# Patient Record
Sex: Male | Born: 1975 | Race: Black or African American | Hispanic: No | Marital: Married | Smoking: Current some day smoker
Health system: Southern US, Community
[De-identification: ages and names within clinical notes are randomized; demographics above are authoritative.]

## PROBLEM LIST (undated history)

## (undated) DIAGNOSIS — M1990 Unspecified osteoarthritis, unspecified site: Secondary | ICD-10-CM

## (undated) DIAGNOSIS — I10 Essential (primary) hypertension: Secondary | ICD-10-CM

## (undated) DIAGNOSIS — N189 Chronic kidney disease, unspecified: Secondary | ICD-10-CM

## (undated) DIAGNOSIS — K219 Gastro-esophageal reflux disease without esophagitis: Secondary | ICD-10-CM

## (undated) DIAGNOSIS — R7303 Prediabetes: Secondary | ICD-10-CM

## (undated) DIAGNOSIS — G4730 Sleep apnea, unspecified: Secondary | ICD-10-CM

## (undated) DIAGNOSIS — B019 Varicella without complication: Secondary | ICD-10-CM

## (undated) HISTORY — PX: OTHER SURGICAL HISTORY: SHX169

## (undated) HISTORY — DX: Varicella without complication: B01.9

## (undated) HISTORY — PX: ESOPHAGOGASTRODUODENOSCOPY: SHX1529

## (undated) HISTORY — DX: Sleep apnea, unspecified: G47.30

## (undated) HISTORY — PX: COLONOSCOPY W/ BIOPSIES: SHX1374

---

## 1998-07-06 ENCOUNTER — Emergency Department (HOSPITAL_COMMUNITY): Admission: EM | Admit: 1998-07-06 | Discharge: 1998-07-06 | Payer: Self-pay | Admitting: Internal Medicine

## 2000-01-05 ENCOUNTER — Emergency Department (HOSPITAL_COMMUNITY): Admission: EM | Admit: 2000-01-05 | Discharge: 2000-01-05 | Payer: Self-pay | Admitting: *Deleted

## 2000-03-01 ENCOUNTER — Emergency Department (HOSPITAL_COMMUNITY): Admission: EM | Admit: 2000-03-01 | Discharge: 2000-03-01 | Payer: Self-pay | Admitting: Emergency Medicine

## 2000-03-01 ENCOUNTER — Encounter: Payer: Self-pay | Admitting: Emergency Medicine

## 2004-06-01 ENCOUNTER — Emergency Department (HOSPITAL_COMMUNITY): Admission: EM | Admit: 2004-06-01 | Discharge: 2004-06-01 | Payer: Self-pay | Admitting: Family Medicine

## 2005-01-01 ENCOUNTER — Ambulatory Visit (HOSPITAL_COMMUNITY): Admission: RE | Admit: 2005-01-01 | Discharge: 2005-01-01 | Payer: Self-pay | Admitting: Family Medicine

## 2005-01-01 ENCOUNTER — Emergency Department (HOSPITAL_COMMUNITY): Admission: EM | Admit: 2005-01-01 | Discharge: 2005-01-01 | Payer: Self-pay | Admitting: Family Medicine

## 2005-06-29 ENCOUNTER — Emergency Department (HOSPITAL_COMMUNITY): Admission: EM | Admit: 2005-06-29 | Discharge: 2005-06-30 | Payer: Self-pay | Admitting: Emergency Medicine

## 2005-10-30 ENCOUNTER — Emergency Department (HOSPITAL_COMMUNITY): Admission: EM | Admit: 2005-10-30 | Discharge: 2005-10-30 | Payer: Self-pay | Admitting: Family Medicine

## 2006-09-30 ENCOUNTER — Emergency Department (HOSPITAL_COMMUNITY): Admission: EM | Admit: 2006-09-30 | Discharge: 2006-09-30 | Payer: Self-pay | Admitting: Family Medicine

## 2006-11-23 ENCOUNTER — Emergency Department (HOSPITAL_COMMUNITY): Admission: EM | Admit: 2006-11-23 | Discharge: 2006-11-23 | Payer: Self-pay | Admitting: Family Medicine

## 2006-12-13 ENCOUNTER — Emergency Department (HOSPITAL_COMMUNITY): Admission: EM | Admit: 2006-12-13 | Discharge: 2006-12-13 | Payer: Self-pay | Admitting: Family Medicine

## 2007-01-08 ENCOUNTER — Ambulatory Visit: Payer: Self-pay | Admitting: Internal Medicine

## 2007-01-09 ENCOUNTER — Ambulatory Visit: Payer: Self-pay | Admitting: *Deleted

## 2007-01-19 ENCOUNTER — Ambulatory Visit: Payer: Self-pay | Admitting: Internal Medicine

## 2007-02-05 ENCOUNTER — Ambulatory Visit: Payer: Self-pay | Admitting: Internal Medicine

## 2007-02-23 ENCOUNTER — Ambulatory Visit: Payer: Self-pay | Admitting: Internal Medicine

## 2007-03-29 ENCOUNTER — Ambulatory Visit: Payer: Self-pay | Admitting: Internal Medicine

## 2007-05-14 ENCOUNTER — Ambulatory Visit: Payer: Self-pay | Admitting: Internal Medicine

## 2007-07-19 ENCOUNTER — Ambulatory Visit: Payer: Self-pay | Admitting: Family Medicine

## 2007-08-07 DIAGNOSIS — I10 Essential (primary) hypertension: Secondary | ICD-10-CM | POA: Insufficient documentation

## 2007-08-07 DIAGNOSIS — R519 Headache, unspecified: Secondary | ICD-10-CM | POA: Insufficient documentation

## 2007-08-07 DIAGNOSIS — R51 Headache: Secondary | ICD-10-CM | POA: Insufficient documentation

## 2007-08-22 ENCOUNTER — Encounter (INDEPENDENT_AMBULATORY_CARE_PROVIDER_SITE_OTHER): Payer: Self-pay | Admitting: *Deleted

## 2007-11-05 ENCOUNTER — Telehealth (INDEPENDENT_AMBULATORY_CARE_PROVIDER_SITE_OTHER): Payer: Self-pay | Admitting: Nurse Practitioner

## 2007-11-06 ENCOUNTER — Emergency Department (HOSPITAL_COMMUNITY): Admission: EM | Admit: 2007-11-06 | Discharge: 2007-11-06 | Payer: Self-pay | Admitting: Emergency Medicine

## 2007-11-20 ENCOUNTER — Emergency Department (HOSPITAL_COMMUNITY): Admission: EM | Admit: 2007-11-20 | Discharge: 2007-11-20 | Payer: Self-pay | Admitting: Emergency Medicine

## 2008-02-26 ENCOUNTER — Emergency Department (HOSPITAL_COMMUNITY): Admission: EM | Admit: 2008-02-26 | Discharge: 2008-02-26 | Payer: Self-pay | Admitting: Family Medicine

## 2008-03-25 ENCOUNTER — Telehealth (INDEPENDENT_AMBULATORY_CARE_PROVIDER_SITE_OTHER): Payer: Self-pay | Admitting: Family Medicine

## 2008-05-12 ENCOUNTER — Emergency Department (HOSPITAL_COMMUNITY): Admission: EM | Admit: 2008-05-12 | Discharge: 2008-05-13 | Payer: Self-pay | Admitting: Emergency Medicine

## 2008-05-13 ENCOUNTER — Emergency Department (HOSPITAL_COMMUNITY): Admission: EM | Admit: 2008-05-13 | Discharge: 2008-05-13 | Payer: Self-pay | Admitting: Emergency Medicine

## 2008-09-25 ENCOUNTER — Telehealth (INDEPENDENT_AMBULATORY_CARE_PROVIDER_SITE_OTHER): Payer: Self-pay | Admitting: Internal Medicine

## 2008-10-07 ENCOUNTER — Ambulatory Visit: Payer: Self-pay | Admitting: Internal Medicine

## 2008-11-11 ENCOUNTER — Ambulatory Visit: Payer: Self-pay | Admitting: Internal Medicine

## 2008-11-25 ENCOUNTER — Ambulatory Visit: Payer: Self-pay | Admitting: Internal Medicine

## 2008-11-25 LAB — CONVERTED CEMR LAB
ALT: 26 units/L (ref 0–53)
AST: 21 units/L (ref 0–37)
Albumin: 4.3 g/dL (ref 3.5–5.2)
Alkaline Phosphatase: 84 units/L (ref 39–117)
BUN: 12 mg/dL (ref 6–23)
Basophils Absolute: 0 10*3/uL (ref 0.0–0.1)
Basophils Relative: 0 % (ref 0–1)
CO2: 23 meq/L (ref 19–32)
Calcium: 9.3 mg/dL (ref 8.4–10.5)
Chloride: 103 meq/L (ref 96–112)
Cholesterol: 165 mg/dL (ref 0–200)
Creatinine, Ser: 0.9 mg/dL (ref 0.40–1.50)
Eosinophils Absolute: 0.2 10*3/uL (ref 0.0–0.7)
Eosinophils Relative: 3 % (ref 0–5)
Glucose, Bld: 89 mg/dL (ref 70–99)
HCT: 41.7 % (ref 39.0–52.0)
HDL: 38 mg/dL — ABNORMAL LOW (ref >39)
Hemoglobin: 13.1 g/dL (ref 13.0–17.0)
LDL Cholesterol: 113 mg/dL — ABNORMAL HIGH (ref 0–99)
Lymphocytes Relative: 39 % (ref 12–46)
Lymphs Abs: 2.7 10*3/uL (ref 0.7–4.0)
MCHC: 31.4 g/dL (ref 30.0–36.0)
MCV: 82.7 fL (ref 78.0–100.0)
Monocytes Absolute: 0.6 10*3/uL (ref 0.1–1.0)
Monocytes Relative: 8 % (ref 3–12)
Neutro Abs: 3.4 10*3/uL (ref 1.7–7.7)
Neutrophils Relative %: 49 % (ref 43–77)
Platelets: 383 10*3/uL (ref 150–400)
Potassium: 3.9 meq/L (ref 3.5–5.3)
RBC: 5.04 M/uL (ref 4.22–5.81)
RDW: 15.3 % (ref 11.5–15.5)
Sodium: 142 meq/L (ref 135–145)
Total Bilirubin: 0.3 mg/dL (ref 0.3–1.2)
Total CHOL/HDL Ratio: 4.3
Total Protein: 7.6 g/dL (ref 6.0–8.3)
Triglycerides: 71 mg/dL (ref <150)
VLDL: 14 mg/dL (ref 0–40)
WBC: 7 10*3/uL (ref 4.0–10.5)

## 2008-12-28 ENCOUNTER — Encounter (INDEPENDENT_AMBULATORY_CARE_PROVIDER_SITE_OTHER): Payer: Self-pay | Admitting: Internal Medicine

## 2008-12-28 DIAGNOSIS — G4733 Obstructive sleep apnea (adult) (pediatric): Secondary | ICD-10-CM | POA: Insufficient documentation

## 2009-01-09 ENCOUNTER — Encounter (INDEPENDENT_AMBULATORY_CARE_PROVIDER_SITE_OTHER): Payer: Self-pay | Admitting: Internal Medicine

## 2009-01-20 ENCOUNTER — Encounter (INDEPENDENT_AMBULATORY_CARE_PROVIDER_SITE_OTHER): Payer: Self-pay | Admitting: Family Medicine

## 2009-01-21 ENCOUNTER — Emergency Department (HOSPITAL_COMMUNITY): Admission: EM | Admit: 2009-01-21 | Discharge: 2009-01-21 | Payer: Self-pay | Admitting: Family Medicine

## 2009-02-02 ENCOUNTER — Telehealth (INDEPENDENT_AMBULATORY_CARE_PROVIDER_SITE_OTHER): Payer: Self-pay | Admitting: Internal Medicine

## 2009-02-26 ENCOUNTER — Ambulatory Visit: Payer: Self-pay | Admitting: Internal Medicine

## 2009-03-04 ENCOUNTER — Encounter (INDEPENDENT_AMBULATORY_CARE_PROVIDER_SITE_OTHER): Payer: Self-pay | Admitting: Internal Medicine

## 2009-03-10 ENCOUNTER — Telehealth (INDEPENDENT_AMBULATORY_CARE_PROVIDER_SITE_OTHER): Payer: Self-pay | Admitting: Internal Medicine

## 2009-05-11 ENCOUNTER — Encounter (INDEPENDENT_AMBULATORY_CARE_PROVIDER_SITE_OTHER): Payer: Self-pay | Admitting: *Deleted

## 2009-08-03 ENCOUNTER — Emergency Department (HOSPITAL_COMMUNITY): Admission: EM | Admit: 2009-08-03 | Discharge: 2009-08-03 | Payer: Self-pay | Admitting: Emergency Medicine

## 2009-08-31 ENCOUNTER — Emergency Department (HOSPITAL_COMMUNITY): Admission: EM | Admit: 2009-08-31 | Discharge: 2009-08-31 | Payer: Self-pay | Admitting: Emergency Medicine

## 2009-11-07 ENCOUNTER — Emergency Department (HOSPITAL_COMMUNITY): Admission: EM | Admit: 2009-11-07 | Discharge: 2009-11-07 | Payer: Self-pay | Admitting: Family Medicine

## 2010-10-30 ENCOUNTER — Emergency Department (HOSPITAL_COMMUNITY)
Admission: EM | Admit: 2010-10-30 | Discharge: 2010-10-30 | Payer: Self-pay | Source: Home / Self Care | Admitting: Family Medicine

## 2010-12-22 ENCOUNTER — Telehealth (INDEPENDENT_AMBULATORY_CARE_PROVIDER_SITE_OTHER): Payer: Self-pay | Admitting: Internal Medicine

## 2010-12-29 ENCOUNTER — Encounter (INDEPENDENT_AMBULATORY_CARE_PROVIDER_SITE_OTHER): Payer: Self-pay | Admitting: Internal Medicine

## 2010-12-30 ENCOUNTER — Telehealth (INDEPENDENT_AMBULATORY_CARE_PROVIDER_SITE_OTHER): Payer: Self-pay | Admitting: Internal Medicine

## 2011-01-06 NOTE — Letter (Signed)
Summary: FAXED RECORDS TO [facility]  FAXED RECORDS TO [facility]   Imported By: Arta Bruce 12/29/2010 15:07:13  _____________________________________________________________________  External Attachment:    Type:   Image     Comment:   External Document

## 2011-01-06 NOTE — Progress Notes (Signed)
Summary: Med refills  Phone Note From Pharmacy   Summary of Call: Request for refills of meds. Please notify [facility]--pt. has not been seen since 02/2009 and was told last February he needed an OV.  Please call pt. and let him know until he had an appt. scheduled, we will not refill his meds. ONce the appt. is made, his meds can be refilled until then. Initial call taken by: Julieanne Manson MD,  December 22, 2010 7:47 AM  Follow-up for Phone Call        [phone] will not accept incoming calls, [phone] no longer works there. [phone] Left message on answer machine for pt. to return call. Pharm notified pt will need to schedule appt. then will refill meds to appt. date. Gaylyn Cheers RN  December 23, 2010 12:52 PM       Additional Follow-up for Phone Call Additional follow up Details #1::        PT CALLED AND IS SCHEDULED ON MARCH 6 @ 11:15 Additional Follow-up by: Armenia Shannon,  December 29, 2010 8:55 AM

## 2011-01-06 NOTE — Progress Notes (Signed)
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 please call Additional Follow-up by: Arta Bruce,  December 30, 2010 9:22 AM    Additional Follow-up for Phone Call Additional follow up Details #2::    Appointment scheduled 02/08/11 - Faxed refills for 30 days with 1 refill for pt. meds per Dr. Renne Crigler instructions on 12/22/10.  Pt. did not answer phone -- unable to leave message.  Dutch Quint RN  December 30, 2010 9:43 AM   Pt. notified of refills, appt. confirmed.  Advised that if he does not show for appointment, no more refills.  Verbalized understanding and agreement.  Dutch Quint RN  December 31, 2010 4:35 PM   Prescriptions: LISINOPRIL 20 MG TABS (LISINOPRIL) 1 tab by mouth daily with Lisinopril/hctz tab  #30 x 1   Entered by:   Dutch Quint RN   Authorized by:   Julieanne Manson MD   Signed by:   Dutch Quint RN on 12/30/2010   Method used:   Faxed to ...       [facility] - Pharmac (retail)       [address] [place], [place]  [address]       Ph: [phone]       Fax: [phone]   RxID:   [id number] NORVASC 10 MG  TABS (AMLODIPINE BESYLATE) Take 1 tablet by mouth once a day **needs office visit**  #30 x 1   Entered by:   Dutch Quint RN   Authorized by:   Julieanne Manson MD   Signed by:   Dutch Quint RN on 12/30/2010   Method used:   Faxed to ...       [facility] - Pharmac (retail)       [address] [place], [place]  [address]       Ph: [phone]       Fax:  [phone]   RxID:   [id number] LISINOPRIL-HYDROCHLOROTHIAZIDE 20-25 MG  TABS (LISINOPRIL-HYDROCHLOROTHIAZIDE) Take 1 tablet by mouth once a day with another 20 mg of Lisinopril**needs an office visit**  #30 x 1   Entered by:   Dutch Quint RN   Authorized by:   Julieanne Manson MD   Signed by:   Dutch Quint RN on 12/30/2010   Method used:   Faxed to ...       [facility] - Pharmac (retail)       [address] [place], [place]  [address]       Ph: [phone]       Fax: [phone]   RxID:   [id number]

## 2011-02-08 ENCOUNTER — Encounter (INDEPENDENT_AMBULATORY_CARE_PROVIDER_SITE_OTHER): Payer: Self-pay | Admitting: Internal Medicine

## 2011-02-08 ENCOUNTER — Telehealth (INDEPENDENT_AMBULATORY_CARE_PROVIDER_SITE_OTHER): Payer: Self-pay | Admitting: Internal Medicine

## 2011-02-08 ENCOUNTER — Encounter: Payer: Self-pay | Admitting: Internal Medicine

## 2011-02-08 DIAGNOSIS — R1310 Dysphagia, unspecified: Secondary | ICD-10-CM | POA: Insufficient documentation

## 2011-02-15 LAB — POCT RAPID STREP A (OFFICE): Streptococcus, Group A Screen (Direct): NEGATIVE

## 2011-02-15 NOTE — Progress Notes (Signed)
Summary: Sleep study referral  Phone Note Outgoing Call   Summary of Call: Nora--sleep study--regular please, not split night. Initial call taken by: Julieanne Manson MD,  February 08, 2011 12:30 PM  Follow-up for Phone Call        SENT REFERRAL TO [facility] # [phone] WAITING FOR AN APPT  Follow-up by: Cheryll Dessert,  February 10, 2011 5:20 PM

## 2011-02-15 NOTE — Assessment & Plan Note (Signed)
Summary: OV   Vital Signs:  Patient profile:   35 year old male Weight:      316.7 pounds BSA:     2.59 Temp:     98.1 degrees F oral Pulse rate:   97 / minute Pulse rhythm:   regular Resp:     20 per minute BP sitting:   157 / 94  (left arm) Cuff size:   large  Vitals Entered By: Gaylyn Cheers RN (February 08, 2011 11:30 AM) CC: F/U BP, having problems swallowing, food gets caught in throat has occasional vomiting because food will not go down Pain Assessment Patient in pain? no       Does patient need assistance? Functional Status Self care Ambulation Normal   CC:  F/U BP, having problems swallowing, and food gets caught in throat has occasional vomiting because food will not go down.  History of Present Illness: 1.  Hypertension:  was not taking medications the way he was supposed to.  On and off meds--runs out of them and then does not fill for a while.  Interested in doing better--states "just laziness".  Not exercising.  Not eating the way he should with work schedule.    2.  Snoring and concerns for sleep apnea:  Never went for sleep study.  Has gained weight since last here.    3.  Dysphagia:  Past 2 years.  First thing in morning with dry throat in particular, food gets caught.  Needs to drink fluid to move along.  Has had to vomit food back up and chew up a bit more.  Generally occurs with meat.  Occasional acid in throat--but rare.  Rare heartburn symptoms.  Does have a bad taste in mouth every morning.  Nonsmoker--quit in 1997.  Rare alcohol.  Was taking Goody Powders regularly for headaches when his bp was high.  Used two times a day.  Stopped after back on some of bp meds.  Has used Ibuprofen as well.  Drinks sweet tea 3 times weekly.  Sodas 3-4 times weekly.  Eats a lot of onions, some tomatoes.  Sometiimes lies down after eating.    Current Medications (verified): 1)  Lisinopril-Hydrochlorothiazide 20-25 Mg  Tabs (Lisinopril-Hydrochlorothiazide) .... Take 1 Tablet  By Mouth Once A Day With Another 20 Mg of Lisinopril**needs An Office Visit** 2)  Norvasc 10 Mg  Tabs (Amlodipine Besylate) .... Take 1 Tablet By Mouth Once A Day **needs Office Visit** 3)  Lisinopril 20 Mg Tabs (Lisinopril) .Marland Kitchen.. 1 Tab By Mouth Daily With Lisinopril/hctz Tab  Allergies (verified): No Known Drug Allergies  Social History: Reviewed history from 02/26/2009 and no changes required. 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 Lives at home with mother. Recently divorced--kids live with Xwife. Very involved with children Tobacco:  never Alcohol use-yes--rare mixed drink Drug use-no--MJ in youth.  Nothing in many years.  Stopped 1997  Physical Exam  General:  Obese, NAD Lungs:  Normal respiratory effort, chest expands symmetrically. Lungs are clear to auscultation, no crackles or wheezes. Heart:  Normal rate and regular rhythm. S1 and S2 normal without gallop, murmur, click, rub or other extra sounds.  Radail pulses normal and equal Abdomen:  soft, non-tender, normal bowel sounds, no hepatomegaly, and no splenomegaly.     Impression & Recommendations:  Problem # 1:  SNORING (ICD-786.09)  Rerefer for sleep study His updated medication list for this problem includes:    Lisinopril-hydrochlorothiazide 20-25 Mg Tabs (Lisinopril-hydrochlorothiazide) .Marland Kitchen... Take 1 tablet by mouth once a  day with another 20 mg of lisinopril**needs an office visit**    Lisinopril 20 Mg Tabs (Lisinopril) .Marland Kitchen... 1 tab by mouth daily with lisinopril/hctz tab  Orders: Sleep Disorder Referral (Sleep Disorder)  Problem # 2:  DYSPHAGIA UNSPECIFIED (ICD-787.20) Start Ranitidine GERD precautions discussed  Problem # 3:  OBESITY (ICD-278.00) Encouraged lifestyle changes  Problem # 4:  HYPERTENSION (ICD-401.9) Restart meds regularly--check labs at follow up His updated medication list for this problem includes:    Lisinopril-hydrochlorothiazide 20-25 Mg Tabs (Lisinopril-hydrochlorothiazide) .Marland Kitchen... Take 1  tablet by mouth once a day with another 20 mg of lisinopril**needs an office visit**    Norvasc 10 Mg Tabs (Amlodipine besylate) .Marland Kitchen... Take 1 tablet by mouth once a day **needs office visit**    Lisinopril 20 Mg Tabs (Lisinopril) .Marland Kitchen... 1 tab by mouth daily with lisinopril/hctz tab  Complete Medication List: 1)  Lisinopril-hydrochlorothiazide 20-25 Mg Tabs (Lisinopril-hydrochlorothiazide) .... Take 1 tablet by mouth once a day with another 20 mg of lisinopril**needs an office visit** 2)  Norvasc 10 Mg Tabs (Amlodipine besylate) .... Take 1 tablet by mouth once a day **needs office visit** 3)  Lisinopril 20 Mg Tabs (Lisinopril) .Marland Kitchen.. 1 tab by mouth daily with lisinopril/hctz tab 4)  Ranitidine Hcl 150 Mg Tabs (Ranitidine hcl) .Marland Kitchen.. 1 tab by mouth two times a day 1/2 hour before meals  Patient Instructions: 1)  Follow up with Dr. Delrae Alfred in 2 months --bp and dysphagia, snoring 2)  Elevate Head of bed Prescriptions: RANITIDINE HCL 150 MG TABS (RANITIDINE HCL) 1 tab by mouth two times a day 1/2 hour before meals  #60 x 3   Entered and Authorized by:   Julieanne Manson MD   Signed by:   Julieanne Manson MD on 02/08/2011   Method used:   Faxed to ...       [facility] - Pharmac (retail)       [address] [place], [place]  [address]       Ph: [phone]       Fax: [phone]   RxID:   [id number] LISINOPRIL 20 MG TABS (LISINOPRIL) 1 tab by mouth daily with Lisinopril/hctz tab  #30 x 11   Entered and Authorized by:   Julieanne Manson MD   Signed by:   Julieanne Manson MD on 02/08/2011   Method used:   Faxed to ...       [facility] - Pharmac (retail)       [address] [place], [place]  [address]       Ph: [phone]       Fax: [phone]   RxID:   [id number] NORVASC 10 MG  TABS (AMLODIPINE BESYLATE) Take 1 tablet by mouth once a day **needs office visit**  #30 x 11   Entered and Authorized  by:   Julieanne Manson MD   Signed by:   Julieanne Manson MD on 02/08/2011   Method used:   Faxed to ...       [facility] - Pharmac (retail)       [address] [place], [place]  [address]       Ph: [phone]       Fax: [phone]   RxID:   [id number] LISINOPRIL-HYDROCHLOROTHIAZIDE 20-25 MG  TABS (LISINOPRIL-HYDROCHLOROTHIAZIDE) Take 1 tablet by mouth once a day with another 20 mg of Lisinopril**needs an office visit**  #  30 x 11   Entered and Authorized by:   Julieanne Manson MD   Signed by:   Julieanne Manson MD on 02/08/2011   Method used:   Faxed to ...       [facility] - Pharmac (retail)       [address] [place], [place]  [address]       Ph: [phone]       Fax: [phone]   RxID:   [id number]    Orders Added: 1)  Est. Patient Level IV [[address]] 2)  Sleep Disorder Referral [Sleep Disorder]

## 2011-02-21 ENCOUNTER — Ambulatory Visit (HOSPITAL_BASED_OUTPATIENT_CLINIC_OR_DEPARTMENT_OTHER): Payer: Self-pay

## 2011-03-11 LAB — POCT RAPID STREP A (OFFICE): Streptococcus, Group A Screen (Direct): NEGATIVE

## 2011-03-11 LAB — STREP A DNA PROBE: Group A Strep Probe: NEGATIVE

## 2011-03-22 LAB — GC/CHLAMYDIA PROBE AMP, GENITAL: GC Probe Amp, Genital: NEGATIVE

## 2011-03-25 ENCOUNTER — Ambulatory Visit (HOSPITAL_BASED_OUTPATIENT_CLINIC_OR_DEPARTMENT_OTHER): Payer: Self-pay | Attending: Internal Medicine

## 2011-03-25 DIAGNOSIS — G4733 Obstructive sleep apnea (adult) (pediatric): Secondary | ICD-10-CM | POA: Insufficient documentation

## 2011-03-26 DIAGNOSIS — G4733 Obstructive sleep apnea (adult) (pediatric): Secondary | ICD-10-CM

## 2011-03-26 DIAGNOSIS — G4737 Central sleep apnea in conditions classified elsewhere: Secondary | ICD-10-CM

## 2011-03-28 NOTE — Procedures (Signed)
NAME:  Robert Cruz, CASPERS NO.:  [id number]  MEDICAL RECORD NO.:  [id number]         PATIENT TYPE:  OUT  LOCATION:  SLEEP CENTER                 FACILITY:  [facility]  PHYSICIAN:  Clinton D. Maple Hudson, MD, FCCP, FACPDATE OF BIRTH:  08/12/76  DATE OF STUDY:  03/25/2011                           NOCTURNAL POLYSOMNOGRAM  REFERRING PHYSICIAN:  Marcene Duos, M.D.  INDICATION FOR STUDY:  Hypersomnia with sleep apnea.  EPWORTH SLEEPINESS SCORE:  5/24.  BMI 36.9.  Weight 280 pounds.  Height 73 inches.  Neck 18.5 inches.  MEDICATIONS:  Home medications are charted and reviewed.  SLEEP ARCHITECTURE:  Split study protocol.  During the diagnostic phase, total sleep time 119.5 minutes with sleep efficiency 93%.  Stage I was 5.4%, stage II 85.4%, stage III absent, REM 9.2% of total sleep time. Sleep latency 3 minutes, REM latency 80.5 minutes, awake after sleep onset 6.5 minutes, arousal index 28.1.  BEDTIME MEDICATION:  None.  RESPIRATORY DATA:  Split study protocol.  Apnea-hypopnea index (AHI) 104.4 per hour.  A total of 208 events was scored including 83 obstructive apneas, 4 central apneas, 5 mixed apneas, 116 hypopneas. Events were not positional.  REM AHI 109.1 per hour.  CPAP was titrated to 20 CWP, AHI 0 per hour.  He wore a medium ResMed Mirage Quattro full- face mask with heated humidifier.  OXYGEN DATA:  Moderately loud snoring before CPAP with oxygen desaturation to a nadir of 66% on room air.  With CPAP titration, mean oxygen saturation held 95.4% on room air and snoring was prevented.  CARDIAC DATA:  Normal sinus rhythm.  MOVEMENT-PARASOMNIA:  No significant movement disturbance.  Bathroom x1.  IMPRESSIONS-RECOMMENDATIONS: 1. Unremarkable sleep architecture for sleep center environment.  The     patient requested to end the study at 3:30 a.m., so he could go to     work.  This can be discussed as part of his education about good     sleep hygiene. 2.  Severe obstructive sleep apnea/hypopnea syndrome, AHI 104.4 per     hour with non-positional events, moderate snoring and oxygen     desaturation to a nadir of 66% on room air. 3. Successful continuous positive airway pressure titration to 20 CWP,     AHI 0 per hour.  He wore a medium     ResMed Mirage Quattro full-face mask with heated humidifier.     Snoring was prevented and oxygenation normalized.     Clinton D. Maple Hudson, MD, [facility], FACP Diplomate, Biomedical engineer of Sleep Medicine Electronically Signed    CDY/MEDQ  D:  03/26/2011 12:05:44  T:  03/26/2011 22:38:59  Job:  [id number]

## 2011-04-22 NOTE — Consult Note (Signed)
[place]  Patient:    Robert Cruz, Robert Cruz                     MRN: [id number] Adm. Date:  [id number] Disc. Date: [id number] Attending:  Annamarie Dawley Dictator:   [id number]                          Consultation Report  PHYSICIAN REQUESTING CONSULTATION:  Nicoletta Dress. Colon Branch, M.D.  REASON FOR CONSULTATION:  [name] is a very pleasant 35 year old right-hand dominant male who sustained a laceration to the dorsal aspect of his  left hand earlier today March 01, 2000 when he put his hand through a pane of window glass and sustained a laceration to the dorsal aspect of his nondominant  left hand with obvious tendon involvement.  Patient presented to the ER with open wound bleeding and inability to actively extend the long and ring fingers on his nondominant left hand.  He is an otherwise healthy 35 year old male.  ALLERGIES:  No known drug allergies.  MEDICATIONS:  None.  PAST SURGICAL HISTORY:  Noncontributory.  SOCIAL HISTORY:  Noncontributory.  PAST MEDICAL HISTORY:  Noncontributory.  PHYSICAL EXAMINATION:  GENERAL:  He is a well-developed, well-nourished male, pleasant, alert and oriented x 3.  HEENT:  Normocephalic, atraumatic.  HEART:  Regular rate and rhythm.  ABDOMEN:  Bowel sounds positive.  Soft and nontender.  LUNGS:  Clear.  EXTREMITIES:  Examination of his left hand reveals him to have a complex laceration to the dorsal aspect of his hand with exposed extensor tendon and loss of active extension at the long and ring fingers.  X-rays were negative for foreign body.  IMPRESSION:  A 35 year old male status post an accidental laceration to the dorsal aspect of his left hand with obvious extensor tendon damage.  Patient was placed supine on the stretcher in the procedure room and his left upper extremity was prepped and draped in the usual sterile fashion.  Once this was done anesthesia was obtained using 2% plain  Lidocaine and a field block over the dorsal aspect of his hand.  Once adequate anesthesia was obtained the laceration was extended proximally for 3.5 cm, thus exposing an obvious laceration to the extensor digitorum communis tendons to the long and ring fingers.  These were debrided of clot and nonviable material.  The wound was thoroughly irrigated and then the tendon ends x 2 repaired using 4-0 Ethibond and a horizontal mattress suture x 2 for each tendon as well as a running 6-0 Prolene epitendinous stitch.  Once this was completed the wound was thoroughly irrigated.  The skin was then closed with 4-0 nylon and a sterile dressing with xeroform 4 x 4s and a volar paddle type splint was applied.  The patient tolerated procedure well and was discharged from the emergency department with Vicodin for pain # 20 as well as Keflex 500 mg q.i.d. for a week for antibiotic prophylaxis.  He is to follow up in my office on March 08, 2000. DD:  03/01/00 TD:  03/01/00 Job: [id number]

## 2011-08-11 ENCOUNTER — Inpatient Hospital Stay (INDEPENDENT_AMBULATORY_CARE_PROVIDER_SITE_OTHER)
Admission: RE | Admit: 2011-08-11 | Discharge: 2011-08-11 | Disposition: A | Discharge status: Home / Self Care | Payer: Self-pay | Source: Ambulatory Visit | Attending: Family Medicine | Admitting: Family Medicine

## 2011-08-11 DIAGNOSIS — R51 Headache: Secondary | ICD-10-CM

## 2011-08-11 DIAGNOSIS — I10 Essential (primary) hypertension: Secondary | ICD-10-CM

## 2011-08-29 LAB — URINE CULTURE: Colony Count: 1000

## 2011-08-29 LAB — POCT URINALYSIS DIP (DEVICE)
Bilirubin Urine: NEGATIVE
Glucose, UA: NEGATIVE
Hgb urine dipstick: NEGATIVE
Nitrite: NEGATIVE

## 2011-09-01 LAB — CBC
MCHC: 34.5
MCV: 82.1
Platelets: 385
RDW: 15.6 — ABNORMAL HIGH
WBC: 12.7 — ABNORMAL HIGH

## 2011-09-01 LAB — DIFFERENTIAL
Basophils Relative: 2 — ABNORMAL HIGH
Eosinophils Absolute: 0.3
Neutrophils Relative %: 61

## 2011-09-01 LAB — POCT I-STAT, CHEM 8
Creatinine, Ser: 1.3
Glucose, Bld: 121 — ABNORMAL HIGH
Hemoglobin: 15.6
TCO2: 26

## 2011-09-01 LAB — POCT CARDIAC MARKERS
Myoglobin, poc: 41.8
Operator id: 277751

## 2012-03-03 ENCOUNTER — Encounter (HOSPITAL_COMMUNITY): Payer: Self-pay | Admitting: *Deleted

## 2012-03-03 ENCOUNTER — Other Ambulatory Visit: Payer: Self-pay

## 2012-03-03 ENCOUNTER — Emergency Department (HOSPITAL_COMMUNITY): Payer: Self-pay

## 2012-03-03 ENCOUNTER — Emergency Department (HOSPITAL_COMMUNITY)
Admission: EM | Admit: 2012-03-03 | Discharge: 2012-03-03 | Disposition: A | Discharge status: Home / Self Care | Payer: Self-pay | Attending: Emergency Medicine | Admitting: Emergency Medicine

## 2012-03-03 DIAGNOSIS — I10 Essential (primary) hypertension: Secondary | ICD-10-CM | POA: Insufficient documentation

## 2012-03-03 DIAGNOSIS — R0602 Shortness of breath: Secondary | ICD-10-CM | POA: Insufficient documentation

## 2012-03-03 DIAGNOSIS — R0609 Other forms of dyspnea: Secondary | ICD-10-CM | POA: Insufficient documentation

## 2012-03-03 DIAGNOSIS — R0989 Other specified symptoms and signs involving the circulatory and respiratory systems: Secondary | ICD-10-CM | POA: Insufficient documentation

## 2012-03-03 DIAGNOSIS — R0600 Dyspnea, unspecified: Secondary | ICD-10-CM

## 2012-03-03 HISTORY — DX: Essential (primary) hypertension: I10

## 2012-03-03 LAB — DIFFERENTIAL
Basophils Relative: 1 % (ref 0–1)
Eosinophils Absolute: 0.3 10*3/uL (ref 0.0–0.7)
Eosinophils Relative: 5 % (ref 0–5)
Monocytes Relative: 7 % (ref 3–12)
Neutrophils Relative %: 50 % (ref 43–77)

## 2012-03-03 LAB — POCT I-STAT TROPONIN I: Troponin i, poc: 0 ng/mL (ref 0.00–0.08)

## 2012-03-03 LAB — COMPREHENSIVE METABOLIC PANEL
ALT: 12 U/L (ref 0–53)
Calcium: 9.3 mg/dL (ref 8.4–10.5)
Creatinine, Ser: 0.89 mg/dL (ref 0.50–1.35)
GFR calc Af Amer: >90 mL/min (ref >90)
GFR calc non Af Amer: >90 mL/min (ref >90)
Glucose, Bld: 116 mg/dL — ABNORMAL HIGH (ref 70–99)
Sodium: 138 mEq/L (ref 135–145)
Total Protein: 7 g/dL (ref 6.0–8.3)

## 2012-03-03 LAB — CBC
Hemoglobin: 12.1 g/dL — ABNORMAL LOW (ref 13.0–17.0)
MCH: 26.7 pg (ref 26.0–34.0)
MCHC: 32.7 g/dL (ref 30.0–36.0)
MCV: 81.7 fL (ref 78.0–100.0)
Platelets: 290 10*3/uL (ref 150–400)

## 2012-03-03 LAB — PRO B NATRIURETIC PEPTIDE: Pro B Natriuretic peptide (BNP): 39.5 pg/mL (ref 0–125)

## 2012-03-03 MED ORDER — ALBUTEROL SULFATE HFA 108 (90 BASE) MCG/ACT IN AERS
2.0000 | INHALATION_SPRAY | RESPIRATORY_TRACT | Status: DC
  Administered 2012-03-03: 2 via RESPIRATORY_TRACT
  Filled 2012-03-03: qty 6.7

## 2012-03-03 NOTE — ED Provider Notes (Signed)
History     CSN: [id number]  Arrival date & time 03/03/12  4696   First MD Initiated Contact with Patient 03/03/12 [id number]      Chief Complaint  Patient presents with  . Shortness of Breath    (Consider location/radiation/quality/duration/timing/severity/associated sxs/prior treatment) Patient is a 36 y.o. male presenting with shortness of breath. The history is provided by the patient.  Shortness of Breath  Associated symptoms include shortness of breath.   patient here with shortness of breath that started last night acutely prior to going to bed. Symptoms have been persistent and not associated with cough, fever. No chest pain or pressure. Nothing makes the symptoms better or worse. Denies any recent travel or leg pain or swelling. No prior history of same. Denies any bloody stools. He doesn't note palpitations. No prior history of same. No treatments prior to arrival  Past Medical History  Diagnosis Date  . Hypertension     History reviewed. No pertinent past surgical history.  History reviewed. No pertinent family history.  History  Substance Use Topics  . Smoking status: Not on file  . Smokeless tobacco: Not on file  . Alcohol Use:       Review of Systems  Respiratory: Positive for shortness of breath.   All other systems reviewed and are negative.    Allergies  Review of patient's allergies indicates no known allergies.  Home Medications   Current Outpatient Rx  Name Route Sig Dispense Refill  . AMLODIPINE BESYLATE 10 MG PO TABS Oral Take 10 mg by mouth daily.    Marland Kitchen HYDROCHLOROTHIAZIDE 25 MG PO TABS Oral Take 25 mg by mouth daily.    Marland Kitchen LISINOPRIL 20 MG PO TABS Oral Take 20 mg by mouth daily.      BP 146/88  Pulse 65  Temp(Src) 98.6 F (37 C) (Oral)  Resp 12  SpO2 99%  Physical Exam  Nursing note and vitals reviewed. Constitutional: He is oriented to person, place, and time. He appears well-developed and well-nourished.  Non-toxic appearance. No  distress.  HENT:  Head: Normocephalic and atraumatic.  Eyes: Conjunctivae, EOM and lids are normal. Pupils are equal, round, and reactive to light.  Neck: Normal range of motion. Neck supple. No tracheal deviation present. No mass present.  Cardiovascular: Normal rate, regular rhythm and normal heart sounds.  Exam reveals no gallop.   No murmur heard. Pulmonary/Chest: Effort normal and breath sounds normal. No stridor. No respiratory distress. He has no decreased breath sounds. He has no wheezes. He has no rhonchi. He has no rales.  Abdominal: Soft. Normal appearance and bowel sounds are normal. He exhibits no distension. There is no tenderness. There is no rebound and no CVA tenderness.  Musculoskeletal: Normal range of motion. He exhibits no edema and no tenderness.  Neurological: He is alert and oriented to person, place, and time. He has normal strength. No cranial nerve deficit or sensory deficit. GCS eye subscore is 4. GCS verbal subscore is 5. GCS motor subscore is 6.  Skin: Skin is warm and dry. No abrasion and no rash noted.  Psychiatric: He has a normal mood and affect. His speech is normal and behavior is normal.    ED Course  Procedures (including critical care time)   Labs Reviewed  CBC  DIFFERENTIAL  D-DIMER, QUANTITATIVE  COMPREHENSIVE METABOLIC PANEL  PRO B NATRIURETIC PEPTIDE   No results found.   No diagnosis found.    MDM  Labs and xrays reviewed, suspect bronchspasm, no  concern for acs or pe--will give bronchodilator        Toy Baker, MD 03/03/12 1045

## 2012-03-03 NOTE — ED Notes (Signed)
Per EMS - pt began having shortness of breath approx 0200 - pt noticed increased dyspnea in closed spaces and when lying flat. Pt noted a period of diaphoresis as well, pt denies any chest pain or tightness.

## 2012-03-03 NOTE — ED Notes (Signed)
Pt also given nitro and ASA by EMS en route.

## 2012-03-03 NOTE — ED Notes (Signed)
Pt reports shortness of breath and palpitations that began approx 1200am while at rest - pt felt anxious and finally called the ambulance. Pt reports increased shortness of breath when he attempted to lie flat. Pt denies n/v or any pain at present.

## 2012-03-28 ENCOUNTER — Emergency Department (HOSPITAL_BASED_OUTPATIENT_CLINIC_OR_DEPARTMENT_OTHER)
Admission: EM | Admit: 2012-03-28 | Discharge: 2012-03-28 | Disposition: A | Discharge status: Home / Self Care | Payer: Self-pay | Attending: Emergency Medicine | Admitting: Emergency Medicine

## 2012-03-28 ENCOUNTER — Encounter (HOSPITAL_BASED_OUTPATIENT_CLINIC_OR_DEPARTMENT_OTHER): Payer: Self-pay | Admitting: *Deleted

## 2012-03-28 ENCOUNTER — Emergency Department (INDEPENDENT_AMBULATORY_CARE_PROVIDER_SITE_OTHER): Payer: Self-pay

## 2012-03-28 DIAGNOSIS — R0602 Shortness of breath: Secondary | ICD-10-CM

## 2012-03-28 DIAGNOSIS — R0609 Other forms of dyspnea: Secondary | ICD-10-CM | POA: Insufficient documentation

## 2012-03-28 DIAGNOSIS — R05 Cough: Secondary | ICD-10-CM

## 2012-03-28 DIAGNOSIS — R0989 Other specified symptoms and signs involving the circulatory and respiratory systems: Secondary | ICD-10-CM | POA: Insufficient documentation

## 2012-03-28 DIAGNOSIS — R059 Cough, unspecified: Secondary | ICD-10-CM

## 2012-03-28 DIAGNOSIS — R0600 Dyspnea, unspecified: Secondary | ICD-10-CM

## 2012-03-28 DIAGNOSIS — I10 Essential (primary) hypertension: Secondary | ICD-10-CM | POA: Insufficient documentation

## 2012-03-28 LAB — CBC
HCT: 40.5 % (ref 39.0–52.0)
MCHC: 34.8 g/dL (ref 30.0–36.0)
Platelets: 297 10*3/uL (ref 150–400)
RDW: 14.9 % (ref 11.5–15.5)
WBC: 7.1 10*3/uL (ref 4.0–10.5)

## 2012-03-28 LAB — BASIC METABOLIC PANEL
BUN: 12 mg/dL (ref 6–23)
CO2: 28 mEq/L (ref 19–32)
Calcium: 9.4 mg/dL (ref 8.4–10.5)
Creatinine, Ser: 1 mg/dL (ref 0.50–1.35)

## 2012-03-28 LAB — DIFFERENTIAL
Basophils Absolute: 0 10*3/uL (ref 0.0–0.1)
Basophils Relative: 0 % (ref 0–1)
Lymphocytes Relative: 37 % (ref 12–46)
Neutro Abs: 3.8 10*3/uL (ref 1.7–7.7)
Neutrophils Relative %: 53 % (ref 43–77)

## 2012-03-28 LAB — D-DIMER, QUANTITATIVE: D-Dimer, Quant: <0.22 ug/mL-FEU (ref 0.00–0.48)

## 2012-03-28 MED ORDER — ALBUTEROL SULFATE HFA 108 (90 BASE) MCG/ACT IN AERS: 1.0000 | INHALATION_SPRAY | Freq: Four times a day (QID) | RESPIRATORY_TRACT | Status: DC | PRN

## 2012-03-28 MED ORDER — ALBUTEROL SULFATE (5 MG/ML) 0.5% IN NEBU
5.0000 mg | INHALATION_SOLUTION | Freq: Once | RESPIRATORY_TRACT | Status: AC
  Administered 2012-03-28: 5 mg via RESPIRATORY_TRACT
  Filled 2012-03-28: qty 1

## 2012-03-28 NOTE — ED Provider Notes (Signed)
History     CSN: [id number]  Arrival date & time 03/28/12  1440   First MD Initiated Contact with Patient 03/28/12 1508      Chief Complaint  Patient presents with  . Shortness of Breath  . Hypertension    (Consider location/radiation/quality/duration/timing/severity/associated sxs/prior treatment) Patient is a 36 y.o. male presenting with shortness of breath. The history is provided by the patient. No language interpreter was used.  Shortness of Breath  The current episode started today. The onset was gradual. The problem has been gradually worsening. The problem is moderate. The symptoms are relieved by nothing. The symptoms are aggravated by nothing. Associated symptoms include shortness of breath. He has not inhaled smoke recently. He has had no prior steroid use. He has had no prior hospitalizations. He has had no prior ICU admissions. He has had no prior intubations.  Pt complains of being short of breath.  Pt reports he works around a lot of dust and fumes.  Pt wears a mask but it does not help with fumes  Past Medical History  Diagnosis Date  . Hypertension     History reviewed. No pertinent past surgical history.  History reviewed. No pertinent family history.  History  Substance Use Topics  . Smoking status: Never Smoker   . Smokeless tobacco: Not on file  . Alcohol Use: No      Review of Systems  Respiratory: Positive for shortness of breath.   All other systems reviewed and are negative.    Allergies  Review of patient's allergies indicates no known allergies.  Home Medications   Current Outpatient Rx  Name Route Sig Dispense Refill  . AMLODIPINE BESYLATE 10 MG PO TABS Oral Take 10 mg by mouth daily.    Marland Kitchen HYDROCHLOROTHIAZIDE 25 MG PO TABS Oral Take 25 mg by mouth daily.    Marland Kitchen LISINOPRIL 20 MG PO TABS Oral Take 20 mg by mouth daily.      BP 160/92  Pulse 79  Temp(Src) 98.5 F (36.9 C) (Oral)  Resp 18  Ht 6\' 1"  (1.854 m)  Wt 295 lb (133.811 kg)   BMI 38.92 kg/m2  SpO2 100%  Physical Exam  Nursing note and vitals reviewed. Constitutional: He is oriented to person, place, and time. He appears well-developed and well-nourished.  HENT:  Head: Normocephalic and atraumatic.  Right Ear: External ear normal.  Left Ear: External ear normal.  Nose: Nose normal.  Mouth/Throat: Oropharynx is clear and moist.  Eyes: Conjunctivae and EOM are normal. Pupils are equal, round, and reactive to light.  Neck: Normal range of motion. Neck supple.  Cardiovascular: Normal rate and normal heart sounds.   Pulmonary/Chest: Effort normal.  Abdominal: Soft.  Musculoskeletal: Normal range of motion.  Neurological: He is alert and oriented to person, place, and time. He has normal reflexes.  Skin: Skin is warm.  Psychiatric: He has a normal mood and affect.    ED Course  Procedures (including critical care time)  Labs Reviewed - No data to display Dg Chest 2 View  03/28/2012  *RADIOLOGY REPORT*  Clinical Data: Shortness of breath and cough.  CHEST - 2 VIEW  Comparison: PA and lateral chest 03/03/2012.  Findings: Lungs are clear.  Heart size is normal.  No pneumothorax or pleural fluid.  No focal bony abnormality.  IMPRESSION: No acute disease.  Original Report Authenticated By: Bernadene Bell. Maricela Curet, M.D.   Results for orders placed during the hospital encounter of 03/28/12  CBC  Component Value Range   WBC 7.1  4.0 - 10.5 (K/uL)   RBC 5.07  4.22 - 5.81 (MIL/uL)   Hemoglobin 14.1  13.0 - 17.0 (g/dL)   HCT 16.1  09.6 - 04.5 (%)   MCV 79.9  78.0 - 100.0 (fL)   MCH 27.8  26.0 - 34.0 (pg)   MCHC 34.8  30.0 - 36.0 (g/dL)   RDW 40.9  81.1 - 91.4 (%)   Platelets 297  150 - 400 (K/uL)  DIFFERENTIAL      Component Value Range   Neutrophils Relative 53  43 - 77 (%)   Neutro Abs 3.8  1.7 - 7.7 (K/uL)   Lymphocytes Relative 37  12 - 46 (%)   Lymphs Abs 2.6  0.7 - 4.0 (K/uL)   Monocytes Relative 7  3 - 12 (%)   Monocytes Absolute 0.5  0.1 - 1.0  (K/uL)   Eosinophils Relative 3  0 - 5 (%)   Eosinophils Absolute 0.2  0.0 - 0.7 (K/uL)   Basophils Relative 0  0 - 1 (%)   Basophils Absolute 0.0  0.0 - 0.1 (K/uL)  BASIC METABOLIC PANEL      Component Value Range   Sodium 139  135 - 145 (mEq/L)   Potassium 3.6  3.5 - 5.1 (mEq/L)   Chloride 101  96 - 112 (mEq/L)   CO2 28  19 - 32 (mEq/L)   Glucose, Bld 112 (*) 70 - 99 (mg/dL)   BUN 12  6 - 23 (mg/dL)   Creatinine, Ser 7.82  0.50 - 1.35 (mg/dL)   Calcium 9.4  8.4 - 95.6 (mg/dL)   GFR calc non Af Amer >90  >90 (mL/min)   GFR calc Af Amer >90  >90 (mL/min)  D-DIMER, QUANTITATIVE      Component Value Range   D-Dimer, Quant <0.22  0.00 - 0.48 (ug/mL-FEU)  TROPONIN I      Component Value Range   Troponin I <0.30  <0.30 (ng/mL)   Dg Chest 2 View  03/28/2012  *RADIOLOGY REPORT*  Clinical Data: Shortness of breath and cough.  CHEST - 2 VIEW  Comparison: PA and lateral chest 03/03/2012.  Findings: Lungs are clear.  Heart size is normal.  No pneumothorax or pleural fluid.  No focal bony abnormality.  IMPRESSION: No acute disease.  Original Report Authenticated By: Bernadene Bell. Maricela Curet, M.D.   Dg Chest 2 View  03/03/2012  *RADIOLOGY REPORT*  Clinical Data: Chest pressure and shortness of breath, hypertension  CHEST - 2 VIEW  Comparison: None.  Findings: The cardiac silhouette, mediastinum, pulmonary vasculature are within normal limits.  Both lungs are clear. There is no acute bony abnormality.  IMPRESSION: There is no evidence of acute cardiac or pulmonary process.  Original Report Authenticated By: Brandon Melnick, M.D.     No diagnosis found.    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        Lonia Skinner [place], [place] 03/28/12 1952

## 2012-03-28 NOTE — ED Notes (Signed)
Pt brought in by EMS from home c/o SOB , increased BP , denies CP.

## 2012-03-28 NOTE — ED Provider Notes (Signed)
3:02 PM  Date: 03/28/2012  Rate: 81  Rhythm: normal sinus rhythm and sinus arrhythmia  QRS Axis: normal  Intervals: normal PQRS:  Left atrial abnormality  ST/T Wave abnormalities: nonspecific T wave changes  Conduction Disutrbances:none  Narrative Interpretation: Abnormal EKG.  Old EKG Reviewed: none available    Carleene Cooper III, MD 03/28/12 1504

## 2012-03-29 NOTE — ED Provider Notes (Signed)
Medical screening examination/treatment/procedure(s) were performed by non-physician practitioner and as supervising physician I was immediately available for consultation/collaboration.   Carleene Cooper III, MD 03/29/12 [phone]

## 2012-05-29 ENCOUNTER — Encounter (HOSPITAL_BASED_OUTPATIENT_CLINIC_OR_DEPARTMENT_OTHER): Payer: Self-pay | Admitting: Emergency Medicine

## 2012-05-29 ENCOUNTER — Emergency Department (HOSPITAL_BASED_OUTPATIENT_CLINIC_OR_DEPARTMENT_OTHER)
Admission: EM | Admit: 2012-05-29 | Discharge: 2012-05-29 | Disposition: A | Discharge status: Home / Self Care | Payer: Self-pay | Attending: Emergency Medicine | Admitting: Emergency Medicine

## 2012-05-29 ENCOUNTER — Emergency Department (HOSPITAL_BASED_OUTPATIENT_CLINIC_OR_DEPARTMENT_OTHER): Payer: Self-pay

## 2012-05-29 DIAGNOSIS — R0602 Shortness of breath: Secondary | ICD-10-CM | POA: Insufficient documentation

## 2012-05-29 DIAGNOSIS — I10 Essential (primary) hypertension: Secondary | ICD-10-CM | POA: Insufficient documentation

## 2012-05-29 DIAGNOSIS — G4730 Sleep apnea, unspecified: Secondary | ICD-10-CM

## 2012-05-29 LAB — POCT I-STAT 3, ART BLOOD GAS (G3+)
Acid-Base Excess: 4 mmol/L — ABNORMAL HIGH (ref 0.0–2.0)
Bicarbonate: 28.2 mEq/L — ABNORMAL HIGH (ref 20.0–24.0)
O2 Saturation: 98 %
TCO2: 29 mmol/L (ref 0–100)
pO2, Arterial: 95 mmHg (ref 80.0–100.0)

## 2012-05-29 NOTE — ED Notes (Signed)
States was sitting up sleeping and had a sudden onset of SOB.  Had an episode of dizziness that went away.  States lasted for approximately 10secs.   States doesn't feel as much SOB as when it started.

## 2012-05-29 NOTE — Discharge Instructions (Signed)
Sleep Apnea  Sleep apnea is when a person's sleep is disrupted many times. This makes a person tired during the day. There are two types of sleep apnea. One is when breathing stops for a short time because your airway is blocked (obstructive sleep apnea). The other type is when the brain sometimes fails to give the normal signal to breathe (central sleep apnea).    HOME CARE   Do not sleep on your back.    Take all medicine as told by your doctor.    Avoid alcohol, calming medicines (sedatives), and depressant drugs.    Use your CPAP (a mask-like machine and pump) to keep your airway open while sleeping.    If you are overweight, try to lose some weight. Talk to your doctor about a healthy weight goal.    If the problem is related to heart failure, take medicines as told by your doctor.   MAKE SURE YOU:   Understand these instructions.    Will watch your condition.    Will get help right away if you are not doing well or get worse.   Document Released: 08/30/2008 Document Revised: 11/10/2011 Document Reviewed: 08/30/2008  [employer] Patient Information 2012 [employer].

## 2012-05-29 NOTE — ED Provider Notes (Signed)
History     CSN: [id number]  Arrival date & time 05/29/12  4098   First MD Initiated Contact with Patient 05/29/12 0913      Chief Complaint  Patient presents with  . Shortness of Breath    (Consider location/radiation/quality/duration/timing/severity/associated sxs/prior treatment) HPI Comments: Patient is a 36 year old man who says he keeps having episodes of shortness of breath. Feels like something is obstructing his airway. Today he was sitting up sleeping and woke up acutely short of breath. He says he's had 2 similar episodes during the past year. He has had a sleep study last year which showed sleep apnea but has not been able to afford that she machine.  Patient is a 36 y.o. male presenting with shortness of breath. The history is provided by the patient.  Shortness of Breath  The current episode started today. The problem occurs continuously. The problem has been unchanged. The problem is severe. Nothing relieves the symptoms. Nothing aggravates the symptoms. Associated symptoms include shortness of breath. Pertinent negatives include no fever. He was not exposed to toxic fumes. He has not inhaled smoke recently. He has had no prior hospitalizations. He has had no prior ICU admissions. He has had no prior intubations. His past medical history does not include asthma. There were no sick contacts. He has received no recent medical care.    Past Medical History  Diagnosis Date  . Hypertension     History reviewed. No pertinent past surgical history.  No family history on file.  History  Substance Use Topics  . Smoking status: Never Smoker   . Smokeless tobacco: Not on file  . Alcohol Use: No      Review of Systems  Constitutional: Negative.  Negative for fever and chills.  HENT: Negative.   Eyes: Negative.   Respiratory: Positive for shortness of breath.   Cardiovascular: Negative.   Gastrointestinal: Negative.   Genitourinary: Negative.   Musculoskeletal:  Negative.   Skin: Negative.   Neurological: Negative.   Psychiatric/Behavioral: Negative.     Allergies  Review of patient's allergies indicates no known allergies.  Home Medications   Current Outpatient Rx  Name Route Sig Dispense Refill  . ALBUTEROL SULFATE HFA 108 (90 BASE) MCG/ACT IN AERS Inhalation Inhale 1-2 puffs into the lungs every 6 (six) hours as needed for wheezing. 1 Inhaler 0  . AMLODIPINE BESYLATE 10 MG PO TABS Oral Take 10 mg by mouth daily.    Marland Kitchen HYDROCHLOROTHIAZIDE 25 MG PO TABS Oral Take 25 mg by mouth daily.    Marland Kitchen LISINOPRIL 20 MG PO TABS Oral Take 20 mg by mouth daily.      BP 148/103  Pulse 86  Temp 98 F (36.7 C) (Oral)  Resp 16  Ht 6\' 1"  (1.854 m)  Wt 295 lb (133.811 kg)  BMI 38.92 kg/m2  SpO2 99%  Physical Exam  Nursing note and vitals reviewed. Constitutional: He is oriented to person, place, and time.       BP is 154/92. Morbidly obese man in no distress at rest.  HENT:  Head: Normocephalic and atraumatic.  Right Ear: External ear normal.  Left Ear: External ear normal.  Mouth/Throat: Oropharynx is clear and moist.  Eyes: Conjunctivae and EOM are normal. Pupils are equal, round, and reactive to light. No scleral icterus.  Neck: Normal range of motion. Neck supple.  Cardiovascular: Normal rate, regular rhythm and normal heart sounds.   Pulmonary/Chest: Effort normal and breath sounds normal.  Abdominal: Soft. Bowel sounds  are normal.  Musculoskeletal: Normal range of motion. He exhibits no edema and no tenderness.  Neurological: He is alert and oriented to person, place, and time.       No sensory or motor deficit.  Skin: Skin is warm and dry.  Psychiatric: He has a normal mood and affect. His behavior is normal.    ED Course  Procedures (including critical care time)  9:36 AM Patient was seen and had physical examination. Laboratory tests ordered.  9:44 AM  Date: 05/29/2012  Rate: 80  Rhythm: normal sinus rhythm  QRS Axis: normal   Intervals: normal PQRS:  Left atrial abnormality  ST/T Wave abnormalities: nonspecific T wave changes  Conduction Disutrbances:none  Narrative Interpretation: Abnormal EKG.  Old EKG Reviewed: unchanged  9:58 AM ABG's:  PH 7.436, pCO2 41.9, pO2 96, HCO3 28.2.  O2 sat 98% on room air.  Normal ABG.  10:46 AM Chest x-ray was negative.  Will have [facility] called re CPAP.  11:01 AM Pt needs documentation of sleep apnea study, referral from his PCP to get CPAP.  He has appointment at [facility] tomorrow; they have the documentation from his sleep study last year.  11:14 AM Form to order CPAP from [facility] completed by me.  1. Sleep apnea           Carleene Cooper III, MD 05/29/12 1102    Carleene Cooper III, MD 05/29/12 1115

## 2012-08-05 ENCOUNTER — Encounter (HOSPITAL_BASED_OUTPATIENT_CLINIC_OR_DEPARTMENT_OTHER): Payer: Self-pay

## 2012-08-05 ENCOUNTER — Emergency Department (HOSPITAL_BASED_OUTPATIENT_CLINIC_OR_DEPARTMENT_OTHER)
Admission: EM | Admit: 2012-08-05 | Discharge: 2012-08-05 | Disposition: A | Discharge status: Home / Self Care | Payer: Self-pay | Attending: Emergency Medicine | Admitting: Emergency Medicine

## 2012-08-05 DIAGNOSIS — Z760 Encounter for issue of repeat prescription: Secondary | ICD-10-CM | POA: Insufficient documentation

## 2012-08-05 DIAGNOSIS — I10 Essential (primary) hypertension: Secondary | ICD-10-CM | POA: Insufficient documentation

## 2012-08-05 MED ORDER — AMLODIPINE BESYLATE 5 MG PO TABS
10.0000 mg | ORAL_TABLET | Freq: Once | ORAL | Status: AC
  Administered 2012-08-05: 10 mg via ORAL
  Filled 2012-08-05: qty 2

## 2012-08-05 MED ORDER — HYDROCHLOROTHIAZIDE 25 MG PO TABS
25.0000 mg | ORAL_TABLET | Freq: Every day | ORAL | Status: DC
  Administered 2012-08-05: 25 mg via ORAL

## 2012-08-05 MED ORDER — HYDROCHLOROTHIAZIDE 25 MG PO TABS
ORAL_TABLET | ORAL | Status: AC
  Filled 2012-08-05: qty 1

## 2012-08-05 MED ORDER — LISINOPRIL 20 MG PO TABS: 20.0000 mg | ORAL_TABLET | Freq: Every day | ORAL | Status: DC

## 2012-08-05 MED ORDER — HYDROCHLOROTHIAZIDE 25 MG PO TABS: 25.0000 mg | ORAL_TABLET | Freq: Every day | ORAL | Status: DC

## 2012-08-05 NOTE — ED Provider Notes (Signed)
History     CSN: [id number]  Arrival date & time 08/05/12  4098   First MD Initiated Contact with Patient 08/05/12 [phone]      Chief Complaint  Patient presents with  . Hypertension    (Consider location/radiation/quality/duration/timing/severity/associated sxs/prior treatment) Patient is a 36 y.o. male presenting with hypertension. The history is provided by the patient. No language interpreter was used.  Hypertension This is a chronic problem. The current episode started more than 1 week ago. The problem occurs constantly. The problem has not changed since onset.Pertinent negatives include no chest pain, no abdominal pain, no headaches and no shortness of breath. Nothing aggravates the symptoms. Nothing relieves the symptoms. He has tried nothing for the symptoms. The treatment provided no relief.  Has been out of meds x 3 weeks and wants refills.  No symptoms at this time  Past Medical History  Diagnosis Date  . Hypertension     History reviewed. No pertinent past surgical history.  No family history on file.  History  Substance Use Topics  . Smoking status: Never Smoker   . Smokeless tobacco: Not on file  . Alcohol Use: No      Review of Systems  Constitutional: Negative for fever.  HENT: Negative for neck pain and neck stiffness.   Respiratory: Negative for chest tightness and shortness of breath.   Cardiovascular: Negative for chest pain, palpitations and leg swelling.  Gastrointestinal: Negative for abdominal pain.  Genitourinary: Negative for dysuria.  Neurological: Negative for dizziness, seizures, syncope, facial asymmetry, speech difficulty, weakness, numbness and headaches.  All other systems reviewed and are negative.    Allergies  Review of patient's allergies indicates no known allergies.  Home Medications   Current Outpatient Rx  Name Route Sig Dispense Refill  . ALBUTEROL SULFATE HFA 108 (90 BASE) MCG/ACT IN AERS Inhalation Inhale 1-2 puffs into  the lungs every 6 (six) hours as needed for wheezing. 1 Inhaler 0  . AMLODIPINE BESYLATE 10 MG PO TABS Oral Take 10 mg by mouth daily.    Marland Kitchen LISINOPRIL 20 MG PO TABS Oral Take 20 mg by mouth daily.      BP 183/121  Pulse 86  Temp 98.4 F (36.9 C) (Oral)  Resp 16  SpO2 97%  Physical Exam  Constitutional: He is oriented to person, place, and time. He appears well-developed and well-nourished.  HENT:  Head: Normocephalic and atraumatic.  Right Ear: External ear normal.  Left Ear: External ear normal.  Mouth/Throat: Oropharynx is clear and moist.  Eyes: Conjunctivae and EOM are normal. Pupils are equal, round, and reactive to light.  Neck: Normal range of motion. Neck supple.  Cardiovascular: Normal rate and regular rhythm.   Pulmonary/Chest: Effort normal and breath sounds normal.  Abdominal: Soft. Bowel sounds are normal. There is no tenderness. There is no rebound and no guarding.  Musculoskeletal: Normal range of motion.  Neurological: He is alert and oriented to person, place, and time. He has normal reflexes. No cranial nerve deficit.  Skin: Skin is warm and dry.  Psychiatric: He has a normal mood and affect.    ED Course  Procedures (including critical care time)  Labs Reviewed - No data to display No results found.   No diagnosis found.    MDM  Patient here for medication refill as [facility] closed has refill on one medication with refill 2 others x 1 month.  Return for headaches shortness of breath chest pain, weakness numbness or any concerns.  Patient  verbalizes understanding and agrees to follow up        Saurav Crumble Smitty Cords, MD 08/05/12 [phone]

## 2012-08-05 NOTE — ED Notes (Signed)
Patient reports that he has been out of his BP meds x 3 weeks. Tonight had increased dizziness and checked his BP at [facility] and came here for refills. Denies shortness of breath and chestpain. No neuro deficits. Ambulatory with steady gait.

## 2012-09-05 ENCOUNTER — Emergency Department (INDEPENDENT_AMBULATORY_CARE_PROVIDER_SITE_OTHER)
Admission: EM | Admit: 2012-09-05 | Discharge: 2012-09-05 | Disposition: A | Discharge status: Home / Self Care | Payer: Self-pay | Source: Home / Self Care | Attending: Emergency Medicine | Admitting: Emergency Medicine

## 2012-09-05 ENCOUNTER — Encounter (HOSPITAL_COMMUNITY): Payer: Self-pay | Admitting: *Deleted

## 2012-09-05 DIAGNOSIS — I10 Essential (primary) hypertension: Secondary | ICD-10-CM

## 2012-09-05 LAB — POCT I-STAT, CHEM 8
BUN: 12 mg/dL (ref 6–23)
Calcium, Ion: 1.18 mmol/L (ref 1.12–1.23)
Chloride: 102 mEq/L (ref 96–112)
Creatinine, Ser: 1.1 mg/dL (ref 0.50–1.35)
Glucose, Bld: 92 mg/dL (ref 70–99)

## 2012-09-05 MED ORDER — CLONIDINE HCL 0.1 MG PO TABS
0.1000 mg | ORAL_TABLET | Freq: Once | ORAL | Status: AC
  Administered 2012-09-05: 0.1 mg via ORAL

## 2012-09-05 MED ORDER — LISINOPRIL 20 MG PO TABS: 20.0000 mg | ORAL_TABLET | Freq: Every day | ORAL | Status: DC

## 2012-09-05 MED ORDER — CLONIDINE HCL 0.1 MG PO TABS
ORAL_TABLET | ORAL | Status: AC
  Filled 2012-09-05: qty 1

## 2012-09-05 MED ORDER — HYDROCHLOROTHIAZIDE 25 MG PO TABS: 25.0000 mg | ORAL_TABLET | Freq: Every day | ORAL | Status: DC

## 2012-09-05 NOTE — ED Notes (Signed)
Pt   Reports     Headache          He  Reports  Being  Dizzy  As  Well     Pt    Reports   Was  A  Patient   At  [facility]             And  Was  On  bp   Pills   Has  Not had  Any  meds  In   2  Months          He  States  He  Has  No  Doctor

## 2012-09-05 NOTE — ED Provider Notes (Signed)
History     CSN: [id number]  Arrival date & time 09/05/12  1216   First MD Initiated Contact with Patient 09/05/12 1222      Chief Complaint  Patient presents with  . Headache    (Consider location/radiation/quality/duration/timing/severity/associated sxs/prior treatment) HPI Comments: Patient presents urgent care requesting blood pressure medicine refills as he's feeling again a headache when he knows his blood pressure is high. Patient denies that he monitors blood pressure at home and has been without medicine for several weeks. Today he just became aware that his clinic has closed ([facility]), was told to come here for blood pressure medicine refills. Patient denies specific symptoms when asked such as numbness, weakness of any upper or lower extremity, no chest pains or palpitations or shortness of breath. Describes his headache as a dull type pain in his for his diarrheas. Denies any fevers, congestion or cough.  Patient is a 36 y.o. male presenting with headaches. The history is provided by the patient.  Headache The primary symptoms include headaches and focal weakness. Primary symptoms do not include syncope, loss of consciousness, altered mental status, seizures, dizziness, visual change, nausea or vomiting. The symptoms began 2 days ago. The symptoms are unchanged.  The headache is not associated with photophobia, visual change, neck stiffness, weakness or loss of balance.  Additional symptoms do not include neck stiffness, weakness, pain, loss of balance, photophobia, nystagmus, taste disturbance or vertigo. Medical issues also include hypertension. Medical issues do not include cerebral vascular accident or alcohol use.    Past Medical History  Diagnosis Date  . Hypertension     No past surgical history on file.  No family history on file.  History  Substance Use Topics  . Smoking status: Never Smoker   . Smokeless tobacco: Not on file  . Alcohol Use: No       Review of Systems  Constitutional: Negative for chills, activity change and appetite change.  HENT: Negative for neck stiffness.   Eyes: Negative for photophobia.  Respiratory: Negative for cough, chest tightness and shortness of breath.   Cardiovascular: Negative for chest pain, palpitations, leg swelling and syncope.  Gastrointestinal: Negative for nausea and vomiting.  Skin: Negative for color change, rash and wound.  Neurological: Positive for focal weakness and headaches. Negative for dizziness, vertigo, seizures, loss of consciousness, syncope, facial asymmetry, weakness and loss of balance.  Psychiatric/Behavioral: Negative for altered mental status.    Allergies  Review of patient's allergies indicates no known allergies.  Home Medications   Current Outpatient Rx  Name Route Sig Dispense Refill  . ALBUTEROL SULFATE HFA 108 (90 BASE) MCG/ACT IN AERS Inhalation Inhale 1-2 puffs into the lungs every 6 (six) hours as needed for wheezing. 1 Inhaler 0  . AMLODIPINE BESYLATE 10 MG PO TABS Oral Take 10 mg by mouth daily.    Marland Kitchen HYDROCHLOROTHIAZIDE 25 MG PO TABS Oral Take 1 tablet (25 mg total) by mouth daily. 30 tablet 3  . LISINOPRIL 20 MG PO TABS Oral Take 1 tablet (20 mg total) by mouth daily. 30 tablet 3  . LISINOPRIL 20 MG PO TABS Oral Take 1 tablet (20 mg total) by mouth daily. 30 tablet 3    BP 158/103  Pulse 78  Temp 98.6 F (37 C)  Resp 18  SpO2 100%  Physical Exam  Nursing note and vitals reviewed. Constitutional: He appears well-developed and well-nourished. No distress.  Eyes: Pupils are equal, round, and reactive to light.  Neck: No JVD present.  Cardiovascular: Normal rate.  Exam reveals no gallop and no friction rub.   No murmur heard. Pulmonary/Chest: Effort normal and breath sounds normal.  Neurological: He is alert. No cranial nerve deficit or sensory deficit. He exhibits normal muscle tone. Coordination and gait normal. GCS eye subscore is 4. GCS  verbal subscore is 5. GCS motor subscore is 6.  Skin: No rash noted. No erythema.    ED Course  Procedures (including critical care time)   Labs Reviewed  POCT I-STAT, CHEM 8   No results found.   1. HYPERTENSION       MDM   Patient presents urgent care requesting blood pressure medicine refills. It is noted with chart review the patient routinely presents urgent care with a similar scenario. Patient is noted today hypertensive describing a mild to moderate headache without any other neurological symptoms. His cardiovascular, respiratory neurological exam were unremarkable. Electrolytes and kidney function within normal. We have started him on blood pressure medicines which include an ACE inhibitor and a diuretic. With clear instructions on how to followup with her primary care Dr. for future medicine refills and blood pressure control. I have advised patient about risk associated with uncontrollable oral and monitor her high blood pressure. As it puts him at risk of having a coronary event such as a heart attack or a stroke. Patient acknowledges and understands the risk associated with uncontrolled blood pressure.      Jimmie Molly, MD 09/05/12 [phone]

## 2012-09-29 ENCOUNTER — Encounter (HOSPITAL_BASED_OUTPATIENT_CLINIC_OR_DEPARTMENT_OTHER): Payer: Self-pay | Admitting: *Deleted

## 2012-09-29 ENCOUNTER — Emergency Department (HOSPITAL_BASED_OUTPATIENT_CLINIC_OR_DEPARTMENT_OTHER)
Admission: EM | Admit: 2012-09-29 | Discharge: 2012-09-29 | Disposition: A | Discharge status: Home / Self Care | Payer: Self-pay | Attending: Emergency Medicine | Admitting: Emergency Medicine

## 2012-09-29 ENCOUNTER — Emergency Department (HOSPITAL_BASED_OUTPATIENT_CLINIC_OR_DEPARTMENT_OTHER): Payer: Self-pay

## 2012-09-29 DIAGNOSIS — Z792 Long term (current) use of antibiotics: Secondary | ICD-10-CM | POA: Insufficient documentation

## 2012-09-29 DIAGNOSIS — Z79899 Other long term (current) drug therapy: Secondary | ICD-10-CM | POA: Insufficient documentation

## 2012-09-29 DIAGNOSIS — I10 Essential (primary) hypertension: Secondary | ICD-10-CM | POA: Insufficient documentation

## 2012-09-29 DIAGNOSIS — R0602 Shortness of breath: Secondary | ICD-10-CM | POA: Insufficient documentation

## 2012-09-29 LAB — BASIC METABOLIC PANEL
BUN: 13 mg/dL (ref 6–23)
CO2: 26 mEq/L (ref 19–32)
Calcium: 9 mg/dL (ref 8.4–10.5)
Creatinine, Ser: 1.1 mg/dL (ref 0.50–1.35)
GFR calc Af Amer: >90 mL/min (ref >90)

## 2012-09-29 LAB — CBC WITH DIFFERENTIAL/PLATELET
Basophils Absolute: 0 10*3/uL (ref 0.0–0.1)
Basophils Relative: 0 % (ref 0–1)
Eosinophils Relative: 4 % (ref 0–5)
HCT: 38.8 % — ABNORMAL LOW (ref 39.0–52.0)
MCHC: 34 g/dL (ref 30.0–36.0)
MCV: 79.5 fL (ref 78.0–100.0)
Monocytes Absolute: 0.6 10*3/uL (ref 0.1–1.0)
Monocytes Relative: 9 % (ref 3–12)
RDW: 14.6 % (ref 11.5–15.5)

## 2012-09-29 LAB — TROPONIN I: Troponin I: <0.3 ng/mL (ref <0.30)

## 2012-09-29 NOTE — ED Provider Notes (Signed)
History     CSN: [id number]  Arrival date & time 09/29/12  4098   First MD Initiated Contact with Patient 09/29/12 0809      Chief Complaint  Patient presents with  . Shortness of Breath    (Consider location/radiation/quality/duration/timing/severity/associated sxs/prior treatment) HPI Comments: Patient presents today with shortness of breath. He states that this started around 7:00 this morning when he got off work. He states he had a feeling that he was smothering. It was not really changed with position not although at times it got worse when he was laying flat. It was nonexertional. He denied any chest pain although he felt at one point did he was having some gas and took some Rolaids for this. He also describes the left side of his chest as feeling itchy. He denies any pain or tightness in his chest. Denies any swelling in his legs. Denies any diaphoresis. He states he had a similar episode one time in the past in April it resolved. Denies a history of known heart problems. His blood pressures elevated on arrival however he did not take his blood pressure medicine this morning. Denies a known history of hyperlipidemia. Denies any smoking history. Denies any known family history of early coronary artery disease although his father died at age of 32. This was not cardiac in nature. His mother still living in his siblings are all younger than he is. Denies any known risk factors for VTE.  Patient is a 36 y.o. male presenting with shortness of breath.  Shortness of Breath  Associated symptoms include shortness of breath. Pertinent negatives include no chest pain, no fever, no rhinorrhea and no cough.    Past Medical History  Diagnosis Date  . Hypertension     History reviewed. No pertinent past surgical history.  No family history on file.  History  Substance Use Topics  . Smoking status: Never Smoker   . Smokeless tobacco: Not on file  . Alcohol Use: No      Review of  Systems  Constitutional: Negative for fever, chills, diaphoresis and fatigue.  HENT: Negative for congestion, rhinorrhea and sneezing.   Eyes: Negative.   Respiratory: Positive for shortness of breath. Negative for cough and chest tightness.   Cardiovascular: Negative for chest pain and leg swelling.  Gastrointestinal: Negative for nausea, vomiting, abdominal pain, diarrhea and blood in stool.  Genitourinary: Negative for frequency, hematuria, flank pain and difficulty urinating.  Musculoskeletal: Negative for back pain and arthralgias.  Skin: Negative for rash.  Neurological: Negative for dizziness, speech difficulty, weakness, numbness and headaches.    Allergies  Review of patient's allergies indicates no known allergies.  Home Medications   Current Outpatient Rx  Name Route Sig Dispense Refill  . ALBUTEROL SULFATE HFA 108 (90 BASE) MCG/ACT IN AERS Inhalation Inhale 1-2 puffs into the lungs every 6 (six) hours as needed for wheezing. 1 Inhaler 0  . AMLODIPINE BESYLATE 10 MG PO TABS Oral Take 10 mg by mouth daily.    Marland Kitchen HYDROCHLOROTHIAZIDE 25 MG PO TABS Oral Take 1 tablet (25 mg total) by mouth daily. 30 tablet 3  . LISINOPRIL 20 MG PO TABS Oral Take 1 tablet (20 mg total) by mouth daily. 30 tablet 3  . LISINOPRIL 20 MG PO TABS Oral Take 1 tablet (20 mg total) by mouth daily. 30 tablet 3    BP 148/94  Pulse 60  Temp 98.5 F (36.9 C) (Oral)  Resp 16  SpO2 100%  Physical Exam  Constitutional: He is oriented to person, place, and time. He appears well-developed and well-nourished.  HENT:  Head: Normocephalic and atraumatic.  Eyes: Pupils are equal, round, and reactive to light.  Neck: Normal range of motion. Neck supple.  Cardiovascular: Normal rate, regular rhythm and normal heart sounds.   Pulmonary/Chest: Effort normal and breath sounds normal. No respiratory distress. He has no wheezes. He has no rales. He exhibits no tenderness.  Abdominal: Soft. Bowel sounds are normal.  There is no tenderness. There is no rebound and no guarding.  Musculoskeletal: Normal range of motion. He exhibits no edema and no tenderness.       Negative Homans sign  Lymphadenopathy:    He has no cervical adenopathy.  Neurological: He is alert and oriented to person, place, and time.  Skin: Skin is warm and dry. No rash noted.  Psychiatric: He has a normal mood and affect.    ED Course  Procedures (including critical care time)  Results for orders placed during the hospital encounter of 09/29/12  CBC WITH DIFFERENTIAL      Component Value Range   WBC 7.3  4.0 - 10.5 K/uL   RBC 4.88  4.22 - 5.81 MIL/uL   Hemoglobin 13.2  13.0 - 17.0 g/dL   HCT 11.9 (*) 14.7 - 82.9 %   MCV 79.5  78.0 - 100.0 fL   MCH 27.0  26.0 - 34.0 pg   MCHC 34.0  30.0 - 36.0 g/dL   RDW 56.2  13.0 - 86.5 %   Platelets 302  150 - 400 K/uL   Neutrophils Relative 48  43 - 77 %   Neutro Abs 3.5  1.7 - 7.7 K/uL   Lymphocytes Relative 39  12 - 46 %   Lymphs Abs 2.9  0.7 - 4.0 K/uL   Monocytes Relative 9  3 - 12 %   Monocytes Absolute 0.6  0.1 - 1.0 K/uL   Eosinophils Relative 4  0 - 5 %   Eosinophils Absolute 0.3  0.0 - 0.7 K/uL   Basophils Relative 0  0 - 1 %   Basophils Absolute 0.0  0.0 - 0.1 K/uL  BASIC METABOLIC PANEL      Component Value Range   Sodium 139  135 - 145 mEq/L   Potassium 3.2 (*) 3.5 - 5.1 mEq/L   Chloride 100  96 - 112 mEq/L   CO2 26  19 - 32 mEq/L   Glucose, Bld 115 (*) 70 - 99 mg/dL   BUN 13  6 - 23 mg/dL   Creatinine, Ser 7.84  0.50 - 1.35 mg/dL   Calcium 9.0  8.4 - 69.6 mg/dL   GFR calc non Af Amer 85 (*) >90 mL/min   GFR calc Af Amer >90  >90 mL/min  TROPONIN I      Component Value Range   Troponin I <0.30  <0.30 ng/mL  D-DIMER, QUANTITATIVE      Component Value Range   D-Dimer, Quant <0.27  0.00 - 0.48 ug/mL-FEU  TROPONIN I      Component Value Range   Troponin I <0.30  <0.30 ng/mL   Dg Chest 2 View  09/29/2012  *RADIOLOGY REPORT*  Clinical Data: Shortness of breath  and chest pain.  CHEST - 2 VIEW  Comparison: Two-view chest 05/29/2012.  Findings: The heart size is normal.  The lungs are clear.  The visualized soft tissues and bony thorax are unremarkable.  IMPRESSION: Negative chest.   Original Report Authenticated By: Cristal Deer  W. MATTERN, M.D.     Date: 09/29/2012  Rate: 68  Rhythm: normal sinus rhythm  QRS Axis: normal  Intervals: normal  ST/T Wave abnormalities: nonspecific ST/T changes  Conduction Disutrbances:none  Narrative Interpretation:   Old EKG Reviewed: unchanged  .   1. Shortness of breath       MDM  Pt has been asymptomatic since arrival.  No suggestion of ACS.  No EKG changes.  Has had 2 neg troponins.  Nothing else suggestive of PE.  Encouraged pt to f/u as an outpt or to return for worsening symptoms.  Was given resources for possible f/u.        Rolan Bucco, MD 09/29/12 1357

## 2012-09-29 NOTE — ED Notes (Signed)
Patient reports ha at 5:30 and took ibuprofen, felt better but when he got off work at 7am started feeling sob, dizzy. Had eaten a pop tart earlier and didn't know if it was acid reflux.. Feels as if he is better smothered at times

## 2012-10-15 ENCOUNTER — Encounter (HOSPITAL_BASED_OUTPATIENT_CLINIC_OR_DEPARTMENT_OTHER): Payer: Self-pay | Admitting: Family Medicine

## 2012-10-15 ENCOUNTER — Emergency Department (HOSPITAL_BASED_OUTPATIENT_CLINIC_OR_DEPARTMENT_OTHER)
Admission: EM | Admit: 2012-10-15 | Discharge: 2012-10-15 | Disposition: A | Discharge status: Home / Self Care | Payer: Self-pay | Attending: Emergency Medicine | Admitting: Emergency Medicine

## 2012-10-15 DIAGNOSIS — I10 Essential (primary) hypertension: Secondary | ICD-10-CM | POA: Insufficient documentation

## 2012-10-15 LAB — BASIC METABOLIC PANEL
BUN: 16 mg/dL (ref 6–23)
CO2: 28 mEq/L (ref 19–32)
Calcium: 9.3 mg/dL (ref 8.4–10.5)
Creatinine, Ser: 1.2 mg/dL (ref 0.50–1.35)
Glucose, Bld: 110 mg/dL — ABNORMAL HIGH (ref 70–99)

## 2012-10-15 LAB — CBC
HCT: 38.5 % — ABNORMAL LOW (ref 39.0–52.0)
MCH: 27 pg (ref 26.0–34.0)
MCV: 79.9 fL (ref 78.0–100.0)
Platelets: 289 10*3/uL (ref 150–400)
RBC: 4.82 MIL/uL (ref 4.22–5.81)

## 2012-10-15 MED ORDER — HYDROCHLOROTHIAZIDE 12.5 MG PO TABS: 12.5000 mg | ORAL_TABLET | Freq: Every day | ORAL | Status: DC

## 2012-10-15 MED ORDER — AMLODIPINE BESYLATE 10 MG PO TABS: 10.0000 mg | ORAL_TABLET | Freq: Every day | ORAL | Status: DC

## 2012-10-15 MED ORDER — HYDRALAZINE HCL 20 MG/ML IJ SOLN
10.0000 mg | INTRAMUSCULAR | Status: AC
  Administered 2012-10-15: 10 mg via INTRAVENOUS
  Filled 2012-10-15: qty 1

## 2012-10-15 MED ORDER — AMLODIPINE BESYLATE 5 MG PO TABS
10.0000 mg | ORAL_TABLET | Freq: Once | ORAL | Status: AC
  Administered 2012-10-15: 10 mg via ORAL
  Filled 2012-10-15: qty 2

## 2012-10-15 MED ORDER — LISINOPRIL 10 MG PO TABS: 20.0000 mg | ORAL_TABLET | Freq: Every day | ORAL | Status: DC

## 2012-10-15 NOTE — Discharge Instructions (Signed)
Take blood pressure medications as written. This needs to be followed by a PCP see resource guide below. Consider checking your blood pressure at home with an at home cuff. If you develop CP, SOB, or the worse headache of your life, return to the ER. Hypertension Hypertension is another name for high blood pressure. High blood pressure may mean that your heart needs to work harder to pump blood. Blood pressure consists of two numbers, which includes a higher number over a lower number (example: 110/72). HOME CARE   Make lifestyle changes as told by your doctor. This may include weight loss and exercise.  Take your blood pressure medicine every day.  Limit how much salt you use.  Stop smoking if you smoke.  Do not use drugs.  Talk to your doctor if you are using decongestants or birth control pills. These medicines might make blood pressure higher.  Females should not drink more than 1 alcoholic drink per day. Males should not drink more than 2 alcoholic drinks per day.  See your doctor as told. GET HELP RIGHT AWAY IF:   You have a blood pressure reading with a top number of 180 or higher.  You get a very bad headache.  You get blurred or changing vision.  You feel confused.  You feel weak, numb, or faint.  You get chest or belly (abdominal) pain.  You throw up (vomit).  You cannot breathe very well. MAKE SURE YOU:   Understand these instructions.  Will watch your condition.  Will get help right away if you are not doing well or get worse. Document Released: 05/09/2008 Document Revised: 02/13/2012 Document Reviewed: 05/09/2008 [facility] Patient Information 2013 [place], [place]. RESOURCE GUIDE  Chronic Pain Problems: Contact [facility]  [phone] Patients need to be referred by their primary care doctor.  Insufficient Money for Medicine: Contact United Way:  call "211" or [facility] 7470512156.  No Primary Care Doctor: - Call Health  Connect  [phone] - can help you locate a primary care doctor that  accepts your insurance, provides certain services, etc. - Physician Referral [phone]  Agencies that provide inexpensive medical care: - [facility]  [phone] - [facility]  [phone] - [facility]  [phone] [facility]  [phone] - Planned Parenthood  [phone] [facility]  [phone]  Medicaid-accepting [facility] Providers: - [facility]- [address]  [phone], Mon-Fri 9am-7pm, Sat 9am-1pm - [facility]- [address] [place], [address] [place]  [phone] - [facility]- [address] [place]  [phone] [facility]- [address]  [phone] - Renaye Rakers- [address] [place], [address], [phone]  Only accepts [place] patients after they have their name  applied to their card  Self Pay (no insurance) in [place]: - Sickle Cell Patients: Dr Willey Blade, [facility]  [address] [place], [phone] - [facility]- [address] [place]  [phone]       [facility]- [address]       -     [facility]- see information above (Speak to [employer] if you do not have insurance)       -  [facility]- [address] [place], [phone]       -  [facility]- [address],  [phone]       -  [facility]- [address] [phone]       -  Dr Julio Sicks-  [address], [address], [place], [phone]       -  [facility]- [address] [phone]       -  [facility]- [address], [phone], also [address] [phone]       -    [facility]- [address] [place], [phone], 1st & 3rd Saturday   every month, 10am-1pm  1) Find a Doctor and Pay Out of Pocket Although you won't  have to find out who is covered by your insurance plan, it is a good idea to ask around and get recommendations. You will then need to call the office and see if the doctor you have chosen will accept you as a new patient and what types of options they offer for patients who are self-pay. Some doctors offer discounts or will set up payment plans for their patients who do not have insurance, but you will need to ask so you aren't surprised when you get to your appointment.  2) Contact Your Local Health Department Not all health departments have doctors that can see patients for sick visits, but many do, so it is worth a call to see if yours does. If you don't know where your local health department is, you can check in your phone book. The CDC also has a tool to help you locate your state's health department, and many state websites also have listings of all of their local health departments.  3) Find a Walk-in Clinic If your illness is not likely to be very severe or complicated, you may want to try a walk in clinic. These are popping up all over the country in pharmacies, drugstores, and shopping centers. They're usually staffed by nurse practitioners or physician assistants that have been trained to treat common illnesses and complaints. They're usually fairly quick and inexpensive. However, if you have serious medical issues or chronic medical problems, these are probably not your best option  STD Testing - [facility] of [facility] [place], STD Clinic, [address] [place], phone [phone] or [phone].  Monday - Friday, call for an appointment. [facility] Department of [employer], STD Clinic, [place] [address], [place], phone [phone] or [phone].  Monday - Friday, call for an appointment.  Abuse/Neglect: [facility] Child Abuse Hotline 5167751445 [facility] Child Abuse Hotline 2507218797 (After Hours)  Emergency  Shelter:  [facility] [phone]  Maternity Homes: - [facility] [place] [facility] [phone] - [facility] [phone]  MRSA Hotline #:   361-637-4770  [facility] Resources  [facility] [place]  United [employer] [facility] Dept. [address]                 [address]         [address] [place] [address]  [address]  [facility] Phone:  [phone]                                  Phone:  [phone]                   Phone:  [phone]  [facility], [phone] - [facility] - [phone]       -     [facility] in [place], [address],                                  [phone], [facility] Child Abuse Hotline 229 383 6353 or 314-744-3363 (After Hours)   Behavioral Health Services  Substance Abuse Resources: - [facility] and Drug Services  ([phone] - [facility] [phone] - [facility] 9856464297 Floydene Flock [phone] - Residential & Outpatient Substance Abuse Program  [phone]  Psychological Services: [facility]  [phone] [facility] Services  [phone] - [facility], [phone] [place]. [address] [place], ACCESS LINE: [phone] or [phone], [employer]  Dental Assistance  If unable to pay or uninsured, contact:  [facility] or [facility]. to become qualified for the adult dental clinic.  Patients with Medicaid: [facility] [phone] [address], [phone] [address] [phone]  If unable to pay, or uninsured, contact [facility] [phone]) or [facility] Department [phone] in [place], [phone] in [facility]) to become qualified for the adult  dental clinic  Other Low-Cost Community Dental Services: - [facility]- [address] [place], [place], [place], [address], [phone], [address], 2nd and 4th Thursday of the month at 6:30am.  10 clients each day by appointment, can sometimes see walk-in patients if someone does not show for an appointment. [facility]- [address] [place], [place], [address], [phone] - [facility]- [address] [place], [place], [address], [phone] - [facility]- [phone] [facility] Department- [phone] [facility] Department- [phone]

## 2012-10-15 NOTE — ED Notes (Signed)
Pt c/o having high BP 185/120 today and is currently taking BP meds  X 2 months but unable to see PCP until January.

## 2012-10-15 NOTE — ED Notes (Signed)
No chest pain per Pt. And no shortness of breath per pt.

## 2012-10-15 NOTE — ED Notes (Signed)
Pt. Reports it has been 5 mths since he took amlodipine.  Pt. Reports he was a [facility].

## 2012-10-16 ENCOUNTER — Emergency Department (HOSPITAL_BASED_OUTPATIENT_CLINIC_OR_DEPARTMENT_OTHER)
Admission: EM | Admit: 2012-10-16 | Discharge: 2012-10-16 | Disposition: A | Discharge status: Home / Self Care | Payer: Self-pay | Attending: Emergency Medicine | Admitting: Emergency Medicine

## 2012-10-16 ENCOUNTER — Encounter (HOSPITAL_BASED_OUTPATIENT_CLINIC_OR_DEPARTMENT_OTHER): Payer: Self-pay | Admitting: Family Medicine

## 2012-10-16 ENCOUNTER — Emergency Department (HOSPITAL_BASED_OUTPATIENT_CLINIC_OR_DEPARTMENT_OTHER): Payer: Self-pay

## 2012-10-16 DIAGNOSIS — H53149 Visual discomfort, unspecified: Secondary | ICD-10-CM | POA: Insufficient documentation

## 2012-10-16 DIAGNOSIS — I10 Essential (primary) hypertension: Secondary | ICD-10-CM | POA: Insufficient documentation

## 2012-10-16 DIAGNOSIS — Z79899 Other long term (current) drug therapy: Secondary | ICD-10-CM | POA: Insufficient documentation

## 2012-10-16 DIAGNOSIS — R51 Headache: Secondary | ICD-10-CM | POA: Insufficient documentation

## 2012-10-16 MED ORDER — HYDROCHLOROTHIAZIDE 25 MG PO TABS: 25.0000 mg | ORAL_TABLET | Freq: Every day | ORAL | Status: DC

## 2012-10-16 MED ORDER — HYDROCHLOROTHIAZIDE 25 MG PO TABS
25.0000 mg | ORAL_TABLET | Freq: Every day | ORAL | Status: DC
  Administered 2012-10-16: 25 mg via ORAL

## 2012-10-16 MED ORDER — AMLODIPINE BESYLATE 5 MG PO TABS
ORAL_TABLET | ORAL | Status: AC
  Filled 2012-10-16: qty 2

## 2012-10-16 MED ORDER — LISINOPRIL 10 MG PO TABS
20.0000 mg | ORAL_TABLET | Freq: Every day | ORAL | Status: DC
  Administered 2012-10-16: 20 mg via ORAL

## 2012-10-16 MED ORDER — LISINOPRIL 10 MG PO TABS
ORAL_TABLET | ORAL | Status: AC
  Filled 2012-10-16: qty 2

## 2012-10-16 MED ORDER — AMLODIPINE BESYLATE 5 MG PO TABS
10.0000 mg | ORAL_TABLET | Freq: Every day | ORAL | Status: DC
  Administered 2012-10-16: 10 mg via ORAL

## 2012-10-16 MED ORDER — HYDROCHLOROTHIAZIDE 25 MG PO TABS
ORAL_TABLET | ORAL | Status: AC
  Filled 2012-10-16: qty 1

## 2012-10-16 MED ORDER — AMLODIPINE BESYLATE 10 MG PO TABS: 10.0000 mg | ORAL_TABLET | Freq: Every day | ORAL | Status: DC

## 2012-10-16 MED ORDER — ACETAMINOPHEN 325 MG PO TABS
650.0000 mg | ORAL_TABLET | Freq: Four times a day (QID) | ORAL | Status: DC | PRN
  Administered 2012-10-16: 650 mg via ORAL
  Filled 2012-10-16: qty 2

## 2012-10-16 MED ORDER — LISINOPRIL 20 MG PO TABS: 20.0000 mg | ORAL_TABLET | Freq: Every day | ORAL | Status: DC

## 2012-10-16 NOTE — ED Notes (Signed)
Pt c/o htn and headache

## 2012-10-16 NOTE — ED Provider Notes (Signed)
History     CSN: [id number]  Arrival date & time 10/15/12  1725   First MD Initiated Contact with Patient 10/15/12 1846      Chief Complaint  Patient presents with  . Hypertension    (Consider location/radiation/quality/duration/timing/severity/associated sxs/prior treatment) HPI Comments: Patient presents with complaint of elevated blood pressure. Patient states that he [facility] and takes lisinopril and HCTZ. Patient states that he was also on Norvasc but has been out of medication for 5 months. He states that today he had a headache, similar to other headaches when his blood pressure is high. This headache prompted him to check his BP at [facility]. He states that the "top number was 180". Denies fever or chills. Denies NVD or abdominal pain. Denies CP, SOB, or lower extremity edema.   The history is provided by the patient. No language interpreter was used.    Past Medical History  Diagnosis Date  . Hypertension     History reviewed. No pertinent past surgical history.  No family history on file.  History  Substance Use Topics  . Smoking status: Never Smoker   . Smokeless tobacco: Not on file  . Alcohol Use: No      Review of Systems  Constitutional: Negative for fever and chills.  Respiratory: Negative for shortness of breath.   Cardiovascular: Negative for chest pain, palpitations and leg swelling.  Gastrointestinal: Negative for nausea, vomiting, abdominal pain and diarrhea.    Allergies  Review of patient's allergies indicates no known allergies.  Home Medications   Current Outpatient Rx  Name  Route  Sig  Dispense  Refill  . ALBUTEROL SULFATE HFA 108 (90 BASE) MCG/ACT IN AERS   Inhalation   Inhale 1-2 puffs into the lungs every 6 (six) hours as needed for wheezing.   1 Inhaler   0   . AMLODIPINE BESYLATE 10 MG PO TABS   Oral   Take 1 tablet (10 mg total) by mouth daily.   30 tablet   3   . AMLODIPINE BESYLATE 10 MG PO  TABS   Oral   Take 1 tablet (10 mg total) by mouth daily.   30 tablet   0   . HYDROCHLOROTHIAZIDE 25 MG PO TABS   Oral   Take 1 tablet (25 mg total) by mouth daily.   30 tablet   3   . HYDROCHLOROTHIAZIDE 25 MG PO TABS   Oral   Take 1 tablet (25 mg total) by mouth daily.   30 tablet   0   . LISINOPRIL 20 MG PO TABS   Oral   Take 1 tablet (20 mg total) by mouth daily.   30 tablet   3   . LISINOPRIL 20 MG PO TABS   Oral   Take 1 tablet (20 mg total) by mouth daily.   30 tablet   0     BP 164/102  Pulse 74  Temp 98.1 F (36.7 C) (Oral)  Resp 18  SpO2 98%  Physical Exam  Nursing note and vitals reviewed. Constitutional: He appears well-developed and well-nourished.  HENT:  Head: Normocephalic and atraumatic.  Mouth/Throat: Oropharynx is clear and moist.  Eyes: Conjunctivae normal and EOM are normal. No scleral icterus.  Neck: Normal range of motion. Neck supple.  Cardiovascular: Normal rate, regular rhythm, normal heart sounds and intact distal pulses.   Pulmonary/Chest: Effort normal and breath sounds normal. He has no wheezes.  Abdominal: Soft. Bowel sounds are normal. There  is no tenderness.  Neurological: He is alert.  Skin: Skin is warm and dry.    ED Course  Procedures (including critical care time)  Labs Reviewed  CBC - Abnormal; Notable for the following:    HCT 38.5 (*)     All other components within normal limits  BASIC METABOLIC PANEL - Abnormal; Notable for the following:    Glucose, Bld 110 (*)     GFR calc non Af Amer 76 (*)     GFR calc Af Amer 89 (*)     All other components within normal limits   Ct Head Wo Contrast  10/16/2012  *RADIOLOGY REPORT*  Clinical Data: Headache, dizziness  CT HEAD WITHOUT CONTRAST  Technique:  Contiguous axial images were obtained from the base of the skull through the vertex without contrast.  Comparison: None.  Findings: No skull fracture is noted.  Paranasal sinuses and mastoid air cells are  unremarkable.  No intracranial hemorrhage, mass effect or midline shift.  No acute infarction.  No mass lesion is noted on this unenhanced scan.  The gray and white matter differentiation is preserved.  No hydrocephalus.  IMPRESSION: No acute intracranial abnormality.   Original Report Authenticated By: Natasha Mead, M.D.      1. Hypertension       MDM  Patient presented with complaint of hypertension. Patient home dose of Norvasc and hydralazine with improvement. Patient discharged with refills for all HTN meds. Patient given resource guide to establish primary care. Labs all unremarkable. Discharged with return precautions. No red flags for end organ damage or hypertensive urgency.        Pixie Casino, PA-C 10/16/12 1750

## 2012-10-16 NOTE — ED Provider Notes (Signed)
Medical screening examination/treatment/procedure(s) were performed by non-physician practitioner and as supervising physician I was immediately available for consultation/collaboration.   Raquelle Pietro, MD 10/16/12 1909 

## 2012-10-16 NOTE — ED Provider Notes (Signed)
History     CSN: [id number]  Arrival date & time 10/16/12  0709   First MD Initiated Contact with Patient 10/16/12 0719      Chief Complaint  Patient presents with  . Hypertension     HPI Patient seen last night for elevated blood pressure.  Continues to have elevated pressure associated with headache.  Has been off his medication for the last months.  Needs refill.  Patient has some photophobia but no nausea vomiting.  Patient denies a fever.  Denies stiff neck.  Headache has been gradual in onset.  Patient denies chest pain. Past Medical History  Diagnosis Date  . Hypertension     History reviewed. No pertinent past surgical history.  No family history on file.  History  Substance Use Topics  . Smoking status: Never Smoker   . Smokeless tobacco: Not on file  . Alcohol Use: No      Review of Systems All other systems reviewed and are negative Allergies  Review of patient's allergies indicates no known allergies.  Home Medications   Current Outpatient Rx  Name  Route  Sig  Dispense  Refill  . ALBUTEROL SULFATE HFA 108 (90 BASE) MCG/ACT IN AERS   Inhalation   Inhale 1-2 puffs into the lungs every 6 (six) hours as needed for wheezing.   1 Inhaler   0   . AMLODIPINE BESYLATE 10 MG PO TABS   Oral   Take 1 tablet (10 mg total) by mouth daily.   30 tablet   3   . AMLODIPINE BESYLATE 10 MG PO TABS   Oral   Take 1 tablet (10 mg total) by mouth daily.   30 tablet   0   . HYDROCHLOROTHIAZIDE 25 MG PO TABS   Oral   Take 1 tablet (25 mg total) by mouth daily.   30 tablet   3   . HYDROCHLOROTHIAZIDE 25 MG PO TABS   Oral   Take 1 tablet (25 mg total) by mouth daily.   30 tablet   0   . LISINOPRIL 20 MG PO TABS   Oral   Take 1 tablet (20 mg total) by mouth daily.   30 tablet   3   . LISINOPRIL 20 MG PO TABS   Oral   Take 1 tablet (20 mg total) by mouth daily.   30 tablet   0     BP 152/89  Pulse 82  Temp 98 F (36.7 C) (Oral)  Resp 15   SpO2 98%  Physical Exam  Nursing note and vitals reviewed. Constitutional: He is oriented to person, place, and time. He appears well-developed and well-nourished. No distress.  HENT:  Head: Normocephalic and atraumatic.  Eyes: Pupils are equal, round, and reactive to light.  Neck: Normal range of motion.  Cardiovascular: Normal rate and intact distal pulses.   Pulmonary/Chest: No respiratory distress. He has no wheezes. He has no rales.  Abdominal: Normal appearance. He exhibits no distension.  Musculoskeletal: Normal range of motion.  Neurological: He is alert and oriented to person, place, and time. A sensory deficit is present. No cranial nerve deficit. GCS eye subscore is 4. GCS verbal subscore is 5. GCS motor subscore is 6.  Skin: Skin is warm and dry. No rash noted.  Psychiatric: He has a normal mood and affect. His behavior is normal.    ED Course  Procedures (including critical care time)  Medications  amLODipine (NORVASC) tablet 10 mg (10 mg Oral Given  10/16/12 0806)  hydrochlorothiazide (HYDRODIURIL) tablet 25 mg (25 mg Oral Given 10/16/12 0807)  lisinopril (PRINIVIL,ZESTRIL) tablet 20 mg (20 mg Oral Given 10/16/12 0806)  acetaminophen (TYLENOL) tablet 650 mg (650 mg Oral Given 10/16/12 0805)  amLODipine (NORVASC) 10 MG tablet (not administered)  hydrochlorothiazide (HYDRODIURIL) 25 MG tablet (not administered)  lisinopril (PRINIVIL,ZESTRIL) 20 MG tablet (not administered)    Labs Reviewed - No data to display Ct Head Wo Contrast  10/16/2012  *RADIOLOGY REPORT*  Clinical Data: Headache, dizziness  CT HEAD WITHOUT CONTRAST  Technique:  Contiguous axial images were obtained from the base of the skull through the vertex without contrast.  Comparison: None.  Findings: No skull fracture is noted.  Paranasal sinuses and mastoid air cells are unremarkable.  No intracranial hemorrhage, mass effect or midline shift.  No acute infarction.  No mass lesion is noted on this unenhanced  scan.  The gray and white matter differentiation is preserved.  No hydrocephalus.  IMPRESSION: No acute intracranial abnormality.   Original Report Authenticated By: Natasha Mead, M.D.      1. HYPERTENSION   2. Headache       MDM         Nelia Shi, MD 10/16/12 [phone]

## 2012-10-26 ENCOUNTER — Encounter (HOSPITAL_BASED_OUTPATIENT_CLINIC_OR_DEPARTMENT_OTHER): Payer: Self-pay | Admitting: Family Medicine

## 2012-10-26 ENCOUNTER — Emergency Department (HOSPITAL_BASED_OUTPATIENT_CLINIC_OR_DEPARTMENT_OTHER)
Admission: EM | Admit: 2012-10-26 | Discharge: 2012-10-26 | Disposition: A | Discharge status: Home / Self Care | Payer: Self-pay | Attending: Emergency Medicine | Admitting: Emergency Medicine

## 2012-10-26 DIAGNOSIS — Z79899 Other long term (current) drug therapy: Secondary | ICD-10-CM | POA: Insufficient documentation

## 2012-10-26 DIAGNOSIS — R220 Localized swelling, mass and lump, head: Secondary | ICD-10-CM | POA: Insufficient documentation

## 2012-10-26 DIAGNOSIS — I10 Essential (primary) hypertension: Secondary | ICD-10-CM | POA: Insufficient documentation

## 2012-10-26 DIAGNOSIS — R51 Headache: Secondary | ICD-10-CM | POA: Insufficient documentation

## 2012-10-26 MED ORDER — PENICILLIN V POTASSIUM 500 MG PO TABS: 500.0000 mg | ORAL_TABLET | Freq: Three times a day (TID) | ORAL | Status: DC

## 2012-10-26 NOTE — ED Provider Notes (Signed)
History     CSN: [id number]  Arrival date & time 10/26/12  1213   First MD Initiated Contact with Patient 10/26/12 1250      Chief Complaint  Patient presents with  . Facial Swelling    (Consider location/radiation/quality/duration/timing/severity/associated sxs/prior treatment) HPI Comments: Patient presents with facial pain and swelling after eating a steak sub.  His pain is located in front of the left ear.  There is no trouble with hearing and no ear pain.  Pain worse with palpation, eating, drinking.  No alleviating factors.  No fevers or chills.  The history is provided by the patient.    Past Medical History  Diagnosis Date  . Hypertension     History reviewed. No pertinent past surgical history.  No family history on file.  History  Substance Use Topics  . Smoking status: Never Smoker   . Smokeless tobacco: Not on file  . Alcohol Use: No      Review of Systems  All other systems reviewed and are negative.    Allergies  Review of patient's allergies indicates no known allergies.  Home Medications   Current Outpatient Rx  Name  Route  Sig  Dispense  Refill  . ALBUTEROL SULFATE HFA 108 (90 BASE) MCG/ACT IN AERS   Inhalation   Inhale 1-2 puffs into the lungs every 6 (six) hours as needed for wheezing.   1 Inhaler   0   . AMLODIPINE BESYLATE 10 MG PO TABS   Oral   Take 1 tablet (10 mg total) by mouth daily.   30 tablet   3   . AMLODIPINE BESYLATE 10 MG PO TABS   Oral   Take 1 tablet (10 mg total) by mouth daily.   30 tablet   0   . HYDROCHLOROTHIAZIDE 25 MG PO TABS   Oral   Take 1 tablet (25 mg total) by mouth daily.   30 tablet   3   . HYDROCHLOROTHIAZIDE 25 MG PO TABS   Oral   Take 1 tablet (25 mg total) by mouth daily.   30 tablet   0   . LISINOPRIL 20 MG PO TABS   Oral   Take 1 tablet (20 mg total) by mouth daily.   30 tablet   3   . LISINOPRIL 20 MG PO TABS   Oral   Take 1 tablet (20 mg total) by mouth daily.   30  tablet   0     BP 178/97  Pulse 91  Temp 98 F (36.7 C) (Oral)  Resp 16  SpO2 99%  Physical Exam  Nursing note and vitals reviewed. Constitutional: He is oriented to person, place, and time. He appears well-developed and well-nourished. No distress.  HENT:  Head: Normocephalic and atraumatic.  Mouth/Throat: Oropharynx is clear and moist.       Bilateral TM's are without erythema.  Neck: Normal range of motion. Neck supple.  Abdominal: Soft. Bowel sounds are normal.  Musculoskeletal: Normal range of motion.  Neurological: He is alert and oriented to person, place, and time.  Skin: Skin is warm and dry. He is not diaphoretic.    ED Course  Procedures (including critical care time)  Labs Reviewed - No data to display No results found.   No diagnosis found.    MDM  The patient presents with facial pain and swelling that is either dental in nature or related to parotid gland.  I will prescribe pcn, recommend sialogogues if the swelling recurs.  Geoffery Lyons, MD 10/26/12 1311

## 2012-10-26 NOTE — ED Notes (Signed)
Pt c/o left sided facial swelling and left ear pain since today. Pt denies dental pain.

## 2012-12-03 ENCOUNTER — Encounter (HOSPITAL_BASED_OUTPATIENT_CLINIC_OR_DEPARTMENT_OTHER): Payer: Self-pay

## 2012-12-03 ENCOUNTER — Emergency Department (HOSPITAL_BASED_OUTPATIENT_CLINIC_OR_DEPARTMENT_OTHER)
Admission: EM | Admit: 2012-12-03 | Discharge: 2012-12-03 | Disposition: A | Discharge status: Home / Self Care | Payer: Self-pay | Attending: Emergency Medicine | Admitting: Emergency Medicine

## 2012-12-03 DIAGNOSIS — R079 Chest pain, unspecified: Secondary | ICD-10-CM | POA: Insufficient documentation

## 2012-12-03 DIAGNOSIS — I10 Essential (primary) hypertension: Secondary | ICD-10-CM | POA: Insufficient documentation

## 2012-12-03 DIAGNOSIS — R091 Pleurisy: Secondary | ICD-10-CM

## 2012-12-03 MED ORDER — OMEPRAZOLE 20 MG PO CPDR: 40.0000 mg | DELAYED_RELEASE_CAPSULE | Freq: Every day | ORAL | Status: DC

## 2012-12-03 NOTE — Discharge Instructions (Signed)
Pleurisy Pleurisy is an inflammation and swelling of the lining of the lungs. It usually is the result of an underlying infection or other disease. Because of this inflammation, it hurts to breathe. It is aggravated by coughing or deep breathing. The primary goal in treating pleurisy is to diagnose and treat the condition that caused it.  HOME CARE INSTRUCTIONS   Only take over-the-counter or prescription medicines for pain, discomfort, or fever as directed by your caregiver.  If medications which kill germs (antibiotics) were prescribed, take the entire course. Even if you are feeling better, you need to take them.  Use a cool mist vaporizer to help loosen secretions. This is so the secretions can be coughed up more easily. SEEK MEDICAL CARE IF:   Your pain is not controlled with medication or is increasing.  You have an increase inpus like (purulent) secretions brought up with coughing. SEEK IMMEDIATE MEDICAL CARE IF:   You have blue or dark lips, fingernails, or toenails.  You begin coughing up blood.  You have increased difficulty breathing.  You have continuing pain unrelieved by medicine or lasting more than 1 week.  You have pain that radiates into your neck, arms, or jaw.  You develop increased shortness of breath or wheezing.  You develop a fever, rash, vomiting, fainting, or other serious complaints. Document Released: 11/21/2005 Document Revised: 02/13/2012 Document Reviewed: 06/22/2007 [employer] Patient Information 2013 [employer].  

## 2012-12-03 NOTE — ED Notes (Signed)
MD at bedside. 

## 2012-12-03 NOTE — ED Provider Notes (Signed)
History     CSN: [id number]  Arrival date & time 12/03/12  0800   First MD Initiated Contact with Patient 12/03/12 [phone]      Chief Complaint  Patient presents with  . Pleurisy    (Consider location/radiation/quality/duration/timing/severity/associated sxs/prior treatment) HPI Comments: Patient is a 36 year old male with a history of hypertension and prior history of bronchitis who presents to to episode of sharp chest pain occurring in his left upper rib cage and axillary area this morning while at work. 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 He reports the pain generally stays in that one area and does worsen with a deep breath but none assailant with movement. He also reports he occasionally gets a pinpoint pain in his left upper chest wall which he can rub and eventually Sudafed. He reports no specific shortness of breath, sweating, vomiting associated with this discomfort. He reports that he has had this pain very frequently, chronically. During the history and exam the pain has steadily improved and now is no longer present. He denies coughing, cold symptoms, nasal drainage, sore throat. He reports that he is a nonsmoker and has no significant family history of coronary disease. He denies any recent exertional shortness of breath or chest pain. No dizziness or lightheadedness.  The history is provided by the patient and medical records.    Past Medical History  Diagnosis Date  . Hypertension     History reviewed. No pertinent past surgical history.  No family history on file.  History  Substance Use Topics  . Smoking status: Never Smoker   . Smokeless tobacco: Not on file  . Alcohol Use: No      Review of Systems  Constitutional: Negative for fever.  HENT: Negative for congestion, sore throat and rhinorrhea.   Respiratory: Negative for cough, chest tightness and shortness of breath.   Cardiovascular: Positive for chest  pain. Negative for palpitations and leg swelling.  Gastrointestinal: Negative for nausea, vomiting, abdominal pain and diarrhea.  Genitourinary: Negative for dysuria and flank pain.  Skin: Negative for rash.  Neurological: Negative for dizziness, light-headedness and headaches.  All other systems reviewed and are negative.    Allergies  Review of patient's allergies indicates no known allergies.  Home Medications   Current Outpatient Rx  Name  Route  Sig  Dispense  Refill  . ALBUTEROL SULFATE HFA 108 (90 BASE) MCG/ACT IN AERS   Inhalation   Inhale 1-2 puffs into the lungs every 6 (six) hours as needed for wheezing.   1 Inhaler   0   . AMLODIPINE BESYLATE 10 MG PO TABS   Oral   Take 1 tablet (10 mg total) by mouth daily.   30 tablet   3   . HYDROCHLOROTHIAZIDE 25 MG PO TABS   Oral   Take 1 tablet (25 mg total) by mouth daily.   30 tablet   3   . LISINOPRIL 20 MG PO TABS   Oral   Take 1 tablet (20 mg total) by mouth daily.   30 tablet   3   . OMEPRAZOLE 20 MG PO CPDR   Oral   Take 2 capsules (40 mg total) by mouth daily.   28 capsule   0     BP 146/87  Pulse 98  Temp 98.4 F (36.9 C) (Oral)  Resp 18  Ht 6\' 1"  (1.854 m)  Wt 294 lb (133.358 kg)  BMI 38.79 kg/m2  SpO2 100%  Physical Exam  Nursing note and vitals reviewed. Constitutional: He is oriented to person, place, and time. He appears well-developed and well-nourished.  HENT:  Head: Normocephalic and atraumatic.  Eyes: Pupils are equal, round, and reactive to light. No scleral icterus.  Neck: Normal range of motion. Neck supple.  Cardiovascular: Normal rate and regular rhythm.   No murmur heard. Pulmonary/Chest: Effort normal and breath sounds normal. No respiratory distress. He has no wheezes. He exhibits no tenderness, no bony tenderness, no edema, no deformity and no swelling.    Abdominal: Soft. He exhibits no distension. There is no tenderness. There is no rebound and no CVA tenderness.    Musculoskeletal: He exhibits no tenderness.  Neurological: He is alert and oriented to person, place, and time.  Skin: Skin is warm and dry. No rash noted.  Psychiatric: He has a normal mood and affect.    ED Course  Procedures (including critical care time)  Labs Reviewed - No data to display No results found.   1. Pleurisy     Room air saturation is 100% and I interpret this to be normal.  MDM   Chest pain is atypical given the sharp nature, specific small region of pain and no associated diaphoresis, nausea or shortness of breath. The symptoms have resolved upon arrival here. The patient is a nonsmoker and other than hypertension has no other cardiac risk factors. There no risk factors for DVT or PE. He is not tachycardic and not hypoxic. I suspect the symptoms are either pleuritic pain or symptoms related to reflux as the patient seems rather convinced that the symptoms occur after eating and drinking certain foods. My plan is to start him on Prilosec and have advised him to take Gas-X if symptoms continue to bother him on an intermittent basis. He can followup as an outpatient with her primary care physician for additional symptoms. I reviewed his multiple prior ED visits over the last 2 months.        Gavin Pound. Oletta Lamas, MD 12/03/12 [phone]

## 2012-12-03 NOTE — ED Notes (Signed)
Pt reports left chest wall pain radiating to left rib area intermittently x 1 year, worsening the past month.  Pain is described as sharp and worsens with a deep breath.

## 2013-01-28 ENCOUNTER — Encounter (HOSPITAL_BASED_OUTPATIENT_CLINIC_OR_DEPARTMENT_OTHER): Payer: Self-pay | Admitting: *Deleted

## 2013-01-28 ENCOUNTER — Emergency Department (HOSPITAL_BASED_OUTPATIENT_CLINIC_OR_DEPARTMENT_OTHER)
Admission: EM | Admit: 2013-01-28 | Discharge: 2013-01-28 | Disposition: A | Discharge status: Home / Self Care | Payer: Managed Care, Other (non HMO) | Attending: Emergency Medicine | Admitting: Emergency Medicine

## 2013-01-28 DIAGNOSIS — G243 Spasmodic torticollis: Secondary | ICD-10-CM

## 2013-01-28 DIAGNOSIS — Z79899 Other long term (current) drug therapy: Secondary | ICD-10-CM | POA: Insufficient documentation

## 2013-01-28 DIAGNOSIS — I10 Essential (primary) hypertension: Secondary | ICD-10-CM | POA: Insufficient documentation

## 2013-01-28 MED ORDER — CYCLOBENZAPRINE HCL 10 MG PO TABS: 10.0000 mg | ORAL_TABLET | Freq: Three times a day (TID) | ORAL | Status: DC | PRN

## 2013-01-28 MED ORDER — KETOROLAC TROMETHAMINE 60 MG/2ML IM SOLN
60.0000 mg | Freq: Once | INTRAMUSCULAR | Status: AC
  Administered 2013-01-28: 60 mg via INTRAMUSCULAR
  Filled 2013-01-28: qty 2

## 2013-01-28 MED ORDER — HYDROCODONE-ACETAMINOPHEN 5-325 MG PO TABS: ORAL_TABLET | ORAL | Status: DC

## 2013-01-28 NOTE — ED Notes (Signed)
Neck pain back of neck and upper back states he has a sensation of everything being tight states he was playing a video game at 0500 this am when pain and tightness started pain worse when trying to turn head

## 2013-01-28 NOTE — Discharge Instructions (Signed)
Torticollis, Acute You have suddenly (acutely) developed a twisted neck (torticollis). This is usually a self-limited condition. CAUSES  Acute torticollis may be caused by malposition, trauma or infection. Most commonly, acute torticollis is caused by sleeping in an awkward position. Torticollis may also be caused by the flexion, extension or twisting of the neck muscles beyond their normal position. Sometimes, the exact cause may not be known. SYMPTOMS  Usually, there is pain and limited movement of the neck. Your neck may twist to one side. DIAGNOSIS  The diagnosis is often made by physical examination. X-rays, CT scans or MRIs may be done if there is a history of trauma or concern of infection. TREATMENT  For a common, stiff neck that develops during sleep, treatment is focused on relaxing the contracted neck muscle. Medications (including shots) may be used to treat the problem. Most cases resolve in several days. Torticollis usually responds to conservative physical therapy. If left untreated, the shortened and spastic neck muscle can cause deformities in the face and neck. Rarely, surgery is required. HOME CARE INSTRUCTIONS   Use over-the-counter and prescription medications as directed by your caregiver.  Do stretching exercises and massage the neck as directed by your caregiver.  Follow up with physical therapy if needed and as directed by your caregiver. SEEK IMMEDIATE MEDICAL CARE IF:   You develop difficulty breathing or noisy breathing (stridor).  You drool, develop trouble swallowing or have pain with swallowing.  You develop numbness or weakness in the hands or feet.  You have changes in speech or vision.  You have problems with urination or bowel movements.  You have difficulty walking.  You have a fever.  You have increased pain. MAKE SURE YOU:   Understand these instructions.  Will watch your condition.  Will get help right away if you are not doing well or  get worse. Document Released: 11/18/2000 Document Revised: 02/13/2012 Document Reviewed: 12/30/2009 [employer] Patient Information 2013 [employer].  

## 2013-01-28 NOTE — ED Provider Notes (Signed)
History     CSN: [id number]  Arrival date & time 01/28/13  1031   First MD Initiated Contact with Patient 01/28/13 1057      Chief Complaint  Patient presents with  . Neck Pain    (Consider location/radiation/quality/duration/timing/severity/associated sxs/prior treatment) HPI Comments: 37 yo male presents with neck pain x 6 hours. He describes pain at the back of the neck that radiates down to his left shoulder. The pain is constant and throbbing and is progressively worsening. The pain worsens with movement. He describes the pain coming on after a period of playing video games. He has tried ibuprofen 800 mg to no relief. He has never had anything like this before and he rates the pain as an 8-9/10. He is compliant with all of his medications.  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 However, he did not work the night of onset of pain and very minimally the night before.  He is scheduled to work this evening. He reports he is eating having trouble driving because in order to turn to look around he has to bend his entire torso. He denies any recent trauma, injury, heavy exertion, headache, fever, chills, shortness of breath and dizziness.   Patient is a 37 y.o. male presenting with neck pain. The history is provided by the patient.  Neck Pain Associated symptoms: no fever and no headaches     Past Medical History  Diagnosis Date  . Hypertension     History reviewed. No pertinent past surgical history.  History reviewed. No pertinent family history.  History  Substance Use Topics  . Smoking status: Never Smoker   . Smokeless tobacco: Not on file  . Alcohol Use: No      Review of Systems  Constitutional: Negative for fever and chills.  HENT: Positive for neck pain and neck stiffness.   Musculoskeletal: Negative for back pain.  Neurological: Negative for dizziness and headaches.    Allergies  Review of patient's allergies indicates no  known allergies.  Home Medications   Current Outpatient Rx  Name  Route  Sig  Dispense  Refill  . albuterol (PROVENTIL HFA;VENTOLIN HFA) 108 (90 BASE) MCG/ACT inhaler   Inhalation   Inhale 1-2 puffs into the lungs every 6 (six) hours as needed for wheezing.   1 Inhaler   0   . amLODipine (NORVASC) 10 MG tablet   Oral   Take 1 tablet (10 mg total) by mouth daily.   30 tablet   3   . cyclobenzaprine (FLEXERIL) 10 MG tablet   Oral   Take 1 tablet (10 mg total) by mouth 3 (three) times daily as needed for muscle spasms.   15 tablet   0   . hydrochlorothiazide (HYDRODIURIL) 25 MG tablet   Oral   Take 1 tablet (25 mg total) by mouth daily.   30 tablet   3   . HYDROcodone-acetaminophen (NORCO/VICODIN) 5-325 MG per tablet      1-2 tablets po q 6 hours prn moderate to severe pain   20 tablet   0   . lisinopril (PRINIVIL,ZESTRIL) 20 MG tablet   Oral   Take 1 tablet (20 mg total) by mouth daily.   30 tablet   3   . omeprazole (PRILOSEC) 20 MG capsule   Oral   Take 2 capsules (40 mg total) by mouth daily.   28 capsule   0     BP 163/103  Pulse 87  Temp(Src) 97.8 F (36.6 C) (Oral)  Resp 20  Ht 6\' 1"  (1.854 m)  Wt 297 lb (134.718 kg)  BMI 39.19 kg/m2  SpO2 99%  Physical Exam  Nursing note and vitals reviewed. Constitutional: He is oriented to person, place, and time. He appears well-developed and well-nourished.  Neck: Normal range of motion. Neck supple.  ROM nl, but limited due to pain. No nuchal rigidity.  Cardiovascular: Normal rate, regular rhythm and normal heart sounds.  Exam reveals no gallop and no friction rub.   No murmur heard. Pulmonary/Chest: Effort normal and breath sounds normal. No respiratory distress.  Musculoskeletal: Normal range of motion. He exhibits tenderness. He exhibits no edema.  Reproduction of pain with palpation of left trapezius and deep scalene musculature.  Lymphadenopathy:    He has no cervical adenopathy.  Neurological: He  is alert and oriented to person, place, and time. No cranial nerve deficit.  Skin: Skin is warm and dry. He is not diaphoretic.  Psychiatric: He has a normal mood and affect. His behavior is normal. Thought content normal.    ED Course  Procedures (including critical care time)  Labs Reviewed - No data to display No results found.   1. Torticollis, spasmodic     Room air saturation is 99% and I interpret this to be normal.  MDM   Patient with reproducible musculoskeletal pain in his upper left back, posterior shoulder region joining with his left paraspinal neck region. Neck is supple, there is no traumatic injury, patient is afebrile with no rash. I suspect this is torticollis and musculoskeletal pain. I've recommended that the patient use also rub, heat packs and will prescribe him some stronger analgesics and muscle relaxants for home.        Gavin Pound. Tobe Kervin, MD 01/28/13 1156

## 2013-02-15 ENCOUNTER — Emergency Department (HOSPITAL_BASED_OUTPATIENT_CLINIC_OR_DEPARTMENT_OTHER)
Admission: EM | Admit: 2013-02-15 | Discharge: 2013-02-15 | Disposition: A | Discharge status: Home / Self Care | Payer: Managed Care, Other (non HMO) | Attending: Emergency Medicine | Admitting: Emergency Medicine

## 2013-02-15 ENCOUNTER — Encounter (HOSPITAL_BASED_OUTPATIENT_CLINIC_OR_DEPARTMENT_OTHER): Payer: Self-pay

## 2013-02-15 DIAGNOSIS — Z760 Encounter for issue of repeat prescription: Secondary | ICD-10-CM | POA: Insufficient documentation

## 2013-02-15 DIAGNOSIS — I10 Essential (primary) hypertension: Secondary | ICD-10-CM | POA: Insufficient documentation

## 2013-02-15 DIAGNOSIS — R51 Headache: Secondary | ICD-10-CM | POA: Insufficient documentation

## 2013-02-15 DIAGNOSIS — Z79899 Other long term (current) drug therapy: Secondary | ICD-10-CM | POA: Insufficient documentation

## 2013-02-15 DIAGNOSIS — K219 Gastro-esophageal reflux disease without esophagitis: Secondary | ICD-10-CM | POA: Insufficient documentation

## 2013-02-15 HISTORY — DX: Gastro-esophageal reflux disease without esophagitis: K21.9

## 2013-02-15 MED ORDER — AMLODIPINE BESYLATE 10 MG PO TABS: 10.0000 mg | ORAL_TABLET | Freq: Every day | ORAL | Status: DC

## 2013-02-15 MED ORDER — HYDROCHLOROTHIAZIDE 25 MG PO TABS: 25.0000 mg | ORAL_TABLET | Freq: Every day | ORAL | Status: DC

## 2013-02-15 MED ORDER — OMEPRAZOLE 20 MG PO CPDR: 20.0000 mg | DELAYED_RELEASE_CAPSULE | Freq: Two times a day (BID) | ORAL | Status: DC

## 2013-02-15 MED ORDER — LISINOPRIL 20 MG PO TABS: 20.0000 mg | ORAL_TABLET | Freq: Every day | ORAL | Status: DC

## 2013-02-15 NOTE — ED Notes (Signed)
Pt reports a headache and hypertension since Wednesday.  He is out of medication x 3 days.

## 2013-02-16 NOTE — ED Provider Notes (Signed)
History     CSN: [id number]  Arrival date & time 02/15/13  1213   First MD Initiated Contact with Patient 02/15/13 1346      Chief Complaint  Patient presents with  . Headache  . Hypertension    (Consider location/radiation/quality/duration/timing/severity/associated sxs/prior treatment) Patient is a 37 y.o. male presenting with headaches. The history is provided by the patient.  Headache Pain location:  Generalized Quality:  Dull Radiates to:  Does not radiate Onset quality:  Gradual Duration:  3 days Timing:  Constant Progression:  Worsening Chronicity:  New Context: not activity and not exposure to bright light   Relieved by:  Nothing Exacerbated by: ran out meds, blood pressure elevated. Ineffective treatments:  None tried Associated symptoms: no blurred vision, no fever, no photophobia, no seizures and no sinus pressure     Past Medical History  Diagnosis Date  . Hypertension   . GERD (gastroesophageal reflux disease)     History reviewed. No pertinent past surgical history.  No family history on file.  History  Substance Use Topics  . Smoking status: Never Smoker   . Smokeless tobacco: Not on file  . Alcohol Use: No      Review of Systems  Constitutional: Negative for fever.  HENT: Negative for sinus pressure.   Eyes: Negative for blurred vision and photophobia.  Neurological: Positive for headaches. Negative for seizures.  All other systems reviewed and are negative.    Allergies  Review of patient's allergies indicates no known allergies.  Home Medications   Current Outpatient Rx  Name  Route  Sig  Dispense  Refill  . amLODipine (NORVASC) 10 MG tablet   Oral   Take 1 tablet (10 mg total) by mouth daily.   30 tablet   3   . amLODipine (NORVASC) 10 MG tablet   Oral   Take 1 tablet (10 mg total) by mouth daily.   30 tablet   2   . hydrochlorothiazide (HYDRODIURIL) 25 MG tablet   Oral   Take 1 tablet (25 mg total) by mouth daily.   30 tablet   3   . hydrochlorothiazide (HYDRODIURIL) 25 MG tablet   Oral   Take 1 tablet (25 mg total) by mouth daily.   60 tablet   2   . lisinopril (PRINIVIL,ZESTRIL) 20 MG tablet   Oral   Take 1 tablet (20 mg total) by mouth daily.   30 tablet   3   . lisinopril (PRINIVIL,ZESTRIL) 20 MG tablet   Oral   Take 1 tablet (20 mg total) by mouth daily.   30 tablet   2   . omeprazole (PRILOSEC) 20 MG capsule   Oral   Take 2 capsules (40 mg total) by mouth daily.   28 capsule   0   . omeprazole (PRILOSEC) 20 MG capsule   Oral   Take 1 capsule (20 mg total) by mouth 2 (two) times daily.   60 capsule   2     BP 151/92  Pulse 79  Temp(Src) 98.7 F (37.1 C) (Oral)  Resp 18  Ht 6\' 1"  (1.854 m)  Wt 297 lb (134.718 kg)  BMI 39.19 kg/m2  SpO2 100%  Physical Exam  Nursing note and vitals reviewed. Constitutional: He is oriented to person, place, and time. He appears well-developed and well-nourished. No distress.  HENT:  Head: Normocephalic and atraumatic.  Mouth/Throat: Oropharynx is clear and moist.  Eyes: EOM are normal. Pupils are equal, round, and reactive to  light.  No papilledema upon fundoscopic exam.  Neck: Normal range of motion. Neck supple.  Cardiovascular: Normal rate and regular rhythm.   No murmur heard. Pulmonary/Chest: Effort normal and breath sounds normal.  Abdominal: Soft. Bowel sounds are normal.  Lymphadenopathy:    He has no cervical adenopathy.  Neurological: He is alert and oriented to person, place, and time. No cranial nerve deficit. He exhibits normal muscle tone. Coordination normal.  Skin: Skin is warm and dry. He is not diaphoretic.    ED Course  Procedures (including critical care time)  Labs Reviewed - No data to display No results found.   1. Hypertension       MDM  Patient requests refills of medications.  His blood pressure is mildly elevated, but the exam is non-focal.   Will refill his medications until he can get in  to see his pcp.        Geoffery Lyons, MD 02/16/13 [phone]

## 2013-05-17 ENCOUNTER — Emergency Department (HOSPITAL_BASED_OUTPATIENT_CLINIC_OR_DEPARTMENT_OTHER)
Admission: EM | Admit: 2013-05-17 | Discharge: 2013-05-17 | Disposition: A | Discharge status: Home / Self Care | Payer: Managed Care, Other (non HMO) | Attending: Emergency Medicine | Admitting: Emergency Medicine

## 2013-05-17 ENCOUNTER — Encounter (HOSPITAL_BASED_OUTPATIENT_CLINIC_OR_DEPARTMENT_OTHER): Payer: Self-pay

## 2013-05-17 DIAGNOSIS — Z202 Contact with and (suspected) exposure to infections with a predominantly sexual mode of transmission: Secondary | ICD-10-CM | POA: Insufficient documentation

## 2013-05-17 DIAGNOSIS — R51 Headache: Secondary | ICD-10-CM | POA: Insufficient documentation

## 2013-05-17 DIAGNOSIS — R519 Headache, unspecified: Secondary | ICD-10-CM

## 2013-05-17 DIAGNOSIS — I10 Essential (primary) hypertension: Secondary | ICD-10-CM | POA: Insufficient documentation

## 2013-05-17 DIAGNOSIS — K219 Gastro-esophageal reflux disease without esophagitis: Secondary | ICD-10-CM | POA: Insufficient documentation

## 2013-05-17 DIAGNOSIS — Z79899 Other long term (current) drug therapy: Secondary | ICD-10-CM | POA: Insufficient documentation

## 2013-05-17 MED ORDER — METRONIDAZOLE 500 MG PO TABS: 500.0000 mg | ORAL_TABLET | Freq: Two times a day (BID) | ORAL | Status: DC

## 2013-05-17 NOTE — ED Notes (Signed)
Pt now states that the real reason he is seeking care today is his girlfriend called him and said she had chlamydia and he needs to be treated . Pt denies any symptoms

## 2013-05-17 NOTE — ED Provider Notes (Addendum)
History     CSN: [id number]  Arrival date & time 05/17/13  5621   First MD Initiated Contact with Patient 05/17/13 [phone]      Chief Complaint  Patient presents with  . Headache    (Consider location/radiation/quality/duration/timing/severity/associated sxs/prior treatment) HPI Comments: Also reports a sexual contact informed his she was diagnosed with trichomonas.  He is asymptomatic.  Patient is a 37 y.o. male presenting with headaches. The history is provided by the patient.  Headache Location: top of scalp. Quality:  Sharp Radiates to:  Does not radiate Onset quality:  Sudden Duration:  2 days Timing:  Constant Progression:  Unchanged Chronicity:  New Relieved by:  Nothing Worsened by:  Nothing tried Ineffective treatments:  None tried   Past Medical History  Diagnosis Date  . Hypertension   . GERD (gastroesophageal reflux disease)     History reviewed. No pertinent past surgical history.  No family history on file.  History  Substance Use Topics  . Smoking status: Never Smoker   . Smokeless tobacco: Not on file  . Alcohol Use: No      Review of Systems  Neurological: Positive for headaches.  All other systems reviewed and are negative.    Allergies  Review of patient's allergies indicates no known allergies.  Home Medications   Current Outpatient Rx  Name  Route  Sig  Dispense  Refill  . amLODipine (NORVASC) 10 MG tablet   Oral   Take 1 tablet (10 mg total) by mouth daily.   30 tablet   2   . hydrochlorothiazide (HYDRODIURIL) 25 MG tablet   Oral   Take 1 tablet (25 mg total) by mouth daily.   30 tablet   3   . lisinopril (PRINIVIL,ZESTRIL) 20 MG tablet   Oral   Take 1 tablet (20 mg total) by mouth daily.   30 tablet   3   . omeprazole (PRILOSEC) 20 MG capsule   Oral   Take 1 capsule (20 mg total) by mouth 2 (two) times daily.   60 capsule   2     BP 143/91  Pulse 90  Temp(Src) 98.6 F (37 C) (Oral)  Resp 20  SpO2  99%  Physical Exam  Nursing note and vitals reviewed. Constitutional: He is oriented to person, place, and time. He appears well-developed and well-nourished. No distress.  HENT:  Head: Normocephalic and atraumatic.  Mouth/Throat: Oropharynx is clear and moist.  The scalp is tender to the touch but appears otherwise unremarkable.  There are no lesions, swelling, or erythema.  Neurological: He is alert and oriented to person, place, and time.  Skin: Skin is warm and dry. He is not diaphoretic.    ED Course  Procedures (including critical care time)  Labs Reviewed - No data to display No results found.   No diagnosis found.    MDM  As he has no symptoms and the sexual encounter was one month ago, I will forego further testing and treat with flagyl for the trich.  I see no reason the scalp pain.  I see no lesions or other abnormality and no indication for ct or further workup into this at this point.  He did have a head ct about a year ago that was negative.        Geoffery Lyons, MD 05/17/13 5784  Geoffery Lyons, MD 05/17/13 [phone]

## 2013-05-17 NOTE — ED Notes (Signed)
Pt reports scalp sensitivity and intermittent sharp pains in head x 2 days.

## 2013-06-19 IMAGING — CT CT HEAD W/O CM
1 series · 16 of 30 positions shown, 20 images · non-contrast
Comparison: None.

CLINICAL DATA: Headache, dizziness

CT HEAD WITHOUT CONTRAST
TECHNIQUE: Contiguous axial images were obtained from the base of
the skull through the vertex without contrast.

[Series 2: head 4.8 h37s · axial · 0.51mm/px · z∈[+999,+1159]mm · 16 of 36 slices shown, 20 images]
[im 2/36  brain]
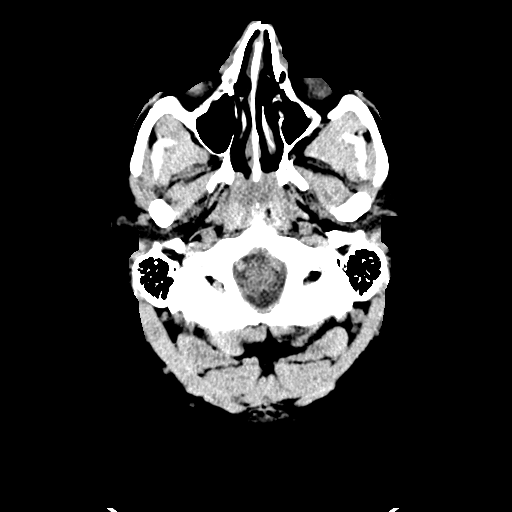
[im 2/36  bone]
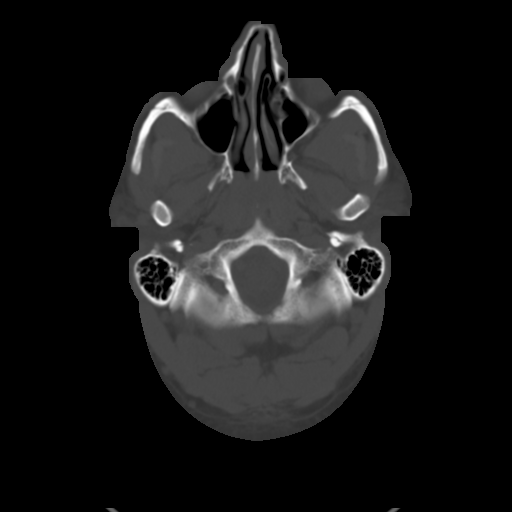
[im 4/36  brain]
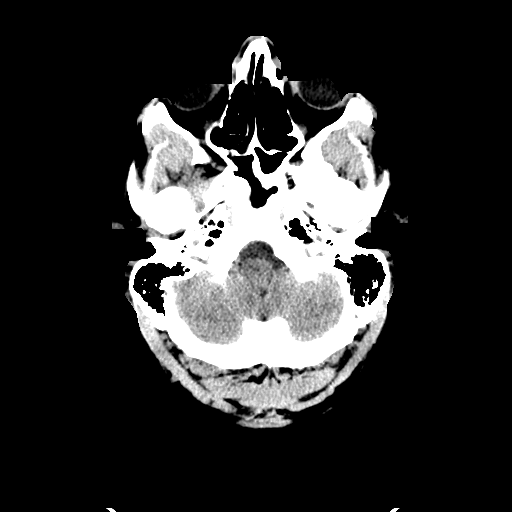
[im 7/36  brain]
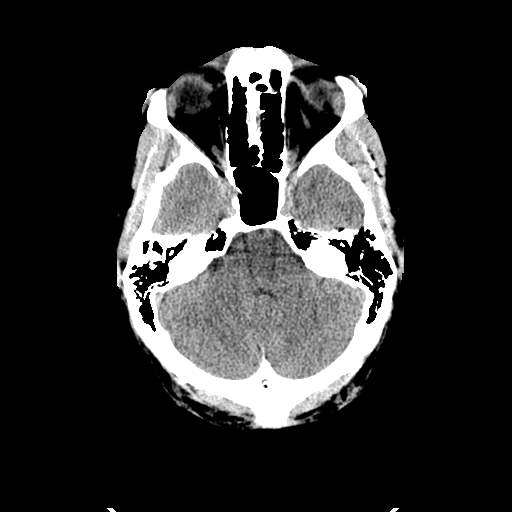
[im 9/36  brain]
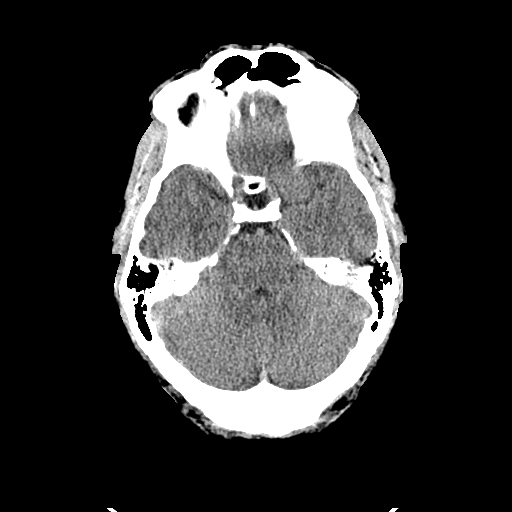
[im 10/36  brain]
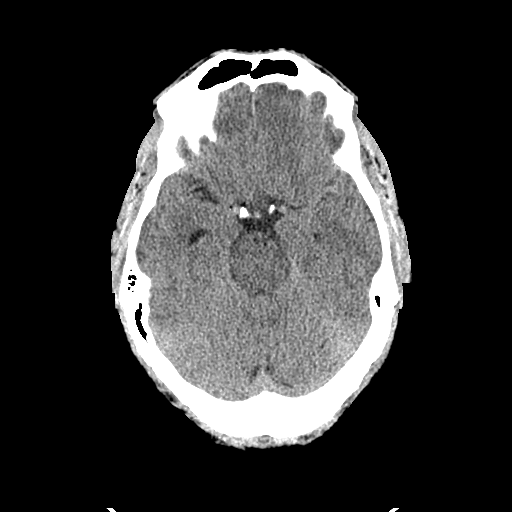
[im 10/36  bone]
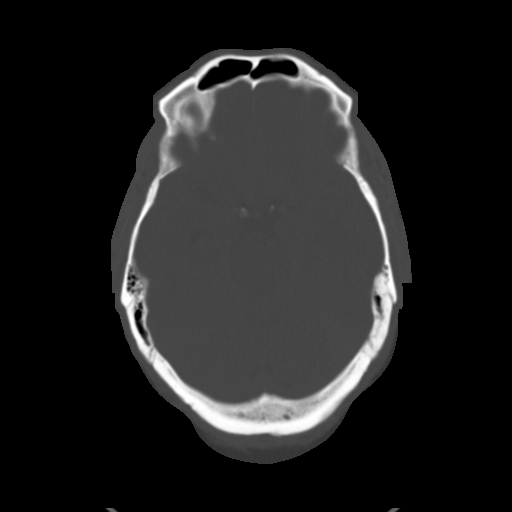
[im 13/36  brain]
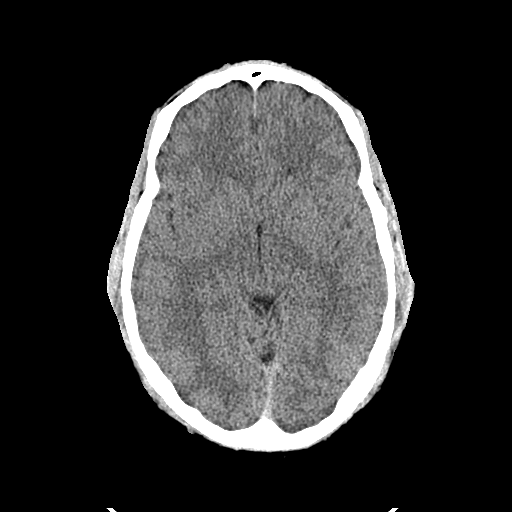
[im 15/36  brain]
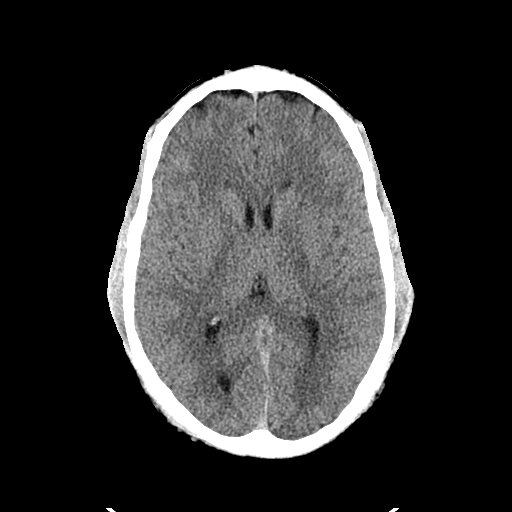
[im 17/36  brain]
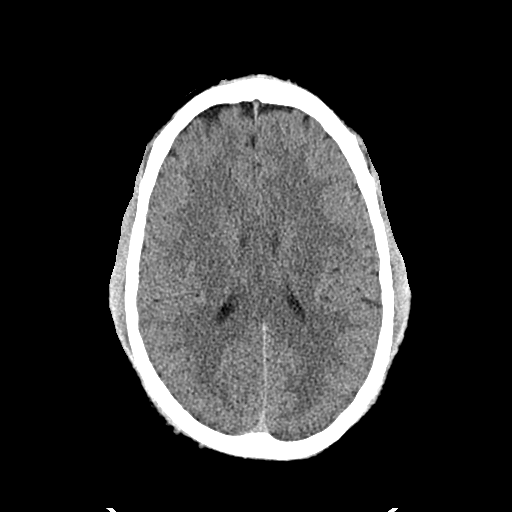
[im 19/36  brain]
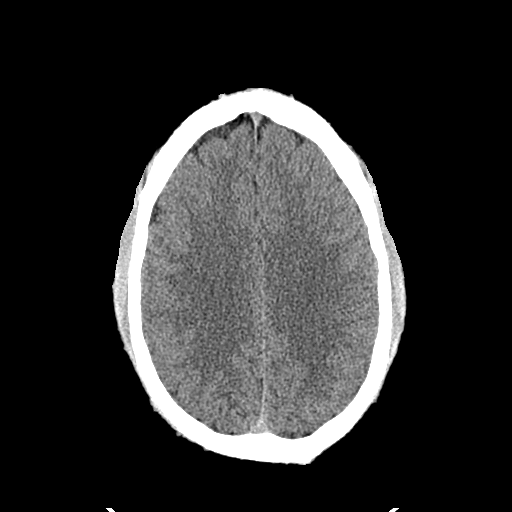
[im 19/36  bone]
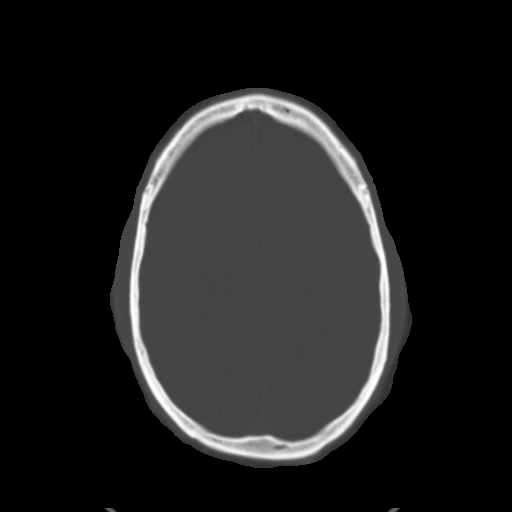
[im 21/36  brain]
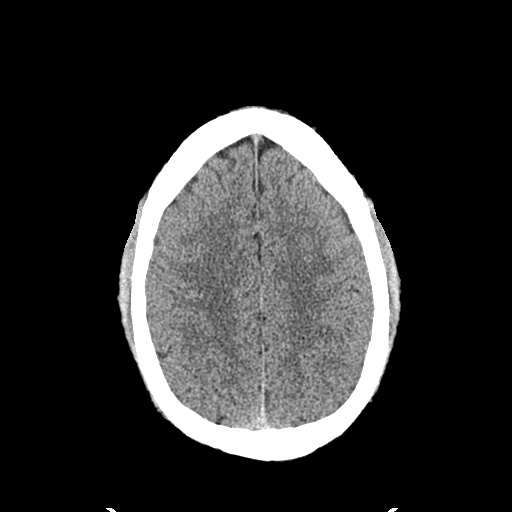
[im 23/36  brain]
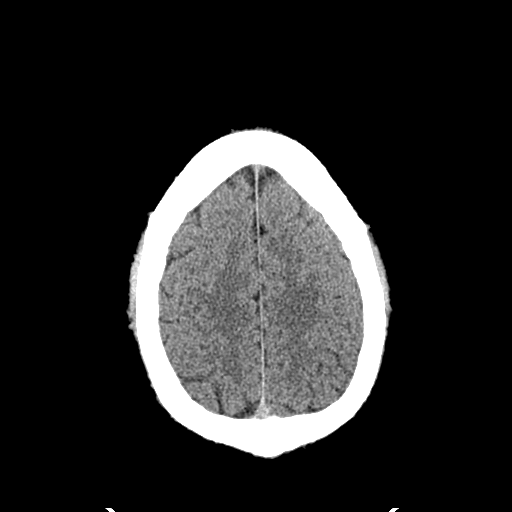
[im 26/36  brain]
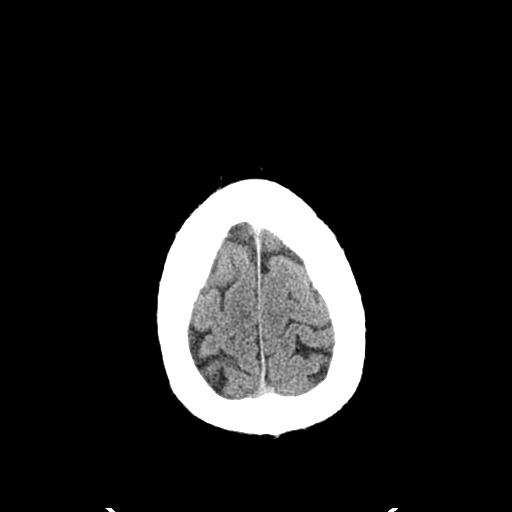
[im 27/36  brain]
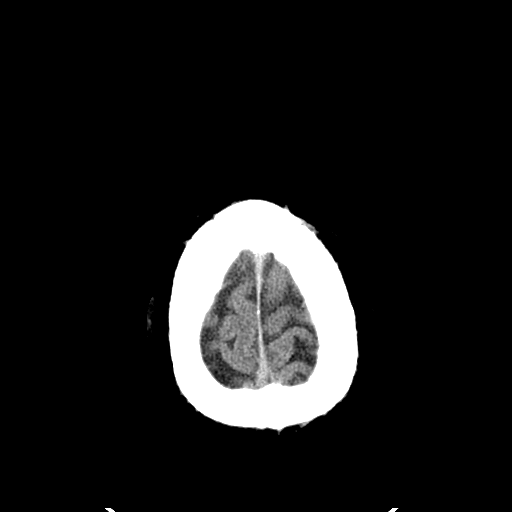
[im 27/36  bone]
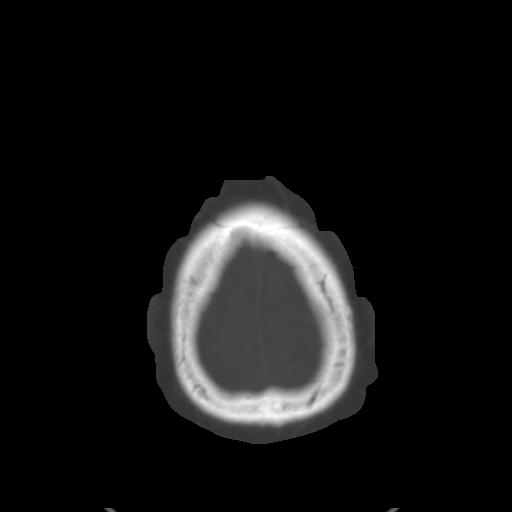
[im 29/36  brain]
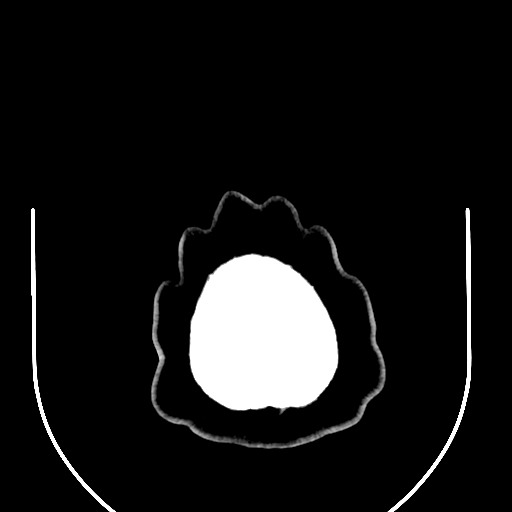
[im 32/36  brain]
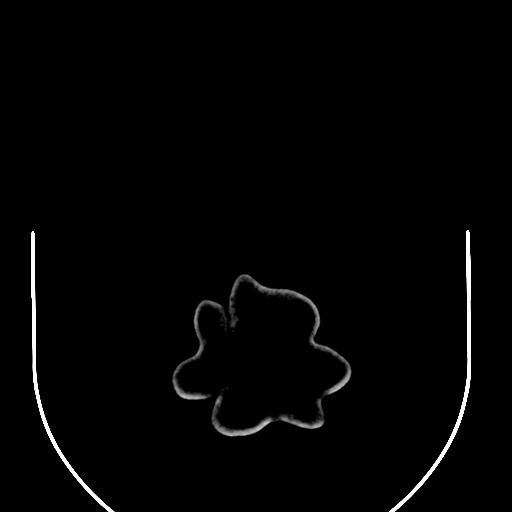
[im 34/36  brain]
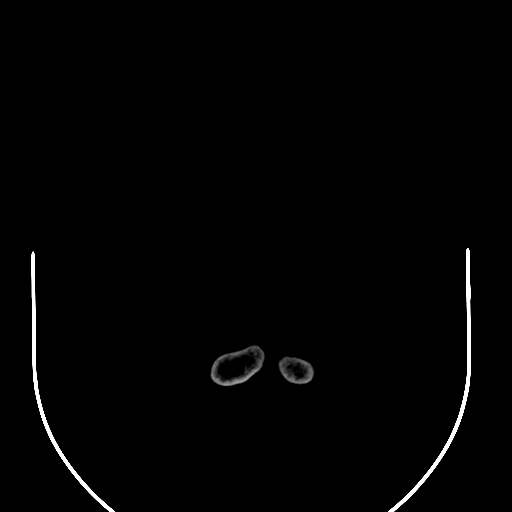

[16 of 30 positions shown; findings below may reference images not displayed]

FINDINGS: No skull fracture is noted.  Paranasal sinuses and
mastoid air cells are unremarkable.

No intracranial hemorrhage, mass effect or midline shift.

No acute infarction.  No mass lesion is noted on this unenhanced
scan.  The gray and white matter differentiation is preserved.  No
hydrocephalus.
IMPRESSION: No acute intracranial abnormality.

## 2013-07-06 ENCOUNTER — Emergency Department (HOSPITAL_BASED_OUTPATIENT_CLINIC_OR_DEPARTMENT_OTHER)
Admission: EM | Admit: 2013-07-06 | Discharge: 2013-07-06 | Disposition: A | Discharge status: Home / Self Care | Payer: Managed Care, Other (non HMO) | Attending: Emergency Medicine | Admitting: Emergency Medicine

## 2013-07-06 ENCOUNTER — Encounter (HOSPITAL_BASED_OUTPATIENT_CLINIC_OR_DEPARTMENT_OTHER): Payer: Self-pay

## 2013-07-06 DIAGNOSIS — R51 Headache: Secondary | ICD-10-CM | POA: Insufficient documentation

## 2013-07-06 DIAGNOSIS — Z79899 Other long term (current) drug therapy: Secondary | ICD-10-CM | POA: Insufficient documentation

## 2013-07-06 DIAGNOSIS — I10 Essential (primary) hypertension: Secondary | ICD-10-CM

## 2013-07-06 DIAGNOSIS — Z760 Encounter for issue of repeat prescription: Secondary | ICD-10-CM

## 2013-07-06 DIAGNOSIS — K219 Gastro-esophageal reflux disease without esophagitis: Secondary | ICD-10-CM | POA: Insufficient documentation

## 2013-07-06 DIAGNOSIS — R42 Dizziness and giddiness: Secondary | ICD-10-CM | POA: Insufficient documentation

## 2013-07-06 MED ORDER — HYDROCHLOROTHIAZIDE 25 MG PO TABS: 25.0000 mg | ORAL_TABLET | Freq: Every day | ORAL | Status: DC

## 2013-07-06 MED ORDER — LISINOPRIL 20 MG PO TABS: 20.0000 mg | ORAL_TABLET | Freq: Every day | ORAL | Status: DC

## 2013-07-06 MED ORDER — AMLODIPINE BESYLATE 10 MG PO TABS: 10.0000 mg | ORAL_TABLET | Freq: Every day | ORAL | Status: DC

## 2013-07-06 NOTE — ED Provider Notes (Signed)
  CSN: [id number]     Arrival date & time 07/06/13  4098 History     First MD Initiated Contact with Patient 07/06/13 0423     Chief Complaint  Patient presents with  . Headache   (Consider location/radiation/quality/duration/timing/severity/associated sxs/prior Treatment) HPI This is a 37 year old male with a history of hypertension. He has not taken his antihypertensives over 3 weeks. He has an appointment pending in about 3 weeks with a new primary care physician but is requesting refills in the meantime. He has been having intermittent headaches and vaguely characterized dizziness for the past several days. These occur mostly when he is active. He denies any neurologic changes, chest pain or shortness of breath. She attributes these symptoms to being out of his antihypertensives. He is here requesting refills.   Past Medical History  Diagnosis Date  . Hypertension   . GERD (gastroesophageal reflux disease)    History reviewed. No pertinent past surgical history. No family history on file. History  Substance Use Topics  . Smoking status: Never Smoker   . Smokeless tobacco: Not on file  . Alcohol Use: No    Review of Systems  All other systems reviewed and are negative.    Allergies  Review of patient's allergies indicates no known allergies.  Home Medications   Current Outpatient Rx  Name  Route  Sig  Dispense  Refill  . amLODipine (NORVASC) 10 MG tablet   Oral   Take 1 tablet (10 mg total) by mouth daily.   30 tablet   2   . hydrochlorothiazide (HYDRODIURIL) 25 MG tablet   Oral   Take 1 tablet (25 mg total) by mouth daily.   30 tablet   3   . lisinopril (PRINIVIL,ZESTRIL) 20 MG tablet   Oral   Take 1 tablet (20 mg total) by mouth daily.   30 tablet   3   . metroNIDAZOLE (FLAGYL) 500 MG tablet   Oral   Take 1 tablet (500 mg total) by mouth 2 (two) times daily. One po bid x 7 days   14 tablet   0   . omeprazole (PRILOSEC) 20 MG capsule   Oral   Take 1  capsule (20 mg total) by mouth 2 (two) times daily.   60 capsule   2    BP 141/85  Pulse 71  Temp(Src) 98.1 F (36.7 C) (Oral)  Resp 20  Ht 6\' 1"  (1.854 m)  Wt 290 lb (131.543 kg)  BMI 38.27 kg/m2  SpO2 99%  Physical Exam General: Well-developed, well-nourished male in no acute distress; appearance consistent with age of record HENT: normocephalic, atraumatic Eyes: pupils equal round and reactive to light; extraocular muscles intact Neck: supple Heart: regular rate and rhythm; no murmurs, rubs or gallops Lungs: clear to auscultation bilaterally Abdomen: soft; nondistended; nontender; bowel sounds present Extremities: No deformity; full range of motion Neurologic: Awake, alert and oriented; motor function intact in all extremities and symmetric; no facial droop; normal coordination and speech Skin: Warm and dry Psychiatric: Normal mood and affect    ED Course   Procedures (including critical care time)    MDM    Hanley Seamen, MD 07/06/13 0430

## 2013-07-06 NOTE — ED Notes (Signed)
Pt reports is on 3 bp meds, has been out for several weeks and not refilled.  Has been having ha and dizziness intermittently.

## 2013-07-06 NOTE — ED Notes (Signed)
MD at bedside. 

## 2013-09-06 ENCOUNTER — Ambulatory Visit (INDEPENDENT_AMBULATORY_CARE_PROVIDER_SITE_OTHER): Payer: Managed Care, Other (non HMO) | Admitting: Physician Assistant

## 2013-09-06 ENCOUNTER — Encounter: Payer: Self-pay | Admitting: Physician Assistant

## 2013-09-06 VITALS — BP 146/88 | HR 87 | Temp 97.9°F | Resp 18 | Ht 72.0 in | Wt 307.8 lb

## 2013-09-06 DIAGNOSIS — Z1211 Encounter for screening for malignant neoplasm of colon: Secondary | ICD-10-CM | POA: Insufficient documentation

## 2013-09-06 DIAGNOSIS — Z0000 Encounter for general adult medical examination without abnormal findings: Secondary | ICD-10-CM

## 2013-09-06 DIAGNOSIS — E669 Obesity, unspecified: Secondary | ICD-10-CM

## 2013-09-06 DIAGNOSIS — K219 Gastro-esophageal reflux disease without esophagitis: Secondary | ICD-10-CM

## 2013-09-06 DIAGNOSIS — I10 Essential (primary) hypertension: Secondary | ICD-10-CM

## 2013-09-06 MED ORDER — HYDROCHLOROTHIAZIDE 25 MG PO TABS: 25.0000 mg | ORAL_TABLET | Freq: Every day | ORAL | Status: DC

## 2013-09-06 MED ORDER — OMEPRAZOLE 20 MG PO CPDR: 20.0000 mg | DELAYED_RELEASE_CAPSULE | Freq: Two times a day (BID) | ORAL | Status: DC

## 2013-09-06 MED ORDER — AMLODIPINE BESYLATE 10 MG PO TABS: 10.0000 mg | ORAL_TABLET | Freq: Every day | ORAL | Status: DC

## 2013-09-06 MED ORDER — LISINOPRIL 20 MG PO TABS: 20.0000 mg | ORAL_TABLET | Freq: Every day | ORAL | Status: DC

## 2013-09-06 NOTE — Progress Notes (Signed)
Patient ID: Robert Cruz, male   DOB: November 28, 1976, 37 y.o.   MRN: [id number]  Patient presents to clinic today to establish care.  Acute Concerns:   Patient has no acute concerns at today's visit.  Is requesting medication refills of anti-hypertensive medications  Chronic Issues: (1) Hypertension -- Patient endorse diagnosis in 2003.  Has been taking medications as prescribed for the past 2 years.  On amlodipine, HCTZ, Lisinopril.  Has not taken medication this morning.  Denies chest pain, lightheadedness, dizziness, palpitations.  Occasional headache.    (2) Acid Reflux -- Moderately controlled with omeprazole twice daily. Occasional breakthrough symptoms with occasional epigastric and LUQ tenderness.  Is obese and has poor diet.  Endorses he knows that weight loss would help his medical conditions and he wants information on good diet and weight loss options.  Rare alcohol consumption.  Non-smoker.  Denies NSAID use.  No family history of gastric issues.  No history of abdominal surgery.  Denies nausea, vomiting, diarrhea, melena or hematochezia.    Health Maintenance: Dental -- last checkup in October 2013. Vision -- last checkup in October 2013. Immunizations -- tetanus up-to-date (Due in 2018).  Declines office flu shot today because he just got flu shot at work this morning.  Past Medical History  Diagnosis Date  . Hypertension   . GERD (gastroesophageal reflux disease)   . Chicken pox     No current outpatient prescriptions on file prior to visit.   No current facility-administered medications on file prior to visit.    No Known Allergies  Family History  Problem Relation Age of Onset  . Hypertension Father   . Depression Father   . Hypertension Mother   . Kidney disease Maternal Grandmother     Dialysis  . Hypertension Maternal Grandmother   . Healthy Brother     x1  . Healthy Sister     x3    History   Social History  . Marital Status: Divorced    Spouse  Name: N/A    Number of Children: N/A  . Years of Education: N/A   Social History Main Topics  . Smoking status: Never Smoker   . Smokeless tobacco: Never Used  . Alcohol Use: 1.2 oz/week    2 Cans of beer per week  . Drug Use: No  . Sexual Activity: Yes    Birth Control/ Protection: Condom   Other Topics Concern  . None   Social History Narrative  . None   Review of Systems  Constitutional: Negative for fever, chills and weight loss.  HENT: Negative for hearing loss, ear pain, nosebleeds, tinnitus and ear discharge.   Eyes: Negative for blurred vision, double vision, photophobia and pain.  Respiratory: Negative for cough, shortness of breath and wheezing.   Cardiovascular: Negative for chest pain and palpitations.  Gastrointestinal: Positive for heartburn. Negative for nausea, vomiting, abdominal pain, diarrhea, constipation, blood in stool and melena.  Genitourinary: Negative for dysuria, urgency, frequency, hematuria and flank pain.  Musculoskeletal: Negative for myalgias.  Neurological: Negative for dizziness, seizures, loss of consciousness and headaches.  Endo/Heme/Allergies: Negative for environmental allergies. Does not bruise/bleed easily.  Psychiatric/Behavioral: Negative for depression, suicidal ideas and substance abuse. The patient is not nervous/anxious and does not have insomnia.    Filed Vitals:   09/06/13 0837  BP: 146/88  Pulse: 87  Temp: 97.9 F (36.6 C)  Resp: 18   Physical Exam  Vitals reviewed. Constitutional: He is oriented to person, place, and time  and well-developed, well-nourished, and in no distress.  HENT:  Head: Normocephalic and atraumatic.  Right Ear: External ear normal.  Left Ear: External ear normal.  Nose: Nose normal.  Mouth/Throat: Oropharynx is clear and moist. No oropharyngeal exudate.  TM WNL bilaterally  Eyes: Conjunctivae and EOM are normal. Pupils are equal, round, and reactive to light.  Neck: Neck supple. No thyromegaly  present.  Cardiovascular: Normal rate, regular rhythm, normal heart sounds and intact distal pulses.   Pulmonary/Chest: Effort normal and breath sounds normal. No respiratory distress. He has no wheezes. He has no rales.  Abdominal: Soft. Bowel sounds are normal. He exhibits no distension and no mass. There is no tenderness. There is no rebound and no guarding.  Lymphadenopathy:    He has no cervical adenopathy.  Neurological: He is alert and oriented to person, place, and time. No cranial nerve deficit.  Skin: Skin is warm and dry. No rash noted.  Psychiatric: Affect normal.   Assessment/Plan: HYPERTENSION Continue current regimen, remembering to take medications as prescribed every day.  DASH diet and Exercise-to-lose-weight information given to patient.  Follow-up in 1 month to evaluate lifestyle changes.  OBESITY Patient ready to try diet and exercise to get a handle on weight and control of other medical conditions.  Information given.  Will follow-up in 1 month.  Encounter for preventive health examination Patient to return for fasting labs.  Will discuss results with patient and treat accordingly.  Patient up-to-date on immunizations.  Had flu shot this am at work.  GERD (gastroesophageal reflux disease) Continue omeprazole as prescribed.  Take 2 tums for breakthrough symptoms.  Will obtain H. Pylori blood test due to abdominal pain with reflux.  Patient educated on trigger foods.  Again, encouraged dietary and lifestyle changes

## 2013-09-06 NOTE — Assessment & Plan Note (Signed)
Continue omeprazole as prescribed.  Take 2 tums for breakthrough symptoms.  Will obtain H. Pylori blood test due to abdominal pain with reflux.  Patient educated on trigger foods.  Again, encouraged dietary and lifestyle changes

## 2013-09-06 NOTE — Assessment & Plan Note (Signed)
Patient ready to try diet and exercise to get a handle on weight and control of other medical conditions.  Information given.  Will follow-up in 1 month.

## 2013-09-06 NOTE — Assessment & Plan Note (Signed)
Patient to return for fasting labs.  Will discuss results with patient and treat accordingly.  Patient up-to-date on immunizations.  Had flu shot this am at work.

## 2013-09-06 NOTE — Patient Instructions (Signed)
Please continue medications as prescribed.  Take 2 tums for breakthrough acid reflux symptoms.  Please return for lab work.  Read information below on weight loss and healthy eating, including the DASH diet.  Please make follow-up appointment for 1 month with front desk staff.  Please call if you need anything.  DASH Diet The DASH diet stands for "Dietary Approaches to Stop Hypertension." It is a healthy eating plan that has been shown to reduce high blood pressure (hypertension) in as little as 14 days, while also possibly providing other significant health benefits. These other health benefits include reducing the risk of breast cancer after menopause and reducing the risk of type 2 diabetes, heart disease, colon cancer, and stroke. Health benefits also include weight loss and slowing kidney failure in patients with chronic kidney disease.  DIET GUIDELINES  Limit salt (sodium). Your diet should contain less than 1500 mg of sodium daily.  Limit refined or processed carbohydrates. Your diet should include mostly whole grains. Desserts and added sugars should be used sparingly.  Include small amounts of heart-healthy fats. These types of fats include nuts, oils, and tub margarine. Limit saturated and trans fats. These fats have been shown to be harmful in the body. CHOOSING FOODS  The following food groups are based on a 2000 calorie diet. See your Registered Dietitian for individual calorie needs. Grains and Grain Products (6 to 8 servings daily)  Eat More Often: Whole-wheat bread, brown rice, whole-grain or wheat pasta, quinoa, popcorn without added fat or salt (air popped).  Eat Less Often: White bread, white pasta, white rice, cornbread. Vegetables (4 to 5 servings daily)  Eat More Often: Fresh, frozen, and canned vegetables. Vegetables may be raw, steamed, roasted, or grilled with a minimal amount of fat.  Eat Less Often/Avoid: Creamed or fried vegetables. Vegetables in a cheese  sauce. Fruit (4 to 5 servings daily)  Eat More Often: All fresh, canned (in natural juice), or frozen fruits. Dried fruits without added sugar. One hundred percent fruit juice ( cup [237 mL] daily).  Eat Less Often: Dried fruits with added sugar. Canned fruit in light or heavy syrup. [employer], Fish, and Poultry (2 servings or less daily. One serving is 3 to 4 oz [85-114 g]).  Eat More Often: Ninety percent or leaner ground beef, tenderloin, sirloin. Round cuts of beef, chicken breast, [place] breast. All fish. Grill, bake, or broil your meat. Nothing should be fried.  Eat Less Often/Avoid: Fatty cuts of meat, [place], or chicken leg, thigh, or wing. Fried cuts of meat or fish. Dairy (2 to 3 servings)  Eat More Often: Low-fat or fat-free milk, low-fat plain or light yogurt, reduced-fat or part-skim cheese.  Eat Less Often/Avoid: Milk (whole, 2%).Whole milk yogurt. Full-fat cheeses. Nuts, Seeds, and Legumes (4 to 5 servings per week)  Eat More Often: All without added salt.  Eat Less Often/Avoid: Salted nuts and seeds, canned beans with added salt. Fats and Sweets (limited)  Eat More Often: Vegetable oils, tub margarines without trans fats, sugar-free gelatin. Mayonnaise and salad dressings.  Eat Less Often/Avoid: Coconut oils, palm oils, butter, stick margarine, cream, half and half, cookies, candy, pie. FOR MORE INFORMATION The Dash Diet Eating Plan: www.dashdiet.org Document Released: 11/10/2011 Document Revised: 02/13/2012 Document Reviewed: 11/10/2011 [facility] Patient Information 2014 [place], [place].   Exercise to Lose Weight Exercise and a healthy diet may help you lose weight. Your doctor may suggest specific exercises. EXERCISE IDEAS AND TIPS  Choose low-cost things you enjoy doing, such  as walking, bicycling, or exercising to workout videos.  Take stairs instead of the elevator.  Walk during your lunch break.  Park your car further away from work or school.  Go  to a gym or an exercise class.  Start with 5 to 10 minutes of exercise each day. Build up to 30 minutes of exercise 4 to 6 days a week.  Wear shoes with good support and comfortable clothes.  Stretch before and after working out.  Work out until you breathe harder and your heart beats faster.  Drink extra water when you exercise.  Do not do so much that you hurt yourself, feel dizzy, or get very short of breath. Exercises that burn about 150 calories:  Running 1  miles in 15 minutes.  Playing volleyball for 45 to 60 minutes.  Washing and waxing a car for 45 to 60 minutes.  Playing touch football for 45 minutes.  Walking 1  miles in 35 minutes.  Pushing a stroller 1  miles in 30 minutes.  Playing basketball for 30 minutes.  Raking leaves for 30 minutes.  Bicycling 5 miles in 30 minutes.  Walking 2 miles in 30 minutes.  Dancing for 30 minutes.  Shoveling snow for 15 minutes.  Swimming laps for 20 minutes.  Walking up stairs for 15 minutes.  Bicycling 4 miles in 15 minutes.  Gardening for 30 to 45 minutes.  Jumping rope for 15 minutes.  Washing windows or floors for 45 to 60 minutes. Document Released: 12/24/2010 Document Revised: 02/13/2012 Document Reviewed: 12/24/2010 [facility] Patient Information 2014 [place], [place].

## 2013-09-06 NOTE — Assessment & Plan Note (Signed)
Continue current regimen, remembering to take medications as prescribed every day.  DASH diet and Exercise-to-lose-weight information given to patient.  Follow-up in 1 month to evaluate lifestyle changes.

## 2013-09-27 ENCOUNTER — Telehealth: Payer: Self-pay | Admitting: Physician Assistant

## 2013-09-27 NOTE — Telephone Encounter (Signed)
Patient instructed to check BP and if WNL, it is Ok to wait until tomorrow morning, but if after eating dinner [high sodium meal/japanese] he is to check BP and if elevated, take medications; then wait until midday tomorrow to take again, then go back to regular schedule on Sunday morning. Patient understood & agreed/SLS

## 2013-09-27 NOTE — Telephone Encounter (Signed)
Patient states that he cannot remember if he took any of his blood pressure medications this morning. He isn't sure if he should take them this afternoon or just wait to take them in the morning.

## 2013-10-01 ENCOUNTER — Telehealth: Payer: Self-pay | Admitting: *Deleted

## 2013-10-01 DIAGNOSIS — I10 Essential (primary) hypertension: Secondary | ICD-10-CM

## 2013-10-01 MED ORDER — HYDROCHLOROTHIAZIDE 25 MG PO TABS: 25.0000 mg | ORAL_TABLET | Freq: Every day | ORAL | Status: DC

## 2013-10-01 MED ORDER — LISINOPRIL 20 MG PO TABS: 20.0000 mg | ORAL_TABLET | Freq: Every day | ORAL | Status: DC

## 2013-10-01 MED ORDER — AMLODIPINE BESYLATE 10 MG PO TABS: 10.0000 mg | ORAL_TABLET | Freq: Every day | ORAL | Status: DC

## 2013-10-01 NOTE — Telephone Encounter (Signed)
Pt left message that his insurance is requiring a 90 day supply of BP medications and he was unable to fill previous rxs for 30 day supply. 90 days supply sent for lisinopril, amlodipine and hctz. Pt aware.

## 2013-10-07 ENCOUNTER — Ambulatory Visit (INDEPENDENT_AMBULATORY_CARE_PROVIDER_SITE_OTHER): Payer: Managed Care, Other (non HMO) | Admitting: Physician Assistant

## 2013-10-07 ENCOUNTER — Other Ambulatory Visit: Payer: Self-pay | Admitting: Physician Assistant

## 2013-10-07 ENCOUNTER — Encounter: Payer: Self-pay | Admitting: Physician Assistant

## 2013-10-07 VITALS — BP 132/82 | HR 85 | Temp 98.0°F | Resp 18 | Ht 70.0 in | Wt 304.5 lb

## 2013-10-07 DIAGNOSIS — I10 Essential (primary) hypertension: Secondary | ICD-10-CM

## 2013-10-07 DIAGNOSIS — R1013 Epigastric pain: Secondary | ICD-10-CM

## 2013-10-07 DIAGNOSIS — K219 Gastro-esophageal reflux disease without esophagitis: Secondary | ICD-10-CM

## 2013-10-07 LAB — LIPID PANEL
Cholesterol: 168 mg/dL (ref 0–200)
HDL: 38 mg/dL — ABNORMAL LOW (ref >39)
LDL Cholesterol: 116 mg/dL — ABNORMAL HIGH (ref 0–99)
Triglycerides: 72 mg/dL (ref <150)
VLDL: 14 mg/dL (ref 0–40)

## 2013-10-07 LAB — HEPATIC FUNCTION PANEL
Bilirubin, Direct: 0.1 mg/dL (ref 0.0–0.3)
Indirect Bilirubin: 0.4 mg/dL (ref 0.0–0.9)
Total Protein: 7.6 g/dL (ref 6.0–8.3)

## 2013-10-07 LAB — BASIC METABOLIC PANEL
BUN: 14 mg/dL (ref 6–23)
Calcium: 9.8 mg/dL (ref 8.4–10.5)
Creat: 1.12 mg/dL (ref 0.50–1.35)

## 2013-10-07 LAB — CBC WITH DIFFERENTIAL/PLATELET
Basophils Absolute: 0 10*3/uL (ref 0.0–0.1)
Eosinophils Absolute: 0.1 10*3/uL (ref 0.0–0.7)
Eosinophils Relative: 2 % (ref 0–5)
MCH: 27 pg (ref 26.0–34.0)
MCHC: 34 g/dL (ref 30.0–36.0)
MCV: 79.4 fL (ref 78.0–100.0)
Monocytes Absolute: 0.5 10*3/uL (ref 0.1–1.0)
Platelets: 379 10*3/uL (ref 150–400)
RDW: 15.3 % (ref 11.5–15.5)
WBC: 6.4 10*3/uL (ref 4.0–10.5)

## 2013-10-07 NOTE — Patient Instructions (Signed)
Please continue diet and exercise for weight loss.  I am very impressed with your lifestyle changes so far.  Try to eat a small meal every 3 hours throughout the day (lean protein and fruit/vegetable).  This will keep your blood glucose (sugar) stable and help increase your metabolism, which will help you shed pounds.  Please obtain labs.  I will call you with your results.  We will send you to see a Gastroenterologist Theatre stage manager) for further evaluation and treatment if your labs look good.  For now, continue with the Omeprazole 20 mg twice daily.  Can also take two Tums right after a meal to help with breakthrough symptoms.  For your "bloating" symptoms, begin taking a daily probiotic (Align, One A Day, Culturelle, Digestive Advantage).

## 2013-10-07 NOTE — Progress Notes (Signed)
Patient ID: Robert Cruz, male   DOB: [birth year], 37 y.o.   MRN: [id number]  Patient presents to clinic today for 31-month follow-up of HTN.  Patient BP improved since last visit.  States he has not taken his BP medication this morning.  Patient has been exercising some and has started a diet high in lean protein, vegetables, and low in salt and fried, fatty foods.  Has lost 3 pounds since last visit.  Denies chest pain, palpitations, lightheadedness, dizziness, syncope.  Does endorse very infrequent headaches.    Patient also having GERD symptoms and epigastric discomfort, despite taking 40 mg Omeprazole daily.  Has never been evaluated by GI.  Patient was instructed to go to lab at last visit to check H. Pylori, BMP, Hepatic Function Panel, but did not go to lab. Patient denies N/V/C/D, shortness of breath, or chronic cough.  Does endorse heartburn, epigastric discomfort, globus.  Denies NSAID use or increase in alcohol intake. Denies hx of PUD. EKG obtained showing NSR at rate of 63.   Past Medical History  Diagnosis Date  . Hypertension   . GERD (gastroesophageal reflux disease)   . Chicken pox     Current Outpatient Prescriptions on File Prior to Visit  Medication Sig Dispense Refill  . amLODipine (NORVASC) 10 MG tablet Take 1 tablet (10 mg total) by mouth daily.  90 tablet  0  . hydrochlorothiazide (HYDRODIURIL) 25 MG tablet Take 1 tablet (25 mg total) by mouth daily.  90 tablet  0  . lisinopril (PRINIVIL,ZESTRIL) 20 MG tablet Take 1 tablet (20 mg total) by mouth daily.  90 tablet  0  . omeprazole (PRILOSEC) 20 MG capsule Take 1 capsule (20 mg total) by mouth 2 (two) times daily.  60 capsule  2   No current facility-administered medications on file prior to visit.    No Known Allergies  Family History  Problem Relation Age of Onset  . Hypertension Father   . Depression Father   . Hypertension Mother   . Kidney disease Maternal Grandmother     Dialysis  . Hypertension Maternal  Grandmother   . Healthy Brother     x1  . Healthy Sister     x3    History   Social History  . Marital Status: Divorced    Spouse Name: N/A    Number of Children: N/A  . Years of Education: N/A   Social History Main Topics  . Smoking status: Never Smoker   . Smokeless tobacco: Never Used  . Alcohol Use: 1.2 oz/week    2 Cans of beer per week  . Drug Use: No  . Sexual Activity: Yes    Birth Control/ Protection: Condom   Other Topics Concern  . None   Social History Narrative  . None   ROS See HPI.  All other ROS are negative.   Filed Vitals:   10/07/13 0731  BP: 132/82  Pulse: 85  Temp: 98 F (36.7 C)  Resp: 18    Physical Exam  Vitals reviewed. Constitutional: He is oriented to person, place, and time.  Obese, well-developed in no acute distress, sitting comfortably on examination table.  HENT:  Head: Normocephalic and atraumatic.  Eyes: Conjunctivae are normal.  Neck: Neck supple.  Cardiovascular: Normal rate, regular rhythm, normal heart sounds and intact distal pulses.   Pulmonary/Chest: Effort normal and breath sounds normal. No respiratory distress. He has no wheezes. He has no rales. He exhibits no tenderness.  Abdominal: Soft. Bowel  sounds are normal. He exhibits no distension and no mass. There is no tenderness. There is no rebound and no guarding.  Lymphadenopathy:    He has no cervical adenopathy.  Neurological: He is alert and oriented to person, place, and time.  Skin: Skin is warm and dry. No rash noted.  Psychiatric: Affect normal.   Assessment/Plan: HYPERTENSION Improved at today's visit. EKG showing NSR.  Continue current medication regimen.  Continue DASH diet and exercise.  Attempt multiple small meals throughout the day to increase metabolism and stabilize blood glucose levels to aid in weight loss.  Follow-up will be determined based o fasting lab results.  GERD (gastroesophageal reflux disease) Will obtain CBC, BMP, Hepatic  Function, Lipase and H. Pylori, given patient's endorsement of reflux symptoms associated with epigastric pain.  Continue omeprazole 40 mg daily.  Tums for breakthrough symptoms.  Referral to GI pending normal labs for further assessment and management.

## 2013-10-07 NOTE — Assessment & Plan Note (Signed)
Will obtain CBC, BMP, Hepatic Function, Lipase and H. Pylori, given patient's endorsement of reflux symptoms associated with epigastric pain.  Continue omeprazole 40 mg daily.  Tums for breakthrough symptoms.  Referral to GI pending normal labs for further assessment and management.

## 2013-10-07 NOTE — Assessment & Plan Note (Signed)
Improved at today's visit. EKG showing NSR.  Continue current medication regimen.  Continue DASH diet and exercise.  Attempt multiple small meals throughout the day to increase metabolism and stabilize blood glucose levels to aid in weight loss.  Follow-up will be determined based o fasting lab results.

## 2013-10-08 ENCOUNTER — Telehealth: Payer: Self-pay | Admitting: Physician Assistant

## 2013-10-08 DIAGNOSIS — K219 Gastro-esophageal reflux disease without esophagitis: Secondary | ICD-10-CM

## 2013-10-08 LAB — URINALYSIS, ROUTINE W REFLEX MICROSCOPIC
Bilirubin Urine: NEGATIVE
Ketones, ur: NEGATIVE mg/dL
Leukocytes, UA: NEGATIVE
Nitrite: NEGATIVE
Protein, ur: 30 mg/dL — AB
Specific Gravity, Urine: 1.028 (ref 1.005–1.030)
Urobilinogen, UA: 0.2 mg/dL (ref 0.0–1.0)
pH: 5.5 (ref 5.0–8.0)

## 2013-10-08 LAB — H. PYLORI ANTIBODY, IGG: H Pylori IgG: 0.5 {ISR}

## 2013-10-08 LAB — URINALYSIS, MICROSCOPIC ONLY
Casts: NONE SEEN
Crystals: NONE SEEN
Squamous Epithelial / LPF: NONE SEEN

## 2013-10-08 NOTE — Telephone Encounter (Signed)
Please inform patient that overall his labs look good.  His A1C came back at 6.1, placing him at increased risk for developing diabetes.  He should continue his diet and exercise regimen that we discussed at his visit.  His H. Pylori came back negative.  I am sending him to see GI for further evaluation of  His refractory acid reflux symptoms, despite medical treatment.

## 2013-10-08 NOTE — Telephone Encounter (Signed)
Patient called in requesting results. °

## 2013-10-09 ENCOUNTER — Encounter: Payer: Self-pay | Admitting: Gastroenterology

## 2013-10-09 NOTE — Telephone Encounter (Signed)
LMOM with contact name and number for return call RE: results and further provider instructions/SLS  

## 2013-10-11 NOTE — Telephone Encounter (Signed)
LMOM [2nd] with contact name and number for return call RE: results and further provider instructions, informed may receive call for GI consult prior to speaking w/him/SLS

## 2013-10-18 ENCOUNTER — Ambulatory Visit (INDEPENDENT_AMBULATORY_CARE_PROVIDER_SITE_OTHER): Payer: Managed Care, Other (non HMO) | Admitting: Gastroenterology

## 2013-10-18 ENCOUNTER — Encounter: Payer: Self-pay | Admitting: Gastroenterology

## 2013-10-18 VITALS — BP 162/92 | HR 72 | Ht 71.65 in | Wt 309.5 lb

## 2013-10-18 DIAGNOSIS — R1310 Dysphagia, unspecified: Secondary | ICD-10-CM

## 2013-10-18 DIAGNOSIS — K219 Gastro-esophageal reflux disease without esophagitis: Secondary | ICD-10-CM

## 2013-10-18 MED ORDER — RABEPRAZOLE SODIUM 20 MG PO TBEC: 20.0000 mg | DELAYED_RELEASE_TABLET | Freq: Every day | ORAL | Status: DC

## 2013-10-18 NOTE — Telephone Encounter (Signed)
Patient informed, understood & agreed; pt had GI appt today & states went well/SLS

## 2013-10-18 NOTE — Assessment & Plan Note (Addendum)
The patient is symptomatic despite daily omeprazole  Recommendations #1 trial of Nexium 40 mg daily

## 2013-10-18 NOTE — Assessment & Plan Note (Signed)
Dysphagia is probably due to 2 an early esophageal stricture  Recommendations #1 EGD with dilation as indicated

## 2013-10-18 NOTE — Patient Instructions (Signed)
You have been scheduled for an endoscopy with propofol. Please follow written instructions given to you at your visit today. If you use inhalers (even only as needed), please bring them with you on the day of your procedure. Your physician has requested that you go to www.startemmi.com and enter the access code given to you at your visit today. This web site gives a general overview about your procedure. However, you should still follow specific instructions given to you by our office regarding your preparation for the procedure.  Gastroesophageal Reflux Disease, Adult Gastroesophageal reflux disease (GERD) happens when acid from your stomach flows up into the esophagus. When acid comes in contact with the esophagus, the acid causes soreness (inflammation) in the esophagus. Over time, GERD may create small holes (ulcers) in the lining of the esophagus. CAUSES   Increased body weight. This puts pressure on the stomach, making acid rise from the stomach into the esophagus.  Smoking. This increases acid production in the stomach.  Drinking alcohol. This causes decreased pressure in the lower esophageal sphincter (valve or ring of muscle between the esophagus and stomach), allowing acid from the stomach into the esophagus.  Late evening meals and a full stomach. This increases pressure and acid production in the stomach.  A malformed lower esophageal sphincter. Sometimes, no cause is found. SYMPTOMS   Burning pain in the lower part of the mid-chest behind the breastbone and in the mid-stomach area. This may occur twice a week or more often.  Trouble swallowing.  Sore throat.  Dry cough.  Asthma-like symptoms including chest tightness, shortness of breath, or wheezing. DIAGNOSIS  Your caregiver may be able to diagnose GERD based on your symptoms. In some cases, X-rays and other tests may be done to check for complications or to check the condition of your stomach and esophagus. TREATMENT    Your caregiver may recommend over-the-counter or prescription medicines to help decrease acid production. Ask your caregiver before starting or adding any new medicines.  HOME CARE INSTRUCTIONS   Change the factors that you can control. Ask your caregiver for guidance concerning weight loss, quitting smoking, and alcohol consumption.  Avoid foods and drinks that make your symptoms worse, such as:  Caffeine or alcoholic drinks.  Chocolate.  Peppermint or mint flavorings.  Garlic and onions.  Spicy foods.  Citrus fruits, such as oranges, lemons, or limes.  Tomato-based foods such as sauce, chili, salsa, and pizza.  Fried and fatty foods.  Avoid lying down for the 3 hours prior to your bedtime or prior to taking a nap.  Eat small, frequent meals instead of large meals.  Wear loose-fitting clothing. Do not wear anything tight around your waist that causes pressure on your stomach.  Raise the head of your bed 6 to 8 inches with wood blocks to help you sleep. Extra pillows will not help.  Only take over-the-counter or prescription medicines for pain, discomfort, or fever as directed by your caregiver.  Do not take aspirin, ibuprofen, or other nonsteroidal anti-inflammatory drugs (NSAIDs). SEEK IMMEDIATE MEDICAL CARE IF:   You have pain in your arms, neck, jaw, teeth, or back.  Your pain increases or changes in intensity or duration.  You develop nausea, vomiting, or sweating (diaphoresis).  You develop shortness of breath, or you faint.  Your vomit is green, yellow, black, or looks like coffee grounds or blood.  Your stool is red, bloody, or black. These symptoms could be signs of other problems, such as heart disease, gastric bleeding,  or esophageal bleeding. MAKE SURE YOU:   Understand these instructions.  Will watch your condition.  Will get help right away if you are not doing well or get worse. Document Released: 08/31/2005 Document Revised: 02/13/2012 Document  Reviewed: 06/10/2011 [facility] Patient Information 2014 [place], [place].

## 2013-10-18 NOTE — Progress Notes (Signed)
History of Present Illness: This 37 year old Afro-American male referred for evaluation of reflux.  Despite taking daily Prilosec he is having fairly regular pyrosis.  Immediately postprandially he has a globus sensation.  In addition, he he is having dysphagia to solids.  He is on no regular gastric irritants including nonsteroidals.    Past Medical History  Diagnosis Date  . Hypertension   . GERD (gastroesophageal reflux disease)   . Chicken pox    Past Surgical History  Procedure Laterality Date  . Unremarkable.     family history includes Depression in his father; Healthy in his brother and sister; Hypertension in his father, maternal grandmother, and mother; Kidney disease in his maternal grandmother. Current Outpatient Prescriptions  Medication Sig Dispense Refill  . amLODipine (NORVASC) 10 MG tablet Take 1 tablet (10 mg total) by mouth daily.  90 tablet  0  . Ascorbic Acid (VITAMIN C) 100 MG tablet Take 100 mg by mouth daily.      . hydrochlorothiazide (HYDRODIURIL) 25 MG tablet Take 1 tablet (25 mg total) by mouth daily.  90 tablet  0  . lisinopril (PRINIVIL,ZESTRIL) 20 MG tablet Take 1 tablet (20 mg total) by mouth daily.  90 tablet  0  . Multiple Vitamin (MULTIVITAMIN) tablet Take 1 tablet by mouth daily.      Marland Kitchen omeprazole (PRILOSEC) 20 MG capsule Take 1 capsule (20 mg total) by mouth 2 (two) times daily.  60 capsule  2   No current facility-administered medications for this visit.   Allergies as of 10/18/2013  . (No Known Allergies)    reports that he has never smoked. He has never used smokeless tobacco. He reports that he drinks about 1.2 ounces of alcohol per week. He reports that he does not use illicit drugs.     Review of Systems: Pertinent positive and negative review of systems were noted in the above HPI section. All other review of systems were otherwise negative.  Vital signs were reviewed in today's medical record Physical Exam: General: Well developed ,  well nourished, no acute distress Skin: anicteric Head: Normocephalic and atraumatic Eyes:  sclerae anicteric, EOMI Ears: Normal auditory acuity Mouth: No deformity or lesions Neck: Supple, no masses or thyromegaly Lungs: Clear throughout to auscultation Heart: Regular rate and rhythm; no murmurs, rubs or bruits Abdomen: Soft, non tender and non distended. No masses, hepatosplenomegaly or hernias noted. Normal Bowel sounds Rectal:deferred Musculoskeletal: Symmetrical with no gross deformities  Skin: No lesions on visible extremities Pulses:  Normal pulses noted Extremities: No clubbing, cyanosis, edema or deformities noted Neurological: Alert oriented x 4, grossly nonfocal Cervical Nodes:  No significant cervical adenopathy Inguinal Nodes: No significant inguinal adenopathy Psychological:  Alert and cooperative. Normal mood and affect

## 2013-10-23 ENCOUNTER — Other Ambulatory Visit: Payer: Self-pay | Admitting: Physician Assistant

## 2013-10-24 ENCOUNTER — Encounter: Payer: Self-pay | Admitting: Gastroenterology

## 2013-10-28 ENCOUNTER — Telehealth: Payer: Self-pay | Admitting: Gastroenterology

## 2013-10-28 NOTE — Telephone Encounter (Signed)
Left message for pt to call back  °

## 2013-10-29 NOTE — Telephone Encounter (Signed)
Left message for pt to call back.  Pt wanted to know if he could take Nexium along with aciphex for breakthrough heartburn. Discussed with pt that he should just try OTC meds like tums, rolaids, or mylanta for breakthrough. Pt verbalized understanding.

## 2013-11-09 ENCOUNTER — Emergency Department (HOSPITAL_BASED_OUTPATIENT_CLINIC_OR_DEPARTMENT_OTHER)
Admission: EM | Admit: 2013-11-09 | Discharge: 2013-11-09 | Disposition: A | Discharge status: Home / Self Care | Payer: Managed Care, Other (non HMO) | Attending: Emergency Medicine | Admitting: Emergency Medicine

## 2013-11-09 ENCOUNTER — Encounter (HOSPITAL_BASED_OUTPATIENT_CLINIC_OR_DEPARTMENT_OTHER): Payer: Self-pay | Admitting: Emergency Medicine

## 2013-11-09 DIAGNOSIS — I10 Essential (primary) hypertension: Secondary | ICD-10-CM | POA: Insufficient documentation

## 2013-11-09 DIAGNOSIS — R630 Anorexia: Secondary | ICD-10-CM | POA: Insufficient documentation

## 2013-11-09 DIAGNOSIS — K219 Gastro-esophageal reflux disease without esophagitis: Secondary | ICD-10-CM | POA: Insufficient documentation

## 2013-11-09 DIAGNOSIS — J029 Acute pharyngitis, unspecified: Secondary | ICD-10-CM

## 2013-11-09 DIAGNOSIS — Z79899 Other long term (current) drug therapy: Secondary | ICD-10-CM | POA: Insufficient documentation

## 2013-11-09 DIAGNOSIS — Z8619 Personal history of other infectious and parasitic diseases: Secondary | ICD-10-CM | POA: Insufficient documentation

## 2013-11-09 LAB — RAPID STREP SCREEN (MED CTR MEBANE ONLY): Streptococcus, Group A Screen (Direct): NEGATIVE

## 2013-11-09 MED ORDER — HYDROCODONE-ACETAMINOPHEN 7.5-325 MG/15ML PO SOLN: 15.0000 mL | Freq: Four times a day (QID) | ORAL | Status: AC | PRN

## 2013-11-09 NOTE — ED Notes (Signed)
Pt has sore throat since yesterday.  No known fever.  Pt states a co-worker was diagnosed with strep throat.

## 2013-11-09 NOTE — ED Provider Notes (Signed)
CSN: [id number]     Arrival date & time 11/09/13  8295 History   First MD Initiated Contact with Patient 11/09/13 [phone]     Chief Complaint  Patient presents with  . Sore Throat   (Consider location/radiation/quality/duration/timing/severity/associated sxs/prior Treatment) HPI Comments: Pt complains of sore throat since last night.  No fevers.  No nasal drainage, cough.  No n/v/d.  No rashes.  Had a co-worker that was diagnosed with strept throat 2 weeks ago.  No SOB or difficulty swallowing other than pain.  Patient is a 37 y.o. male presenting with pharyngitis.  Sore Throat Pertinent negatives include no chest pain, no abdominal pain, no headaches and no shortness of breath.    Past Medical History  Diagnosis Date  . Hypertension   . GERD (gastroesophageal reflux disease)   . Chicken pox    Past Surgical History  Procedure Laterality Date  . Unremarkable.     Family History  Problem Relation Age of Onset  . Hypertension Father   . Depression Father   . Hypertension Mother   . Kidney disease Maternal Grandmother     Dialysis  . Hypertension Maternal Grandmother   . Healthy Brother     x1  . Healthy Sister     x3   History  Substance Use Topics  . Smoking status: Never Smoker   . Smokeless tobacco: Never Used  . Alcohol Use: 1.2 oz/week    2 Cans of beer per week    Review of Systems  Constitutional: Positive for appetite change. Negative for fever, chills, diaphoresis and fatigue.  HENT: Positive for sore throat. Negative for congestion, rhinorrhea and sneezing.   Eyes: Negative.   Respiratory: Negative for cough, chest tightness and shortness of breath.   Cardiovascular: Negative for chest pain and leg swelling.  Gastrointestinal: Negative for nausea, vomiting, abdominal pain, diarrhea and blood in stool.  Genitourinary: Negative for frequency, hematuria, flank pain and difficulty urinating.  Musculoskeletal: Negative for arthralgias and back pain.  Skin:  Negative for rash.  Neurological: Negative for dizziness, speech difficulty, weakness, numbness and headaches.    Allergies  Review of patient's allergies indicates no known allergies.  Home Medications   Current Outpatient Rx  Name  Route  Sig  Dispense  Refill  . amLODipine (NORVASC) 10 MG tablet   Oral   Take 1 tablet (10 mg total) by mouth daily.   90 tablet   0   . Ascorbic Acid (VITAMIN C) 100 MG tablet   Oral   Take 100 mg by mouth daily.         . hydrochlorothiazide (HYDRODIURIL) 25 MG tablet   Oral   Take 1 tablet (25 mg total) by mouth daily.   90 tablet   0   . HYDROcodone-acetaminophen (HYCET) 7.5-325 mg/15 ml solution   Oral   Take 15 mLs by mouth 4 (four) times daily as needed for moderate pain.   120 mL   0   . lisinopril (PRINIVIL,ZESTRIL) 20 MG tablet   Oral   Take 1 tablet (20 mg total) by mouth daily.   90 tablet   0   . Multiple Vitamin (MULTIVITAMIN) tablet   Oral   Take 1 tablet by mouth daily.         . RABEprazole (ACIPHEX) 20 MG tablet   Oral   Take 1 tablet (20 mg total) by mouth daily.   30 tablet   3    BP 155/92  Pulse 71  Temp(Src) 98.6 F (37 C) (Oral)  Resp 16  Ht 6\' 1"  (1.854 m)  Wt 280 lb (127.007 kg)  BMI 36.95 kg/m2  SpO2 98% Physical Exam  Constitutional: He is oriented to person, place, and time. He appears well-developed and well-nourished.  HENT:  Head: Normocephalic and atraumatic.  Mouth/Throat: Oropharynx is clear and moist.  Mild erythema to posterior pharynx.  No exudates.  Uvula midline.  Eyes: Pupils are equal, round, and reactive to light.  Neck: Normal range of motion. Neck supple.  Cardiovascular: Normal rate, regular rhythm and normal heart sounds.   Pulmonary/Chest: Effort normal and breath sounds normal. No respiratory distress. He has no wheezes. He has no rales. He exhibits no tenderness.  Abdominal: Soft. Bowel sounds are normal. There is no tenderness. There is no rebound and no  guarding.  Musculoskeletal: Normal range of motion. He exhibits no edema.  Lymphadenopathy:    He has no cervical adenopathy.  Neurological: He is alert and oriented to person, place, and time.  Skin: Skin is warm and dry. No rash noted.  Psychiatric: He has a normal mood and affect.    ED Course  Procedures (including critical care time) Labs Review Labs Reviewed  RAPID STREP SCREEN  CULTURE, GROUP A STREP   Imaging Review No results found.  EKG Interpretation   None       MDM   1. Pharyngitis    Patient presents with sore throat since last night. No fever. He is well-appearing with no significant swelling of the throat noted. No trismus. No suggestions of a peritonsillar abscess. He was discharged in good condition. Strep test was negative a throat culture was sent. He was given a prescription for Lortab elixir for symptomatic care.    Rolan Bucco, MD 11/09/13 [phone]

## 2013-11-12 LAB — CULTURE, GROUP A STREP

## 2013-12-10 ENCOUNTER — Telehealth: Payer: Self-pay | Admitting: Gastroenterology

## 2013-12-10 NOTE — Telephone Encounter (Signed)
Pt wanted to know if he is supposed to take his medications the morning of his procedure. Discussed with pt that he is to take meds as early that morning as possible with a small sip of water. Pt verbalized understanding.

## 2013-12-12 ENCOUNTER — Encounter: Payer: Self-pay | Admitting: Gastroenterology

## 2013-12-12 ENCOUNTER — Ambulatory Visit (AMBULATORY_SURGERY_CENTER): Payer: Managed Care, Other (non HMO) | Admitting: Gastroenterology

## 2013-12-12 VITALS — BP 122/68 | HR 72 | Temp 97.3°F | Resp 12 | Ht 71.65 in | Wt 309.0 lb

## 2013-12-12 DIAGNOSIS — K219 Gastro-esophageal reflux disease without esophagitis: Secondary | ICD-10-CM

## 2013-12-12 DIAGNOSIS — K222 Esophageal obstruction: Secondary | ICD-10-CM

## 2013-12-12 DIAGNOSIS — R1310 Dysphagia, unspecified: Secondary | ICD-10-CM

## 2013-12-12 MED ORDER — SODIUM CHLORIDE 0.9 % IV SOLN: 500.0000 mL | INTRAVENOUS | Status: DC

## 2013-12-12 NOTE — Progress Notes (Signed)
Called to room to assist during endoscopic procedure.  Patient ID and intended procedure confirmed with present staff. Received instructions for my participation in the procedure from the performing physician.  

## 2013-12-12 NOTE — Op Note (Signed)
[facility] [address] [place], [id number]   ENDOSCOPY PROCEDURE REPORT  PATIENT: Robert Cruz, [name]  MR#: [id number] BIRTHDATE: Jan 06, 1976 , 37  yrs. old GENDER: Male ENDOSCOPIST: Louis Meckelobert D Kaplan, MD REFERRED BY: PROCEDURE DATE:  12/12/2013 PROCEDURE:  EGD w/ biopsy and Maloney dilation of esophagus ASA CLASS:     Class II INDICATIONS:  Heartburn.   Dysphagia. MEDICATIONS: MAC sedation, administered by CRNA, propofol (Diprivan) 200mg  IV, and Simethicone 0.6cc PO TOPICAL ANESTHETIC:  DESCRIPTION OF PROCEDURE: After the risks benefits and alternatives of the procedure were thoroughly explained, informed consent was obtained.  The LB [id number] endoscope was introduced through the mouth and advanced to the third portion of the duodenum. Without limitations.  The instrument was slowly withdrawn as the mucosa was fully examined.      There was a stricture at the GE junction through which the 9 mm scope easily traversed.  The GE junction was irregular with several areas of gastric appearing epithelium extending 1-2 cm proximally. Biopsies were taken to rule out Barrett's esophagus.   The remainder of the upper endoscopy exam was otherwise normal. Retroflexed views revealed no abnormalities.     The scope was then withdrawn from the patient.  A #52 Jerene DillingFrench Maloney dilator was passed with minimal resistance.  There was no heme.  COMPLICATIONS: There were no complications. ENDOSCOPIC IMPRESSION: 1.  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  irregular GE junction-rule out esophagitis versus Barrett's esophagus  RECOMMENDATIONS: 1.  await biopsy results 2.  continue PPI therapy - aciphex 3.  office visit 4-6 weeks REPEAT EXAM:  eSigned:  Louis Meckelobert D Kaplan, MD 12/12/2013 3:18 PM   CC: Piedad ClimesWilliam Cody Martin, MD

## 2013-12-12 NOTE — Patient Instructions (Signed)
YOU HAD AN ENDOSCOPIC PROCEDURE TODAY AT THE [facility]: Refer to the procedure report that was given to you for any specific questions about what was found during the examination.  If the procedure report does not answer your questions, please call your gastroenterologist to clarify.  If you requested that your care partner not be given the details of your procedure findings, then the procedure report has been included in a sealed envelope for you to review at your convenience later.  YOU SHOULD EXPECT: Some feelings of bloating in the abdomen. Passage of more gas than usual.  Walking can help get rid of the air that was put into your GI tract during the procedure and reduce the bloating. If you had a lower endoscopy (such as a colonoscopy or flexible sigmoidoscopy) you may notice spotting of blood in your stool or on the toilet paper. If you underwent a bowel prep for your procedure, then you may not have a normal bowel movement for a few days.  DIET: FOLLOW DILATION HANDOUT  ACTIVITY: Your care partner should take you home directly after the procedure.  You should plan to take it easy, moving slowly for the rest of the day.  You can resume normal activity the day after the procedure however you should NOT DRIVE or use heavy machinery for 24 hours (because of the sedation medicines used during the test).    SYMPTOMS TO REPORT IMMEDIATELY: A gastroenterologist can be reached at any hour.  During normal business hours, 8:30 AM to 5:00 PM Monday through Friday, call [phone].  After hours and on weekends, please call the GI answering service at [phone] who will take a message and have the physician on call contact you.    Following upper endoscopy (EGD)  Vomiting of blood or coffee ground material  New chest pain or pain under the shoulder blades  Painful or persistently difficult swallowing  New shortness of breath  Fever of 100F or higher  Black, tarry-looking  stools  FOLLOW UP: If any biopsies were taken you will be contacted by phone or by letter within the next 1-3 weeks.  Call your gastroenterologist if you have not heard about the biopsies in 3 weeks.  Our staff will call the home number listed on your records the next business day following your procedure to check on you and address any questions or concerns that you may have at that time regarding the information given to you following your procedure. This is a courtesy call and so if there is no answer at the home number and we have not heard from you through the emergency physician on call, we will assume that you have returned to your regular daily activities without incident.  SIGNATURES/CONFIDENTIALITY: You and/or your care partner have signed paperwork which will be entered into your electronic medical record.  These signatures attest to the fact that that the information above on your After Visit Summary has been reviewed and is understood.  Full responsibility of the confidentiality of this discharge information lies with you and/or your care-partner.  Resume medications. Call office to schedule follow up appointment for 4-6 weeks.

## 2013-12-12 NOTE — Progress Notes (Signed)
Pt c/o right wrist pain at IV site.  I assessed IV, no redness and cool to touch.  IV dripping wide open, not infilrated.  [facility]  I asked the pt id he would like me to remove right wrist IV.  Per Pt, "No, leave it in". Maw

## 2013-12-13 ENCOUNTER — Telehealth: Payer: Self-pay | Admitting: *Deleted

## 2013-12-13 NOTE — Telephone Encounter (Signed)
Number identifier, left message, follow-up  

## 2013-12-18 ENCOUNTER — Encounter: Payer: Self-pay | Admitting: Gastroenterology

## 2013-12-18 ENCOUNTER — Other Ambulatory Visit: Payer: Self-pay | Admitting: Physician Assistant

## 2014-01-14 ENCOUNTER — Encounter: Payer: Self-pay | Admitting: Gastroenterology

## 2014-01-14 ENCOUNTER — Ambulatory Visit (INDEPENDENT_AMBULATORY_CARE_PROVIDER_SITE_OTHER): Payer: Managed Care, Other (non HMO) | Admitting: Gastroenterology

## 2014-01-14 VITALS — BP 132/94 | HR 60 | Ht 71.0 in | Wt 312.0 lb

## 2014-01-14 DIAGNOSIS — R1310 Dysphagia, unspecified: Secondary | ICD-10-CM

## 2014-01-14 DIAGNOSIS — K219 Gastro-esophageal reflux disease without esophagitis: Secondary | ICD-10-CM

## 2014-01-14 NOTE — Progress Notes (Signed)
          History of Present Illness:  The patient has returned following upper endoscopy and dilatation of an early esophageal stricture.  Dysphagia has resolved.  He has only rare pyrosis.  He remains on AcipHex.  He has been taking AcipHex intermittently.    Review of Systems: Pertinent positive and negative review of systems were noted in the above HPI section. All other review of systems were otherwise negative.    Current Medications, Allergies, Past Medical History, Past Surgical History, Family History and Social History were reviewed in [facility]  Vital signs were reviewed in today's medical record. Physical Exam: General: Well developed , well nourished, no acute distress   See Assessment and Plan under Problem List

## 2014-01-14 NOTE — Patient Instructions (Addendum)
Follow up as needed  Gastroesophageal Reflux Disease, Adult Gastroesophageal reflux disease (GERD) happens when acid from your stomach flows up into the esophagus. When acid comes in contact with the esophagus, the acid causes soreness (inflammation) in the esophagus. Over time, GERD may create small holes (ulcers) in the lining of the esophagus. CAUSES   Increased body weight. This puts pressure on the stomach, making acid rise from the stomach into the esophagus.  Smoking. This increases acid production in the stomach.  Drinking alcohol. This causes decreased pressure in the lower esophageal sphincter (valve or ring of muscle between the esophagus and stomach), allowing acid from the stomach into the esophagus.  Late evening meals and a full stomach. This increases pressure and acid production in the stomach.  A malformed lower esophageal sphincter. Sometimes, no cause is found. SYMPTOMS   Burning pain in the lower part of the mid-chest behind the breastbone and in the mid-stomach area. This may occur twice a week or more often.  Trouble swallowing.  Sore throat.  Dry cough.  Asthma-like symptoms including chest tightness, shortness of breath, or wheezing. DIAGNOSIS  Your caregiver may be able to diagnose GERD based on your symptoms. In some cases, X-rays and other tests may be done to check for complications or to check the condition of your stomach and esophagus. TREATMENT  Your caregiver may recommend over-the-counter or prescription medicines to help decrease acid production. Ask your caregiver before starting or adding any new medicines.  HOME CARE INSTRUCTIONS   Change the factors that you can control. Ask your caregiver for guidance concerning weight loss, quitting smoking, and alcohol consumption.  Avoid foods and drinks that make your symptoms worse, such as:  Caffeine or alcoholic drinks.  Chocolate.  Peppermint or mint flavorings.  Garlic and onions.  Spicy  foods.  Citrus fruits, such as oranges, lemons, or limes.  Tomato-based foods such as sauce, chili, salsa, and pizza.  Fried and fatty foods.  Avoid lying down for the 3 hours prior to your bedtime or prior to taking a nap.  Eat small, frequent meals instead of large meals.  Wear loose-fitting clothing. Do not wear anything tight around your waist that causes pressure on your stomach.  Raise the head of your bed 6 to 8 inches with wood blocks to help you sleep. Extra pillows will not help.  Only take over-the-counter or prescription medicines for pain, discomfort, or fever as directed by your caregiver.  Do not take aspirin, ibuprofen, or other nonsteroidal anti-inflammatory drugs (NSAIDs). SEEK IMMEDIATE MEDICAL CARE IF:   You have pain in your arms, neck, jaw, teeth, or back.  Your pain increases or changes in intensity or duration.  You develop nausea, vomiting, or sweating (diaphoresis).  You develop shortness of breath, or you faint.  Your vomit is green, yellow, black, or looks like coffee grounds or blood.  Your stool is red, bloody, or black. These symptoms could be signs of other problems, such as heart disease, gastric bleeding, or esophageal bleeding. MAKE SURE YOU:   Understand these instructions.  Will watch your condition.  Will get help right away if you are not doing well or get worse. Document Released: 08/31/2005 Document Revised: 02/13/2012 Document Reviewed: 06/10/2011 [facility] Patient Information 2014 [place], [place]. Esophageal Stricture The esophagus is the long, narrow tube which carries food and liquid from the mouth to the stomach. Sometimes a part of the esophagus becomes narrow and makes it difficult, painful, or even impossible to swallow. This is  called an esophageal stricture.  CAUSES  Common causes of blockage or strictures of the esophagus are:  Exposure of the lower esophagus to the acid from the stomach may cause narrowing.  Hiatal  hernia in which a small part of the stomach bulges up through the diaphragm can cause a narrowing in the bottom of the esophagus.  Scleroderma is a tissue disorder that affects the esophagus and makes swallowing difficult.  Achalasia is an absence of nerves in the lower esophagus and to the esophageal sphincter. This absence of nerves may be congenital (present since birth). This can cause irregular spasms which do not allow food and fluid through.  Strictures may develop from swallowing materials which damage the esophagus. Examples are acids or alkalis such as lye.  Schatzki's Ring is a narrow ring of non-cancerous tissue which narrows the lower esophagus. The cause of this is unknown.  Growths can block the esophagus. SYMPTOMS  Some of the problems are difficulty swallowing or pain with swallowing. DIAGNOSIS  Your caregiver often suspects this problem by taking a medical history. They will also do a physical exam. They may then take X-rays and/or perform an endoscopy. Endoscopy is an exam in which a tube like a small flexible telescope is used to look at your esophagus.  TREATMENT  One form of treatment is to dilate the narrow area. This means to stretch it.  When this is not successful, chest surgery may be required. This is a much more extensive form of treatment with a longer recovery time. Both of the above treatments make the passage of food and water into the stomach easier. They also make it easier for stomach contents to bubble back into the esophagus. Special medications may be used following the procedure to help prevent further narrowing. Medications may be used to lower the amount of acid in the stomach juice.  SEEK IMMEDIATE MEDICAL CARE IF:   Your swallowing is becoming more painful, difficult, or you are unable to swallow.  You vomit up blood.  You develop black tarry stools.  You develop chills.  You have a fever.  You develop chest or abdominal pain.  You  develop shortness of breath, feel lightheaded, or faint. Follow up with medical care as your caregiver suggests. Document Released: 08/01/2006 Document Revised: 02/13/2012 Document Reviewed: 09/07/2006 [facility] Patient Information 2014 [place], [place].

## 2014-01-14 NOTE — Assessment & Plan Note (Signed)
Resolved following dilatation of a distal esophageal stricture 

## 2014-01-14 NOTE — Assessment & Plan Note (Signed)
Symptoms are well controlled with AcipHex.  He was instructed to try taking it every other day or every third day or, finally, as needed

## 2014-02-10 ENCOUNTER — Telehealth: Payer: Self-pay | Admitting: Gastroenterology

## 2014-02-10 NOTE — Telephone Encounter (Signed)
Called pharmacy for patient  He needed a 90 days supply of Aciphex called in for his insurance to cover

## 2014-03-05 ENCOUNTER — Encounter (HOSPITAL_BASED_OUTPATIENT_CLINIC_OR_DEPARTMENT_OTHER): Payer: Self-pay | Admitting: Emergency Medicine

## 2014-03-05 ENCOUNTER — Emergency Department (HOSPITAL_BASED_OUTPATIENT_CLINIC_OR_DEPARTMENT_OTHER)
Admission: EM | Admit: 2014-03-05 | Discharge: 2014-03-06 | Disposition: A | Discharge status: Home / Self Care | Payer: Managed Care, Other (non HMO) | Attending: Emergency Medicine | Admitting: Emergency Medicine

## 2014-03-05 DIAGNOSIS — I10 Essential (primary) hypertension: Secondary | ICD-10-CM | POA: Insufficient documentation

## 2014-03-05 DIAGNOSIS — L02219 Cutaneous abscess of trunk, unspecified: Secondary | ICD-10-CM | POA: Insufficient documentation

## 2014-03-05 DIAGNOSIS — Z8619 Personal history of other infectious and parasitic diseases: Secondary | ICD-10-CM | POA: Insufficient documentation

## 2014-03-05 DIAGNOSIS — Z79899 Other long term (current) drug therapy: Secondary | ICD-10-CM | POA: Insufficient documentation

## 2014-03-05 DIAGNOSIS — L02211 Cutaneous abscess of abdominal wall: Secondary | ICD-10-CM

## 2014-03-05 DIAGNOSIS — K219 Gastro-esophageal reflux disease without esophagitis: Secondary | ICD-10-CM | POA: Insufficient documentation

## 2014-03-05 DIAGNOSIS — L03319 Cellulitis of trunk, unspecified: Principal | ICD-10-CM

## 2014-03-05 NOTE — ED Notes (Signed)
Pt sts first noticed painful abcess 2 months ago, but it went away. Now has been painful again x 2 days. Denies fever or chills.

## 2014-03-06 MED ORDER — HYDROCODONE-ACETAMINOPHEN 5-325 MG PO TABS: 2.0000 | ORAL_TABLET | ORAL | Status: DC | PRN

## 2014-03-06 NOTE — ED Notes (Signed)
MD at bedside. 

## 2014-03-06 NOTE — ED Provider Notes (Signed)
CSN: [id number]     Arrival date & time 03/05/14  2249 History   First MD Initiated Contact with Patient 03/06/14 0121     Chief Complaint  Patient presents with  . Skin Problem     (Consider location/radiation/quality/duration/timing/severity/associated sxs/prior Treatment) HPI A 38 year old male with a two-day history of a tender, swollen mass on his right anterior belt line. He states it is severely painful and hurts when he walks because his belt rubs against it. He has a history of a similar lesion 2 months ago but it resolved on its own. He denies systemic symptoms such as fever, chills, nausea or vomiting.  Past Medical History  Diagnosis Date  . Hypertension   . GERD (gastroesophageal reflux disease)   . Chicken pox   . Sleep apnea    Past Surgical History  Procedure Laterality Date  . Unremarkable.     Family History  Problem Relation Age of Onset  . Hypertension Father   . Depression Father   . Hypertension Mother   . Kidney disease Maternal Grandmother     Dialysis  . Hypertension Maternal Grandmother   . Healthy Brother     x1  . Healthy Sister     x3  . Colon cancer Neg Hx   . Esophageal cancer Neg Hx   . Rectal cancer Neg Hx   . Stomach cancer Neg Hx    History  Substance Use Topics  . Smoking status: Never Smoker   . Smokeless tobacco: Never Used  . Alcohol Use: 0.0 oz/week     Comment: occasional    Review of Systems  All other systems reviewed and are negative.   Allergies  Review of patient's allergies indicates no known allergies.  Home Medications   Current Outpatient Rx  Name  Route  Sig  Dispense  Refill  . amLODipine (NORVASC) 10 MG tablet      TAKE 1 TABLET (10 MG TOTAL) BY MOUTH DAILY.   90 tablet   1   . Ascorbic Acid (VITAMIN C) 100 MG tablet   Oral   Take 100 mg by mouth daily.         . hydrochlorothiazide (HYDRODIURIL) 25 MG tablet      TAKE 1 TABLET (25 MG TOTAL) BY MOUTH DAILY.   90 tablet   1   .  HYDROcodone-acetaminophen (HYCET) 7.5-325 mg/15 ml solution   Oral   Take 15 mLs by mouth 4 (four) times daily as needed for moderate pain.   120 mL   0   . lisinopril (PRINIVIL,ZESTRIL) 20 MG tablet      TAKE 1 TABLET (20 MG TOTAL) BY MOUTH DAILY.   90 tablet   1   . Multiple Vitamin (MULTIVITAMIN) tablet   Oral   Take 1 tablet by mouth daily.         . RABEprazole (ACIPHEX) 20 MG tablet   Oral   Take 1 tablet (20 mg total) by mouth daily.   30 tablet   3    BP 143/98  Pulse 90  Temp(Src) 98.5 F (36.9 C) (Oral)  Resp 16  Ht 6\' 1"  (1.854 m)  Wt 300 lb (136.079 kg)  BMI 39.59 kg/m2  SpO2 99%  Physical Exam General: Well-developed, well-nourished male in no acute distress; appearance consistent with age of record HENT: normocephalic; atraumatic Eyes: pupils equal, round and reactive to light; extraocular muscles intact Neck: supple Heart: regular rate and rhythm Lungs: clear to auscultation bilaterally Abdomen: soft;  nondistended; nontender except at site of skin lesion Extremities: No deformity; full range of motion; pulses normal Neurologic: Awake, alert and oriented; motor function intact in all extremities and symmetric; no facial droop Skin: Warm and dry; tender, fluctuant, pointing lesion right belt line consistent with abscess Psychiatric: Normal mood and affect    ED Course  Procedures (including critical care time)  INCISION AND DRAINAGE Performed by: Hanley SeamenMOLPUS,Claramae Rigdon L Consent: Verbal consent obtained. Risks and benefits: risks, benefits and alternatives were discussed Type: abscess  Body area: Right lower abdomen at belt line  Anesthesia: local infiltration  Incision was made with a scalpel.  Local anesthetic: lidocaine 2 % with epinephrine  Anesthetic total: 1.5 ml  Complexity: complex Blunt dissection to break up loculations  Drainage: purulent  Drainage amount: Moderate   Packing material: 1/4 in iodoform gauze  Patient tolerance:  Patient tolerated the procedure well with no immediate complications.     MDM      Hanley SeamenJohn L Ernisha Sorn, MD 03/06/14 0157

## 2014-03-06 NOTE — Discharge Instructions (Signed)
Abscess Care After An abscess (also called a boil or furuncle) is an infected area that contains a collection of pus. Signs and symptoms of an abscess include pain, tenderness, redness, or hardness, or you may feel a moveable soft area under your skin. An abscess can occur anywhere in the body. The infection may spread to surrounding tissues causing cellulitis. A cut (incision) by the surgeon was made over your abscess and the pus was drained out. Gauze may have been packed into the space to provide a drain that will allow the cavity to heal from the inside outwards. The boil may be painful for 5 to 7 days. Most people with a boil do not have high fevers.  HOME CARE INSTRUCTIONS   Only take over-the-counter or prescription medicines for pain, discomfort, or fever as directed by your caregiver.  Remove the packing in 2-3 days. If the packing falls out sooner this is not a cause for worry unless your pain and swelling are worsening. SEEK IMMEDIATE MEDICAL CARE IF:   You develop increased pain, swelling, redness, drainage, or bleeding in the wound site.  You develop signs of generalized infection including muscle aches, chills, fever, or a general ill feeling.  An oral temperature above 102 F (38.9 C) develops, not controlled by medication. See your caregiver for a recheck if you develop any of the symptoms described above. If medications (antibiotics) were prescribed, take them as directed. Document Released: 06/09/2005 Document Revised: 02/13/2012 Document Reviewed: 02/04/2008 [facility] Patient Information 2014 [place], [place].

## 2014-03-10 ENCOUNTER — Encounter (HOSPITAL_BASED_OUTPATIENT_CLINIC_OR_DEPARTMENT_OTHER): Payer: Self-pay | Admitting: Emergency Medicine

## 2014-03-10 ENCOUNTER — Emergency Department (HOSPITAL_BASED_OUTPATIENT_CLINIC_OR_DEPARTMENT_OTHER)
Admission: EM | Admit: 2014-03-10 | Discharge: 2014-03-10 | Disposition: A | Discharge status: Home / Self Care | Payer: Managed Care, Other (non HMO) | Attending: Emergency Medicine | Admitting: Emergency Medicine

## 2014-03-10 DIAGNOSIS — Z5189 Encounter for other specified aftercare: Secondary | ICD-10-CM

## 2014-03-10 DIAGNOSIS — K219 Gastro-esophageal reflux disease without esophagitis: Secondary | ICD-10-CM | POA: Insufficient documentation

## 2014-03-10 DIAGNOSIS — Z8619 Personal history of other infectious and parasitic diseases: Secondary | ICD-10-CM | POA: Insufficient documentation

## 2014-03-10 DIAGNOSIS — Z4801 Encounter for change or removal of surgical wound dressing: Secondary | ICD-10-CM | POA: Insufficient documentation

## 2014-03-10 DIAGNOSIS — I10 Essential (primary) hypertension: Secondary | ICD-10-CM | POA: Insufficient documentation

## 2014-03-10 DIAGNOSIS — Z79899 Other long term (current) drug therapy: Secondary | ICD-10-CM | POA: Insufficient documentation

## 2014-03-10 DIAGNOSIS — Z760 Encounter for issue of repeat prescription: Secondary | ICD-10-CM

## 2014-03-10 MED ORDER — AMLODIPINE BESYLATE 10 MG PO TABS: 10.0000 mg | ORAL_TABLET | Freq: Every day | ORAL | Status: DC

## 2014-03-10 MED ORDER — LISINOPRIL 20 MG PO TABS: 20.0000 mg | ORAL_TABLET | Freq: Every day | ORAL | Status: DC

## 2014-03-10 MED ORDER — HYDROCHLOROTHIAZIDE 25 MG PO TABS: 25.0000 mg | ORAL_TABLET | Freq: Every day | ORAL | Status: DC

## 2014-03-10 NOTE — ED Provider Notes (Signed)
CSN: [id number]     Arrival date & time 03/10/14  1243 History   First MD Initiated Contact with Patient 03/10/14 1527     Chief Complaint  Patient presents with  . Abscess   HPI  Robert Cruz is a 38 y.o. male with a PMH of HTN, GERD, chicken pox and sleep apnea who presents to the ED for evaluation of abscess. History was provided by the patient. Patient states that he had an abscess drained on 03/05/14 here in the ED. Abscess located in the right lower abdomen. Area healing well with no drainage, spreading redness/swelling, or pain. Patient prescribed Norco for pain control which he has been taking. No antibiotics. Patient had abscess packed but it fell out. Patient otherwise doing well with no fevers, chills, change in appetite/activity, abdominal pain, nausea, emesis, diarrhea or constipation. Patient requesting refill of his blood pressure medications. Out of HCTZ and lisinopril. Continues to take amlodipine. Will be having insurance in the next few months after starting new job. Requesting refill of HTN medications. No chest pain, headache, or other concerns.      Past Medical History  Diagnosis Date  . Hypertension   . GERD (gastroesophageal reflux disease)   . Chicken pox   . Sleep apnea    Past Surgical History  Procedure Laterality Date  . Unremarkable.     Family History  Problem Relation Age of Onset  . Hypertension Father   . Depression Father   . Hypertension Mother   . Kidney disease Maternal Grandmother     Dialysis  . Hypertension Maternal Grandmother   . Healthy Brother     x1  . Healthy Sister     x3  . Colon cancer Neg Hx   . Esophageal cancer Neg Hx   . Rectal cancer Neg Hx   . Stomach cancer Neg Hx    History  Substance Use Topics  . Smoking status: Never Smoker   . Smokeless tobacco: Never Used  . Alcohol Use: 0.0 oz/week     Comment: occasional    Review of Systems  Constitutional: Negative for fever, chills, diaphoresis, activity change,  appetite change and fatigue.  Cardiovascular: Negative for chest pain.  Gastrointestinal: Negative for nausea, vomiting, abdominal pain, diarrhea and constipation.  Genitourinary: Negative for dysuria.  Musculoskeletal: Negative for back pain and myalgias.  Skin: Positive for wound.  Neurological: Negative for dizziness, weakness, light-headedness and headaches.    Allergies  Review of patient's allergies indicates no known allergies.  Home Medications   Current Outpatient Rx  Name  Route  Sig  Dispense  Refill  . amLODipine (NORVASC) 10 MG tablet      TAKE 1 TABLET (10 MG TOTAL) BY MOUTH DAILY.   90 tablet   1   . amLODipine (NORVASC) 10 MG tablet   Oral   Take 1 tablet (10 mg total) by mouth daily.   30 tablet   2   . Ascorbic Acid (VITAMIN C) 100 MG tablet   Oral   Take 100 mg by mouth daily.         . hydrochlorothiazide (HYDRODIURIL) 25 MG tablet      TAKE 1 TABLET (25 MG TOTAL) BY MOUTH DAILY.   90 tablet   1   . hydrochlorothiazide (HYDRODIURIL) 25 MG tablet   Oral   Take 1 tablet (25 mg total) by mouth daily.   30 tablet   2   . HYDROcodone-acetaminophen (HYCET) 7.5-325 mg/15 ml solution  Oral   Take 15 mLs by mouth 4 (four) times daily as needed for moderate pain.   120 mL   0   . HYDROcodone-acetaminophen (NORCO) 5-325 MG per tablet   Oral   Take 2 tablets by mouth every 4 (four) hours as needed.   6 tablet   0   . lisinopril (PRINIVIL,ZESTRIL) 20 MG tablet      TAKE 1 TABLET (20 MG TOTAL) BY MOUTH DAILY.   90 tablet   1   . lisinopril (PRINIVIL,ZESTRIL) 20 MG tablet   Oral   Take 1 tablet (20 mg total) by mouth daily.   30 tablet   2   . Multiple Vitamin (MULTIVITAMIN) tablet   Oral   Take 1 tablet by mouth daily.         . RABEprazole (ACIPHEX) 20 MG tablet   Oral   Take 1 tablet (20 mg total) by mouth daily.   30 tablet   3    BP 147/91  Pulse 95  Temp(Src) 98.5 F (36.9 C) (Oral)  Resp 16  Ht 6\' 1"  (1.854 m)  Wt  300 lb (136.079 kg)  BMI 39.59 kg/m2  SpO2 97%  Filed Vitals:   03/10/14 1253 03/10/14 1607  BP: 139/84 147/91  Pulse: 103 95  Temp: 98.5 F (36.9 C)   TempSrc: Oral   Resp: 18 16  Height: 6\' 1"  (1.854 m)   Weight: 300 lb (136.079 kg)   SpO2: 100% 97%    Physical Exam  Nursing note and vitals reviewed. Constitutional: He is oriented to person, place, and time. He appears well-developed and well-nourished. No distress.  HENT:  Head: Normocephalic and atraumatic.  Right Ear: External ear normal.  Left Ear: External ear normal.  Nose: Nose normal.  Mouth/Throat: Oropharynx is clear and moist.  Eyes: Conjunctivae are normal. Right eye exhibits no discharge. Left eye exhibits no discharge.  Neck: Normal range of motion. Neck supple.  Cardiovascular: Normal rate.   Pulmonary/Chest: Effort normal.  Abdominal: Soft. He exhibits no distension and no mass. There is no tenderness. There is no rebound and no guarding.  Musculoskeletal: Normal range of motion. He exhibits no edema and no tenderness.       Legs: Neurological: He is alert and oriented to person, place, and time.  Skin: Skin is warm and dry. He is not diaphoretic.  5 mm circular open lesion to the middle lower abdomen with no drainage or surrounding edema/erythema. Area non-tender. No underlying fluctuance or induration    ED Course  Procedures (including critical care time) Labs Review Labs Reviewed - No data to display Imaging Review No results found.   EKG Interpretation None      MDM   Robert Cruz is a 38 y.o. male with a PMH of HTN, GERD, chicken pox and sleep apnea who presents to the ED for evaluation of abscess. Abscess drained in the ED on 03/05/14 and appears to be healing well with no signs or symptoms of infection. Patient afebrile and non-toxic in appearance. Patient had packing placed which he states fell out PTA today. Encouraged continued wound care. Patient also requesting refill of BP  medications. No concerning signs of symptoms. Has BP mildly elevated in the 130-140's today in the ED. Medications refilled. Return precautions, discharge instructions, and follow-up was discussed with the patient before discharge.     Discharge Medication List as of 03/10/2014  4:13 PM    START taking these medications   Details  !!  amLODipine (NORVASC) 10 MG tablet Take 1 tablet (10 mg total) by mouth daily., Starting 03/10/2014, Until Discontinued, Print    !! hydrochlorothiazide (HYDRODIURIL) 25 MG tablet Take 1 tablet (25 mg total) by mouth daily., Starting 03/10/2014, Until Discontinued, Print    !! lisinopril (PRINIVIL,ZESTRIL) 20 MG tablet Take 1 tablet (20 mg total) by mouth daily., Starting 03/10/2014, Until Discontinued, Print     !! - Potential duplicate medications found. Please discuss with provider.       Final impressions: 1. Encounter for wound re-check   2. Encounter for medication refill       Luiz Iron PA-C           Jillyn Ledger, PA-C 03/10/14 [phone]

## 2014-03-10 NOTE — Discharge Instructions (Signed)
Follow-up with your doctor for future re-fills  Continue to perform wound care at home  Return to the emergency department if you develop any changing/worsening condition, spreading redness/swelling, fever, abdominal pain, drainage or any other concerns (please read additional information regarding your condition below)    Wound Check Your wound appears healthy today. Your wound will heal gradually over time. Eventually a scar will form that will fade with time. FACTORS THAT AFFECT SCAR FORMATION:  People differ in the severity in which they scar.  Scar severity varies according to location, size, and the traits you inherited from your parents (genetic predisposition).  Irritation to the wound from infection, rubbing, or chemical exposure will increase the amount of scar formation. HOME CARE INSTRUCTIONS   If you were given a dressing, you should change it at least once a day or as instructed by your caregiver. If the bandage sticks, soak it off with a solution of hydrogen peroxide.  If the bandage becomes wet, dirty, or develops a bad smell, change it as soon as possible.  Look for signs of infection.  Only take over-the-counter or prescription medicines for pain, discomfort, or fever as directed by your caregiver. SEEK IMMEDIATE MEDICAL CARE IF:   You have redness, swelling, or increasing pain in the wound.  You notice pus coming from the wound.  You have a fever.  You notice a bad smell coming from the wound or dressing. Document Released: 08/27/2004 Document Revised: 02/13/2012 Document Reviewed: 11/21/2005 [facility] Patient Information 2014 [place], [place].  Hypertension As your heart beats, it forces blood through your arteries. This force is your blood pressure. If the pressure is too high, it is called hypertension (HTN) or high blood pressure. HTN is dangerous because you may have it and not know it. High blood pressure may mean that your heart has to work harder to pump  blood. Your arteries may be narrow or stiff. The extra work puts you at risk for heart disease, stroke, and other problems.  Blood pressure consists of two numbers, a higher number over a lower, 110/72, for example. It is stated as "110 over 72." The ideal is below 120 for the top number (systolic) and under 80 for the bottom (diastolic). Write down your blood pressure today. You should pay close attention to your blood pressure if you have certain conditions such as:  Heart failure.  Prior heart attack.  Diabetes  Chronic kidney disease.  Prior stroke.  Multiple risk factors for heart disease. To see if you have HTN, your blood pressure should be measured while you are seated with your arm held at the level of the heart. It should be measured at least twice. A one-time elevated blood pressure reading (especially in the Emergency Department) does not mean that you need treatment. There may be conditions in which the blood pressure is different between your right and left arms. It is important to see your caregiver soon for a recheck. Most people have essential hypertension which means that there is not a specific cause. This type of high blood pressure may be lowered by changing lifestyle factors such as:  Stress.  Smoking.  Lack of exercise.  Excessive weight.  Drug/tobacco/alcohol use.  Eating less salt. Most people do not have symptoms from high blood pressure until it has caused damage to the body. Effective treatment can often prevent, delay or reduce that damage. TREATMENT  When a cause has been identified, treatment for high blood pressure is directed at the cause. There  are a large number of medications to treat HTN. These fall into several categories, and your caregiver will help you select the medicines that are best for you. Medications may have side effects. You should review side effects with your caregiver. If your blood pressure stays high after you have made lifestyle  changes or started on medicines,   Your medication(s) may need to be changed.  Other problems may need to be addressed.  Be certain you understand your prescriptions, and know how and when to take your medicine.  Be sure to follow up with your caregiver within the time frame advised (usually within two weeks) to have your blood pressure rechecked and to review your medications.  If you are taking more than one medicine to lower your blood pressure, make sure you know how and at what times they should be taken. Taking two medicines at the same time can result in blood pressure that is too low. SEEK IMMEDIATE MEDICAL CARE IF:  You develop a severe headache, blurred or changing vision, or confusion.  You have unusual weakness or numbness, or a faint feeling.  You have severe chest or abdominal pain, vomiting, or breathing problems. MAKE SURE YOU:   Understand these instructions.  Will watch your condition.  Will get help right away if you are not doing well or get worse. Document Released: 11/21/2005 Document Revised: 02/13/2012 Document Reviewed: 07/11/2008 [facility] Patient Information 2014 [place].   Emergency Department Resource Guide 1) Find a Doctor and Pay Out of Pocket Although you won't have to find out who is covered by your insurance plan, it is a good idea to ask around and get recommendations. You will then need to call the office and see if the doctor you have chosen will accept you as a new patient and what types of options they offer for patients who are self-pay. Some doctors offer discounts or will set up payment plans for their patients who do not have insurance, but you will need to ask so you aren't surprised when you get to your appointment.  2) Contact Your Local Health Department Not all health departments have doctors that can see patients for sick visits, but many do, so it is worth a call to see if yours does. If you don't know where your local health  department is, you can check in your phone book. The CDC also has a tool to help you locate your state's health department, and many state websites also have listings of all of their local health departments.  3) Find a Walk-in Clinic If your illness is not likely to be very severe or complicated, you may want to try a walk in clinic. These are popping up all over the country in pharmacies, drugstores, and shopping centers. They're usually staffed by nurse practitioners or physician assistants that have been trained to treat common illnesses and complaints. They're usually fairly quick and inexpensive. However, if you have serious medical issues or chronic medical problems, these are probably not your best option.  No Primary Care Doctor: - Call Health Connect at  [phone] - they can help you locate a primary care doctor that  accepts your insurance, provides certain services, etc. - Physician Referral Service- [phone]  Chronic Pain Problems: Organization         Address  Phone   Notes  [facility]  [phone] Patients need to be referred by their primary care doctor.   Medication Assistance: Organization  Address  Phone   Notes  [facility] Medication [facility] [address] [place], [place] [address] [phone] --Must be a resident of [facility] -- Must have NO insurance coverage whatsoever (no Medicaid/ Medicare, etc.) -- The pt. MUST have a primary care doctor that directs their care regularly and follows them in the community   Med[facility]ist  [phone]   [employer]  [phone]    Agencies that provide inexpensive medical care: Organization         Address  Phone   Notes  [place]  [phone]   [facility]    [phone]   [facility] [place], [place] [address] [phone]   [place] [address] [place] [phone]   Planned Parenthood    [phone]   [facility]    [phone]   [place] and [place] [address], [place] Phone:  [phone], Fax:  [phone] Hours of Operation:  9 am - 6 pm, M-F.  Also accepts Medicaid/Medicare and self-pay.  [facility] for [place], [address], [place] Phone: [phone], Fax: [phone]. Hours of Operation:  8:30 am - 5:30 pm, M-F.  Also accepts Medicaid and self-pay.  [facility] [address] [place] Phone: [phone]   [place], [place], [place] [phone], Ext. [phone] Mondays & Thursdays: 7-9 AM.  First 15 patients are seen on a first come, first serve basis.    [place] Providers:  Organization         Address  Phone   Notes  [facility] [address], [place] [phone] Also accepts self-pay patients.  [facility] [address], [place]  [phone]   [place], [address], [place] [phone]   [facility] [address] [place] [phone]   Lucianne Lei [address], [place]   [phone] Only accepts [place] Access [place] patients after they have their name applied to their card.   Self-Pay (no insurance) in [facility]:  Organization         Address  Phone   Notes  Sickle Cell Patients, [facility] [place] [phone]   [facility] [place] [phone]   [facility] [place]  [place], [place], [place] [phone]   [facility]/Dr. Osei-Bonsu  [address], [place] or [address], [place] [phone] Phone number for both [place] and  [place] locations is the same.  [facility] [address] [place] [phone]   [facility] [address], [place] or [address] [phone]   [facility] [address] [place] [phone], phone; [phone], fax Sees patients 1st and 3rd Saturday of every month.  Must not qualify for public or private insurance (i.e. Medicaid, Medicare, [place] Health Choice, Veterans' Benefits)  Household income should be no more than 200% of the poverty level The clinic cannot treat you if you are pregnant or think you  are pregnant  Sexually transmitted diseases are not treated at the clinic.    Dental Care: Organization         Address  Phone  Notes  [facility] of [facility] [place] [phone] Accepts children up to age 55 who are enrolled in [place] or [place]; pregnant women with a Medicaid card; and children who have applied for Medicaid or [place] Health Choice, but were declined, whose parents can pay a reduced fee at time of service.  [facility] of [facility]  [address], [place] [phone] Accepts children up to age 76 who are enrolled in [place] or [place]; pregnant women with a Medicaid card; and children who have applied for Medicaid or [place] Health Choice, but were declined, whose parents can pay a reduced fee at time of service.  [facility]  [place] [phone] Patients are seen by appointment only. Walk-ins are not accepted. [place] will see patients 72 years of age and older. Monday - Tuesday (8am-5pm) Most Wednesdays (8:30-5pm) $30 per visit, cash only  [facility] Adult [facility] Access PROGRAM  [address], [facility] [phone] Patients are seen by appointment only. Walk-ins are not accepted.  [place] will see patients 84 years of age and older. One Wednesday Evening (Monthly: Volunteer Based).  $30 per visit, cash only  [place]  [phone] for adults; Children under age 76, call Graduate Pediatric Dentistry at [phone]. Children aged 34-14, please call [phone] to request a pediatric application.  Dental services are provided in all areas of dental care including fillings, crowns and bridges, complete and partial dentures, implants, gum treatment, root canals, and extractions. Preventive care is also provided. Treatment is provided to both adults and children. Patients are selected via a lottery and there is often a waiting list.   [facility] [address] [place]  [phone] [email or web address]   [facility] [address] [place], [place] [phone], Ext. [phone] Second and Fourth Thursday of each month, opens at 6:30 AM; Clinic ends at 9 AM.  Patients are seen on a first-come first-served basis, and a limited number are seen during each clinic.   [facility]  [address] Hillard Danker [place], [place] [phone]   Eligibility Requirements You must have lived in [place], [place], or [place] counties for at least the last three months.   You cannot be eligible for state or federal sponsored [employer], including [employer], [place], or [employer].   You generally cannot be eligible for healthcare insurance through your employer.    How to apply: Eligibility screenings are held every Tuesday and Wednesday afternoon from 1:00 pm until 4:00 pm. You do not need an appointment for the interview!  [facility] [address], [place], [place]   [place]  [place] Department  [place]  [phone]    Behavioral Health Resources in the  Community: Intensive Outpatient Programs Organization         Address  Phone  Notes  [place]. [address] [place], [place] [phone]   [facility] [address] [place], [place]   [facility]: [facility] [address], [place], [place]   [place] [address]  [place], [place] or [phone]   Substance Abuse Resources Organization         Address  Phone  Notes  [facility] Drug Services  [phone]   [place]  [phone]   [facility]   Chinita Pester  [phone]   Residential & Outpatient Substance Abuse Program  [phone]   Psychological Services Organization         Address  Phone  Notes  [facility] [place]  [place]  [phone]   [place] [address] [place] or [phone]    Mobile Crisis Teams Organization         Address  Phone  Notes  [facility], Mobile Crisis Care Unit  [phone]   [facility]  [address] [place], [place]   [place] Levels [address] [place] [phone]    Self-Help/Support Groups Organization         Address  Phone             Notes  [place]. of [place] - variety of support groups  Jane Lew Call for more information  Narcotics Anonymous (NA), [facility] [address], [employer] [place]  2 meetings at this location   Special educational needs teacher         Address  Phone  Notes  [facility] [place],    [place]  [phone]   [facility]  [address] [place] [phone], [place], [place]   [place], [place] [phone] Admissions: 8am-3pm M-F  Incentives Substance [place] [address],    [place], [place] [phone]   [facility] [address] [place], [place], [place]   [facility] [address]  [place], [place]   [facility] - Intensive Outpatient [address] Kristeen [address], [place], [place]   [facility] ([place].,  [place], [place] [phone] or [phone]   [facility] Services (RTS) [address] [place], [place] Accepts Medicaid  [facility] [address]  [place] [phone] Substance Abuse/Addiction Treatment   [facility] Organization         Address  Phone  Notes  [facility]  [phone]   Domenic Schwab, PhD [address] Arlis Porta [place], [place]   [phone] or [phone]   [place], [place] [phone]   [facility] [address] [place], [place] [phone] Insurance/Medicaid/sponsorship through [facility] and Families [address], [address] [place]                                    [place], [place] [phone] [place] [address] [place], [place] [phone]    Dr. Adele Schilder  [phone]   [facility] [place] Dept. 1) [address] [place] 2) [place] 3)  [address], [place] [phone]  [phone]   [place] [phone])  [phone] or [phone] (After Hours)    ° ° ° °

## 2014-03-10 NOTE — ED Notes (Signed)
Requesting recheck of abscess to right abd

## 2014-03-10 NOTE — ED Provider Notes (Signed)
Medical screening examination/treatment/procedure(s) were performed by non-physician practitioner and as supervising physician I was immediately available for consultation/collaboration.     Gunther Zawadzki, MD 03/10/14 1941 

## 2014-08-04 ENCOUNTER — Emergency Department (HOSPITAL_BASED_OUTPATIENT_CLINIC_OR_DEPARTMENT_OTHER)
Admission: EM | Admit: 2014-08-04 | Discharge: 2014-08-04 | Disposition: A | Discharge status: Home / Self Care | Payer: Managed Care, Other (non HMO) | Attending: Emergency Medicine | Admitting: Emergency Medicine

## 2014-08-04 ENCOUNTER — Encounter (HOSPITAL_BASED_OUTPATIENT_CLINIC_OR_DEPARTMENT_OTHER): Payer: Self-pay | Admitting: Emergency Medicine

## 2014-08-04 DIAGNOSIS — I10 Essential (primary) hypertension: Secondary | ICD-10-CM | POA: Insufficient documentation

## 2014-08-04 DIAGNOSIS — R51 Headache: Secondary | ICD-10-CM | POA: Insufficient documentation

## 2014-08-04 DIAGNOSIS — Z79899 Other long term (current) drug therapy: Secondary | ICD-10-CM | POA: Insufficient documentation

## 2014-08-04 DIAGNOSIS — K219 Gastro-esophageal reflux disease without esophagitis: Secondary | ICD-10-CM | POA: Insufficient documentation

## 2014-08-04 DIAGNOSIS — Z8619 Personal history of other infectious and parasitic diseases: Secondary | ICD-10-CM | POA: Insufficient documentation

## 2014-08-04 LAB — BASIC METABOLIC PANEL
ANION GAP: 14 (ref 5–15)
BUN: 11 mg/dL (ref 6–23)
CALCIUM: 9.9 mg/dL (ref 8.4–10.5)
CO2: 26 meq/L (ref 19–32)
CREATININE: 1 mg/dL (ref 0.50–1.35)
Chloride: 100 mEq/L (ref 96–112)
GFR calc Af Amer: >90 mL/min (ref >90)
GFR calc non Af Amer: >90 mL/min (ref >90)
GLUCOSE: 142 mg/dL — AB (ref 70–99)
Potassium: 3.6 mEq/L — ABNORMAL LOW (ref 3.7–5.3)
Sodium: 140 mEq/L (ref 137–147)

## 2014-08-04 MED ORDER — HYDROCHLOROTHIAZIDE 25 MG PO TABS: 25.0000 mg | ORAL_TABLET | Freq: Every day | ORAL | Status: DC

## 2014-08-04 MED ORDER — AMLODIPINE BESYLATE 10 MG PO TABS: 10.0000 mg | ORAL_TABLET | Freq: Every day | ORAL | Status: DC

## 2014-08-04 MED ORDER — LISINOPRIL 20 MG PO TABS: 20.0000 mg | ORAL_TABLET | Freq: Every day | ORAL | Status: DC

## 2014-08-04 NOTE — Discharge Instructions (Signed)

## 2014-08-04 NOTE — ED Provider Notes (Signed)
CSN: [id number]     Arrival date & time 08/04/14  2023 History   This chart was scribed for Nelia Shi, MD by Jarvis Morgan, ED Scribe. This patient was seen in room MH08/MH08 and the patient's care was started at 10:36 PM.    Chief Complaint  Patient presents with  . Hypertension     The history is provided by the patient. No language interpreter was used.   HPI Comments: Robert Cruz is a 38 y.o. male with a h/o HTN, GERD, and sleep apnea who presents to the Emergency Department complaining of high blood pressure that began tonight. Pt states he was at his drug store and he checked his BP on their machine and he states it was high. He admits that  He admits he ran out of his HTN medication 3 weeks ago. He takes Hydrodiuril, Norvasc, and lisinopril. He has been complaining of intermittent, sudden, sharp HAs and dizziness. He reports that he has not had these symptoms while taking his BP medications. He is a non smoker. He states that he tries to limit his sodium intake. He denies any CP, shortness of breath, or visual disturbance.   Past Medical History  Diagnosis Date  . Hypertension   . GERD (gastroesophageal reflux disease)   . Chicken pox   . Sleep apnea    Past Surgical History  Procedure Laterality Date  . Unremarkable.     Family History  Problem Relation Age of Onset  . Hypertension Father   . Depression Father   . Hypertension Mother   . Kidney disease Maternal Grandmother     Dialysis  . Hypertension Maternal Grandmother   . Healthy Brother     x1  . Healthy Sister     x3  . Colon cancer Neg Hx   . Esophageal cancer Neg Hx   . Rectal cancer Neg Hx   . Stomach cancer Neg Hx    History  Substance Use Topics  . Smoking status: Never Smoker   . Smokeless tobacco: Never Used  . Alcohol Use: 0.0 oz/week     Comment: occasional    Review of Systems  Eyes: Negative for visual disturbance.  Respiratory: Negative for shortness of breath.    Cardiovascular: Negative for chest pain.  Neurological: Positive for dizziness and headaches.  A complete 10 system review of systems was obtained and all systems are negative except as noted in the HPI and PMH.      Allergies  Review of patient's allergies indicates no known allergies.  Home Medications   Prior to Admission medications   Medication Sig Start Date End Date Taking? Authorizing Provider  amLODipine (NORVASC) 10 MG tablet Take 1 tablet (10 mg total) by mouth daily. 03/10/14   Jillyn Ledger, PA-C  amLODipine (NORVASC) 10 MG tablet Take 1 tablet (10 mg total) by mouth daily. 08/04/14   Nelia Shi, MD  Ascorbic Acid (VITAMIN C) 100 MG tablet Take 100 mg by mouth daily.    Historical Provider, MD  hydrochlorothiazide (HYDRODIURIL) 25 MG tablet Take 1 tablet (25 mg total) by mouth daily. 03/10/14   Jillyn Ledger, PA-C  hydrochlorothiazide (HYDRODIURIL) 25 MG tablet Take 1 tablet (25 mg total) by mouth daily. 08/04/14   Nelia Shi, MD  HYDROcodone-acetaminophen (HYCET) 7.5-325 mg/15 ml solution Take 15 mLs by mouth 4 (four) times daily as needed for moderate pain. 11/09/13 11/09/14  Rolan Bucco, MD  HYDROcodone-acetaminophen (NORCO) 5-325 MG per tablet Take  2 tablets by mouth every 4 (four) hours as needed. 03/06/14   John L Molpus, MD  lisinopril (PRINIVIL,ZESTRIL) 20 MG tablet Take 1 tablet (20 mg total) by mouth daily. 03/10/14   Jillyn Ledger, PA-C  lisinopril (PRINIVIL,ZESTRIL) 20 MG tablet Take 1 tablet (20 mg total) by mouth daily. 08/04/14   Nelia Shi, MD  Multiple Vitamin (MULTIVITAMIN) tablet Take 1 tablet by mouth daily.    Historical Provider, MD  RABEprazole (ACIPHEX) 20 MG tablet Take 1 tablet (20 mg total) by mouth daily. 10/18/13   Louis Meckel, MD   Triage Vitals: BP 151/89  Pulse 86  Temp(Src) 98.3 F (36.8 C) (Oral)  Resp 18  Ht  (1.854 m)  Wt 285 lb (129.275 kg)  BMI 37.61 kg/m2  SpO2 96%  Physical Exam Physical Exam  Nursing  note and vitals reviewed. Constitutional: He is oriented to person, place, and time. He appears well-developed and well-nourished. No distress.  HENT:  Head: Normocephalic and atraumatic.  Eyes: Pupils are equal, round, and reactive to light.  Neck: Normal range of motion.  Cardiovascular: Normal rate and intact distal pulses.   Pulmonary/Chest: No respiratory distress.  Abdominal: Normal appearance. He exhibits no distension.  Musculoskeletal: Normal range of motion.  Neurological: He is alert and oriented to person, place, and time. No cranial nerve deficit.  Skin: Skin is warm and dry. No rash noted.  Psychiatric: He has a normal mood and affect. His behavior is normal.   ED Course  Procedures (including critical care time)  DIAGNOSTIC STUDIES: Oxygen Saturation is 96% on RA, adequate by my interpretation.    COORDINATION OF CARE:    Labs Review Labs Reviewed  BASIC METABOLIC PANEL - Abnormal; Notable for the following:    Potassium 3.6 (*)    Glucose, Bld 142 (*)    All other components within normal limits    Imaging Review No results found.    MDM   Final diagnoses:  Essential hypertension    I personally performed the services described in this documentation, which was scribed in my presence. The recorded information has been reviewed and considered.     Nelia Shi, MD 08/04/14 [phone]

## 2014-08-04 NOTE — ED Notes (Signed)
Headache and dizziness. He ran out of BP medication 3 weeks ago.

## 2014-08-24 ENCOUNTER — Emergency Department (HOSPITAL_BASED_OUTPATIENT_CLINIC_OR_DEPARTMENT_OTHER)
Admission: EM | Admit: 2014-08-24 | Discharge: 2014-08-24 | Disposition: A | Discharge status: Home / Self Care | Payer: Managed Care, Other (non HMO) | Attending: Emergency Medicine | Admitting: Emergency Medicine

## 2014-08-24 ENCOUNTER — Encounter (HOSPITAL_BASED_OUTPATIENT_CLINIC_OR_DEPARTMENT_OTHER): Payer: Self-pay | Admitting: Emergency Medicine

## 2014-08-24 ENCOUNTER — Emergency Department (HOSPITAL_BASED_OUTPATIENT_CLINIC_OR_DEPARTMENT_OTHER): Payer: Managed Care, Other (non HMO)

## 2014-08-24 ENCOUNTER — Telehealth: Payer: Self-pay | Admitting: Physician Assistant

## 2014-08-24 DIAGNOSIS — R0600 Dyspnea, unspecified: Secondary | ICD-10-CM

## 2014-08-24 DIAGNOSIS — Z79899 Other long term (current) drug therapy: Secondary | ICD-10-CM | POA: Insufficient documentation

## 2014-08-24 DIAGNOSIS — I10 Essential (primary) hypertension: Secondary | ICD-10-CM | POA: Insufficient documentation

## 2014-08-24 DIAGNOSIS — Z8619 Personal history of other infectious and parasitic diseases: Secondary | ICD-10-CM | POA: Insufficient documentation

## 2014-08-24 DIAGNOSIS — F411 Generalized anxiety disorder: Secondary | ICD-10-CM | POA: Insufficient documentation

## 2014-08-24 DIAGNOSIS — K219 Gastro-esophageal reflux disease without esophagitis: Secondary | ICD-10-CM | POA: Insufficient documentation

## 2014-08-24 DIAGNOSIS — R42 Dizziness and giddiness: Secondary | ICD-10-CM | POA: Insufficient documentation

## 2014-08-24 DIAGNOSIS — F419 Anxiety disorder, unspecified: Secondary | ICD-10-CM

## 2014-08-24 LAB — CBC WITH DIFFERENTIAL/PLATELET
BASOS PCT: 1 % (ref 0–1)
Basophils Absolute: 0.1 10*3/uL (ref 0.0–0.1)
EOS PCT: 6 % — AB (ref 0–5)
Eosinophils Absolute: 0.4 10*3/uL (ref 0.0–0.7)
HEMATOCRIT: 38.1 % — AB (ref 39.0–52.0)
Hemoglobin: 12.9 g/dL — ABNORMAL LOW (ref 13.0–17.0)
Lymphocytes Relative: 41 % (ref 12–46)
Lymphs Abs: 3 10*3/uL (ref 0.7–4.0)
MCH: 27.6 pg (ref 26.0–34.0)
MCHC: 33.9 g/dL (ref 30.0–36.0)
MCV: 81.6 fL (ref 78.0–100.0)
MONO ABS: 0.5 10*3/uL (ref 0.1–1.0)
Monocytes Relative: 7 % (ref 3–12)
Neutro Abs: 3.3 10*3/uL (ref 1.7–7.7)
Neutrophils Relative %: 45 % (ref 43–77)
Platelets: 308 10*3/uL (ref 150–400)
RBC: 4.67 MIL/uL (ref 4.22–5.81)
RDW: 14.5 % (ref 11.5–15.5)
WBC: 7.3 10*3/uL (ref 4.0–10.5)

## 2014-08-24 LAB — COMPREHENSIVE METABOLIC PANEL
ALBUMIN: 3.6 g/dL (ref 3.5–5.2)
ALT: 14 U/L (ref 0–53)
ANION GAP: 14 (ref 5–15)
AST: 15 U/L (ref 0–37)
Alkaline Phosphatase: 68 U/L (ref 39–117)
BUN: 12 mg/dL (ref 6–23)
CO2: 26 mEq/L (ref 19–32)
CREATININE: 1 mg/dL (ref 0.50–1.35)
Calcium: 9.2 mg/dL (ref 8.4–10.5)
Chloride: 100 mEq/L (ref 96–112)
GFR calc Af Amer: >90 mL/min (ref >90)
GFR calc non Af Amer: >90 mL/min (ref >90)
Glucose, Bld: 130 mg/dL — ABNORMAL HIGH (ref 70–99)
Potassium: 3.3 mEq/L — ABNORMAL LOW (ref 3.7–5.3)
Sodium: 140 mEq/L (ref 137–147)
Total Bilirubin: 0.2 mg/dL — ABNORMAL LOW (ref 0.3–1.2)
Total Protein: 7.5 g/dL (ref 6.0–8.3)

## 2014-08-24 LAB — PRO B NATRIURETIC PEPTIDE: PRO B NATRI PEPTIDE: 11.2 pg/mL (ref 0–125)

## 2014-08-24 LAB — TROPONIN I

## 2014-08-24 LAB — D-DIMER, QUANTITATIVE: D-Dimer, Quant: <0.27 ug/mL-FEU (ref 0.00–0.48)

## 2014-08-24 NOTE — ED Notes (Signed)
Patient states that he is having dizziness and SOb when laying down. The patient denies any SOB with dyspnea

## 2014-08-24 NOTE — Telephone Encounter (Signed)
Patient has been seen multiple times in the ER for anxiety, dizziness and his blood pressure.  Please call patient to advise him to schedule a follow-up appointment with our office so that we can get his symptoms under better control.

## 2014-08-24 NOTE — ED Provider Notes (Signed)
CSN: [id number]     Arrival date & time 08/24/14  0422 History   First MD Initiated Contact with Patient 08/24/14 [phone]     Chief Complaint  Patient presents with  . Dizziness  . Shortness of Breath     (Consider location/radiation/quality/duration/timing/severity/associated sxs/prior Treatment) HPI Patient states he's been under a lot of stress lately. He was unable to sleep this evening. He was lying down and became anxious and short of breath. He states he was breathing fast. He denies any cough or shortness of breath associated with this. Denied metallic taste in the back of his mouth. States he calmed down and his symptoms improved. He then tried to lay back down with the same symptoms. He states this point he jumped out of his bed and became lightheaded. There was no syncope. No visual loss. No focal weakness or numbness. This point he called EMS but then decided to take a private vehicle to the emergency department. Since being in the emergency department his symptoms have completely resolved. He'll or has any shortness of breath. He has no lightheadedness. He continues to deny any chest pain. He's had no recent fever chills. He denies any extended travel or recent surgeries. He said no lower extremity swelling or pain. Past Medical History  Diagnosis Date  . Hypertension   . GERD (gastroesophageal reflux disease)   . Chicken pox   . Sleep apnea    Past Surgical History  Procedure Laterality Date  . Unremarkable.     Family History  Problem Relation Age of Onset  . Hypertension Father   . Depression Father   . Hypertension Mother   . Kidney disease Maternal Grandmother     Dialysis  . Hypertension Maternal Grandmother   . Healthy Brother     x1  . Healthy Sister     x3  . Colon cancer Neg Hx   . Esophageal cancer Neg Hx   . Rectal cancer Neg Hx   . Stomach cancer Neg Hx    History  Substance Use Topics  . Smoking status: Never Smoker   . Smokeless tobacco: Never Used   . Alcohol Use: 0.0 oz/week     Comment: occasional    Review of Systems  Constitutional: Negative for fever and chills.  Respiratory: Positive for shortness of breath. Negative for cough, chest tightness and wheezing.   Cardiovascular: Negative for chest pain, palpitations and leg swelling.  Gastrointestinal: Negative for nausea, vomiting, abdominal pain and diarrhea.  Musculoskeletal: Negative for back pain, neck pain and neck stiffness.  Skin: Negative for rash and wound.  Neurological: Positive for dizziness and light-headedness. Negative for syncope, weakness, numbness and headaches.  Psychiatric/Behavioral: The patient is nervous/anxious.   All other systems reviewed and are negative.     Allergies  Review of patient's allergies indicates no known allergies.  Home Medications   Prior to Admission medications   Medication Sig Start Date End Date Taking? Authorizing Provider  amLODipine (NORVASC) 10 MG tablet Take 1 tablet (10 mg total) by mouth daily. 03/10/14   Jillyn Ledger, PA-C  amLODipine (NORVASC) 10 MG tablet Take 1 tablet (10 mg total) by mouth daily. 08/04/14   Nelia Shi, MD  Ascorbic Acid (VITAMIN C) 100 MG tablet Take 100 mg by mouth daily.    Historical Provider, MD  hydrochlorothiazide (HYDRODIURIL) 25 MG tablet Take 1 tablet (25 mg total) by mouth daily. 03/10/14   Jillyn Ledger, PA-C  hydrochlorothiazide (HYDRODIURIL) 25 MG tablet  Take 1 tablet (25 mg total) by mouth daily. 08/04/14   Nelia Shi, MD  HYDROcodone-acetaminophen (HYCET) 7.5-325 mg/15 ml solution Take 15 mLs by mouth 4 (four) times daily as needed for moderate pain. 11/09/13 11/09/14  Rolan Bucco, MD  HYDROcodone-acetaminophen (NORCO) 5-325 MG per tablet Take 2 tablets by mouth every 4 (four) hours as needed. 03/06/14   John L Molpus, MD  lisinopril (PRINIVIL,ZESTRIL) 20 MG tablet Take 1 tablet (20 mg total) by mouth daily. 03/10/14   Jillyn Ledger, PA-C  lisinopril (PRINIVIL,ZESTRIL) 20 MG  tablet Take 1 tablet (20 mg total) by mouth daily. 08/04/14   Nelia Shi, MD  Multiple Vitamin (MULTIVITAMIN) tablet Take 1 tablet by mouth daily.    Historical Provider, MD  RABEprazole (ACIPHEX) 20 MG tablet Take 1 tablet (20 mg total) by mouth daily. 10/18/13   Louis Meckel, MD   BP 144/94  Pulse 71  Temp(Src) 98.6 F (37 C) (Oral)  Resp 16  Wt 300 lb (136.079 kg)  SpO2 100% Physical Exam  Nursing note and vitals reviewed. Constitutional: He is oriented to person, place, and time. He appears well-developed and well-nourished. No distress.  HENT:  Head: Normocephalic and atraumatic.  Mouth/Throat: Oropharynx is clear and moist.  Eyes: EOM are normal. Pupils are equal, round, and reactive to light.  Neck: Normal range of motion. Neck supple.  Cardiovascular: Normal rate and regular rhythm.  Exam reveals no gallop and no friction rub.   No murmur heard. Pulmonary/Chest: Effort normal and breath sounds normal. No respiratory distress. He has no wheezes. He has no rales. He exhibits no tenderness.  Abdominal: Soft. Bowel sounds are normal. He exhibits no distension and no mass. There is no tenderness. There is no rebound and no guarding.  Musculoskeletal: Normal range of motion. He exhibits no edema and no tenderness.  No calf swelling or tenderness.  Neurological: He is alert and oriented to person, place, and time.  Patient is alert and oriented x3 with clear, goal oriented speech. Patient has 5/5 motor in all extremities. Sensation is intact to light touch.   Skin: Skin is warm and dry. No rash noted. No erythema.  Psychiatric: His behavior is normal.  Mild anxiety    ED Course  Procedures (including critical care time) Labs Review Labs Reviewed  CBC WITH DIFFERENTIAL - Abnormal; Notable for the following:    Hemoglobin 12.9 (*)    HCT 38.1 (*)    Eosinophils Relative 6 (*)    All other components within normal limits  COMPREHENSIVE METABOLIC PANEL - Abnormal; Notable  for the following:    Potassium 3.3 (*)    Glucose, Bld 130 (*)    Total Bilirubin 0.2 (*)    All other components within normal limits  TROPONIN I  PRO B NATRIURETIC PEPTIDE  D-DIMER, QUANTITATIVE    Imaging Review Dg Chest 2 View  08/24/2014   CLINICAL DATA:  Dizziness and shortness of breath when lying down.  EXAM: CHEST  2 VIEW  COMPARISON:  Chest radiograph September 29, 2012  FINDINGS: Cardiomediastinal silhouette is unremarkable. The lungs are clear without pleural effusions or focal consolidations. Trachea projects midline and there is no pneumothorax. Soft tissue planes and included osseous structures are non-suspicious. Large body habitus.  IMPRESSION: No acute cardiopulmonary process.   Electronically Signed   By: Awilda Metro   On: 08/24/2014 04:50     EKG Interpretation None      Date: 08/24/2014  Rate: 79  Rhythm: normal sinus rhythm  QRS Axis: normal  Intervals: normal  ST/T Wave abnormalities: normal  Conduction Disutrbances:none  Narrative Interpretation:   Old EKG Reviewed: none available   MDM   Final diagnoses:  Anxiety  Dyspnea   Patient's symptoms have completely resolved. Workup is essentially negative. Normal chest x-ray, EKG, troponin, d-dimer. Question whether acute dyspnea related to GERD/aspiration and anxiety. Patient states he's had no "heartburn symptoms". He's advised to followup with his primary Dr. Return precautions have been given.     Loren Racer, MD 08/24/14 [phone]

## 2014-08-24 NOTE — Discharge Instructions (Signed)
Panic Attacks °Panic attacks are sudden, short-lived surges of severe anxiety, fear, or discomfort. They may occur for no reason when you are relaxed, when you are anxious, or when you are sleeping. Panic attacks may occur for a number of reasons:  °· Healthy people occasionally have panic attacks in extreme, life-threatening situations, such as war or natural disasters. Normal anxiety is a protective mechanism of the body that helps us react to danger (fight or flight response). °· Panic attacks are often seen with anxiety disorders, such as panic disorder, social anxiety disorder, generalized anxiety disorder, and phobias. Anxiety disorders cause excessive or uncontrollable anxiety. They may interfere with your relationships or other life activities. °· Panic attacks are sometimes seen with other mental illnesses, such as depression and posttraumatic stress disorder. °· Certain medical conditions, prescription medicines, and drugs of abuse can cause panic attacks. °SYMPTOMS  °Panic attacks start suddenly, peak within 20 minutes, and are accompanied by four or more of the following symptoms: °· Pounding heart or fast heart rate (palpitations). °· Sweating. °· Trembling or shaking. °· Shortness of breath or feeling smothered. °· Feeling choked. °· Chest pain or discomfort. °· Nausea or strange feeling in your stomach. °· Dizziness, light-headedness, or feeling like you will faint. °· Chills or hot flushes. °· Numbness or tingling in your lips or hands and feet. °· Feeling that things are not real or feeling that you are not yourself. °· Fear of losing control or going crazy. °· Fear of dying. °Some of these symptoms can mimic serious medical conditions. For example, you may think you are having a heart attack. Although panic attacks can be very scary, they are not life threatening. °DIAGNOSIS  °Panic attacks are diagnosed through an assessment by your health care provider. Your health care provider will ask  questions about your symptoms, such as where and when they occurred. Your health care provider will also ask about your medical history and use of alcohol and drugs, including prescription medicines. Your health care provider may order blood tests or other studies to rule out a serious medical condition. Your health care provider may refer you to a mental health professional for further evaluation. °TREATMENT  °· Most healthy people who have one or two panic attacks in an extreme, life-threatening situation will not require treatment. °· The treatment for panic attacks associated with anxiety disorders or other mental illness typically involves counseling with a mental health professional, medicine, or a combination of both. Your health care provider will help determine what treatment is best for you. °· Panic attacks due to physical illness usually go away with treatment of the illness. If prescription medicine is causing panic attacks, talk with your health care provider about stopping the medicine, decreasing the dose, or substituting another medicine. °· Panic attacks due to alcohol or drug abuse go away with abstinence. Some adults need professional help in order to stop drinking or using drugs. °HOME CARE INSTRUCTIONS  °· Take all medicines as directed by your health care provider.   °· Schedule and attend follow-up visits as directed by your health care provider. It is important to keep all your appointments. °SEEK MEDICAL CARE IF: °· You are not able to take your medicines as prescribed. °· Your symptoms do not improve or get worse. °SEEK IMMEDIATE MEDICAL CARE IF:  °· You experience panic attack symptoms that are different than your usual symptoms. °· You have serious thoughts about hurting yourself or others. °· You are taking medicine for panic attacks and   have a serious side effect. °MAKE SURE YOU: °· Understand these instructions. °· Will watch your condition. °· Will get help right away if you are not  doing well or get worse. °Document Released: 11/21/2005 Document Revised: 11/26/2013 Document Reviewed: 07/05/2013 °[employer]® Patient Information ©2015 [employer]. This information is not intended to replace advice given to you by your health care provider. Make sure you discuss any questions you have with your health care provider. ° °Shortness of Breath °Shortness of breath means you have trouble breathing. It could also mean that you have a medical problem. You should get immediate medical care for shortness of breath. °CAUSES  °· Not enough oxygen in the air such as with high altitudes or a smoke-filled room. °· Certain lung diseases, infections, or problems. °· Heart disease or conditions, such as angina or heart failure. °· Low red blood cells (anemia). °· Poor physical fitness, which can cause shortness of breath when you exercise. °· Chest or back injuries or stiffness. °· Being overweight. °· Smoking. °· Anxiety, which can make you feel like you are not getting enough air. °DIAGNOSIS  °Serious medical problems can often be found during your physical exam. Tests may also be done to determine why you are having shortness of breath. Tests may include: °· Chest X-rays. °· Lung function tests. °· Blood tests. °· An electrocardiogram (ECG). °· An ambulatory electrocardiogram. An ambulatory ECG records your heartbeat patterns over a 24-hour period. °· Exercise testing. °· A transthoracic echocardiogram (TTE). During echocardiography, sound waves are used to evaluate how blood flows through your heart. °· A transesophageal echocardiogram (TEE). °· Imaging scans. °Your health care provider may not be able to find a cause for your shortness of breath after your exam. In this case, it is important to have a follow-up exam with your health care provider as directed.  °TREATMENT  °Treatment for shortness of breath depends on the cause of your symptoms and can vary greatly. °HOME CARE INSTRUCTIONS  °· Do not smoke.  Smoking is a common cause of shortness of breath. If you smoke, ask for help to quit. °· Avoid being around chemicals or things that may bother your breathing, such as paint fumes and dust. °· Rest as needed. Slowly resume your usual activities. °· If medicines were prescribed, take them as directed for the full length of time directed. This includes oxygen and any inhaled medicines. °· Keep all follow-up appointments as directed by your health care provider. °SEEK MEDICAL CARE IF:  °· Your condition does not improve in the time expected. °· You have a hard time doing your normal activities even with rest. °· You have any new symptoms. °SEEK IMMEDIATE MEDICAL CARE IF:  °· Your shortness of breath gets worse. °· You feel light-headed, faint, or develop a cough not controlled with medicines. °· You start coughing up blood. °· You have pain with breathing. °· You have chest pain or pain in your arms, shoulders, or abdomen. °· You have a fever. °· You are unable to walk up stairs or exercise the way you normally do. °MAKE SURE YOU: °· Understand these instructions. °· Will watch your condition. °· Will get help right away if you are not doing well or get worse. °Document Released: 08/16/2001 Document Revised: 11/26/2013 Document Reviewed: 02/06/2012 °ExitCare® Patient Information ©2015 [employer]. This information is not intended to replace advice given to you by your health care provider. Make sure you discuss any questions you have with your health care provider. ° °

## 2014-08-25 NOTE — Telephone Encounter (Signed)
Phone is not accepting incoming calls

## 2014-10-30 ENCOUNTER — Emergency Department (HOSPITAL_BASED_OUTPATIENT_CLINIC_OR_DEPARTMENT_OTHER)
Admission: EM | Admit: 2014-10-30 | Discharge: 2014-10-30 | Disposition: A | Discharge status: Home / Self Care | Payer: Managed Care, Other (non HMO) | Attending: Emergency Medicine | Admitting: Emergency Medicine

## 2014-10-30 ENCOUNTER — Encounter (HOSPITAL_BASED_OUTPATIENT_CLINIC_OR_DEPARTMENT_OTHER): Payer: Self-pay | Admitting: Emergency Medicine

## 2014-10-30 DIAGNOSIS — Z79899 Other long term (current) drug therapy: Secondary | ICD-10-CM | POA: Insufficient documentation

## 2014-10-30 DIAGNOSIS — K219 Gastro-esophageal reflux disease without esophagitis: Secondary | ICD-10-CM | POA: Insufficient documentation

## 2014-10-30 DIAGNOSIS — Z8669 Personal history of other diseases of the nervous system and sense organs: Secondary | ICD-10-CM | POA: Insufficient documentation

## 2014-10-30 DIAGNOSIS — I10 Essential (primary) hypertension: Secondary | ICD-10-CM | POA: Insufficient documentation

## 2014-10-30 DIAGNOSIS — M62830 Muscle spasm of back: Secondary | ICD-10-CM

## 2014-10-30 DIAGNOSIS — Z8619 Personal history of other infectious and parasitic diseases: Secondary | ICD-10-CM | POA: Insufficient documentation

## 2014-10-30 LAB — URINALYSIS, ROUTINE W REFLEX MICROSCOPIC
Bilirubin Urine: NEGATIVE
Glucose, UA: NEGATIVE mg/dL
HGB URINE DIPSTICK: NEGATIVE
KETONES UR: NEGATIVE mg/dL
Leukocytes, UA: NEGATIVE
Nitrite: NEGATIVE
PROTEIN: NEGATIVE mg/dL
Specific Gravity, Urine: 1.014 (ref 1.005–1.030)
UROBILINOGEN UA: 1 mg/dL (ref 0.0–1.0)
pH: 5.5 (ref 5.0–8.0)

## 2014-10-30 MED ORDER — MELOXICAM 7.5 MG PO TABS: 7.5000 mg | ORAL_TABLET | Freq: Every day | ORAL | Status: DC

## 2014-10-30 MED ORDER — LISINOPRIL 20 MG PO TABS: 20.0000 mg | ORAL_TABLET | Freq: Every day | ORAL | Status: DC

## 2014-10-30 MED ORDER — AMLODIPINE BESYLATE 10 MG PO TABS: 10.0000 mg | ORAL_TABLET | Freq: Every day | ORAL | Status: DC

## 2014-10-30 MED ORDER — KETOROLAC TROMETHAMINE 60 MG/2ML IM SOLN
60.0000 mg | Freq: Once | INTRAMUSCULAR | Status: AC
  Administered 2014-10-30: 60 mg via INTRAMUSCULAR
  Filled 2014-10-30: qty 2

## 2014-10-30 MED ORDER — HYDROCHLOROTHIAZIDE 25 MG PO TABS: 25.0000 mg | ORAL_TABLET | Freq: Every day | ORAL | Status: DC

## 2014-10-30 MED ORDER — METHOCARBAMOL 500 MG PO TABS: 500.0000 mg | ORAL_TABLET | Freq: Two times a day (BID) | ORAL | Status: DC

## 2014-10-30 MED ORDER — METHOCARBAMOL 500 MG PO TABS
1000.0000 mg | ORAL_TABLET | Freq: Once | ORAL | Status: AC
  Administered 2014-10-30: 1000 mg via ORAL
  Filled 2014-10-30: qty 2

## 2014-10-30 NOTE — ED Notes (Signed)
States that he was watching a movie tonight, got up and developed severe left back pain that radiates through to the left flank

## 2014-10-30 NOTE — ED Provider Notes (Signed)
CSN: [id number]     Arrival date & time 10/30/14  0115 History   First MD Initiated Contact with Patient 10/30/14 0230     Chief Complaint  Patient presents with  . Flank Pain     (Consider location/radiation/quality/duration/timing/severity/associated sxs/prior Treatment) Patient is a 38 y.o. male presenting with back pain. The history is provided by the patient.  Back Pain Location:  Sacro-iliac joint and lumbar spine Quality:  Stabbing Radiates to:  Does not radiate Pain severity:  Severe Pain is:  Same all the time Onset quality:  Sudden Duration:  1 hour Timing:  Constant Progression:  Unchanged Chronicity:  New Context: not falling, not MCA and not MVA   Relieved by:  Nothing Worsened by:  Nothing tried Ineffective treatments:  None tried Associated symptoms: no fever, no headaches, no leg pain, no numbness, no pelvic pain, no perianal numbness, no tingling, no weakness and no weight loss     Past Medical History  Diagnosis Date  . Hypertension   . GERD (gastroesophageal reflux disease)   . Chicken pox   . Sleep apnea    Past Surgical History  Procedure Laterality Date  . Unremarkable.     Family History  Problem Relation Age of Onset  . Hypertension Father   . Depression Father   . Hypertension Mother   . Kidney disease Maternal Grandmother     Dialysis  . Hypertension Maternal Grandmother   . Healthy Brother     x1  . Healthy Sister     x3  . Colon cancer Neg Hx   . Esophageal cancer Neg Hx   . Rectal cancer Neg Hx   . Stomach cancer Neg Hx    History  Substance Use Topics  . Smoking status: Never Smoker   . Smokeless tobacco: Never Used  . Alcohol Use: 0.0 oz/week     Comment: occasional    Review of Systems  Constitutional: Negative for fever and weight loss.  Genitourinary: Negative for difficulty urinating and pelvic pain.  Musculoskeletal: Positive for back pain. Negative for myalgias and neck stiffness.  Neurological: Negative for  tingling, weakness, numbness and headaches.  All other systems reviewed and are negative.     Allergies  Review of patient's allergies indicates no known allergies.  Home Medications   Prior to Admission medications   Medication Sig Start Date End Date Taking? Authorizing Provider  amLODipine (NORVASC) 10 MG tablet Take 1 tablet (10 mg total) by mouth daily. 03/10/14   Jillyn LedgerJessica K Palmer, PA-C  amLODipine (NORVASC) 10 MG tablet Take 1 tablet (10 mg total) by mouth daily. 08/04/14   Nelia Shiobert L Beaton, MD  Ascorbic Acid (VITAMIN C) 100 MG tablet Take 100 mg by mouth daily.    Historical Provider, MD  hydrochlorothiazide (HYDRODIURIL) 25 MG tablet Take 1 tablet (25 mg total) by mouth daily. 03/10/14   Jillyn LedgerJessica K Palmer, PA-C  hydrochlorothiazide (HYDRODIURIL) 25 MG tablet Take 1 tablet (25 mg total) by mouth daily. 08/04/14   Nelia Shiobert L Beaton, MD  HYDROcodone-acetaminophen (HYCET) 7.5-325 mg/15 ml solution Take 15 mLs by mouth 4 (four) times daily as needed for moderate pain. 11/09/13 11/09/14  Rolan BuccoMelanie Belfi, MD  HYDROcodone-acetaminophen (NORCO) 5-325 MG per tablet Take 2 tablets by mouth every 4 (four) hours as needed. 03/06/14   John L Molpus, MD  lisinopril (PRINIVIL,ZESTRIL) 20 MG tablet Take 1 tablet (20 mg total) by mouth daily. 03/10/14   Jillyn LedgerJessica K Palmer, PA-C  lisinopril (PRINIVIL,ZESTRIL) 20 MG tablet Take  1 tablet (20 mg total) by mouth daily. 08/04/14   Nelia Shiobert L Beaton, MD  Multiple Vitamin (MULTIVITAMIN) tablet Take 1 tablet by mouth daily.    Historical Provider, MD  RABEprazole (ACIPHEX) 20 MG tablet Take 1 tablet (20 mg total) by mouth daily. 10/18/13   Louis Meckelobert D Kaplan, MD   BP 167/107 mmHg  Pulse 102  Temp(Src) 98.5 F (36.9 C) (Oral)  Resp 20  Ht 6\' 2"  (1.88 m)  Wt 290 lb (131.543 kg)  BMI 37.22 kg/m2  SpO2 98% Physical Exam  Constitutional: He is oriented to person, place, and time. He appears well-developed and well-nourished. No distress.  HENT:  Head: Normocephalic and  atraumatic.  Mouth/Throat: Oropharynx is clear and moist. No oropharyngeal exudate.  Eyes: Conjunctivae are normal. Pupils are equal, round, and reactive to light.  Neck: Normal range of motion. Neck supple.  Cardiovascular: Normal rate, regular rhythm and intact distal pulses.   Pulmonary/Chest: Effort normal and breath sounds normal. He has no wheezes. He has no rales.  Abdominal: Soft. Bowel sounds are normal. There is no tenderness. There is no rebound and no guarding.  Musculoskeletal: Normal range of motion.  Neurological: He is alert and oriented to person, place, and time. He has normal reflexes.  Intact gait no point tenderness of the t or l psine paraspinal spasm  Skin: Skin is warm and dry.  Psychiatric: He has a normal mood and affect.    ED Course  Procedures (including critical care time) Labs Review Labs Reviewed  URINALYSIS, ROUTINE W REFLEX MICROSCOPIC    Imaging Review No results found.   EKG Interpretation None      MDM   Final diagnoses:  None    Must follow up with PMD for ongoing treatment of HTN.  NSAIds and muscle relaxants for spasm    Quavon Keisling K Alonso Gapinski-Rasch, MD 10/30/14 [phone]

## 2014-10-30 NOTE — ED Notes (Signed)
Denies any difficulty urinating, foul odor or any other symptoms

## 2015-01-04 ENCOUNTER — Encounter (HOSPITAL_BASED_OUTPATIENT_CLINIC_OR_DEPARTMENT_OTHER): Payer: Self-pay

## 2015-01-04 DIAGNOSIS — Z8669 Personal history of other diseases of the nervous system and sense organs: Secondary | ICD-10-CM | POA: Insufficient documentation

## 2015-01-04 DIAGNOSIS — Z8619 Personal history of other infectious and parasitic diseases: Secondary | ICD-10-CM | POA: Insufficient documentation

## 2015-01-04 DIAGNOSIS — M5481 Occipital neuralgia: Secondary | ICD-10-CM | POA: Insufficient documentation

## 2015-01-04 DIAGNOSIS — I10 Essential (primary) hypertension: Secondary | ICD-10-CM | POA: Insufficient documentation

## 2015-01-04 DIAGNOSIS — Z8719 Personal history of other diseases of the digestive system: Secondary | ICD-10-CM | POA: Insufficient documentation

## 2015-01-04 NOTE — ED Notes (Signed)
Pt reports headache with "tingling sensation to back of head," since Wednesday.

## 2015-01-05 ENCOUNTER — Emergency Department (HOSPITAL_BASED_OUTPATIENT_CLINIC_OR_DEPARTMENT_OTHER)
Admission: EM | Admit: 2015-01-05 | Discharge: 2015-01-05 | Disposition: A | Discharge status: Home / Self Care | Payer: Managed Care, Other (non HMO) | Attending: Emergency Medicine | Admitting: Emergency Medicine

## 2015-01-05 DIAGNOSIS — M5481 Occipital neuralgia: Secondary | ICD-10-CM

## 2015-01-05 MED ORDER — HYDROCODONE-ACETAMINOPHEN 5-325 MG PO TABS
1.0000 | ORAL_TABLET | Freq: Once | ORAL | Status: AC
  Administered 2015-01-05: 1 via ORAL
  Filled 2015-01-05: qty 1

## 2015-01-05 MED ORDER — HYDROCODONE-ACETAMINOPHEN 5-325 MG PO TABS: 1.0000 | ORAL_TABLET | Freq: Four times a day (QID) | ORAL | Status: DC | PRN

## 2015-01-05 NOTE — ED Provider Notes (Signed)
CSN: [id number]     Arrival date & time 01/04/15  2347 History   First MD Initiated Contact with Patient 01/05/15 0145     Chief Complaint  Patient presents with  . Headache     (Consider location/radiation/quality/duration/timing/severity/associated sxs/prior Treatment) HPI  This is a 39 year old male with history of hypertension. He has a long-standing history of headaches. These occur intermittently without a known trigger. He describes the headaches as feeling like a pain and tingling in the back of his neck radiating up to the back of his head. They also cause a feeling of fullness in his ears. He is here with a four-day episode that is worse than prior episodes. He states the pain is gotten as high as a 7 out of 10 at its worst. He has had some relief with Tylenol but did not get any relief with Tylenol this evening. The pain is worse when he turns his head to the left or the right but not with palpation. He has had some nausea but no vomiting. He has had no focal neurologic deficits. He states he is compliant with his antihypertensives.  Past Medical History  Diagnosis Date  . Hypertension   . GERD (gastroesophageal reflux disease)   . Chicken pox   . Sleep apnea    Past Surgical History  Procedure Laterality Date  . Unremarkable.     Family History  Problem Relation Age of Onset  . Hypertension Father   . Depression Father   . Hypertension Mother   . Kidney disease Maternal Grandmother     Dialysis  . Hypertension Maternal Grandmother   . Healthy Brother     x1  . Healthy Sister     x3  . Colon cancer Neg Hx   . Esophageal cancer Neg Hx   . Rectal cancer Neg Hx   . Stomach cancer Neg Hx    History  Substance Use Topics  . Smoking status: Never Smoker   . Smokeless tobacco: Never Used  . Alcohol Use: 0.0 oz/week     Comment: occasional    Review of Systems  All other systems reviewed and are negative.   Allergies  Review of patient's allergies indicates no  known allergies.  Home Medications   Prior to Admission medications   Medication Sig Start Date End Date Taking? Authorizing Provider  amLODipine (NORVASC) 10 MG tablet Take 1 tablet (10 mg total) by mouth daily. 03/10/14  Yes Jillyn LedgerJessica K Palmer, PA-C  hydrochlorothiazide (HYDRODIURIL) 25 MG tablet Take 1 tablet (25 mg total) by mouth daily. 03/10/14  Yes Jillyn LedgerJessica K Palmer, PA-C  lisinopril (PRINIVIL,ZESTRIL) 20 MG tablet Take 1 tablet (20 mg total) by mouth daily. 03/10/14  Yes Jessica K Palmer, PA-C   BP 159/94 mmHg  Pulse 98  Temp(Src) 98.5 F (36.9 C) (Oral)  Resp 20  Ht 6\' 1"  (1.854 m)  Wt 290 lb (131.543 kg)  BMI 38.27 kg/m2  SpO2 99%   Physical Exam  General: Well-developed, well-nourished male in no acute distress; appearance consistent with age of record HENT: normocephalic; atraumatic; TMs normal; no scalp tenderness Eyes: pupils equal, round and reactive to light; extraocular muscles intact Neck: supple; rotation to the left or right reproduces the patient's pain; no soft tissue tenderness Heart: regular rate and rhythm Lungs: clear to auscultation bilaterally Abdomen: soft; nondistended Extremities: No deformity; full range of motion Neurologic: Awake, alert and oriented; motor function intact in all extremities and symmetric; no facial droop Skin: Warm and dry  Psychiatric: Normal mood and affect    ED Course  Procedures (including critical care time)    MDM  The patient'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 It does not appear this time that he has a serious condition and we will treat him symptomatically.    Hanley Seamen, MD 01/05/15 [phone]

## 2015-01-05 NOTE — ED Notes (Signed)
[facility]  Pt alert, NAD, calm, interactive, resps e/u, speaking in clear complete sentences. C/o lower posterior HA (occiput). (denies: fever, nv, dizziness, hearing or visual changes), usually take Ibuprofen for HA. [facility]

## 2015-01-05 NOTE — Discharge Instructions (Signed)
Occipital Neuralgia Occipital neuralgia is a type of headache that causes episodes of very bad pain in the back of your head. Pain from occipital neuralgia may spread (radiate) to other parts of your head. The pain is usually brief and often goes away after you rest and relax. These headaches may be caused by irritation of the nerves that leave your spinal cord high up in your neck, just below the base of your skull (occipital nerves). Your occipital nerves transmit sensations from the back of your head, the top of your head, and the areas behind your ears. CAUSES Occipital neuralgia can occur without any known cause (primary headache syndrome). In other cases, occipital neuralgia is caused by pressure on or irritation of one of the two occipital nerves. Causes of occipital nerve compression or irritation include:  Wear and tear of the vertebrae in the neck (osteoarthritis).  Neck injury.  Disease of the disks that separate the vertebrae.  Tumors.  Gout.  Infections.  Diabetes.  Swollen blood vessels that put pressure on the occipital nerves.  Muscle spasm in the neck. SIGNS AND SYMPTOMS Pain is the main symptom of occipital neuralgia. It usually starts in the back of the head but may also be felt in other areas supplied by the occipital nerves. Pain is usually on one side but may be on both sides. You may have:   Brief episodes of very bad pain that is burning, stabbing, shocking, or shooting.  Pain behind the eye.  Pain triggered by neck movement or hair brushing.  Scalp tenderness.  Aching in the back of the head between episodes of very bad pain. DIAGNOSIS  Your health care provider may diagnose occipital neuralgia based on your symptoms and a physical exam. During the exam, the health care provider may push on areas supplied by the occipital nerves to see if they are painful. Some tests may also be done to help in making the diagnosis. These may include:  Imaging studies of  the upper spinal cord, such as an MRI or CT scan. These may show compression or spinal cord abnormalities.  Nerve block. You will get an injection of numbing medicine (local anesthetic) near the occipital nerve to see if this relieves pain. TREATMENT  Treatment may begin with simple measures, such as:   Rest.  Massage.  Heat.  Over-the-counter pain relievers. If these measures do not work, you may need other treatments, including:  Medicines such as:  Prescription-strength anti-inflammatory medicines.  Muscle relaxants.  Antiseizure medicines.  Antidepressants.  Steroid injection. This involves injections of local anesthetic and strong anti-inflammatory drugs (steroids).  Pulsed radiofrequency. Wires are implanted to deliver electrical impulses that block pain signals from the occipital nerve.  Physical therapy.  Surgery to relieve nerve pressure. HOME CARE INSTRUCTIONS  Take all medicines as directed by your health care provider.  Avoid activities that cause pain.  Rest when you have an attack of pain.  Try gentle massage or a heating pad to relieve pain.  Work with a physical therapist to learn stretching exercises you can do at home.  Try a different pillow or sleeping position.  Practice good posture.  Try to stay active. Get regular exercise that does not cause pain. Ask your health care provider to suggest safe exercises for you.  Keep all follow-up visits as directed by your health care provider. This is important. SEEK MEDICAL CARE IF:  Your medicine is not working.  You have new or worsening symptoms. SEEK IMMEDIATE MEDICAL CARE   IF:  You have very bad head pain that is not going away.  You have a sudden change in vision, balance, or speech. MAKE SURE YOU:  Understand these instructions.  Will watch your condition.  Will get help right away if you are not doing well or get worse. Document Released: 11/15/2001 Document Revised: 04/07/2014  Document Reviewed: 11/13/2013 [employer] Patient Information 2015 [employer]. This information is not intended to replace advice given to you by your health care provider. Make sure you discuss any questions you have with your health care provider.  

## 2015-03-25 ENCOUNTER — Emergency Department (HOSPITAL_BASED_OUTPATIENT_CLINIC_OR_DEPARTMENT_OTHER)
Admission: EM | Admit: 2015-03-25 | Discharge: 2015-03-25 | Disposition: A | Discharge status: Home / Self Care | Payer: PRIVATE HEALTH INSURANCE | Attending: Emergency Medicine | Admitting: Emergency Medicine

## 2015-03-25 ENCOUNTER — Encounter (HOSPITAL_BASED_OUTPATIENT_CLINIC_OR_DEPARTMENT_OTHER): Payer: Self-pay

## 2015-03-25 DIAGNOSIS — I10 Essential (primary) hypertension: Secondary | ICD-10-CM | POA: Insufficient documentation

## 2015-03-25 DIAGNOSIS — Z8719 Personal history of other diseases of the digestive system: Secondary | ICD-10-CM | POA: Diagnosis not present

## 2015-03-25 DIAGNOSIS — K029 Dental caries, unspecified: Secondary | ICD-10-CM | POA: Insufficient documentation

## 2015-03-25 DIAGNOSIS — L03211 Cellulitis of face: Secondary | ICD-10-CM | POA: Diagnosis not present

## 2015-03-25 DIAGNOSIS — Z79899 Other long term (current) drug therapy: Secondary | ICD-10-CM | POA: Insufficient documentation

## 2015-03-25 DIAGNOSIS — R220 Localized swelling, mass and lump, head: Secondary | ICD-10-CM | POA: Diagnosis not present

## 2015-03-25 DIAGNOSIS — Z792 Long term (current) use of antibiotics: Secondary | ICD-10-CM | POA: Insufficient documentation

## 2015-03-25 DIAGNOSIS — Z8619 Personal history of other infectious and parasitic diseases: Secondary | ICD-10-CM | POA: Diagnosis not present

## 2015-03-25 MED ORDER — SULFAMETHOXAZOLE-TRIMETHOPRIM 800-160 MG PO TABS: 1.0000 | ORAL_TABLET | Freq: Two times a day (BID) | ORAL | Status: AC

## 2015-03-25 MED ORDER — BUPIVACAINE-EPINEPHRINE (PF) 0.5% -1:200000 IJ SOLN
1.8000 mL | Freq: Once | INTRAMUSCULAR | Status: AC
  Administered 2015-03-25: 1.8 mL
  Filled 2015-03-25: qty 1.8

## 2015-03-25 NOTE — ED Provider Notes (Signed)
CSN: [id number]     Arrival date & time 03/25/15  6213 History   First MD Initiated Contact with Patient 03/25/15 1006     Chief Complaint  Patient presents with  . Cyst     (Consider location/radiation/quality/duration/timing/severity/associated sxs/prior Treatment) HPI Comments: Pt  presents with asymmetric swelling of mainly L side of lower lip since yesterday, now also with submental swelling.  No fevers, chills, trismus, tongue swelling, posterior oropharyngeal swelling or, difficulty breathing.  He's been taking lisinopril for several years.  He reports that he had a small bump on the lip yesterday which she opened it has small amount of blood and pus that drained out.  He is not having dental pain.  He has otherwise been well.  He's had no injuries.    Past Medical History  Diagnosis Date  . Hypertension   . GERD (gastroesophageal reflux disease)   . Chicken pox   . Sleep apnea    Past Surgical History  Procedure Laterality Date  . Unremarkable.     Family History  Problem Relation Age of Onset  . Hypertension Father   . Depression Father   . Hypertension Mother   . Kidney disease Maternal Grandmother     Dialysis  . Hypertension Maternal Grandmother   . Healthy Brother     x1  . Healthy Sister     x3  . Colon cancer Neg Hx   . Esophageal cancer Neg Hx   . Rectal cancer Neg Hx   . Stomach cancer Neg Hx    History  Substance Use Topics  . Smoking status: Never Smoker   . Smokeless tobacco: Never Used  . Alcohol Use: 0.0 oz/week     Comment: occasional    Review of Systems  Constitutional: Negative for fever, activity change, appetite change and fatigue.  HENT: Positive for facial swelling. Negative for congestion, rhinorrhea and trouble swallowing.   Eyes: Negative for photophobia and pain.  Respiratory: Negative for cough, chest tightness and shortness of breath.   Cardiovascular: Negative for chest pain and leg swelling.  Gastrointestinal: Negative for  nausea, vomiting, abdominal pain, diarrhea and constipation.  Endocrine: Negative for polydipsia and polyuria.  Genitourinary: Negative for dysuria, urgency, decreased urine volume and difficulty urinating.  Musculoskeletal: Negative for back pain and gait problem.  Skin: Negative for color change, rash and wound.  Allergic/Immunologic: Negative for immunocompromised state.  Neurological: Negative for dizziness, facial asymmetry, speech difficulty, weakness, numbness and headaches.  Psychiatric/Behavioral: Negative for confusion, decreased concentration and agitation.      Allergies  Review of patient's allergies indicates no known allergies.  Home Medications   Prior to Admission medications   Medication Sig Start Date End Date Taking? Authorizing Provider  amLODipine (NORVASC) 10 MG tablet Take 1 tablet (10 mg total) by mouth daily. 03/10/14   Jillyn Ledger, PA-C  hydrochlorothiazide (HYDRODIURIL) 25 MG tablet Take 1 tablet (25 mg total) by mouth daily. 03/10/14   Jillyn Ledger, PA-C  HYDROcodone-acetaminophen (NORCO/VICODIN) 5-325 MG per tablet Take 1-2 tablets by mouth every 6 (six) hours as needed (for pain). 01/05/15   John Molpus, MD  lisinopril (PRINIVIL,ZESTRIL) 20 MG tablet Take 1 tablet (20 mg total) by mouth daily. 03/10/14   Jillyn Ledger, PA-C  sulfamethoxazole-trimethoprim (BACTRIM DS,SEPTRA DS) 800-160 MG per tablet Take 1 tablet by mouth 2 (two) times daily. 03/25/15 04/01/15  Toy Cookey, MD   BP 143/95 mmHg  Pulse 84  Temp(Src) 98.8 F (37.1 C) (Oral)  Resp 18  Ht 6\' 1"  (1.854 m)  Wt 297 lb (134.718 kg)  BMI 39.19 kg/m2  SpO2 97% Physical Exam  Constitutional: He is oriented to person, place, and time. He appears well-developed and well-nourished. No distress.  HENT:  Head: Normocephalic and atraumatic.    Mouth/Throat: No oral lesions. No trismus in the jaw. Dental caries present. No oropharyngeal exudate.    Generalized poor dentition  Eyes: Pupils  are equal, round, and reactive to light.  Neck: Normal range of motion. Neck supple.  Cardiovascular: Normal rate, regular rhythm and normal heart sounds.  Exam reveals no gallop and no friction rub.   No murmur heard. Pulmonary/Chest: Effort normal and breath sounds normal. No respiratory distress. He has no wheezes. He has no rales.  Abdominal: Soft. Bowel sounds are normal. He exhibits no distension and no mass. There is no tenderness. There is no rebound and no guarding.  Musculoskeletal: Normal range of motion. He exhibits no edema or tenderness.  Neurological: He is alert and oriented to person, place, and time.  Skin: Skin is warm and dry.  Psychiatric: He has a normal mood and affect.    ED Course  Procedures (including critical care time) Labs Review Labs Reviewed - No data to display  Imaging Review No results found.   EKG Interpretation None      MDM   Final diagnoses:  Facial cellulitis  Facial swelling    Pt is a 39 y.o. male with Pmhx as above who presents with asymmetric swelling of mainly L side of lower lip since yesterday, now also with submental swelling.  No fevers, chills, trismus, tongue swelling, posterior oropharyngeal swelling or, difficulty breathing.  He's been taking lisinopril for several years.  He reports that he had a small bump on the lip yesterday which she opened it has small amount of blood and pus that drained out.  He is not having dental pain.  He has otherwise been well.  He's had no injuries. Needle aspiration done w/o purulence returned.  Exam is not consistent with ludwig's angina, I believe submental swelling is likely reactive,  I also doubt angioedema .  Although I will have him hold his lisinopril.  Patient will be placed on a course of Bactrim. .  Strict return precautions given for worsening symptoms.      Shireen Quanavid T Tufaro evaluation in the Emergency Department is complete. It has been determined that no acute conditions requiring  further emergency intervention are present at this time. The patient/guardian have been advised of the diagnosis and plan. We have discussed signs and symptoms that warrant return to the ED, such as changes or worsening in symptoms, , worsening swelling swelling under the tongue, trouble breathing, trouble swallowing, fevers      Toy CookeyMegan Sabah Zucco, MD 03/25/15 1644

## 2015-03-25 NOTE — Discharge Instructions (Signed)

## 2015-03-25 NOTE — ED Notes (Signed)
Pt reports swelling to lower lip yesterday and then this am noticed a knot under his chin that is hard.  Reports knot is mildy tender.  Reports tightness.

## 2015-04-03 ENCOUNTER — Emergency Department (HOSPITAL_BASED_OUTPATIENT_CLINIC_OR_DEPARTMENT_OTHER): Payer: PRIVATE HEALTH INSURANCE

## 2015-04-03 ENCOUNTER — Encounter (HOSPITAL_BASED_OUTPATIENT_CLINIC_OR_DEPARTMENT_OTHER): Payer: Self-pay

## 2015-04-03 ENCOUNTER — Emergency Department (HOSPITAL_BASED_OUTPATIENT_CLINIC_OR_DEPARTMENT_OTHER)
Admission: EM | Admit: 2015-04-03 | Discharge: 2015-04-04 | Disposition: A | Discharge status: Home / Self Care | Payer: PRIVATE HEALTH INSURANCE | Attending: Emergency Medicine | Admitting: Emergency Medicine

## 2015-04-03 DIAGNOSIS — Z760 Encounter for issue of repeat prescription: Secondary | ICD-10-CM | POA: Diagnosis not present

## 2015-04-03 DIAGNOSIS — Z8619 Personal history of other infectious and parasitic diseases: Secondary | ICD-10-CM | POA: Diagnosis not present

## 2015-04-03 DIAGNOSIS — R600 Localized edema: Secondary | ICD-10-CM | POA: Diagnosis not present

## 2015-04-03 DIAGNOSIS — Z79899 Other long term (current) drug therapy: Secondary | ICD-10-CM | POA: Diagnosis not present

## 2015-04-03 DIAGNOSIS — Z8669 Personal history of other diseases of the nervous system and sense organs: Secondary | ICD-10-CM | POA: Diagnosis not present

## 2015-04-03 DIAGNOSIS — I10 Essential (primary) hypertension: Secondary | ICD-10-CM | POA: Insufficient documentation

## 2015-04-03 DIAGNOSIS — Z8719 Personal history of other diseases of the digestive system: Secondary | ICD-10-CM | POA: Diagnosis not present

## 2015-04-03 DIAGNOSIS — R609 Edema, unspecified: Secondary | ICD-10-CM

## 2015-04-03 MED ORDER — LISINOPRIL 20 MG PO TABS: 20.0000 mg | ORAL_TABLET | Freq: Every day | ORAL | Status: DC

## 2015-04-03 MED ORDER — LISINOPRIL 10 MG PO TABS
20.0000 mg | ORAL_TABLET | Freq: Once | ORAL | Status: AC
  Administered 2015-04-03: 20 mg via ORAL
  Filled 2015-04-03: qty 2

## 2015-04-03 MED ORDER — HYDROCHLOROTHIAZIDE 25 MG PO TABS
25.0000 mg | ORAL_TABLET | Freq: Once | ORAL | Status: AC
  Administered 2015-04-03: 25 mg via ORAL
  Filled 2015-04-03: qty 1

## 2015-04-03 MED ORDER — HYDROCHLOROTHIAZIDE 25 MG PO TABS: 25.0000 mg | ORAL_TABLET | Freq: Every day | ORAL | Status: DC

## 2015-04-03 NOTE — ED Notes (Signed)
Pt reports ran out of lisinopril, hctz, and amlodipin x 1 month.   Pt reports bp has been high.   Also reports bilateral leg and ankle swelling.

## 2015-04-03 NOTE — Discharge Instructions (Signed)
Hypertension °Hypertension, commonly called high blood pressure, is when the force of blood pumping through your arteries is too strong. Your arteries are the blood vessels that carry blood from your heart throughout your body. A blood pressure reading consists of a higher number over a lower number, such as 110/72. The higher number (systolic) is the pressure inside your arteries when your heart pumps. The lower number (diastolic) is the pressure inside your arteries when your heart relaxes. Ideally you want your blood pressure below 120/80. °Hypertension forces your heart to work harder to pump blood. Your arteries may become narrow or stiff. Having hypertension puts you at risk for heart disease, stroke, and other problems.  °RISK FACTORS °Some risk factors for high blood pressure are controllable. Others are not.  °Risk factors you cannot control include:  °· Race. You may be at higher risk if you are African American. °· Age. Risk increases with age. °· Gender. Men are at higher risk than women before age 45 years. After age 65, women are at higher risk than men. °Risk factors you can control include: °· Not getting enough exercise or physical activity. °· Being overweight. °· Getting too much fat, sugar, calories, or salt in your diet. °· Drinking too much alcohol. °SIGNS AND SYMPTOMS °Hypertension does not usually cause signs or symptoms. Extremely high blood pressure (hypertensive crisis) may cause headache, anxiety, shortness of breath, and nosebleed. °DIAGNOSIS  °To check if you have hypertension, your health care provider will measure your blood pressure while you are seated, with your arm held at the level of your heart. It should be measured at least twice using the same arm. Certain conditions can cause a difference in blood pressure between your right and left arms. A blood pressure reading that is higher than normal on one occasion does not mean that you need treatment. If one blood pressure reading  is high, ask your health care provider about having it checked again. °TREATMENT  °Treating high blood pressure includes making lifestyle changes and possibly taking medicine. Living a healthy lifestyle can help lower high blood pressure. You may need to change some of your habits. °Lifestyle changes may include: °· Following the DASH diet. This diet is high in fruits, vegetables, and whole grains. It is low in salt, red meat, and added sugars. °· Getting at least 2½ hours of brisk physical activity every week. °· Losing weight if necessary. °· Not smoking. °· Limiting alcoholic beverages. °· Learning ways to reduce stress. ° If lifestyle changes are not enough to get your blood pressure under control, your health care provider may prescribe medicine. You may need to take more than one. Work closely with your health care provider to understand the risks and benefits. °HOME CARE INSTRUCTIONS °· Have your blood pressure rechecked as directed by your health care provider.   °· Take medicines only as directed by your health care provider. Follow the directions carefully. Blood pressure medicines must be taken as prescribed. The medicine does not work as well when you skip doses. Skipping doses also puts you at risk for problems.   °· Do not smoke.   °· Monitor your blood pressure at home as directed by your health care provider.  °SEEK MEDICAL CARE IF:  °· You think you are having a reaction to medicines taken. °· You have recurrent headaches or feel dizzy. °· You have swelling in your ankles. °· You have trouble with your vision. °SEEK IMMEDIATE MEDICAL CARE IF: °· You develop a severe headache or confusion. °·   You have unusual weakness, numbness, or feel faint.  You have severe chest or abdominal pain.  You vomit repeatedly.  You have trouble breathing. MAKE SURE YOU:   Understand these instructions.  Will watch your condition.  Will get help right away if you are not doing well or get worse. Document  Released: 11/21/2005 Document Revised: 04/07/2014 Document Reviewed: 09/13/2013 [facility] Patient Information 2015 [place], [place]. This information is not intended to replace advice given to you by your health care provider. Make sure you discuss any questions you have with your health care provider. Edema Edema is an abnormal buildup of fluids in your bodytissues. Edema is somewhatdependent on gravity to pull the fluid to the lowest place in your body. That makes the condition more common in the legs and thighs (lower extremities). Painless swelling of the feet and ankles is common and becomes more likely as you get older. It is also common in looser tissues, like around your eyes.  When the affected area is squeezed, the fluid may move out of that spot and leave a dent for a few moments. This dent is called pitting.  CAUSES  There are many possible causes of edema. Eating too much salt and being on your feet or sitting for a long time can cause edema in your legs and ankles. Hot weather may make edema worse. Common medical causes of edema include:  Heart failure.  Liver disease.  Kidney disease.  Weak blood vessels in your legs.  Cancer.  An injury.  Pregnancy.  Some medications.  Obesity. SYMPTOMS  Edema is usually painless.Your skin may look swollen or shiny.  DIAGNOSIS  Your health care provider may be able to diagnose edema by asking about your medical history and doing a physical exam. You may need to have tests such as X-rays, an electrocardiogram, or blood tests to check for medical conditions that may cause edema.  TREATMENT  Edema treatment depends on the cause. If you have heart, liver, or kidney disease, you need the treatment appropriate for these conditions. General treatment may include:  Elevation of the affected body part above the level of your heart.  Compression of the affected body part. Pressure from elastic bandages or support stockings squeezes the  tissues and forces fluid back into the blood vessels. This keeps fluid from entering the tissues.  Restriction of fluid and salt intake.  Use of a water pill (diuretic). These medications are appropriate only for some types of edema. They pull fluid out of your body and make you urinate more often. This gets rid of fluid and reduces swelling, but diuretics can have side effects. Only use diuretics as directed by your health care provider. HOME CARE INSTRUCTIONS   Keep the affected body part above the level of your heart when you are lying down.   Do not sit still or stand for prolonged periods.   Do not put anything directly under your knees when lying down.  Do not wear constricting clothing or garters on your upper legs.   Exercise your legs to work the fluid back into your blood vessels. This may help the swelling go down.   Wear elastic bandages or support stockings to reduce ankle swelling as directed by your health care provider.   Eat a low-salt diet to reduce fluid if your health care provider recommends it.   Only take medicines as directed by your health care provider. SEEK MEDICAL CARE IF:   Your edema is not responding to treatment.  You have heart, liver, or kidney disease and notice symptoms of edema.  You have edema in your legs that does not improve after elevating them.   You have sudden and unexplained weight gain. SEEK IMMEDIATE MEDICAL CARE IF:   You develop shortness of breath or chest pain.   You cannot breathe when you lie down.  You develop pain, redness, or warmth in the swollen areas.   You have heart, liver, or kidney disease and suddenly get edema.  You have a fever and your symptoms suddenly get worse. MAKE SURE YOU:   Understand these instructions.  Will watch your condition.  Will get help right away if you are not doing well or get worse. Document Released: 11/21/2005 Document Revised: 04/07/2014 Document Reviewed:  09/13/2013 [facility] Patient Information 2015 [place], [place]. This information is not intended to replace advice given to you by your health care provider. Make sure you discuss any questions you have with your health care provider.

## 2015-04-03 NOTE — ED Notes (Signed)
Patient transported to Ultrasound 

## 2015-04-03 NOTE — ED Provider Notes (Signed)
CSN: [id number]     Arrival date & time 04/03/15  1910 History  This chart was scribed for Arby BarretteMarcy Erian Lariviere, MD [id number] Robert PrestoKara Cruz, ED Scribe. This patient was seen in room MH04/MH04 and the patient's care was started at 9:36 PM.    Chief Complaint  Patient presents with  . Hypertension     Patient is a 39 y.o. male presenting with hypertension. The history is provided by the patient. No language interpreter was used.  Hypertension   HPI Comments: Robert Cruz is a 39 y.o. male with PMHx of HTN, GERD, and sleep apnea who presents to the Emergency Department complaining of elevated blood pressure with onset 1 month ago. Pt notes associated intermittent headache and dizziness. Pt has been out of refills for his lisinopril, HCTZ, and amlodipine for a month. Pt has recently been accepted by a new PCP and has an appointment scheduled on 06/14. Pt also reports pain, redness and swelling in bilateral LEs with onset 1 month ago, worsening in the last 2 days. Pt states standing for prolonged periods (which he has to do for work) and bending his legs aggravates to pain. Pt is not a smoker. Pt denies chest pain, SOB,and  urinary changes.    Past Medical History  Diagnosis Date  . Hypertension   . GERD (gastroesophageal reflux disease)   . Chicken pox   . Sleep apnea    Past Surgical History  Procedure Laterality Date  . Unremarkable.     Family History  Problem Relation Age of Onset  . Hypertension Father   . Depression Father   . Hypertension Mother   . Kidney disease Maternal Grandmother     Dialysis  . Hypertension Maternal Grandmother   . Healthy Brother     x1  . Healthy Sister     x3  . Colon cancer Neg Hx   . Esophageal cancer Neg Hx   . Rectal cancer Neg Hx   . Stomach cancer Neg Hx    History  Substance Use Topics  . Smoking status: Never Smoker   . Smokeless tobacco: Never Used  . Alcohol Use: 0.0 oz/week     Comment: occasional    Review of Systems 10 Systems  reviewed and all are negative for acute change except as noted in the HPI.    Allergies  Review of patient's allergies indicates no known allergies.  Home Medications   Prior to Admission medications   Medication Sig Start Date End Date Taking? Authorizing Provider  amLODipine (NORVASC) 10 MG tablet Take 1 tablet (10 mg total) by mouth daily. 03/10/14   Jillyn LedgerJessica K Palmer, PA-C  hydrochlorothiazide (HYDRODIURIL) 25 MG tablet Take 1 tablet (25 mg total) by mouth daily. 03/10/14   Jillyn LedgerJessica K Palmer, PA-C  hydrochlorothiazide (HYDRODIURIL) 25 MG tablet Take 1 tablet (25 mg total) by mouth daily. 04/03/15   Arby BarretteMarcy Omunique Pederson, MD  HYDROcodone-acetaminophen (NORCO/VICODIN) 5-325 MG per tablet Take 1-2 tablets by mouth every 6 (six) hours as needed (for pain). 01/05/15   John Molpus, MD  lisinopril (PRINIVIL,ZESTRIL) 20 MG tablet Take 1 tablet (20 mg total) by mouth daily. 03/10/14   Jillyn LedgerJessica K Palmer, PA-C  lisinopril (PRINIVIL,ZESTRIL) 20 MG tablet Take 1 tablet (20 mg total) by mouth daily. 04/03/15   Arby BarretteMarcy Aubrynn Katona, MD   BP 156/103 mmHg  Pulse 80  Temp(Src) 99.1 F (37.3 C) (Oral)  Resp 18  Ht 6\' 2"  (1.88 m)  Wt 296 lb (134.265 kg)  BMI 37.99 kg/m2  SpO2 98% Physical Exam  Constitutional: He is oriented to person, place, and time.  The patient is obese. He is nontoxic and alert. No respiratory distress. Mental status is clear.  HENT:  Head: Normocephalic and atraumatic.  Nose: Nose normal.  Mouth/Throat: Oropharynx is clear and moist.  Eyes: EOM are normal. Pupils are equal, round, and reactive to light.  Neck: Neck supple.  Cardiovascular: Normal rate, regular rhythm, normal heart sounds and intact distal pulses.   Pulmonary/Chest: Effort normal and breath sounds normal.  Abdominal: Soft. Bowel sounds are normal. He exhibits no distension. There is no tenderness.  Musculoskeletal: Normal range of motion. He exhibits edema. He exhibits no tenderness.  Patient has 2+ pitting edema bilateral lower  extremities. Mild diffuse erythema bilaterally. Dorsalis pulses pulses are 2+ and symmetric.  Neurological: He is alert and oriented to person, place, and time. He has normal strength. Coordination normal. GCS eye subscore is 4. GCS verbal subscore is 5. GCS motor subscore is 6.  Skin: Skin is warm, dry and intact.  Psychiatric: He has a normal mood and affect.    ED Course  Procedures (including critical care time) DIAGNOSTIC STUDIES: Oxygen Saturation is 98% on room air, normal by my interpretation.    COORDINATION OF CARE: 9:46 PM Discussed treatment plan with patient at beside, the patient agrees with the plan and has no further questions at this time.   Labs Review Labs Reviewed - No data to display  Imaging Review US Venous Img Lower Bilateral  04/04/2015   CLINICAL DATA:  Bilateral leg swelling and pain about the knee to ankle for 1.5 months.  EXAM: BILATERAL LOWER EXTREMITY VENOUS DOPPLER ULTRASOUND  TECHNIQUE: Gray-scale sonography with graded compression, as well as color Doppler and duplex ultrasound were performed to evaluate the lower extremity deep venous systems from the level of the common femoral vein and including the common femoral, femoral, profunda femoral, popliteal and calf veins including the posterior tibial, peroneal and gastrocnemius veins when visible. The superficial great saphenous vein was also interrogated. Spectral Doppler was utilized to evaluate flow at rest and with distal augmentation maneuvers in the common femoral, femoral and popliteal veins.  COMPARISON:  None.  FINDINGS: RIGHT LOWER EXTREMITY  Common Femoral Vein: No evidence of thrombus. Normal compressibility, respiratory phasicity and response to augmentation.  Saphenofemoral Junction: No evidence of thrombus. Normal compressibility and flow on color Doppler imaging.  Profunda Femoral Vein: No evidence of thrombus. Normal compressibility and flow on color Doppler imaging.  Femoral Vein: No evidence of  thrombus. Normal compressibility, respiratory phasicity and response to augmentation.  Popliteal Vein: No evidence of thrombus. Normal compressibility, respiratory phasicity and response to augmentation.  Calf Veins: No evidence of thrombus. Normal compressibility and flow on color Doppler imaging.  Superficial Great Saphenous Vein: No evidence of thrombus. Normal compressibility and flow on color Doppler imaging.  Venous Reflux:  None.  Other Findings:  Soft tissue swelling about the ankles.  LEFT LOWER EXTREMITY  Common Femoral Vein: No evidence of thrombus. Normal compressibility, respiratory phasicity and response to augmentation.  Saphenofemoral Junction: No evidence of thrombus. Normal compressibility and flow on color Doppler imaging.  Profunda Femoral Vein: No evidence of thrombus. Normal compressibility and flow on color Doppler imaging.  Femoral Vein: No evidence of thrombus. Normal compressibility, respiratory phasicity and response to augmentation.  Popliteal Vein: No evidence of thrombus. Normal compressibility, respiratory phasicity and response to augmentation.  Calf Veins: No evidence of thrombus. Normal compressibility and flow on color  Doppler imaging.  Superficial Great Saphenous Vein: No evidence of thrombus. Normal compressibility and flow on color Doppler imaging.  Venous Reflux:  None.  Other Findings:  Soft tissue swelling about the ankles.  IMPRESSION: No evidence of deep venous thrombosis.   Electronically Signed   By: Jeronimo Greaves M.D.   On: 04/04/2015 00:00     EKG Interpretation None     Meds ordered this encounter  Medications  . lisinopril (PRINIVIL,ZESTRIL) tablet 20 mg    Sig:   . hydrochlorothiazide (HYDRODIURIL) tablet 25 mg    Sig:   . lisinopril (PRINIVIL,ZESTRIL) 20 MG tablet    Sig: Take 1 tablet (20 mg total) by mouth daily.    Dispense:  30 tablet    Refill:  1  . hydrochlorothiazide (HYDRODIURIL) 25 MG tablet    Sig: Take 1 tablet (25 mg total) by mouth  daily.    Dispense:  30 tablet    Refill:  1     MDM   Final diagnoses:  Essential hypertension  Medication refill  Peripheral edema   The patient presents with untreated hypertension for over a month. He does state he needs refills until he can be seen by a family doctor that is scheduled beginning of June. He presented also with bilateral lower extremity swelling. At this point DVT is ruled out and swelling is most likely secondary to chronic venous stasis. Patient is counseled on the treatment and management of this.    Arby Barrette, MD 04/04/15 [phone]

## 2015-04-11 ENCOUNTER — Emergency Department (INDEPENDENT_AMBULATORY_CARE_PROVIDER_SITE_OTHER)
Admission: EM | Admit: 2015-04-11 | Discharge: 2015-04-11 | Disposition: A | Discharge status: Home / Self Care | Payer: PRIVATE HEALTH INSURANCE | Source: Home / Self Care | Attending: Family Medicine | Admitting: Family Medicine

## 2015-04-11 ENCOUNTER — Encounter (HOSPITAL_COMMUNITY): Payer: Self-pay

## 2015-04-11 DIAGNOSIS — I10 Essential (primary) hypertension: Secondary | ICD-10-CM

## 2015-04-11 MED ORDER — AMLODIPINE BESYLATE 10 MG PO TABS: 10.0000 mg | ORAL_TABLET | Freq: Every day | ORAL | Status: DC

## 2015-04-11 MED ORDER — METHYLPREDNISOLONE ACETATE 80 MG/ML IJ SUSP: 80.0000 mg | Freq: Once | INTRAMUSCULAR | Status: DC

## 2015-04-11 MED ORDER — LISINOPRIL 20 MG PO TABS: 20.0000 mg | ORAL_TABLET | Freq: Every day | ORAL | Status: DC

## 2015-04-11 NOTE — Discharge Instructions (Signed)

## 2015-04-11 NOTE — ED Notes (Signed)
Requesting refill of BP medication

## 2015-04-11 NOTE — ED Provider Notes (Addendum)
CSN: [id number]     Arrival date & time 04/11/15  [id number] History   First MD Initiated Contact with Patient 04/11/15 [phone]     Chief Complaint  Patient presents with  . 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  No heart disease, kidney disease, or eye disease. Patient has dull headache since he ran out of med two weeks ago, which he has experienced when he ran out of medicine before.  He has an appt with Dr. Jackquline DenmarkVieta Bland in two weeks.  The history is provided by the patient.    Past Medical History  Diagnosis Date  . Hypertension   . GERD (gastroesophageal reflux disease)   . Chicken pox   . Sleep apnea    Past Surgical History  Procedure Laterality Date  . Unremarkable.     Family History  Problem Relation Age of Onset  . Hypertension Father   . Depression Father   . Hypertension Mother   . Kidney disease Maternal Grandmother     Dialysis  . Hypertension Maternal Grandmother   . Healthy Brother     x1  . Healthy Sister     x3  . Colon cancer Neg Hx   . Esophageal cancer Neg Hx   . Rectal cancer Neg Hx   . Stomach cancer Neg Hx    History  Substance Use Topics  . Smoking status: Never Smoker   . Smokeless tobacco: Never Used  . Alcohol Use: 0.0 oz/week     Comment: occasional    Review of Systems  Constitutional: Negative.   Eyes: Negative.   Respiratory: Negative.   Cardiovascular: Negative.   Gastrointestinal: Negative.   Genitourinary: Negative.   Musculoskeletal: Negative.   Skin: Negative.   Neurological: Positive for headaches. Negative for light-headedness and numbness.    Allergies  Review of patient's allergies indicates no known allergies.  Home Medications   Prior to Admission medications   Medication Sig Start Date End Date Taking? Authorizing Provider  amLODipine (NORVASC) 10 MG tablet Take 1 tablet (10 mg total) by mouth daily. 03/10/14  Yes Jillyn LedgerJessica K Palmer, PA-C  lisinopril  (PRINIVIL,ZESTRIL) 20 MG tablet Take 1 tablet (20 mg total) by mouth daily. 04/03/15  Yes Arby BarretteMarcy Pfeiffer, MD  hydrochlorothiazide (HYDRODIURIL) 25 MG tablet Take 1 tablet (25 mg total) by mouth daily. 03/10/14   Jillyn LedgerJessica K Palmer, PA-C  hydrochlorothiazide (HYDRODIURIL) 25 MG tablet Take 1 tablet (25 mg total) by mouth daily. 04/03/15   Arby BarretteMarcy Pfeiffer, MD  HYDROcodone-acetaminophen (NORCO/VICODIN) 5-325 MG per tablet Take 1-2 tablets by mouth every 6 (six) hours as needed (for pain). 01/05/15   John Molpus, MD  lisinopril (PRINIVIL,ZESTRIL) 20 MG tablet Take 1 tablet (20 mg total) by mouth daily. 03/10/14   Janene HarveyJessica K Palmer, PA-C   BP 160/110 mmHg  Pulse 80  Temp(Src) 97.6 F (36.4 C) (Oral)  Resp 16 Physical Exam  Constitutional: He appears well-developed and well-nourished.  HENT:  Head: Normocephalic and atraumatic.  Eyes: Pupils are equal, round, and reactive to light.  Neck: Normal range of motion.  Cardiovascular: Normal rate, regular rhythm and normal heart sounds.   Pulmonary/Chest: Effort normal and breath sounds normal.  Musculoskeletal: Normal range of motion.  Neurological: He is alert.  Skin: Skin is warm and dry.  Vitals reviewed.   ED Course  Procedures (including critical care time) Labs Review Labs Reviewed - No data to display  Imaging  Review No results found.   MDM  Hypertension, essential     Elvina SidleKurt Nusayba Cadenas, MD 04/11/15 [phone]  Elvina SidleKurt Royston Bekele, MD 04/11/15 [phone]

## 2015-05-28 ENCOUNTER — Telehealth: Payer: Self-pay | Admitting: Internal Medicine

## 2015-05-28 NOTE — Telephone Encounter (Signed)
Dr Yetta Barre,  This patient established @ [facility] in 2014. He was only seen once before loosing his insurance. He has gained insurance once more. His fiance is currently a patient of yours (tarra woodard). He is requesting to be taken under your care. Are you willing to accept him as a patient? Thanks.

## 2015-05-30 NOTE — Telephone Encounter (Signed)
Yes, ok with me 

## 2015-06-02 NOTE — Telephone Encounter (Signed)
Called Robert Cruz and advised. She is going to have patient call and schedule.

## 2015-06-11 ENCOUNTER — Encounter (HOSPITAL_COMMUNITY): Payer: Self-pay | Admitting: Emergency Medicine

## 2015-06-11 ENCOUNTER — Emergency Department (INDEPENDENT_AMBULATORY_CARE_PROVIDER_SITE_OTHER)
Admission: EM | Admit: 2015-06-11 | Discharge: 2015-06-11 | Disposition: A | Discharge status: Home / Self Care | Payer: PRIVATE HEALTH INSURANCE | Source: Home / Self Care | Attending: Family Medicine | Admitting: Family Medicine

## 2015-06-11 DIAGNOSIS — I10 Essential (primary) hypertension: Secondary | ICD-10-CM

## 2015-06-11 LAB — POCT I-STAT, CHEM 8
BUN: 9 mg/dL (ref 6–20)
Calcium, Ion: 1.18 mmol/L (ref 1.12–1.23)
Chloride: 103 mmol/L (ref 101–111)
Creatinine, Ser: 1 mg/dL (ref 0.61–1.24)
Glucose, Bld: 88 mg/dL (ref 65–99)
HEMATOCRIT: 44 % (ref 39.0–52.0)
HEMOGLOBIN: 15 g/dL (ref 13.0–17.0)
Potassium: 4.3 mmol/L (ref 3.5–5.1)
SODIUM: 141 mmol/L (ref 135–145)
TCO2: 27 mmol/L (ref 0–100)

## 2015-06-11 MED ORDER — AMLODIPINE BESYLATE 10 MG PO TABS: 10.0000 mg | ORAL_TABLET | Freq: Every day | ORAL | Status: DC

## 2015-06-11 MED ORDER — LISINOPRIL-HYDROCHLOROTHIAZIDE 20-25 MG PO TABS: 1.0000 | ORAL_TABLET | Freq: Every day | ORAL | Status: DC

## 2015-06-11 NOTE — Discharge Instructions (Signed)
Take medicines as prescribed and see your doctor for further refills and monitoring of blood pressure

## 2015-06-11 NOTE — ED Provider Notes (Signed)
CSN: [id number]     Arrival date & time 06/11/15  1303 History   First MD Initiated Contact with Patient 06/11/15 1327     Chief Complaint  Patient presents with  . Medication Refill  . Headache   (Consider location/radiation/quality/duration/timing/severity/associated sxs/prior Treatment) Patient is a 39 y.o. male presenting with hypertension. The history is provided by the patient.  Hypertension This is a chronic problem. The current episode started 2 days ago. The problem has been gradually worsening. Associated symptoms include headaches. Pertinent negatives include no chest pain, no abdominal pain and no shortness of breath. Associated symptoms comments: Ran out of bp meds, sx related to same, no cp or sob.Marland Kitchen.    Past Medical History  Diagnosis Date  . Hypertension   . GERD (gastroesophageal reflux disease)   . Chicken pox   . Sleep apnea    Past Surgical History  Procedure Laterality Date  . Unremarkable.     Family History  Problem Relation Age of Onset  . Hypertension Father   . Depression Father   . Hypertension Mother   . Kidney disease Maternal Grandmother     Dialysis  . Hypertension Maternal Grandmother   . Healthy Brother     x1  . Healthy Sister     x3  . Colon cancer Neg Hx   . Esophageal cancer Neg Hx   . Rectal cancer Neg Hx   . Stomach cancer Neg Hx    History  Substance Use Topics  . Smoking status: Never Smoker   . Smokeless tobacco: Never Used  . Alcohol Use: 0.0 oz/week     Comment: occasional    Review of Systems  Constitutional: Negative.   Respiratory: Negative for chest tightness and shortness of breath.   Cardiovascular: Negative for chest pain, palpitations and leg swelling.  Gastrointestinal: Negative.  Negative for abdominal pain.  Neurological: Positive for headaches.    Allergies  Review of patient's allergies indicates no known allergies.  Home Medications   Prior to Admission medications   Medication Sig Start Date End  Date Taking? Authorizing Provider  lisinopril (PRINIVIL,ZESTRIL) 20 MG tablet Take 1 tablet (20 mg total) by mouth daily. 04/11/15  Yes Elvina SidleKurt Lauenstein, MD  amLODipine (NORVASC) 10 MG tablet Take 1 tablet (10 mg total) by mouth daily. 06/11/15   Linna HoffJames D Donata Reddick, MD  lisinopril-hydrochlorothiazide (PRINZIDE,ZESTORETIC) 20-25 MG per tablet Take 1 tablet by mouth daily. 06/11/15   Linna HoffJames D Harmoney Sienkiewicz, MD   BP 153/106 mmHg  Pulse 92  Temp(Src) 99.2 F (37.3 C) (Oral)  Resp 18  SpO2 95% Physical Exam  Constitutional: He is oriented to person, place, and time. He appears well-developed and well-nourished. No distress.  HENT:  Mouth/Throat: Oropharynx is clear and moist.  Eyes: Conjunctivae are normal. Pupils are equal, round, and reactive to light.  Neck: Normal range of motion. Neck supple.  Cardiovascular: Regular rhythm and normal heart sounds.   Pulmonary/Chest: Effort normal and breath sounds normal.  Musculoskeletal: He exhibits no edema.  Lymphadenopathy:    He has no cervical adenopathy.  Neurological: He is alert and oriented to person, place, and time.  Skin: Skin is warm and dry.  Nursing note and vitals reviewed.   ED Course  Procedures (including critical care time) Labs Review Labs Reviewed  POCT I-STAT, CHEM 8   i-stat wnl. Imaging Review No results found.   MDM   1. Essential hypertension        Linna HoffJames D Mackie Holness, MD 06/11/15 [phone]

## 2015-06-11 NOTE — ED Notes (Signed)
Pt comes in requesting medication refill on BP medications States PCP cant see him until next month C/o generalized headache with BP 153/101

## 2015-09-05 ENCOUNTER — Emergency Department (INDEPENDENT_AMBULATORY_CARE_PROVIDER_SITE_OTHER)
Admission: EM | Admit: 2015-09-05 | Discharge: 2015-09-05 | Disposition: A | Discharge status: Home / Self Care | Payer: PRIVATE HEALTH INSURANCE | Source: Home / Self Care | Attending: Family Medicine | Admitting: Family Medicine

## 2015-09-05 ENCOUNTER — Encounter (HOSPITAL_COMMUNITY): Payer: Self-pay | Admitting: Emergency Medicine

## 2015-09-05 DIAGNOSIS — R609 Edema, unspecified: Secondary | ICD-10-CM | POA: Diagnosis not present

## 2015-09-05 DIAGNOSIS — Z760 Encounter for issue of repeat prescription: Secondary | ICD-10-CM | POA: Diagnosis not present

## 2015-09-05 DIAGNOSIS — I10 Essential (primary) hypertension: Secondary | ICD-10-CM | POA: Diagnosis not present

## 2015-09-05 LAB — POCT I-STAT, CHEM 8
BUN: 13 mg/dL (ref 6–20)
CREATININE: 1 mg/dL (ref 0.61–1.24)
Calcium, Ion: 1.15 mmol/L (ref 1.12–1.23)
Chloride: 101 mmol/L (ref 101–111)
Glucose, Bld: 90 mg/dL (ref 65–99)
HEMATOCRIT: 45 % (ref 39.0–52.0)
Hemoglobin: 15.3 g/dL (ref 13.0–17.0)
POTASSIUM: 4.8 mmol/L (ref 3.5–5.1)
Sodium: 140 mmol/L (ref 135–145)
TCO2: 30 mmol/L (ref 0–100)

## 2015-09-05 MED ORDER — LISINOPRIL-HYDROCHLOROTHIAZIDE 20-25 MG PO TABS: 1.0000 | ORAL_TABLET | Freq: Every day | ORAL | Status: DC

## 2015-09-05 MED ORDER — AMLODIPINE BESYLATE 10 MG PO TABS: 10.0000 mg | ORAL_TABLET | Freq: Every day | ORAL | Status: DC

## 2015-09-05 NOTE — Discharge Instructions (Signed)
Edema °Edema is an abnormal buildup of fluids in your body tissues. Edema is somewhat dependent on gravity to pull the fluid to the lowest place in your body. That makes the condition more common in the legs and thighs (lower extremities). Painless swelling of the feet and ankles is common and becomes more likely as you get older. It is also common in looser tissues, like around your eyes.  °When the affected area is squeezed, the fluid may move out of that spot and leave a dent for a few moments. This dent is called pitting.  °CAUSES  °There are many possible causes of edema. Eating too much salt and being on your feet or sitting for a long time can cause edema in your legs and ankles. Hot weather may make edema worse. Common medical causes of edema include: °· Heart failure. °· Liver disease. °· Kidney disease. °· Weak blood vessels in your legs. °· Cancer. °· An injury. °· Pregnancy. °· Some medications. °· Obesity.  °SYMPTOMS  °Edema is usually painless. Your skin may look swollen or shiny.  °DIAGNOSIS  °Your health care provider may be able to diagnose edema by asking about your medical history and doing a physical exam. You may need to have tests such as X-rays, an electrocardiogram, or blood tests to check for medical conditions that may cause edema.  °TREATMENT  °Edema treatment depends on the cause. If you have heart, liver, or kidney disease, you need the treatment appropriate for these conditions. General treatment may include: °· Elevation of the affected body part above the level of your heart. °· Compression of the affected body part. Pressure from elastic bandages or support stockings squeezes the tissues and forces fluid back into the blood vessels. This keeps fluid from entering the tissues. °· Restriction of fluid and salt intake. °· Use of a water pill (diuretic). These medications are appropriate only for some types of edema. They pull fluid out of your body and make you urinate more often. This  gets rid of fluid and reduces swelling, but diuretics can have side effects. Only use diuretics as directed by your health care provider. °HOME CARE INSTRUCTIONS  °· Keep the affected body part above the level of your heart when you are lying down.   °· Do not sit still or stand for prolonged periods.   °· Do not put anything directly under your knees when lying down. °· Do not wear constricting clothing or garters on your upper legs.   °· Exercise your legs to work the fluid back into your blood vessels. This may help the swelling go down.   °· Wear elastic bandages or support stockings to reduce ankle swelling as directed by your health care provider.   °· Eat a low-salt diet to reduce fluid if your health care provider recommends it.   °· Only take medicines as directed by your health care provider.  °SEEK MEDICAL CARE IF:  °· Your edema is not responding to treatment. °· You have heart, liver, or kidney disease and notice symptoms of edema. °· You have edema in your legs that does not improve after elevating them.   °· You have sudden and unexplained weight gain. °SEEK IMMEDIATE MEDICAL CARE IF:  °· You develop shortness of breath or chest pain.   °· You cannot breathe when you lie down. °· You develop pain, redness, or warmth in the swollen areas.   °· You have heart, liver, or kidney disease and suddenly get edema. °· You have a fever and your symptoms suddenly get worse. °MAKE SURE YOU:  °·   Understand these instructions.  Will watch your condition.  Will get help right away if you are not doing well or get worse. Document Released: 11/21/2005 Document Revised: 04/07/2014 Document Reviewed: 09/13/2013 [facility] Patient Information 2015 [place], [place]. This information is not intended to replace advice given to you by your health care provider. Make sure you discuss any questions you have with your health care provider.  Hypertension Hypertension, commonly called high blood pressure, is when the force  of blood pumping through your arteries is too strong. Your arteries are the blood vessels that carry blood from your heart throughout your body. A blood pressure reading consists of a higher number over a lower number, such as 110/72. The higher number (systolic) is the pressure inside your arteries when your heart pumps. The lower number (diastolic) is the pressure inside your arteries when your heart relaxes. Ideally you want your blood pressure below 120/80. Hypertension forces your heart to work harder to pump blood. Your arteries may become narrow or stiff. Having hypertension puts you at risk for heart disease, stroke, and other problems.  RISK FACTORS Some risk factors for high blood pressure are controllable. Others are not.  Risk factors you cannot control include:   Race. You may be at higher risk if you are African American.  Age. Risk increases with age.  Gender. Men are at higher risk than women before age 8 years. After age 47, women are at higher risk than men. Risk factors you can control include:  Not getting enough exercise or physical activity.  Being overweight.  Getting too much fat, sugar, calories, or salt in your diet.  Drinking too much alcohol. SIGNS AND SYMPTOMS Hypertension does not usually cause signs or symptoms. Extremely high blood pressure (hypertensive crisis) may cause headache, anxiety, shortness of breath, and nosebleed. DIAGNOSIS  To check if you have hypertension, your health care provider will measure your blood pressure while you are seated, with your arm held at the level of your heart. It should be measured at least twice using the same arm. Certain conditions can cause a difference in blood pressure between your right and left arms. A blood pressure reading that is higher than normal on one occasion does not mean that you need treatment. If one blood pressure reading is high, ask your health care provider about having it checked again. TREATMENT    Treating high blood pressure includes making lifestyle changes and possibly taking medicine. Living a healthy lifestyle can help lower high blood pressure. You may need to change some of your habits. Lifestyle changes may include:  Following the DASH diet. This diet is high in fruits, vegetables, and whole grains. It is low in salt, red meat, and added sugars.  Getting at least 2 hours of brisk physical activity every week.  Losing weight if necessary.  Not smoking.  Limiting alcoholic beverages.  Learning ways to reduce stress. If lifestyle changes are not enough to get your blood pressure under control, your health care provider may prescribe medicine. You may need to take more than one. Work closely with your health care provider to understand the risks and benefits. HOME CARE INSTRUCTIONS  Have your blood pressure rechecked as directed by your health care provider.   Take medicines only as directed by your health care provider. Follow the directions carefully. Blood pressure medicines must be taken as prescribed. The medicine does not work as well when you skip doses. Skipping doses also puts you at risk for problems.  Do not smoke.   Monitor your blood pressure at home as directed by your health care provider. SEEK MEDICAL CARE IF:   You think you are having a reaction to medicines taken.  You have recurrent headaches or feel dizzy.  You have swelling in your ankles.  You have trouble with your vision. SEEK IMMEDIATE MEDICAL CARE IF:  You develop a severe headache or confusion.  You have unusual weakness, numbness, or feel faint.  You have severe chest or abdominal pain.  You vomit repeatedly.  You have trouble breathing. MAKE SURE YOU:   Understand these instructions.  Will watch your condition.  Will get help right away if you are not doing well or get worse. Document Released: 11/21/2005 Document Revised: 04/07/2014 Document Reviewed:  09/13/2013 [facility] Patient Information 2015 [place], [place]. This information is not intended to replace advice given to you by your health care provider. Make sure you discuss any questions you have with your health care provider.  Managing Your High Blood Pressure Blood pressure is a measurement of how forceful your blood is pressing against the walls of the arteries. Arteries are muscular tubes within the circulatory system. Blood pressure does not stay the same. Blood pressure rises when you are active, excited, or nervous; and it lowers during sleep and relaxation. If the numbers measuring your blood pressure stay above normal most of the time, you are at risk for health problems. High blood pressure (hypertension) is a long-term (chronic) condition in which blood pressure is elevated. A blood pressure reading is recorded as two numbers, such as 120 over 80 (or 120/80). The first, higher number is called the systolic pressure. It is a measure of the pressure in your arteries as the heart beats. The second, lower number is called the diastolic pressure. It is a measure of the pressure in your arteries as the heart relaxes between beats.  Keeping your blood pressure in a normal range is important to your overall health and prevention of health problems, such as heart disease and stroke. When your blood pressure is uncontrolled, your heart has to work harder than normal. High blood pressure is a very common condition in adults because blood pressure tends to rise with age. Men and women are equally likely to have hypertension but at different times in life. Before age 45, men are more likely to have hypertension. After 39 years of age, women are more likely to have it. Hypertension is especially common in African Americans. This condition often has no signs or symptoms. The cause of the condition is usually not known. Your caregiver can help you come up with a plan to keep your blood pressure in a normal,  healthy range. BLOOD PRESSURE STAGES Blood pressure is classified into four stages: normal, prehypertension, stage 1, and stage 2. Your blood pressure reading will be used to determine what type of treatment, if any, is necessary. Appropriate treatment options are tied to these four stages:  Normal  Systolic pressure (mm Hg): below 120.  Diastolic pressure (mm Hg): below 80. Prehypertension  Systolic pressure (mm Hg): 120 to 139.  Diastolic pressure (mm Hg): 80 to 89. Stage1  Systolic pressure (mm Hg): 140 to 159.  Diastolic pressure (mm Hg): 90 to 99. Stage2  Systolic pressure (mm Hg): 160 or above.  Diastolic pressure (mm Hg): 100 or above. RISKS RELATED TO HIGH BLOOD PRESSURE Managing your blood pressure is an important responsibility. Uncontrolled high blood pressure can lead to:  A heart attack.  A stroke.  A weakened blood vessel (aneurysm).  Heart failure.  Kidney damage.  Eye damage.  Metabolic syndrome.  Memory and concentration problems. HOW TO MANAGE YOUR BLOOD PRESSURE Blood pressure can be managed effectively with lifestyle changes and medicines (if needed). Your caregiver will help you come up with a plan to bring your blood pressure within a normal range. Your plan should include the following: Education  Read all information provided by your caregivers about how to control blood pressure.  Educate yourself on the latest guidelines and treatment recommendations. New research is always being done to further define the risks and treatments for high blood pressure. Lifestylechanges  Control your weight.  Avoid smoking.  Stay physically active.  Reduce the amount of salt in your diet.  Reduce stress.  Control any chronic conditions, such as high cholesterol or diabetes.  Reduce your alcohol intake. Medicines  Several medicines (antihypertensive medicines) are available, if needed, to bring blood pressure within a normal  range. Communication  Review all the medicines you take with your caregiver because there may be side effects or interactions.  Talk with your caregiver about your diet, exercise habits, and other lifestyle factors that may be contributing to high blood pressure.  See your caregiver regularly. Your caregiver can help you create and adjust your plan for managing high blood pressure. RECOMMENDATIONS FOR TREATMENT AND FOLLOW-UP  The following recommendations are based on current guidelines for managing high blood pressure in nonpregnant adults. Use these recommendations to identify the proper follow-up period or treatment option based on your blood pressure reading. You can discuss these options with your caregiver.  Systolic pressure of 120 to 139 or diastolic pressure of 80 to 89: Follow up with your caregiver as directed.  Systolic pressure of 140 to 160 or diastolic pressure of 90 to 100: Follow up with your caregiver within 2 months.  Systolic pressure above 160 or diastolic pressure above 100: Follow up with your caregiver within 1 month.  Systolic pressure above 180 or diastolic pressure above 110: Consider antihypertensive therapy; follow up with your caregiver within 1 week.  Systolic pressure above 200 or diastolic pressure above 120: Begin antihypertensive therapy; follow up with your caregiver within 1 week. Document Released: 08/15/2012 Document Reviewed: 08/15/2012 [facility] Patient Information 2015 [place], [place]. This information is not intended to replace advice given to you by your health care provider. Make sure you discuss any questions you have with your health care provider.  Peripheral Edema You have swelling in your legs (peripheral edema). This swelling is due to excess accumulation of salt and water in your body. Edema may be a sign of heart, kidney or liver disease, or a side effect of a medication. It may also be due to problems in the leg veins. Elevating your legs  and using special support stockings may be very helpful, if the cause of the swelling is due to poor venous circulation. Avoid long periods of standing, whatever the cause. Treatment of edema depends on identifying the cause. Chips, pretzels, pickles and other salty foods should be avoided. Restricting salt in your diet is almost always needed. Water pills (diuretics) are often used to remove the excess salt and water from your body via urine. These medicines prevent the kidney from reabsorbing sodium. This increases urine flow. Diuretic treatment may also result in lowering of potassium levels in your body. Potassium supplements may be needed if you have to use diuretics daily. Daily weights can help you keep track of  your progress in clearing your edema. You should call your caregiver for follow up care as recommended. SEEK IMMEDIATE MEDICAL CARE IF:   You have increased swelling, pain, redness, or heat in your legs.  You develop shortness of breath, especially when lying down.  You develop chest or abdominal pain, weakness, or fainting.  You have a fever. Document Released: 12/29/2004 Document Revised: 02/13/2012 Document Reviewed: 12/09/2009 [facility] Patient Information 2015 [place], [place]. This information is not intended to replace advice given to you by your health care provider. Make sure you discuss any questions you have with your health care provider.  Obesity Obesity is defined as having too much total body fat and a body mass index (BMI) of 30 or more. BMI is an estimate of body fat and is calculated from your height and weight. Obesity happens when you consume more calories than you can burn by exercising or performing daily physical tasks. Prolonged obesity can cause major illnesses or emergencies, such as:   Stroke.  Heart disease.  Diabetes.  Cancer.  Arthritis.  High blood pressure (hypertension).  High cholesterol.  Sleep apnea.  Erectile  dysfunction.  Infertility problems. CAUSES   Regularly eating unhealthy foods.  Physical inactivity.  Certain disorders, such as an underactive thyroid (hypothyroidism), Cushing's syndrome, and polycystic ovarian syndrome.  Certain medicines, such as steroids, some depression medicines, and antipsychotics.  Genetics.  Lack of sleep. DIAGNOSIS  A health care provider can diagnose obesity after calculating your BMI. Obesity will be diagnosed if your BMI is 30 or higher.  There are other methods of measuring obesity levels. Some other methods include measuring your skinfold thickness, your waist circumference, and comparing your hip circumference to your waist circumference. TREATMENT  A healthy treatment program includes some or all of the following:  Long-term dietary changes.  Exercise and physical activity.  Behavioral and lifestyle changes.  Medicine only under the supervision of your health care provider. Medicines may help, but only if they are used with diet and exercise programs. An unhealthy treatment program includes:  Fasting.  Fad diets.  Supplements and drugs. These choices do not succeed in long-term weight control.  HOME CARE INSTRUCTIONS   Exercise and perform physical activity as directed by your health care provider. To increase physical activity, try the following:  Use stairs instead of elevators.  Park farther away from store entrances.  Garden, bike, or walk instead of watching television or using the computer.  Eat healthy, low-calorie foods and drinks on a regular basis. Eat more fruits and vegetables. Use low-calorie cookbooks or take healthy cooking classes.  Limit fast food, sweets, and processed snack foods.  Eat smaller portions.  Keep a daily journal of everything you eat. There are many free websites to help you with this. It may be helpful to measure your foods so you can determine if you are eating the correct portion sizes.  Avoid  drinking alcohol. Drink more water and drinks without calories.  Take vitamins and supplements only as recommended by your health care provider.  Weight-loss support groups, Government social research officer, counselors, and stress reduction education can also be very helpful. SEEK IMMEDIATE MEDICAL CARE IF:  You have chest pain or tightness.  You have trouble breathing or feel short of breath.  You have weakness or leg numbness.  You feel confused or have trouble talking.  You have sudden changes in your vision. MAKE SURE YOU:  Understand these instructions.  Will watch your condition.  Will get help right away  if you are not doing well or get worse. Document Released: 12/29/2004 Document Revised: 04/07/2014 Document Reviewed: 12/28/2011 [facility] Patient Information 2015 [place], [place]. This information is not intended to replace advice given to you by your health care provider. Make sure you discuss any questions you have with your health care provider.

## 2015-09-05 NOTE — ED Notes (Signed)
Needing refills on BP meds; ran out 2 weeks ago Asymptomatic... A&O x4... No acute distress.

## 2015-09-05 NOTE — ED Provider Notes (Signed)
CSN: [id number]     Arrival date & time 09/05/15  1300 History   First MD Initiated Contact with Patient 09/05/15 1324     Chief Complaint  Patient presents with  . Medication Refill   (Consider location/radiation/quality/duration/timing/severity/associated sxs/prior Treatment) HPI Comments: 39 year old morbidly obese male is here for refill of his blood pressure medicine. He denies having any symptoms. Denies chest pain, shortness of breath, abdominal pain or edema. He has been numerous times to the emergency department and approxamately 3 times to the urgent care for blood pressure refills. Although he is stated in his previous visits that he had up coming appointments he apparently missed them or did not have them to begin with. He now states that he has an appointment with a doctor this month around the 20th or just after.   Past Medical History  Diagnosis Date  . Hypertension   . GERD (gastroesophageal reflux disease)   . Chicken pox   . Sleep apnea    Past Surgical History  Procedure Laterality Date  . Unremarkable.     Family History  Problem Relation Age of Onset  . Hypertension Father   . Depression Father   . Hypertension Mother   . Kidney disease Maternal Grandmother     Dialysis  . Hypertension Maternal Grandmother   . Healthy Brother     x1  . Healthy Sister     x3  . Colon cancer Neg Hx   . Esophageal cancer Neg Hx   . Rectal cancer Neg Hx   . Stomach cancer Neg Hx    Social History  Substance Use Topics  . Smoking status: Never Smoker   . Smokeless tobacco: Never Used  . Alcohol Use: 0.0 oz/week     Comment: occasional    Review of Systems  Constitutional: Negative for fever, activity change and fatigue.  HENT: Negative.   Respiratory: Negative.  Negative for cough and shortness of breath.   Cardiovascular: Negative for chest pain and leg swelling.  Gastrointestinal: Negative.   Neurological: Negative.     Allergies  Review of patient's  allergies indicates no known allergies.  Home Medications   Prior to Admission medications   Medication Sig Start Date End Date Taking? Authorizing Provider  amLODipine (NORVASC) 10 MG tablet Take 1 tablet (10 mg total) by mouth daily. 09/05/15   Hayden Rasmussen, NP  lisinopril-hydrochlorothiazide (ZESTORETIC) 20-25 MG tablet Take 1 tablet by mouth daily. 09/05/15   Hayden Rasmussen, NP   Meds Ordered and Administered this Visit  Medications - No data to display  BP 148/72 mmHg  Pulse 89  Temp(Src) 98.6 F (37 C) (Oral)  Resp 18  SpO2 98% No data found.   Physical Exam  Constitutional: He is oriented to person, place, and time. He appears well-developed and well-nourished. No distress.  Eyes: EOM are normal.  Neck: Normal range of motion. Neck supple.  Cardiovascular: Normal rate, regular rhythm and normal heart sounds.   Pulmonary/Chest: Effort normal and breath sounds normal. No respiratory distress. He has no wheezes.  Musculoskeletal:  Lower extremities with 2-3+ pitting edema  Neurological: He is alert and oriented to person, place, and time. No cranial nerve deficit. He exhibits normal muscle tone.  Skin: Skin is warm and dry.  Nursing note and vitals reviewed.   ED Course  Procedures (including critical care time)  Labs Review Labs Reviewed  POCT I-STAT, CHEM 8   Results for orders placed or performed during the hospital encounter of 09/05/15  I-STAT, chem 8  Result Value Ref Range   Sodium 140 135 - 145 mmol/L   Potassium 4.8 3.5 - 5.1 mmol/L   Chloride 101 101 - 111 mmol/L   BUN 13 6 - 20 mg/dL   Creatinine, Ser 7.82 0.61 - 1.24 mg/dL   Glucose, Bld 90 65 - 99 mg/dL   Calcium, Ion 9.56 2.13 - 1.23 mmol/L   TCO2 30 0 - 100 mmol/L   Hemoglobin 15.3 13.0 - 17.0 g/dL   HCT 08.6 57.8 - 46.9 %     Imaging Review No results found.   Visual Acuity Review  Right Eye Distance:   Left Eye Distance:   Bilateral Distance:    Right Eye Near:   Left Eye Near:     Bilateral Near:         MDM   1. Essential hypertension   2. Peripheral edema   3. Morbid obesity due to excess calories (HCC)   4. Encounter for medication refill    Refill of her Zestoretic 20/25 mg #30 one daily Norvasc 10 mg one daily #30 Must follow-up with PCP this month as scheduled after the 20th. The urgent care will not be able to continue to provide primary care medicine refills.      Hayden Rasmussen, NP 09/05/15 1420

## 2015-09-17 ENCOUNTER — Telehealth: Payer: Self-pay | Admitting: General Practice

## 2015-09-17 NOTE — Telephone Encounter (Signed)
Would like to know if you would take on as a patient

## 2015-09-17 NOTE — Telephone Encounter (Signed)
Ok with me 

## 2015-09-18 NOTE — Telephone Encounter (Signed)
Got patient scheduled

## 2015-10-07 ENCOUNTER — Telehealth: Payer: Self-pay | Admitting: Internal Medicine

## 2015-10-07 ENCOUNTER — Ambulatory Visit (INDEPENDENT_AMBULATORY_CARE_PROVIDER_SITE_OTHER): Payer: BLUE CROSS/BLUE SHIELD | Admitting: Internal Medicine

## 2015-10-07 ENCOUNTER — Encounter: Payer: Self-pay | Admitting: Internal Medicine

## 2015-10-07 VITALS — BP 158/96 | HR 76 | Temp 98.0°F | Resp 16 | Ht 74.0 in | Wt 357.0 lb

## 2015-10-07 DIAGNOSIS — I10 Essential (primary) hypertension: Secondary | ICD-10-CM | POA: Diagnosis not present

## 2015-10-07 DIAGNOSIS — G4733 Obstructive sleep apnea (adult) (pediatric): Secondary | ICD-10-CM | POA: Diagnosis not present

## 2015-10-07 MED ORDER — NEBIVOLOL HCL 5 MG PO TABS: 5.0000 mg | ORAL_TABLET | Freq: Every day | ORAL | Status: DC

## 2015-10-07 MED ORDER — LISINOPRIL-HYDROCHLOROTHIAZIDE 20-25 MG PO TABS: 1.0000 | ORAL_TABLET | Freq: Every day | ORAL | Status: DC

## 2015-10-07 NOTE — Progress Notes (Signed)
Subjective:  Patient ID: Robert Cruz, male    DOB: May 08, 1976  Age: 39 y.o. MRN: [id number]  CC: Hypertension   HPI DOMINIE BENEDICK presents for evaluation and treatment of hypertension. He has been taking a combination of amlodipine, lisinopril and hydrochlorothiazide. Today he complains of swelling in his ankles and feet. This occurs when he is at work and on his feet for several hours. He also complains that his blood pressure is not very well controlled. He has sleep apnea that is not being treated.  Outpatient Prescriptions Prior to Visit  Medication Sig Dispense Refill  . amLODipine (NORVASC) 10 MG tablet Take 1 tablet (10 mg total) by mouth daily. 30 tablet 0  . lisinopril-hydrochlorothiazide (ZESTORETIC) 20-25 MG tablet Take 1 tablet by mouth daily. 30 tablet 0   No facility-administered medications prior to visit.    ROS Review of Systems  Constitutional: Positive for fatigue and unexpected weight change (wt gain). Negative for fever, chills, diaphoresis and appetite change.  HENT: Negative.  Negative for trouble swallowing.   Eyes: Negative.   Respiratory: Positive for apnea. Negative for cough, choking, chest tightness, shortness of breath, wheezing and stridor.   Cardiovascular: Positive for leg swelling. Negative for chest pain and palpitations.  Gastrointestinal: Negative.  Negative for nausea, vomiting, abdominal pain, diarrhea, constipation and blood in stool.  Endocrine: Negative.   Genitourinary: Negative.  Negative for difficulty urinating.  Musculoskeletal: Negative.  Negative for myalgias, back pain, joint swelling and arthralgias.  Skin: Negative.  Negative for rash.  Allergic/Immunologic: Negative.   Neurological: Negative.   Hematological: Negative.  Negative for adenopathy. Does not bruise/bleed easily.  Psychiatric/Behavioral: Negative.     Objective:  BP 158/96 mmHg  Pulse 98  Temp(Src) 98 F (36.7 C) (Oral)  Resp 76  Ht  (1.88 m)  Wt  357 lb (161.934 kg)  BMI 45.82 kg/m2  SpO2 99%  BP Readings from Last 3 Encounters:  10/07/15 158/96  09/05/15 148/72  06/11/15 153/106    Wt Readings from Last 3 Encounters:  10/07/15 357 lb (161.934 kg)  04/03/15 296 lb (134.265 kg)  03/25/15 297 lb (134.718 kg)    Physical Exam  Constitutional: He is oriented to person, place, and time. He appears well-developed and well-nourished. No distress.  HENT:  Head: Normocephalic and atraumatic.  Mouth/Throat: Oropharynx is clear and moist. No oropharyngeal exudate.  Eyes: Conjunctivae are normal. Right eye exhibits no discharge. Left eye exhibits no discharge. No scleral icterus.  Neck: Normal range of motion. Neck supple. No JVD present. No tracheal deviation present. No thyromegaly present.  Cardiovascular: Normal rate, regular rhythm, normal heart sounds and intact distal pulses.  Exam reveals no gallop and no friction rub.   No murmur heard. Pulmonary/Chest: Effort normal and breath sounds normal. No stridor. No respiratory distress. He has no wheezes. He has no rales. He exhibits no tenderness.  Abdominal: Soft. Bowel sounds are normal. He exhibits no distension and no mass. There is no tenderness. There is no rebound and no guarding.  Musculoskeletal: Normal range of motion. He exhibits edema (trace pitting edema in BLE). He exhibits no tenderness.  Lymphadenopathy:    He has no cervical adenopathy.  Neurological: He is oriented to person, place, and time.  Skin: Skin is warm and dry. No rash noted. He is not diaphoretic. No erythema. No pallor.    Lab Results  Component Value Date   WBC 7.3 08/24/2014   HGB 15.3 09/05/2015   HCT 45.0  09/05/2015   PLT 308 08/24/2014   GLUCOSE 90 09/05/2015   CHOL 168 10/07/2013   TRIG 72 10/07/2013   HDL 38* 10/07/2013   LDLCALC 116* 10/07/2013   ALT 14 08/24/2014   AST 15 08/24/2014   NA 140 09/05/2015   K 4.8 09/05/2015   CL 101 09/05/2015   CREATININE 1.00 09/05/2015   BUN 13  09/05/2015   CO2 26 08/24/2014   TSH 1.493 10/07/2013   HGBA1C 6.1* 10/07/2013    No results found.  Assessment & Plan:   [name] was seen today for hypertension.  Diagnoses and all orders for this visit:  Essential hypertension- it appears he has edema related to the calcium channel blocker therapy. Will check his labs to screen for secondary causes of edema and hypertension. He will stay on the ACE inhibitor and the diuretic but will add Bystolic for better blood pressure control. I have asked him to have his sleep apnea evaluated and treated and to work on his lifestyle modifications. -     lisinopril-hydrochlorothiazide (ZESTORETIC) 20-25 MG tablet; Take 1 tablet by mouth daily. -     nebivolol (BYSTOLIC) 5 MG tablet; Take 1 tablet (5 mg total) by mouth daily. -     Basic metabolic panel; Future -     CBC with Differential/Platelet; Future -     TSH; Future -     Urinalysis, Routine w reflex microscopic (not at [facility]); Future  OSA (obstructive sleep apnea)- refer to evaluation and treatment -     Ambulatory referral to Pulmonology  I have discontinued Mr. Jakob's amLODipine. I am also having him start on nebivolol. Additionally, I am having him maintain his lisinopril-hydrochlorothiazide.  Meds ordered this encounter  Medications  . lisinopril-hydrochlorothiazide (ZESTORETIC) 20-25 MG tablet    Sig: Take 1 tablet by mouth daily.    Dispense:  90 tablet    Refill:  1  . nebivolol (BYSTOLIC) 5 MG tablet    Sig: Take 1 tablet (5 mg total) by mouth daily.    Dispense:  90 tablet    Refill:  1     Follow-up: Return in about 2 months (around 12/07/2015).  Sanda Lingerhomas Keana Dueitt, MD

## 2015-10-07 NOTE — Patient Instructions (Signed)
Hypertension Hypertension, commonly called high blood pressure, is when the force of blood pumping through your arteries is too strong. Your arteries are the blood vessels that carry blood from your heart throughout your body. A blood pressure reading consists of a higher number over a lower number, such as 110/72. The higher number (systolic) is the pressure inside your arteries when your heart pumps. The lower number (diastolic) is the pressure inside your arteries when your heart relaxes. Ideally you want your blood pressure below 120/80. Hypertension forces your heart to work harder to pump blood. Your arteries may become narrow or stiff. Having untreated or uncontrolled hypertension can cause heart attack, stroke, kidney disease, and other problems. RISK FACTORS Some risk factors for high blood pressure are controllable. Others are not.  Risk factors you cannot control include:   Race. You may be at higher risk if you are African American.  Age. Risk increases with age.  Gender. Men are at higher risk than women before age 45 years. After age 65, women are at higher risk than men. Risk factors you can control include:  Not getting enough exercise or physical activity.  Being overweight.  Getting too much fat, sugar, calories, or salt in your diet.  Drinking too much alcohol. SIGNS AND SYMPTOMS Hypertension does not usually cause signs or symptoms. Extremely high blood pressure (hypertensive crisis) may cause headache, anxiety, shortness of breath, and nosebleed. DIAGNOSIS To check if you have hypertension, your health care provider will measure your blood pressure while you are seated, with your arm held at the level of your heart. It should be measured at least twice using the same arm. Certain conditions can cause a difference in blood pressure between your right and left arms. A blood pressure reading that is higher than normal on one occasion does not mean that you need treatment. If  it is not clear whether you have high blood pressure, you may be asked to return on a different day to have your blood pressure checked again. Or, you may be asked to monitor your blood pressure at home for 1 or more weeks. TREATMENT Treating high blood pressure includes making lifestyle changes and possibly taking medicine. Living a healthy lifestyle can help lower high blood pressure. You may need to change some of your habits. Lifestyle changes may include:  Following the DASH diet. This diet is high in fruits, vegetables, and whole grains. It is low in salt, red meat, and added sugars.  Keep your sodium intake below 2,300 mg per day.  Getting at least 30-45 minutes of aerobic exercise at least 4 times per week.  Losing weight if necessary.  Not smoking.  Limiting alcoholic beverages.  Learning ways to reduce stress. Your health care provider may prescribe medicine if lifestyle changes are not enough to get your blood pressure under control, and if one of the following is true:  You are 18-59 years of age and your systolic blood pressure is above 140.  You are 60 years of age or older, and your systolic blood pressure is above 150.  Your diastolic blood pressure is above 90.  You have diabetes, and your systolic blood pressure is over 140 or your diastolic blood pressure is over 90.  You have kidney disease and your blood pressure is above 140/90.  You have heart disease and your blood pressure is above 140/90. Your personal target blood pressure may vary depending on your medical conditions, your age, and other factors. HOME CARE INSTRUCTIONS    Have your blood pressure rechecked as directed by your health care provider.   Take medicines only as directed by your health care provider. Follow the directions carefully. Blood pressure medicines must be taken as prescribed. The medicine does not work as well when you skip doses. Skipping doses also puts you at risk for  problems.  Do not smoke.   Monitor your blood pressure at home as directed by your health care provider. SEEK MEDICAL CARE IF:   You think you are having a reaction to medicines taken.  You have recurrent headaches or feel dizzy.  You have swelling in your ankles.  You have trouble with your vision. SEEK IMMEDIATE MEDICAL CARE IF:  You develop a severe headache or confusion.  You have unusual weakness, numbness, or feel faint.  You have severe chest or abdominal pain.  You vomit repeatedly.  You have trouble breathing. MAKE SURE YOU:   Understand these instructions.  Will watch your condition.  Will get help right away if you are not doing well or get worse.   This information is not intended to replace advice given to you by your health care provider. Make sure you discuss any questions you have with your health care provider.   Document Released: 11/21/2005 Document Revised: 04/07/2015 Document Reviewed: 09/13/2013 Elsevier Interactive Patient Education 2016 Elsevier Inc[employer]  

## 2015-10-07 NOTE — Telephone Encounter (Signed)
[facility]  States [facility] told him it would be over 400.00.  States that the pharmacy will not take discount card.

## 2015-10-07 NOTE — Progress Notes (Signed)
Pre visit review using our clinic review tool, if applicable. No additional management support is needed unless otherwise documented below in the visit note. 

## 2015-10-08 NOTE — Telephone Encounter (Signed)
PA approved. Pharmacy and pt notified. Changed to a 30 day supply with no copay

## 2015-10-08 NOTE — Telephone Encounter (Signed)
Patient states he is having issues with [facility] on filling Bystolic.  He would like something else called in to the pharmacy that would be less of an issue.  Can you please call patient in regards.

## 2015-10-08 NOTE — Telephone Encounter (Signed)
Pharmacy states PA is needed. IT will be initiated today

## 2015-10-08 NOTE — Telephone Encounter (Signed)
Spoke to pt he states pharmacy told him they were waiting on us. Last time I heard Tammy was on the phone with the phamarcy informing about a co-pay card. Will call to follow up.

## 2015-10-09 ENCOUNTER — Ambulatory Visit: Payer: BLUE CROSS/BLUE SHIELD | Admitting: Internal Medicine

## 2015-10-14 ENCOUNTER — Encounter (HOSPITAL_BASED_OUTPATIENT_CLINIC_OR_DEPARTMENT_OTHER): Payer: Self-pay | Admitting: Adult Health

## 2015-10-14 ENCOUNTER — Emergency Department (HOSPITAL_BASED_OUTPATIENT_CLINIC_OR_DEPARTMENT_OTHER)
Admission: EM | Admit: 2015-10-14 | Discharge: 2015-10-14 | Disposition: A | Discharge status: Home / Self Care | Payer: BLUE CROSS/BLUE SHIELD | Attending: Emergency Medicine | Admitting: Emergency Medicine

## 2015-10-14 DIAGNOSIS — Z8719 Personal history of other diseases of the digestive system: Secondary | ICD-10-CM | POA: Insufficient documentation

## 2015-10-14 DIAGNOSIS — K047 Periapical abscess without sinus: Secondary | ICD-10-CM | POA: Insufficient documentation

## 2015-10-14 DIAGNOSIS — Z8669 Personal history of other diseases of the nervous system and sense organs: Secondary | ICD-10-CM | POA: Diagnosis not present

## 2015-10-14 DIAGNOSIS — Z8619 Personal history of other infectious and parasitic diseases: Secondary | ICD-10-CM | POA: Diagnosis not present

## 2015-10-14 DIAGNOSIS — Z79899 Other long term (current) drug therapy: Secondary | ICD-10-CM | POA: Diagnosis not present

## 2015-10-14 DIAGNOSIS — I10 Essential (primary) hypertension: Secondary | ICD-10-CM | POA: Diagnosis not present

## 2015-10-14 DIAGNOSIS — K0889 Other specified disorders of teeth and supporting structures: Secondary | ICD-10-CM | POA: Diagnosis present

## 2015-10-14 DIAGNOSIS — M272 Inflammatory conditions of jaws: Secondary | ICD-10-CM

## 2015-10-14 MED ORDER — PENICILLIN V POTASSIUM 250 MG PO TABS
500.0000 mg | ORAL_TABLET | Freq: Four times a day (QID) | ORAL | Status: DC
  Administered 2015-10-14: 500 mg via ORAL

## 2015-10-14 MED ORDER — HYDROCODONE-ACETAMINOPHEN 5-325 MG PO TABS: 1.0000 | ORAL_TABLET | Freq: Four times a day (QID) | ORAL | Status: DC | PRN

## 2015-10-14 MED ORDER — PENICILLIN V POTASSIUM 500 MG PO TABS: 500.0000 mg | ORAL_TABLET | Freq: Four times a day (QID) | ORAL | Status: AC

## 2015-10-14 MED ORDER — PENICILLIN V POTASSIUM 250 MG PO TABS
ORAL_TABLET | ORAL | Status: AC
  Filled 2015-10-14: qty 2

## 2015-10-14 NOTE — ED Provider Notes (Addendum)
CSN: [id number]     Arrival date & time 10/14/15  [id number] History   First MD Initiated Contact with Patient 10/14/15 0535     Chief Complaint  Patient presents with  . Dental Pain     (Consider location/radiation/quality/duration/timing/severity/associated sxs/prior Treatment) HPI  This is a 39 year old male who had his teeth cleaned at the dentist about a week ago. He is here with pain and swelling in his right mandibular soft tissue that began yesterday evening but worsened about 4:00 this morning. The pain is severe enough to keep him from sleeping. He denies pain in any actual tooth. He is also complaining of right anterior cervical lymphadenopathy. He denies systemic symptoms such as fever or chills. He has taken Tylenol and ibuprofen without adequate relief.  Past Medical History  Diagnosis Date  . Hypertension   . GERD (gastroesophageal reflux disease)   . Chicken pox   . Sleep apnea    Past Surgical History  Procedure Laterality Date  . Unremarkable.     Family History  Problem Relation Age of Onset  . Hypertension Father   . Depression Father   . Hypertension Mother   . Kidney disease Maternal Grandmother     Dialysis  . Hypertension Maternal Grandmother   . Healthy Brother     x1  . Healthy Sister     x3  . Colon cancer Neg Hx   . Esophageal cancer Neg Hx   . Rectal cancer Neg Hx   . Stomach cancer Neg Hx    Social History  Substance Use Topics  . Smoking status: Never Smoker   . Smokeless tobacco: Never Used  . Alcohol Use: 0.0 oz/week     Comment: occasional    Review of Systems  All other systems reviewed and are negative.   Allergies  Amlodipine  Home Medications   Prior to Admission medications   Medication Sig Start Date End Date Taking? Authorizing Provider  lisinopril-hydrochlorothiazide (ZESTORETIC) 20-25 MG tablet Take 1 tablet by mouth daily. 10/07/15   Etta Grandchildhomas L Jones, MD  nebivolol (BYSTOLIC) 5 MG tablet Take 1 tablet (5 mg total) by  mouth daily. 10/07/15   Etta Grandchildhomas L Jones, MD   BP 164/120 mmHg  Pulse 79  Temp(Src) 97.6 F (36.4 C) (Oral)  Resp 18  SpO2 100%   Physical Exam  General: Well-developed, well-nourished male in no acute distress; appearance consistent with age of record HENT: normocephalic; atraumatic; focal soft tissue swelling and tenderness adjacent to the right mandible without fluctuance or mass, no obvious associated dental caries or tooth tenderness Eyes: pupils equal, round and reactive to light; extraocular muscles intact Neck: supple; right anterior cervical lymphadenopathy Heart: regular rate and rhythm Lungs: clear to auscultation bilaterally Abdomen: soft; nondistended Extremities: No deformity; full range of motion Neurologic: Awake, alert and oriented; motor function intact in all extremities and symmetric; no facial droop Skin: Warm and dry Psychiatric: Normal mood and affect    ED Course  Procedures (including critical care time)   MDM  We will treat him with penicillin for likely soft tissue infection of odontogenic origin and have him follow-up with his dentist Dr. Everardo BealsSharda.  Paula LibraJohn Randy Castrejon, MD 10/14/15 [id number]  Paula LibraJohn Branko Steeves, MD 10/14/15 [phone]

## 2015-10-14 NOTE — ED Notes (Addendum)
Presents with right sided mouth pain and swelling began this AM. -pt states, the inside of his mouth hurts and his lymph nodes feel swollen.

## 2015-10-19 ENCOUNTER — Emergency Department (HOSPITAL_BASED_OUTPATIENT_CLINIC_OR_DEPARTMENT_OTHER)
Admission: EM | Admit: 2015-10-19 | Discharge: 2015-10-19 | Disposition: A | Discharge status: Home / Self Care | Payer: BLUE CROSS/BLUE SHIELD | Attending: Emergency Medicine | Admitting: Emergency Medicine

## 2015-10-19 ENCOUNTER — Emergency Department (HOSPITAL_BASED_OUTPATIENT_CLINIC_OR_DEPARTMENT_OTHER): Payer: BLUE CROSS/BLUE SHIELD

## 2015-10-19 ENCOUNTER — Encounter (HOSPITAL_BASED_OUTPATIENT_CLINIC_OR_DEPARTMENT_OTHER): Payer: Self-pay

## 2015-10-19 ENCOUNTER — Telehealth: Payer: Self-pay | Admitting: Internal Medicine

## 2015-10-19 DIAGNOSIS — Z79899 Other long term (current) drug therapy: Secondary | ICD-10-CM | POA: Insufficient documentation

## 2015-10-19 DIAGNOSIS — I159 Secondary hypertension, unspecified: Secondary | ICD-10-CM | POA: Diagnosis not present

## 2015-10-19 DIAGNOSIS — R42 Dizziness and giddiness: Secondary | ICD-10-CM | POA: Insufficient documentation

## 2015-10-19 DIAGNOSIS — Z8619 Personal history of other infectious and parasitic diseases: Secondary | ICD-10-CM | POA: Diagnosis not present

## 2015-10-19 DIAGNOSIS — Z8719 Personal history of other diseases of the digestive system: Secondary | ICD-10-CM | POA: Diagnosis not present

## 2015-10-19 DIAGNOSIS — Z8669 Personal history of other diseases of the nervous system and sense organs: Secondary | ICD-10-CM | POA: Insufficient documentation

## 2015-10-19 DIAGNOSIS — I10 Essential (primary) hypertension: Secondary | ICD-10-CM | POA: Diagnosis present

## 2015-10-19 LAB — BASIC METABOLIC PANEL
ANION GAP: 7 (ref 5–15)
BUN: 12 mg/dL (ref 6–20)
CHLORIDE: 102 mmol/L (ref 101–111)
CO2: 29 mmol/L (ref 22–32)
Calcium: 9.1 mg/dL (ref 8.9–10.3)
Creatinine, Ser: 1.02 mg/dL (ref 0.61–1.24)
GFR calc Af Amer: >60 mL/min (ref >60)
GFR calc non Af Amer: >60 mL/min (ref >60)
Glucose, Bld: 82 mg/dL (ref 65–99)
POTASSIUM: 3.2 mmol/L — AB (ref 3.5–5.1)
SODIUM: 138 mmol/L (ref 135–145)

## 2015-10-19 LAB — CBC WITH DIFFERENTIAL/PLATELET
BASOS ABS: 0 10*3/uL (ref 0.0–0.1)
Basophils Relative: 1 %
EOS PCT: 5 %
Eosinophils Absolute: 0.4 10*3/uL (ref 0.0–0.7)
HCT: 38.5 % — ABNORMAL LOW (ref 39.0–52.0)
HEMOGLOBIN: 12.8 g/dL — AB (ref 13.0–17.0)
LYMPHS PCT: 33 %
Lymphs Abs: 2.8 10*3/uL (ref 0.7–4.0)
MCH: 26.6 pg (ref 26.0–34.0)
MCHC: 33.2 g/dL (ref 30.0–36.0)
MCV: 79.9 fL (ref 78.0–100.0)
Monocytes Absolute: 0.6 10*3/uL (ref 0.1–1.0)
Monocytes Relative: 7 %
NEUTROS PCT: 54 %
Neutro Abs: 4.6 10*3/uL (ref 1.7–7.7)
Platelets: 311 10*3/uL (ref 150–400)
RBC: 4.82 MIL/uL (ref 4.22–5.81)
RDW: 14.6 % (ref 11.5–15.5)
WBC: 8.4 10*3/uL (ref 4.0–10.5)

## 2015-10-19 NOTE — ED Notes (Signed)
Reports high blood pressure and headaches x 5 days.  Reports called PCP and was told to come here.  Reports checked it at [facility] earlier today and it was 150/116

## 2015-10-19 NOTE — Telephone Encounter (Signed)
Patient Name: Robert Cruz DOB: 11/21/1976 Initial Comment Caller states, his blood pressure is very high, He is taking a new Blood pressure Rx, he has a bad headache, 156/116 Nurse Assessment Nurse: Charna Elizabethrumbull, RN, [facility] Date/Time (Eastern Time): 10/19/2015 2:16:55 PM Confirm and document reason for call. If symptomatic, describe symptoms. ---Caller states his blood pressure was 156/116 about 30 minutes ago. He developed a headache this morning. No fever. Has the patient traveled out of the country within the last 30 days? ---No Does the patient have any new or worsening symptoms? ---Yes Will a triage be completed? ---Yes Related visit to physician within the last 2 weeks? ---Yes Does the PT have any chronic conditions? (i.e. diabetes, asthma, etc.) ---Yes List chronic conditions. ---High Blood Pressure, Wound Infection Guidelines Guideline Title Affirmed Question Affirmed Notes High Blood Pressure [1] BP # 160 / 100 AND [2] cardiac or neurologic symptoms (e.g., chest pain, difficulty breathing, unsteady gait, blurred vision) Final Disposition User Go to ED Now Charna Elizabethrumbull, RN, [facility] Disagree/Comply: Comply

## 2015-10-19 NOTE — Discharge Instructions (Signed)
Continue your blood pressure medications as prescribed and keep your appointment for follow up tomorrow with your primary care physician.  Return with sudden worsening symptoms or new concerning symptoms.  Hypertension Hypertension, commonly called high blood pressure, is when the force of blood pumping through your arteries is too strong. Your arteries are the blood vessels that carry blood from your heart throughout your body. A blood pressure reading consists of a higher number over a lower number, such as 110/72. The higher number (systolic) is the pressure inside your arteries when your heart pumps. The lower number (diastolic) is the pressure inside your arteries when your heart relaxes. Ideally you want your blood pressure below 120/80. Hypertension forces your heart to work harder to pump blood. Your arteries may become narrow or stiff. Having untreated or uncontrolled hypertension can cause heart attack, stroke, kidney disease, and other problems. RISK FACTORS Some risk factors for high blood pressure are controllable. Others are not.  Risk factors you cannot control include:   Race. You may be at higher risk if you are African American.  Age. Risk increases with age.  Gender. Men are at higher risk than women before age 39 years. After age 39, women are at higher risk than men. Risk factors you can control include:  Not getting enough exercise or physical activity.  Being overweight.  Getting too much fat, sugar, calories, or salt in your diet.  Drinking too much alcohol. SIGNS AND SYMPTOMS Hypertension does not usually cause signs or symptoms. Extremely high blood pressure (hypertensive crisis) may cause headache, anxiety, shortness of breath, and nosebleed. DIAGNOSIS To check if you have hypertension, your health care provider will measure your blood pressure while you are seated, with your arm held at the level of your heart. It should be measured at least twice using the same  arm. Certain conditions can cause a difference in blood pressure between your right and left arms. A blood pressure reading that is higher than normal on one occasion does not mean that you need treatment. If it is not clear whether you have high blood pressure, you may be asked to return on a different day to have your blood pressure checked again. Or, you may be asked to monitor your blood pressure at home for 1 or more weeks. TREATMENT Treating high blood pressure includes making lifestyle changes and possibly taking medicine. Living a healthy lifestyle can help lower high blood pressure. You may need to change some of your habits. Lifestyle changes may include:  Following the DASH diet. This diet is high in fruits, vegetables, and whole grains. It is low in salt, red meat, and added sugars.  Keep your sodium intake below 2,300 mg per day.  Getting at least 30-45 minutes of aerobic exercise at least 4 times per week.  Losing weight if necessary.  Not smoking.  Limiting alcoholic beverages.  Learning ways to reduce stress. Your health care provider may prescribe medicine if lifestyle changes are not enough to get your blood pressure under control, and if one of the following is true:  You are 4418-39 years of age and your systolic blood pressure is above 140.  You are 39 years of age or older, and your systolic blood pressure is above 150.  Your diastolic blood pressure is above 90.  You have diabetes, and your systolic blood pressure is over 140 or your diastolic blood pressure is over 90.  You have kidney disease and your blood pressure is above 140/90.  You have heart disease and your blood pressure is above 140/90. Your personal target blood pressure may vary depending on your medical conditions, your age, and other factors. HOME CARE INSTRUCTIONS  Have your blood pressure rechecked as directed by your health care provider.   Take medicines only as directed by your health  care provider. Follow the directions carefully. Blood pressure medicines must be taken as prescribed. The medicine does not work as well when you skip doses. Skipping doses also puts you at risk for problems.  Do not smoke.   Monitor your blood pressure at home as directed by your health care provider. SEEK MEDICAL CARE IF:   You think you are having a reaction to medicines taken.  You have recurrent headaches or feel dizzy.  You have swelling in your ankles.  You have trouble with your vision. SEEK IMMEDIATE MEDICAL CARE IF:  You develop a severe headache or confusion.  You have unusual weakness, numbness, or feel faint.  You have severe chest or abdominal pain.  You vomit repeatedly.  You have trouble breathing. MAKE SURE YOU:   Understand these instructions.  Will watch your condition.  Will get help right away if you are not doing well or get worse.   This information is not intended to replace advice given to you by your health care provider. Make sure you discuss any questions you have with your health care provider.   Document Released: 11/21/2005 Document Revised: 04/07/2015 Document Reviewed: 09/13/2013 Elsevier Interactive Patient Education [employer].

## 2015-10-19 NOTE — ED Provider Notes (Signed)
CSN: [id number]     Arrival date & time 10/19/15  1548 History  By signing my name below, I, Budd Palmer, attest that this documentation has been prepared under the direction and in the presence of Leta Baptist, MD. Electronically Signed: Budd Palmer, ED Scribe. 10/19/2015. 4:46 PM.    Chief Complaint  Patient presents with  . Hypertension   The history is provided by the patient. No language interpreter was used.   HPI Comments: Robert Cruz is a 39 y.o. male with a PMHx of HTN and GERD who presents to the Emergency Department complaining of HTN onset 5 days ago. He states he has been measuring his BP several times today and notes it has been high each time. He reports associated intermittent, pulsing, throbbing, frontal HA that is worse at night, and dizziness. He states he has had HA in the past, but states it has not been this severe. He notes he has taken ibuprofen for this with no relief. He states he is scheduled for a sleep study next week. He notes he went to the dentist one week ago, after which his lymph nodes and face swelled up, which has since resolved. He notes he was seen in the ED for this on 11/9 and was given antibiotics, which he has been taking as prescribed. He also reports his medication was changed 2 weeks ago from amlodipine to Bystolic by his PCP. He states he is getting married and has a baby due this summer, and has thus been trying to get his life and health in order. Pt denies visual disturbances and n/v.  He says that his headache resolved when he was being brought back to his room and currently feels fine.  Past Medical History  Diagnosis Date  . Hypertension   . GERD (gastroesophageal reflux disease)   . Chicken pox   . Sleep apnea    Past Surgical History  Procedure Laterality Date  . Unremarkable.     Family History  Problem Relation Age of Onset  . Hypertension Father   . Depression Father   . Hypertension Mother   . Kidney disease  Maternal Grandmother     Dialysis  . Hypertension Maternal Grandmother   . Healthy Brother     x1  . Healthy Sister     x3  . Colon cancer Neg Hx   . Esophageal cancer Neg Hx   . Rectal cancer Neg Hx   . Stomach cancer Neg Hx    Social History  Substance Use Topics  . Smoking status: Never Smoker   . Smokeless tobacco: Never Used  . Alcohol Use: 0.0 oz/week     Comment: occasional    Review of Systems  Constitutional: Negative for fever, chills and fatigue.  HENT: Negative for congestion, postnasal drip, rhinorrhea and sinus pressure.   Eyes: Negative for visual disturbance.  Respiratory: Negative for cough, chest tightness and shortness of breath.   Cardiovascular: Negative for chest pain, palpitations and leg swelling.  Gastrointestinal: Negative for nausea, vomiting, abdominal pain, diarrhea and constipation.  Genitourinary: Negative for dysuria, hematuria, decreased urine volume and difficulty urinating.  Musculoskeletal: Negative for myalgias and back pain.  Skin: Negative for rash and wound.  Neurological: Positive for headaches. Negative for syncope, facial asymmetry, speech difficulty and weakness.  Hematological: Does not bruise/bleed easily.    Allergies  Amlodipine  Home Medications   Prior to Admission medications   Medication Sig Start Date End Date Taking? Authorizing Provider  nebivolol (BYSTOLIC) 5 MG tablet Take 1 tablet (5 mg total) by mouth daily. 10/07/15  Yes Etta Grandchild, MD  acetaminophen-codeine (TYLENOL #3) 300-30 MG tablet Take 1 tablet by mouth every 4 (four) hours as needed for moderate pain. 10/20/15   Veryl Speak, FNP  HYDROcodone-acetaminophen (NORCO) 5-325 MG tablet Take 1-2 tablets by mouth every 6 (six) hours as needed (for pain). 10/14/15   John Molpus, MD  lisinopril-hydrochlorothiazide (ZESTORETIC) 20-12.5 MG tablet Take 2 tablets by mouth daily. 10/20/15   Veryl Speak, FNP  penicillin v potassium (VEETID) 500 MG tablet Take  1 tablet (500 mg total) by mouth 4 (four) times daily. 10/14/15 10/21/15  John Molpus, MD   BP 157/112 mmHg  Pulse 71  Temp(Src) 98.3 F (36.8 C) (Oral)  Resp 18  Ht  (1.854 m)  Wt 294 lb (133.358 kg)  BMI 38.80 kg/m2  SpO2 96% Physical Exam  Constitutional: He is oriented to person, place, and time. He appears well-developed and well-nourished. No distress.  HENT:  Head: Normocephalic and atraumatic.  Right Ear: External ear normal.  Left Ear: External ear normal.  Mouth/Throat: Oropharynx is clear and moist. No oropharyngeal exudate.  Eyes: EOM are normal. Pupils are equal, round, and reactive to light.  Fundoscopic exam:      The right eye shows no papilledema.       The left eye shows no papilledema.  Neck: Normal range of motion. Neck supple.  Cardiovascular: Normal rate, regular rhythm, normal heart sounds and intact distal pulses.   No murmur heard. Pulmonary/Chest: Effort normal. No respiratory distress. He has no wheezes. He has no rales.  Abdominal: Soft. He exhibits no distension. There is no tenderness.  Musculoskeletal: He exhibits no edema.  Neurological: He is alert and oriented to person, place, and time. He has normal strength. No cranial nerve deficit or sensory deficit. He exhibits normal muscle tone. Coordination and gait normal.  Skin: Skin is warm and dry. No rash noted. He is not diaphoretic.  Vitals reviewed.   ED Course  Procedures  DIAGNOSTIC STUDIES: Oxygen Saturation is 96% on RA, adequate by my interpretation.    COORDINATION OF CARE: 4:45 PM - Discussed plans to order diagnostic studies and imaging, as well as an EKG. Pt advised of plan for treatment and pt agrees.  Labs Review Labs Reviewed  CBC WITH DIFFERENTIAL/PLATELET - Abnormal; Notable for the following:    Hemoglobin 12.8 (*)    HCT 38.5 (*)    All other components within normal limits  BASIC METABOLIC PANEL - Abnormal; Notable for the following:    Potassium 3.2 (*)    All  other components within normal limits    Imaging Review Ct Head Wo Contrast  10/19/2015  CLINICAL DATA:  Severe headache frontal region EXAM: CT HEAD WITHOUT CONTRAST TECHNIQUE: Contiguous axial images were obtained from the base of the skull through the vertex without intravenous contrast. COMPARISON:  10/16/2012 FINDINGS: No evidence of hemorrhage, infarct, mass, hydrocephalus, or extra-axial fluid. Normal sulcation and attenuation except for tiny prominent perivascular space left-sided image 16. No evidence of sinusitis. Calvarium intact. IMPRESSION: Normal head CT Electronically Signed   By: Esperanza Heir M.D.   On: 10/19/2015 16:58   I have personally reviewed and evaluated these images and lab results as part of my medical decision-making.   EKG Interpretation   Date/Time:  Monday October 19 2015 17:07:57 EST Ventricular Rate:  65 PR Interval:  178 QRS Duration: 92 QT  Interval:  436 QTC Calculation: 453 R Axis:   42 Text Interpretation:  Normal sinus rhythm with sinus arrhythmia  Nonspecific T wave abnormality Abnormal ECG No significant change since  last tracing Confirmed by Darby Fleeman ([id number]) on 10/19/2015 5:52:08 PM      MDM  Patient seen and evaluated in stable condition.  Currently asymptomatic.  Chronic hypertension.  Neurologic examination unremarkable.  Labs were unremarkable.  Head CT normal.  Patient with follow up appointment tomorrow with PCP.  Discharged home in stable condition with instruction to keep that appointment for further BP control.  Strict return precautions given.  Patient expressed understanding and agreement with plan of care. Final diagnoses:  Secondary hypertension, unspecified    1. Uncontrolled HTN  I personally performed the services described in this documentation, which was scribed in my presence. The recorded information has been reviewed and is accurate.   Leta BaptistEmily Roe Mickael Mcnutt, MD 10/20/15 2219

## 2015-10-20 ENCOUNTER — Ambulatory Visit (INDEPENDENT_AMBULATORY_CARE_PROVIDER_SITE_OTHER): Payer: BLUE CROSS/BLUE SHIELD | Admitting: Family

## 2015-10-20 ENCOUNTER — Encounter: Payer: Self-pay | Admitting: Family

## 2015-10-20 VITALS — BP 158/98 | HR 71 | Temp 97.7°F | Resp 18 | Ht 74.0 in | Wt 353.0 lb

## 2015-10-20 DIAGNOSIS — E669 Obesity, unspecified: Secondary | ICD-10-CM

## 2015-10-20 DIAGNOSIS — I10 Essential (primary) hypertension: Secondary | ICD-10-CM

## 2015-10-20 MED ORDER — ACETAMINOPHEN-CODEINE #3 300-30 MG PO TABS: 1.0000 | ORAL_TABLET | ORAL | Status: DC | PRN

## 2015-10-20 MED ORDER — LISINOPRIL-HYDROCHLOROTHIAZIDE 20-12.5 MG PO TABS: 2.0000 | ORAL_TABLET | Freq: Every day | ORAL | Status: DC

## 2015-10-20 NOTE — Progress Notes (Signed)
Subjective:    Patient ID: Robert Cruz, male    DOB: Feb 12, 1976, 39 y.o.   MRN: [id number]  Chief Complaint  Patient presents with  . Hypertension    headaches since he was put on bystolic    HPI:  Robert Cruz is a 39 y.o. male who  has a past medical history of Hypertension; GERD (gastroesophageal reflux disease); Chicken pox; and Sleep apnea. and presents today for a follow up office visit.  1.) Hypertension - Currently maintained on lisinopril-hydrochlorothiazide and bystolic. He was recently seen in the office and his amlodipine was discontinued secondary to potential leg swelling issues. At that time he was started on Bystolic. On 11/14 he was seen in the ED for headache and increased blood pressure. His CT of the head was negative for any intracranial pathology. There were no changes to his medication regimen. He continues to experience the associated symptom of a headache. Modifying factors include ibuprofen and tylenol which usually helps with the headache but has not helped greatly.   BP Readings from Last 3 Encounters:  10/20/15 158/98  10/19/15 157/112  10/14/15 164/120    Allergies  Allergen Reactions  . Amlodipine Other (See Comments)    edema     Current Outpatient Prescriptions on File Prior to Visit  Medication Sig Dispense Refill  . HYDROcodone-acetaminophen (NORCO) 5-325 MG tablet Take 1-2 tablets by mouth every 6 (six) hours as needed (for pain). 10 tablet 0  . nebivolol (BYSTOLIC) 5 MG tablet Take 1 tablet (5 mg total) by mouth daily. 90 tablet 1  . penicillin v potassium (VEETID) 500 MG tablet Take 1 tablet (500 mg total) by mouth 4 (four) times daily. 40 tablet 0  . [DISCONTINUED] lisinopril (PRINIVIL,ZESTRIL) 20 MG tablet Take 1 tablet (20 mg total) by mouth daily. 30 tablet 1   No current facility-administered medications on file prior to visit.    Review of Systems  Constitutional: Negative for fever and chills.  Eyes: Negative for visual  disturbance.       Negative for changes in vision.  Neurological: Positive for headaches. Negative for dizziness, weakness and numbness.      Objective:    BP 158/98 mmHg  Pulse 71  Temp(Src) 97.7 F (36.5 C) (Oral)  Resp 18  Ht  (1.88 m)  Wt 353 lb (160.12 kg)  BMI 45.30 kg/m2  SpO2 97% Nursing note and vital signs reviewed.  Physical Exam  Constitutional: He is oriented to person, place, and time. He appears well-developed and well-nourished. No distress.  HENT:  Right Ear: Hearing, tympanic membrane, external ear and ear canal normal.  Left Ear: Hearing, tympanic membrane, external ear and ear canal normal.  Eyes: Conjunctivae and EOM are normal. Pupils are equal, round, and reactive to light.  Cardiovascular: Normal rate, regular rhythm, normal heart sounds and intact distal pulses.   Pulmonary/Chest: Effort normal and breath sounds normal.  Neurological: He is alert and oriented to person, place, and time.  Skin: Skin is warm and dry.  Psychiatric: He has a normal mood and affect. His behavior is normal. Judgment and thought content normal.       Assessment & Plan:   Problem List Items Addressed This Visit      Cardiovascular and Mediastinum   Essential hypertension - Primary    Hypertension remains uncontrolled greater than goal of 140/90 with current regimen. Headaches of questionable origin and most likely multifactorial including blood pressure and sleep apnea. Hold Bystolic to  check for improvement of headache. Decrease lisinopril-hydrochlorothiazide to 20-12.5 mg and increased to 2 pills daily. Continue to monitor blood pressure at home. Follow-up with pulmonology for sleep apnea study. Discussed importance of continued exercise and nutrition as it appears patient has gained approximately 60 pounds in the last 7 months. Follow-up in one month.      Relevant Medications   lisinopril-hydrochlorothiazide (ZESTORETIC) 20-12.5 MG tablet     Other   OBESITY     Discussed importance of a moderate and varied nutritional intake and physical activity to help regulate his weight and blood pressure. Start with goal of 5-10% of current body weight.

## 2015-10-20 NOTE — Assessment & Plan Note (Signed)
Hypertension remains uncontrolled greater than goal of 140/90 with current regimen. Headaches of questionable origin and most likely multifactorial including blood pressure and sleep apnea. Hold Bystolic to check for improvement of headache. Decrease lisinopril-hydrochlorothiazide to 20-12.5 mg and increased to 2 pills daily. Continue to monitor blood pressure at home. Follow-up with pulmonology for sleep apnea study. Discussed importance of continued exercise and nutrition as it appears patient has gained approximately 60 pounds in the last 7 months. Follow-up in one month.

## 2015-10-20 NOTE — Progress Notes (Signed)
Pre visit review using our clinic review tool, if applicable. No additional management support is needed unless otherwise documented below in the visit note. 

## 2015-10-20 NOTE — Assessment & Plan Note (Signed)
Discussed importance of a moderate and varied nutritional intake and physical activity to help regulate his weight and blood pressure. Start with goal of 5-10% of current body weight.

## 2015-10-20 NOTE — Patient Instructions (Signed)
Thank you for choosing [facility].  Summary/Instructions:  Your prescription(s) have been submitted to your pharmacy or been printed and provided for you. Please take as directed and contact our office if you believe you are having problem(s) with the medication(s) or have any questions.  If your symptoms worsen or fail to improve, please contact our office for further instruction, or in case of emergency go directly to the emergency room at the closest medical facility.    Please hold the Bystolic for the next week or more.   Please start the new dose of Zestoretic.

## 2015-10-27 ENCOUNTER — Institutional Professional Consult (permissible substitution): Payer: BLUE CROSS/BLUE SHIELD | Admitting: Internal Medicine

## 2015-11-06 ENCOUNTER — Institutional Professional Consult (permissible substitution): Payer: BLUE CROSS/BLUE SHIELD | Admitting: Emergency Medicine

## 2015-11-11 ENCOUNTER — Encounter: Payer: Self-pay | Admitting: Internal Medicine

## 2015-11-21 ENCOUNTER — Other Ambulatory Visit: Payer: Self-pay | Admitting: Family

## 2015-11-23 ENCOUNTER — Other Ambulatory Visit: Payer: Self-pay

## 2015-11-23 ENCOUNTER — Telehealth: Payer: Self-pay | Admitting: Internal Medicine

## 2015-11-23 MED ORDER — LISINOPRIL-HYDROCHLOROTHIAZIDE 20-12.5 MG PO TABS: 2.0000 | ORAL_TABLET | Freq: Every day | ORAL | Status: DC

## 2015-11-23 NOTE — Telephone Encounter (Signed)
Ok with me 

## 2015-11-23 NOTE — Telephone Encounter (Signed)
yes

## 2015-11-23 NOTE — Telephone Encounter (Signed)
Pt would like to transfer from Dr. Yetta BarreJones to Marcos EkeGreg Calone Please advise

## 2015-11-23 NOTE — Telephone Encounter (Signed)
error 

## 2015-12-02 ENCOUNTER — Encounter: Payer: Self-pay | Admitting: Family

## 2015-12-02 ENCOUNTER — Ambulatory Visit (INDEPENDENT_AMBULATORY_CARE_PROVIDER_SITE_OTHER): Payer: BLUE CROSS/BLUE SHIELD | Admitting: Family

## 2015-12-02 VITALS — BP 142/102 | HR 97 | Temp 98.0°F | Resp 18 | Ht 74.0 in | Wt 354.0 lb

## 2015-12-02 DIAGNOSIS — I10 Essential (primary) hypertension: Secondary | ICD-10-CM | POA: Diagnosis not present

## 2015-12-02 MED ORDER — METOPROLOL SUCCINATE ER 50 MG PO TB24: 50.0000 mg | ORAL_TABLET | Freq: Every day | ORAL | Status: DC

## 2015-12-02 MED ORDER — LISINOPRIL-HYDROCHLOROTHIAZIDE 20-12.5 MG PO TABS: 2.0000 | ORAL_TABLET | Freq: Every day | ORAL | Status: DC

## 2015-12-02 NOTE — Patient Instructions (Signed)
Thank you for choosing [facility].  Summary/Instructions:  Your prescription(s) have been submitted to your pharmacy or been printed and provided for you. Please take as directed and contact our office if you believe you are having problem(s) with the medication(s) or have any questions.  If your symptoms worsen or fail to improve, please contact our office for further instruction, or in case of emergency go directly to the emergency room at the closest medical facility.   Please continue to take your medication as prescribed.    

## 2015-12-02 NOTE — Progress Notes (Signed)
   Subjective:    Patient ID: Robert Cruz, male    DOB: 04/20/1976, 39 y.o.   MRN: [id number]  Chief Complaint  Patient presents with  . Medication follow up    BP meds    HPI:  Robert Cruz is a 39 y.o. male who  has a past medical history of Hypertension; GERD (gastroesophageal reflux disease); Chicken pox; and Sleep apnea. and presents today for an office follow-up.  1.) Hypertension - currently maintained on lisinopril-hydrochlorothiazide. Takes medications as prescribed and denies adverse side effects. Does not consistently check his blood pressure at home.    BP Readings from Last 3 Encounters:  12/02/15 142/102  10/20/15 158/98  10/19/15 157/112   Wt Readings from Last 3 Encounters:  12/02/15 354 lb (160.573 kg)  10/20/15 353 lb (160.12 kg)  10/19/15 294 lb (133.358 kg)    Allergies  Allergen Reactions  . Amlodipine Other (See Comments)    edema     Current Outpatient Prescriptions on File Prior to Visit  Medication Sig Dispense Refill  . [DISCONTINUED] lisinopril (PRINIVIL,ZESTRIL) 20 MG tablet Take 1 tablet (20 mg total) by mouth daily. 30 tablet 1   No current facility-administered medications on file prior to visit.     Review of Systems  Constitutional: Negative for fatigue.  Eyes:       Negative for changes in vision.  Respiratory: Negative for cough, chest tightness and shortness of breath.   Cardiovascular: Negative for chest pain, palpitations and leg swelling.  Neurological: Negative for dizziness and headaches.      Objective:    BP 142/102 mmHg  Pulse 97  Temp(Src) 98 F (36.7 C) (Oral)  Resp 18  Ht 6\' 2"  (1.88 m)  Wt 354 lb (160.573 kg)  BMI 45.43 kg/m2  SpO2 97% Nursing note and vital signs reviewed.  Physical Exam  Constitutional: He is oriented to person, place, and time. He appears well-developed and well-nourished. No distress.  Cardiovascular: Normal rate, regular rhythm, normal heart sounds and intact distal pulses.     Pulmonary/Chest: Effort normal and breath sounds normal.  Neurological: He is alert and oriented to person, place, and time.  Skin: Skin is warm and dry.  Psychiatric: He has a normal mood and affect. His behavior is normal. Judgment and thought content normal.       Assessment & Plan:   Problem List Items Addressed This Visit      Cardiovascular and Mediastinum   Essential hypertension - Primary    Blood pressure remains above goal of 140/90 with current regimen. Denies adverse side effects. Notes headaches are gone since starting Bystolic. Does need additional therapy. Start Metoprolol. Continue current dosage of lisinopril-hydrochlorothiazide. Follow up in 1 month or sooner.       Relevant Medications   metoprolol succinate (TOPROL-XL) 50 MG 24 hr tablet   lisinopril-hydrochlorothiazide (ZESTORETIC) 20-12.5 MG tablet

## 2015-12-02 NOTE — Progress Notes (Signed)
Pre visit review using our clinic review tool, if applicable. No additional management support is needed unless otherwise documented below in the visit note. 

## 2015-12-02 NOTE — Assessment & Plan Note (Signed)
Blood pressure remains above goal of 140/90 with current regimen. Denies adverse side effects. Notes headaches are gone since starting Bystolic. Does need additional therapy. Start Metoprolol. Continue current dosage of lisinopril-hydrochlorothiazide. Follow up in 1 month or sooner.

## 2015-12-08 ENCOUNTER — Institutional Professional Consult (permissible substitution): Payer: BLUE CROSS/BLUE SHIELD | Admitting: Internal Medicine

## 2015-12-14 ENCOUNTER — Ambulatory Visit (INDEPENDENT_AMBULATORY_CARE_PROVIDER_SITE_OTHER): Payer: Managed Care, Other (non HMO) | Admitting: Internal Medicine

## 2015-12-14 ENCOUNTER — Encounter: Payer: Self-pay | Admitting: Internal Medicine

## 2015-12-14 ENCOUNTER — Institutional Professional Consult (permissible substitution): Payer: BLUE CROSS/BLUE SHIELD | Admitting: Internal Medicine

## 2015-12-14 VITALS — BP 178/100 | HR 91 | Ht 74.0 in | Wt 353.0 lb

## 2015-12-14 DIAGNOSIS — G4733 Obstructive sleep apnea (adult) (pediatric): Secondary | ICD-10-CM | POA: Diagnosis not present

## 2015-12-14 DIAGNOSIS — I10 Essential (primary) hypertension: Secondary | ICD-10-CM | POA: Diagnosis not present

## 2015-12-14 NOTE — Patient Instructions (Signed)
Please see patient coordinator before you leave today  to schedule split night study/ mask of choice and we will set you up with whichever doctor reads your study for follow up on the best fit for you   Weight control is simply a matter of calorie balance which needs to be tilted in your favor by eating less and exercising more.  To get the most out of exercise, you need to be continuously aware that you are short of breath, but never out of breath, for 30 minutes daily. As you improve, it will actually be easier for you to do the same amount of exercise  in  30 minutes so always push to the level where you are short of breath.  If this does not result in gradual weight reduction then I strongly recommend you see a nutritionist with a food diary x 2 weeks so that we can work out a negative calorie balance which is universally effective in steady weight loss programs.  Think of your calorie balance like you do your bank account where in this case you want the balance to go down so you must take in less calories than you burn up.  It's just that simple:  Hard to do, but easy to understand.  Good luck!

## 2015-12-14 NOTE — Progress Notes (Signed)
Subjective:    Patient ID: Robert Cruz, male    DOB: [birth year],   MRN: [id number]  HPI  48 yobm with severe obesity baseline wt around 250 with pos PSG 2011 at wt 280 but never set up with cpap referred to pulmonary clinic by Dr Leitha Schuller 12/14/2015 for osa re-eval.  12/14/2015 1st Robert Cruz   Chief Complaint  Patient presents with  . Sleep Consult    Referred by Dr. Sanda Linger. Pt states he was dxed with OSA on 2011- never started CPAP. He has had 40 lb wt gain in the past 2 yrs. He feels ready to start on CPAP therapy.     Sleep Questionnaire:  What time do you typically go to bed?   10p   How long does it take you to fall asleep?  1 h  How many times during the night do you wake up?   2-3 What time do you get out of bed to start your day?  6am Do you drive or operate heavy machinery in your occupation?   yes  How much has your weight changed (up or down) over the past two years?   Up by 40 lbs  Have you ever had a sleep study before?   yes    If yes, location of study?   [address]   If yes, date of study?   2011  Do you currently use CPAP?   no If so, what pressure?   n/a   Do you wear oxygen at any time?  no Epworth score   6  No am ha but energy is low chronically. 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 No unusual exp hx or h/o childhood pna/ asthma or knowledge of premature birth.  Sleeping ok without nocturnal  or early am exacerbation  of respiratory  c/o's or need for noct saba. Also denies any obvious fluctuation of symptoms with weather or environmental changes or other aggravating or alleviating factors except as outlined above   Current Medications, Allergies, Complete Past Medical History, Past Surgical History, Family History, and Social History were reviewed in [employer] record.               Review of Systems  Constitutional: Negative for fever, chills, activity change, appetite change and unexpected weight change.  HENT: Negative for congestion, dental problem, postnasal drip, rhinorrhea, sneezing, sore throat, trouble swallowing and voice change.   Eyes: Negative for visual disturbance.  Respiratory: Negative for cough, choking and shortness of breath.   Cardiovascular: Negative for chest pain and leg swelling.  Gastrointestinal: Negative for nausea, vomiting and abdominal pain.  Genitourinary: Negative for difficulty urinating.  Musculoskeletal: Negative for arthralgias.  Skin: Negative for rash.  Psychiatric/Behavioral: Negative for behavioral problems and confusion.       Objective:   Physical Exam  amb slt hoarse obese bm nad   Wt Readings from Last 3 Encounters:  12/14/15 353 lb (160.12 kg)  12/02/15 354 lb (160.573 kg)  10/20/15 353 lb (160.12 kg)    Vital signs reviewed   HEENT: nl dentition, turbinates, and oropharynx. Nl external ear canals without cough reflex Modified Mallampati Score = 1/2     NECK :  without JVD/Nodes/TM/ nl carotid upstrokes bilaterally  LUNGS: no acc muscle use,  Nl contour chest which is clear to A and P bilaterally without cough on insp or exp maneuvers   CV:  RRR  no s3 or murmur or increase in P2,  Trace - 1 Plus pitting bilateral lower  edema   ABD:  soft and nontender with nl inspiratory excursion in the supine position. No bruits or organomegaly, bowel sounds nl  MS:  Nl gait/ ext warm without deformities, calf tenderness, cyanosis or clubbing No obvious joint restrictions   SKIN: warm and dry without lesions    NEURO:  alert, approp, nl sensorium with  no motor deficits      I personally reviewed images and agree with radiology impression as follows:  CXR:  08/25/15  No acute cardiopulmonary process.       Assessment & Plan:

## 2015-12-15 ENCOUNTER — Encounter: Payer: Self-pay | Admitting: Internal Medicine

## 2015-12-15 NOTE — Assessment & Plan Note (Addendum)
Not Adequate control on present rx, reviewed > should improve with cpap >> no change in rx needed  For now > work on wt loss > Follow up per Primary Care planned

## 2015-12-15 NOTE — Assessment & Plan Note (Signed)
Suprisingly little hypersomnia but not hbp may be related and if Pos osa at < 40 lb clearly has it now  rec repeat split night study with mask of choice/ f/u sleep medicine

## 2015-12-15 NOTE — Assessment & Plan Note (Signed)
Body mass index is 45.3    Lab Results  Component Value Date   TSH 1.493 10/07/2013     Contributing to gerd tendency/ doe/reviewed the need and the process to achieve and maintain neg calorie balance > defer f/u primary care including intermittently monitoring thyroid status

## 2015-12-16 ENCOUNTER — Encounter: Payer: Self-pay | Admitting: Family

## 2015-12-16 ENCOUNTER — Ambulatory Visit (INDEPENDENT_AMBULATORY_CARE_PROVIDER_SITE_OTHER): Payer: Managed Care, Other (non HMO) | Admitting: Family

## 2015-12-16 VITALS — BP 160/80 | HR 76 | Temp 98.3°F | Resp 12 | Ht 74.0 in | Wt 349.0 lb

## 2015-12-16 DIAGNOSIS — I10 Essential (primary) hypertension: Secondary | ICD-10-CM

## 2015-12-16 DIAGNOSIS — R51 Headache: Secondary | ICD-10-CM

## 2015-12-16 DIAGNOSIS — R519 Headache, unspecified: Secondary | ICD-10-CM | POA: Insufficient documentation

## 2015-12-16 MED ORDER — METOPROLOL SUCCINATE ER 100 MG PO TB24: 100.0000 mg | ORAL_TABLET | Freq: Every day | ORAL | Status: DC

## 2015-12-16 MED ORDER — TRAMADOL HCL 50 MG PO TABS: 50.0000 mg | ORAL_TABLET | Freq: Three times a day (TID) | ORAL | Status: DC | PRN

## 2015-12-16 NOTE — Progress Notes (Signed)
Pre visit review using our clinic review tool, if applicable. No additional management support is needed unless otherwise documented below in the visit note. 

## 2015-12-16 NOTE — Patient Instructions (Signed)
Thank you for choosing [facility].  Summary/Instructions:  Your prescription(s) have been submitted to your pharmacy or been printed and provided for you. Please take as directed and contact our office if you believe you are having problem(s) with the medication(s) or have any questions.  If your symptoms worsen or fail to improve, please contact our office for further instruction, or in case of emergency go directly to the emergency room at the closest medical facility.   Please continue to take your medication as prescribed.  Use the Tramadol as needed as needed for headaches.   Continue to monitor your blood pressure at home.  Continue to work on weight loss!

## 2015-12-16 NOTE — Assessment & Plan Note (Signed)
Hypertension remains elevated with current regimen and above goal 140/90. Does report improvements in lower extremity edema and headaches, however continues to experience a different headache. Working with pulmonology for sleep. Increase metoprolol to 100 mg daily. Continue current dosage of lisinopril-hctz. Continue to monitor blood pressure at home. Follow up in 2 weeks.

## 2015-12-16 NOTE — Progress Notes (Signed)
Subjective:    Patient ID: Robert Cruz, male    DOB: [birth year], 40 y.o.   MRN: [id number]  Chief Complaint  Patient presents with  . Acute Visit    blood pressure has been elevated - headaches and swelling is down, but bp still high    HPI:  Robert Cruz is a 40 y.o. male who  has a past medical history of Hypertension; GERD (gastroesophageal reflux disease); Chicken pox; and Sleep apnea. and presents today for a follow-up office visit.  Hypertension - currently maintained on lisinopril-hydrochlorothiazide and metoprolol. Takes medication as prescribed and notes significant improvements in the headaches and swelling. Denies adverse side effects or hypotensive events.  BP Readings from Last 3 Encounters:  12/16/15 160/80  12/14/15 178/100  12/02/15 142/102   2.) Headache - Associated symptom of a headache has been waxing and waning for the past several weeks. Described as a pinching and constant feeling that is refractory to the modifying factors of OTC medications and Tylenol 3. Timing of symptoms can be worse at night. Denies worst headache of his life, neck stiffness or fevers.   Allergies  Allergen Reactions  . Amlodipine Other (See Comments)    edema    Current Outpatient Prescriptions on File Prior to Visit  Medication Sig Dispense Refill  . lisinopril-hydrochlorothiazide (ZESTORETIC) 20-12.5 MG tablet Take 2 tablets by mouth daily. 180 tablet 1  . [DISCONTINUED] lisinopril (PRINIVIL,ZESTRIL) 20 MG tablet Take 1 tablet (20 mg total) by mouth daily. 30 tablet 1   No current facility-administered medications on file prior to visit.    Review of Systems  Constitutional: Negative for fever and chills.  Eyes:       Negative for changes in vision.   Respiratory: Negative for chest tightness and shortness of breath.   Cardiovascular: Negative for chest pain, palpitations and leg swelling.  Neurological: Positive for headaches.      Objective:    BP 160/80 mmHg   Pulse 76  Temp(Src) 98.3 F (36.8 C) (Oral)  Resp 12  Ht 6\' 2"  (1.88 m)  Wt 349 lb (158.305 kg)  BMI 44.79 kg/m2  SpO2 98% Nursing note and vital signs reviewed.  Wt Readings from Last 3 Encounters:  12/16/15 349 lb (158.305 kg)  12/14/15 353 lb (160.12 kg)  12/02/15 354 lb (160.573 kg)    Physical Exam  Constitutional: He is oriented to person, place, and time. He appears well-developed and well-nourished. No distress.  HENT:  Right Ear: Hearing, tympanic membrane, external ear and ear canal normal.  Left Ear: Hearing, tympanic membrane, external ear and ear canal normal.  Nose: Nose normal.  Mouth/Throat: Uvula is midline, oropharynx is clear and moist and mucous membranes are normal.  Eyes: Conjunctivae are normal. Pupils are equal, round, and reactive to light.  Cardiovascular: Normal rate, regular rhythm, normal heart sounds and intact distal pulses.   Pulmonary/Chest: Effort normal and breath sounds normal.  Neurological: He is alert and oriented to person, place, and time. No cranial nerve deficit.  Skin: Skin is warm and dry.  Psychiatric: He has a normal mood and affect. His behavior is normal. Judgment and thought content normal.       Assessment & Plan:   Problem List Items Addressed This Visit      Cardiovascular and Mediastinum   Essential hypertension - Primary    Hypertension remains elevated with current regimen and above goal 140/90. Does report improvements in lower extremity edema and headaches, however continues to  experience a different headache. Working with pulmonology for sleep. Increase metoprolol to 100 mg daily. Continue current dosage of lisinopril-hctz. Continue to monitor blood pressure at home. Follow up in 2 weeks.       Relevant Medications   metoprolol succinate (TOPROL-XL) 100 MG 24 hr tablet     Other   Generalized headache    Symptoms consistent with generalized headache although cannot rule out potential tension type headache.  Neurological exam is benign. Start tramadol as needed for headaches. Consider Imitrex if no improvement with current medications. Cannot rule out decreasing blood pressure is causing the potential headaches. Follow-up if symptoms worsen or fail to improve or if worst headache of his life develops.      Relevant Medications   traMADol (ULTRAM) 50 MG tablet   metoprolol succinate (TOPROL-XL) 100 MG 24 hr tablet

## 2015-12-16 NOTE — Assessment & Plan Note (Signed)
Symptoms consistent with generalized headache although cannot rule out potential tension type headache. Neurological exam is benign. Start tramadol as needed for headaches. Consider Imitrex if no improvement with current medications. Cannot rule out decreasing blood pressure is causing the potential headaches. Follow-up if symptoms worsen or fail to improve or if worst headache of his life develops.

## 2015-12-17 ENCOUNTER — Telehealth: Payer: Self-pay | Admitting: Family

## 2015-12-17 DIAGNOSIS — R51 Headache: Principal | ICD-10-CM

## 2015-12-17 DIAGNOSIS — R519 Headache, unspecified: Secondary | ICD-10-CM

## 2015-12-17 NOTE — Telephone Encounter (Signed)
Pt was prescribed tramadol yesterday and it is not doing anything. He's wondering if it is ok to double the medication. Please give him a call

## 2015-12-17 NOTE — Telephone Encounter (Signed)
Please advise 

## 2015-12-18 ENCOUNTER — Ambulatory Visit: Payer: Managed Care, Other (non HMO) | Admitting: Family

## 2015-12-21 NOTE — Telephone Encounter (Signed)
LVM letting pt know.  

## 2015-12-21 NOTE — Telephone Encounter (Signed)
Yes, it is okay to double the medication. No more that 5 pills in 1 day.

## 2015-12-23 NOTE — Telephone Encounter (Signed)
Please advise 

## 2015-12-23 NOTE — Telephone Encounter (Signed)
I have sent a referral to neurology for the headaches.

## 2015-12-23 NOTE — Telephone Encounter (Signed)
Patient has called back.  States that tramadol is not working at all.  Would like to know what to do next.  Please call [phone]

## 2015-12-23 NOTE — Addendum Note (Signed)
Addended by: Jeanine Luz D on: 12/23/2015 10:38 PM   Modules accepted: Orders

## 2015-12-24 NOTE — Telephone Encounter (Signed)
Pt is aware.  

## 2016-01-04 ENCOUNTER — Encounter: Payer: Self-pay | Admitting: Family

## 2016-01-04 ENCOUNTER — Ambulatory Visit (INDEPENDENT_AMBULATORY_CARE_PROVIDER_SITE_OTHER): Payer: Managed Care, Other (non HMO) | Admitting: Family

## 2016-01-04 VITALS — BP 180/110 | HR 72 | Temp 97.5°F | Resp 18 | Ht 74.0 in | Wt 354.0 lb

## 2016-01-04 DIAGNOSIS — I10 Essential (primary) hypertension: Secondary | ICD-10-CM

## 2016-01-04 MED ORDER — CLONIDINE HCL 0.1 MG PO TABS: 0.1000 mg | ORAL_TABLET | Freq: Three times a day (TID) | ORAL | Status: DC

## 2016-01-04 NOTE — Assessment & Plan Note (Signed)
Resistant hypertension and above goal of 140/90 with current regimen. Denies side effects. Obtain renal ultrasound to rule out renal artery stenosis. Consider 24 hour urine collection for catecholamines. Refer to cardiology for assistance with management. Start Clonidine. Continue current dosage of metoprolol and lisinopril-hydrochlorothiazide. Encouraged weight loss through nutrition and physical activity. Monitor blood pressure at home as able. Follow up pending ultrasound results.

## 2016-01-04 NOTE — Patient Instructions (Signed)
Thank you for choosing [facility].  Summary/Instructions:  Your prescription(s) have been submitted to your pharmacy or been printed and provided for you. Please take as directed and contact our office if you believe you are having problem(s) with the medication(s) or have any questions.  If your symptoms worsen or fail to improve, please contact our office for further instruction, or in case of emergency go directly to the emergency room at the closest medical facility.   Please continue to take your medication as prescribed.  Start taking the Clonidine.  They will call to schedule an ultrasound and your cardiology appointment.

## 2016-01-04 NOTE — Progress Notes (Signed)
Subjective:    Patient ID: Robert Cruz, male    DOB: 26-Nov-1976, 40 y.o.   MRN: [id number]  Chief Complaint  Patient presents with  . Follow-up    Blood pressure follow up    HPI:  Robert Cruz is a 40 y.o. male who  has a past medical history of Hypertension; GERD (gastroesophageal reflux disease); Chicken pox; and Sleep apnea. and presents today for a follow up.  Hypertension - Currently maintained on lisinopril-hydrochlorothiazide and metoprolol. Takes the medication as prescribed and denies adverse side effects. Has worked on improving his eating habits and does not currently check his blood pressure at home. Continues to await a sleep study. Does have headaches with no other symptoms of end organ damage.   BP Readings from Last 3 Encounters:  01/04/16 180/110  12/16/15 160/80  12/14/15 178/100    Allergies  Allergen Reactions  . Amlodipine Other (See Comments)    edema     Current Outpatient Prescriptions on File Prior to Visit  Medication Sig Dispense Refill  . lisinopril-hydrochlorothiazide (ZESTORETIC) 20-12.5 MG tablet Take 2 tablets by mouth daily. 180 tablet 1  . metoprolol succinate (TOPROL-XL) 100 MG 24 hr tablet Take 1 tablet (100 mg total) by mouth daily. Take with or immediately following a meal. 30 tablet 0  . [DISCONTINUED] lisinopril (PRINIVIL,ZESTRIL) 20 MG tablet Take 1 tablet (20 mg total) by mouth daily. 30 tablet 1   No current facility-administered medications on file prior to visit.    Review of Systems  Eyes:       Negative for changes in vision.  Respiratory: Negative for chest tightness and shortness of breath.   Cardiovascular: Negative for chest pain, palpitations and leg swelling.  Neurological: Positive for headaches. Negative for weakness and numbness.      Objective:    BP 180/110 mmHg  Pulse 72  Temp(Src) 97.5 F (36.4 C) (Oral)  Resp 18  Ht  (1.88 m)  Wt 354 lb (160.573 kg)  BMI 45.43 kg/m2  SpO2 97% Nursing  note and vital signs reviewed.  Physical Exam  Constitutional: He is oriented to person, place, and time. He appears well-developed and well-nourished. No distress.  Cardiovascular: Normal rate, regular rhythm, normal heart sounds and intact distal pulses.   Pulmonary/Chest: Effort normal and breath sounds normal.  Neurological: He is alert and oriented to person, place, and time.  Skin: Skin is warm and dry.  Psychiatric: He has a normal mood and affect. His behavior is normal. Judgment and thought content normal.       Assessment & Plan:   Problem List Items Addressed This Visit      Cardiovascular and Mediastinum   Essential hypertension   Relevant Medications   cloNIDine (CATAPRES) 0.1 MG tablet   Other Relevant Orders   US Renal Artery Stenosis   Ambulatory referral to Cardiology   Resistant hypertension - Primary    Resistant hypertension and above goal of 140/90 with current regimen. Denies side effects. Obtain renal ultrasound to rule out renal artery stenosis. Consider 24 hour urine collection for catecholamines. Refer to cardiology for assistance with management. Start Clonidine. Continue current dosage of metoprolol and lisinopril-hydrochlorothiazide. Encouraged weight loss through nutrition and physical activity. Monitor blood pressure at home as able. Follow up pending ultrasound results.       Relevant Medications   cloNIDine (CATAPRES) 0.1 MG tablet   Other Relevant Orders   US Renal Artery Stenosis   Ambulatory referral to Cardiology

## 2016-01-04 NOTE — Progress Notes (Signed)
Pre visit review using our clinic review tool, if applicable. No additional management support is needed unless otherwise documented below in the visit note. 

## 2016-01-07 ENCOUNTER — Encounter: Payer: Self-pay | Admitting: Cardiovascular Disease

## 2016-01-07 ENCOUNTER — Ambulatory Visit (HOSPITAL_COMMUNITY)
Admission: RE | Admit: 2016-01-07 | Discharge: 2016-01-07 | Disposition: A | Discharge status: Home / Self Care | Payer: Managed Care, Other (non HMO) | Source: Ambulatory Visit | Attending: Internal Medicine | Admitting: Internal Medicine

## 2016-01-07 ENCOUNTER — Ambulatory Visit (INDEPENDENT_AMBULATORY_CARE_PROVIDER_SITE_OTHER): Payer: Managed Care, Other (non HMO) | Admitting: Cardiovascular Disease

## 2016-01-07 VITALS — BP 146/92 | HR 84 | Ht 74.0 in | Wt 353.0 lb

## 2016-01-07 DIAGNOSIS — R9431 Abnormal electrocardiogram [ECG] [EKG]: Secondary | ICD-10-CM | POA: Diagnosis not present

## 2016-01-07 DIAGNOSIS — I10 Essential (primary) hypertension: Secondary | ICD-10-CM

## 2016-01-07 MED ORDER — CARVEDILOL 25 MG PO TABS: 25.0000 mg | ORAL_TABLET | Freq: Two times a day (BID) | ORAL | Status: DC

## 2016-01-07 NOTE — Patient Instructions (Addendum)
   For your  leg edema you  should do  the following 1. Leg elevation - I recommend the Lounge Dr. Leg rest.  See below for details  2. Salt restriction  -  Use potassium chloride instead of regular salt as a salt substitute. 3. Walk regularly 4. Compression hose - [facility] 5. Weight loss     Go to [email or web address]      Medication Instructions:  Stop taking Metoprolol and start taking Carvedilol 25 mg twice daily. All other medications remain the same.  Labwork: None  Testing/Procedures: Your physician has requested that you have an echocardiogram. Echocardiography is a painless test that uses sound waves to create images of your heart. It provides your doctor with information about the size and shape of your heart and how well your heart's chambers and valves are working. This procedure takes approximately one hour. There are no restrictions for this procedure.    Follow-Up: Your physician recommends that you schedule a follow-up appointment in: 2 to 3 months.     If you need a refill on your cardiac medications before your next appointment, please call your pharmacy.

## 2016-01-07 NOTE — Progress Notes (Signed)
Cardiology Office Note   Date:  01/07/2016   ID:  Robert Cruz, DOB 04-Dec-1976, MRN [id number]  PCP:  Jeanine Luz, FNP  Cardiologist:   Vesta Mixer, MD   Chief Complaint  Patient presents with  . Hypertension   Problem List 1. Essential HTN 2. Obstructive sleep apnea 3, GERD    History of Present Illness: Robert Cruz is a 40 y.o. male who presents for evaluation of his HTN He has been on multiple meds with minimal response  Very busy at work .    Not a lot of time to exercise  Has been on a better diet for the past 1 year  or so.  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  No DOE doing fairly moderate exercise ( ie no dyspnea walking up the stadium steps at the [employer] a month ago )  Has not lost any weight on his new diet.   Typically break fast - eggs, [place] sausage /turkey bacon  Lunch - subway - 6 inch or salad   - used to eat lots of fried foods, [employer], chick fil a  Dinner - spag. Occasionally take out from [employer]        Past Medical History  Diagnosis Date  . Hypertension   . GERD (gastroesophageal reflux disease)   . Chicken pox   . Sleep apnea     Past Surgical History  Procedure Laterality Date  . Unremarkable.       Current Outpatient Prescriptions  Medication Sig Dispense Refill  . cloNIDine (CATAPRES) 0.1 MG tablet Take 1 tablet (0.1 mg total) by mouth 3 (three) times daily. 60 tablet 1  . lisinopril-hydrochlorothiazide (ZESTORETIC) 20-12.5 MG tablet Take 2 tablets by mouth daily. 180 tablet 1  . metoprolol succinate (TOPROL-XL) 100 MG 24 hr tablet Take 1 tablet (100 mg total) by mouth daily. Take with or immediately following a meal. 30 tablet 0  . [DISCONTINUED] lisinopril (PRINIVIL,ZESTRIL) 20 MG tablet Take 1 tablet (20 mg total) by mouth daily. 30 tablet 1   No current facility-administered medications  for this visit.    Allergies:   Amlodipine    Social History:  The patient  reports that he has never smoked. He has never used smokeless tobacco. He reports that he drinks alcohol. He reports that he does not use illicit drugs.   Family History:  The patient's family history includes Depression in his father; Healthy in his brother and sister; Hypertension in his father, maternal grandmother, and mother; Kidney disease in his maternal grandmother. There is no history of Colon cancer, Esophageal cancer, Rectal cancer, or Stomach cancer.    ROS:  Please see the history of present illness.    Review of Systems: Constitutional:  denies fever, chills, diaphoresis, appetite change and fatigue.  HEENT: denies photophobia, eye pain, redness, hearing loss, ear pain, congestion, sore throat, rhinorrhea, sneezing, neck pain, neck stiffness and tinnitus.  Respiratory: denies SOB, DOE, cough, chest tightness, and wheezing.  Cardiovascular: denies chest pain, palpitations and leg swelling.  Gastrointestinal: denies nausea, vomiting, abdominal pain, diarrhea, constipation, blood in stool.  Genitourinary: denies dysuria, urgency, frequency, hematuria, flank pain and difficulty urinating.  Musculoskeletal: denies  myalgias, back pain, joint swelling, arthralgias and gait problem.   Skin: denies pallor, rash and wound.  Neurological: denies  dizziness, seizures, syncope, weakness, light-headedness, numbness and headaches.   Hematological: denies adenopathy, easy bruising, personal or family bleeding history.  Psychiatric/ Behavioral: denies suicidal ideation, mood changes, confusion, nervousness, sleep disturbance and agitation.       All other systems are reviewed and negative.    PHYSICAL EXAM: VS:  BP 146/92 mmHg  Pulse 84  Ht  (1.88 m)  Wt 353 lb (160.12 kg)  BMI 45.30 kg/m2 , BMI Body mass index is 45.3 kg/(m^2). GEN: Well nourished, well developed, in no acute distress HEENT:  normal Neck: no JVD, carotid bruits, or masses Cardiac: RRR; no murmurs, rubs, or gallops,no edema  Respiratory:  clear to auscultation bilaterally, normal work of breathing GI: soft, nontender, nondistended, + BS MS: no deformity or atrophy Skin: warm and dry, no rash Neuro:  Strength and sensation are intact Psych: normal   EKG:  EKG is ordered today. The ekg ordered 10/10/15 demonstrates NSR , NS ST abn.    Recent Labs: 10/19/2015: BUN 12; Creatinine, Ser 1.02; Hemoglobin 12.8*; Platelets 311; Potassium 3.2*; Sodium 138    Lipid Panel    Component Value Date/Time   CHOL 168 10/07/2013 0817   TRIG 72 10/07/2013 0817   HDL 38* 10/07/2013 0817   CHOLHDL 4.4 10/07/2013 0817   VLDL 14 10/07/2013 0817   LDLCALC 116* 10/07/2013 0817      Wt Readings from Last 3 Encounters:  01/07/16 353 lb (160.12 kg)  01/04/16 354 lb (160.573 kg)  12/16/15 349 lb (158.305 kg)      Other studies Reviewed: Additional studies/ records that were reviewed today include: . Review of the above records demonstrates:    ASSESSMENT AND PLAN:  1.  Essential HTN Weight loss - we have discussed the importance of weight loss. He's already  working on a good diet Salt restriction - we discussed avoiding [place] bacon and [place] sausage and eating regular chicken or [place] instead. The [place] bacon has significant preservatives. I've recommended that he buy a [employer] BP cuff and keep a blood pressure log. We will discontinue the metoprolol and try carvedilol 25 mg twice a day.  2. Leg edema  I've encouraged Leg elevation. I've given him instructions on the lounge Dr. leg rest. Compression hose Exercise - encouraged him to exercise on a regular basis.  3. Abn ECG Echo - we'll get an echocardiogram since his EKG is mildly abnormal. Likely is LVH with repol  4. Obstructive sleep apnea He's been seen by Dr. Sherene Sires  Sleep study has been scheduled   Current medicines are reviewed at length with the  patient today.  The patient does not have concerns regarding medicines.  The following changes have been made:  We'll discontinue metoprolol and try carvedilol 25 mg twice a day.  Labs/ tests ordered today include:  No orders of the defined types were placed in this encounter.     Disposition:   FU with me in 2-3 months     Rolande Moe, Deloris Ping, MD  01/07/2016 8:55 AM    [facility] [address] [place], [place], [place]  [address] Phone: [phone]; Fax: [phone]   [facility]  [address] [place], [place]  [address] [phone]   Fax [phone]

## 2016-01-08 ENCOUNTER — Telehealth: Payer: Self-pay | Admitting: Cardiovascular Disease

## 2016-01-08 NOTE — Telephone Encounter (Signed)
Follow Up ° °Pt is returning the call  °

## 2016-01-08 NOTE — Telephone Encounter (Signed)
Notified of echo results.

## 2016-01-14 ENCOUNTER — Ambulatory Visit (INDEPENDENT_AMBULATORY_CARE_PROVIDER_SITE_OTHER): Payer: Managed Care, Other (non HMO) | Admitting: Neurology

## 2016-01-14 ENCOUNTER — Encounter: Payer: Self-pay | Admitting: Neurology

## 2016-01-14 VITALS — BP 170/88 | HR 78 | Ht 74.0 in | Wt 353.0 lb

## 2016-01-14 DIAGNOSIS — G43709 Chronic migraine without aura, not intractable, without status migrainosus: Secondary | ICD-10-CM | POA: Diagnosis not present

## 2016-01-14 DIAGNOSIS — G4441 Drug-induced headache, not elsewhere classified, intractable: Secondary | ICD-10-CM | POA: Diagnosis not present

## 2016-01-14 DIAGNOSIS — I10 Essential (primary) hypertension: Secondary | ICD-10-CM | POA: Diagnosis not present

## 2016-01-14 DIAGNOSIS — G4440 Drug-induced headache, not elsewhere classified, not intractable: Secondary | ICD-10-CM

## 2016-01-14 DIAGNOSIS — G4733 Obstructive sleep apnea (adult) (pediatric): Secondary | ICD-10-CM

## 2016-01-14 NOTE — Progress Notes (Addendum)
NEUROLOGY CONSULTATION NOTE  Robert Cruz MRN: [id number] DOB: 12-15-1975  Referring provider: Jeanine Luz, FNP Primary care provider: Jeanine Luz, FNP  Reason for consult:  headache  HISTORY OF PRESENT ILLNESS: Robert Cruz is a 40 year old right-handed male with hypertension and OSA who presents for headache.  History obtained by patient, his wife and PCP note.  Echo report, EKG and imaging of head CT reviewed.  He has longstanding history of headaches that have become more frequent over the past year.  Over the past year, he has struggled with uncontrolled hypertension.  Recent EKG showed nonspecific ST abnormalities.  2D echo showed LV EF 55-60% with mildly abnormal early diastolic septal annular tissue Doppler velocities.  He has had several changes to his medications.  At first, he thought the headaches occurred when his blood pressure was extremely elevated.  The headaches are usually right sided at the back and side of head.  It is a throbbing pain.  It is usually 3-8/10.  It is associated with photophobia and phonophobia.  It usually lasts until he takes a pain reliever.  It occurs daily, but severe headaches occur once or twice a week.   Current pain relievers include:  Excedrin Migraine and Tramadol Past pain relievers include:  Ibuprofen, Tylenol #3, Goodys He has been taking pain relievers daily for several months. He is scheduled to change metoprolol to Coreg.  CT of head from 10/19/15 was normal.  He does not exercise routinely.  He stopped drinking soda but does not keep hydrated with water.  He cut down on high sodium meats, such as [place] bacon, but still eats chips.  He has previously been diagnosed with OSA, which is untreated.  He has an upcoming sleep study. His mother has headaches.  There is no family history of aneurysms.  PAST MEDICAL HISTORY: Past Medical History  Diagnosis Date  . Hypertension   . GERD (gastroesophageal reflux disease)   .  Chicken pox   . Sleep apnea     PAST SURGICAL HISTORY: Past Surgical History  Procedure Laterality Date  . Unremarkable.      MEDICATIONS: Current Outpatient Prescriptions on File Prior to Visit  Medication Sig Dispense Refill  . cloNIDine (CATAPRES) 0.1 MG tablet Take 1 tablet (0.1 mg total) by mouth 3 (three) times daily. 60 tablet 1  . lisinopril-hydrochlorothiazide (ZESTORETIC) 20-12.5 MG tablet Take 2 tablets by mouth daily. 180 tablet 1  . carvedilol (COREG) 25 MG tablet Take 1 tablet (25 mg total) by mouth 2 (two) times daily. (Patient not taking: Reported on 01/14/2016) 60 tablet 3  . [DISCONTINUED] lisinopril (PRINIVIL,ZESTRIL) 20 MG tablet Take 1 tablet (20 mg total) by mouth daily. 30 tablet 1   No current facility-administered medications on file prior to visit.    ALLERGIES: Allergies  Allergen Reactions  . Amlodipine Other (See Comments)    edema    FAMILY HISTORY: Family History  Problem Relation Age of Onset  . Hypertension Father   . Depression Father   . Hypertension Mother   . Kidney disease Maternal Grandmother     Dialysis  . Hypertension Maternal Grandmother   . Healthy Brother     x1  . Healthy Sister     x3  . Colon cancer Neg Hx   . Esophageal cancer Neg Hx   . Rectal cancer Neg Hx   . Stomach cancer Neg Hx     SOCIAL HISTORY: Social History   Social History  . Marital  Status: Divorced    Spouse Name: N/A  . Number of Children: 3  . Years of Education: N/A   Occupational History  . MIXER    Social History Main Topics  . Smoking status: Never Smoker   . Smokeless tobacco: Never Used  . Alcohol Use: 0.0 oz/week     Comment: occasional  . Drug Use: No  . Sexual Activity: Not on file   Other Topics Concern  . Not on file   Social History Narrative    REVIEW OF SYSTEMS: Constitutional: No fevers, chills, or sweats, no generalized fatigue, change in appetite Eyes: No visual changes, double vision, eye pain Ear, nose and  throat: No hearing loss, ear pain, nasal congestion, sore throat Cardiovascular: No chest pain, palpitations Respiratory:  No shortness of breath at rest or with exertion, wheezes GastrointestinaI: No nausea, vomiting, diarrhea, abdominal pain, fecal incontinence Genitourinary:  No dysuria, urinary retention or frequency Musculoskeletal:  No neck pain, back pain Integumentary: No rash, pruritus, skin lesions Neurological: as above Psychiatric: No depression, insomnia, anxiety Endocrine: No palpitations, fatigue, diaphoresis, mood swings, change in appetite, change in weight, increased thirst Hematologic/Lymphatic:  No anemia, purpura, petechiae. Allergic/Immunologic: no itchy/runny eyes, nasal congestion, recent allergic reactions, rashes  PHYSICAL EXAM: Filed Vitals:   01/14/16 1326  BP: 170/88  Pulse: 78   General: No acute distress.  Patient appears well-groomed.  Head:  Normocephalic/atraumatic Eyes:  fundi unremarkable, without vessel changes, exudates, hemorrhages or papilledema. Neck: supple, no paraspinal tenderness, full range of motion Back: No paraspinal tenderness Heart: regular rate and rhythm Lungs: Clear to auscultation bilaterally. Vascular: No carotid bruits. Neurological Exam: Mental status: alert and oriented to person, place, and time, recent and remote memory intact, fund of knowledge intact, attention and concentration intact, speech fluent and not dysarthric, language intact. Cranial nerves: CN I: not tested CN II: pupils equal, round and reactive to light, visual fields intact, fundi unremarkable, without vessel changes, exudates, hemorrhages or papilledema. CN III, IV, VI:  full range of motion, no nystagmus, no ptosis CN V: facial sensation intact CN VII: upper and lower face symmetric CN VIII: hearing intact CN IX, X: gag intact, uvula midline CN XI: sternocleidomastoid and trapezius muscles intact CN XII: tongue midline Bulk & Tone: normal, no  fasciculations. Motor:  5/5 throughout Sensation:  Pinprick and vibration sensation intact. Deep Tendon Reflexes:  2+ throughout, toes downgoing.  Finger to nose testing:  Without dysmetria.  Heel to shin:  Without dysmetria.  Gait:  Normal station and stride.  Able to turn and tandem walk. Romberg negative.  IMPRESSION: Chronic migraine complicated by medication-overuse.  Unfortunately, with uncontrolled hypertension, several abortive medications are contraindicated, such as triptans.  Also, NSAIDs may increase blood pressure as well. Uncontrolled hypertension Morbid obesity OSA  PLAN: 1.  Will check with Dr. Eden Emms to see if antihypertensive can be switched to either propranolol or atenolol instead, as I find these beta blockers more effective in treating headaches.  ADDENDUM:  As per Dr. Eden Emms, he may start atenolol  daily instead of Coreg. 2.  Limit use of Excedrin to no more than 2 days out of the week 3.  Set up management for OSA with CPAP ASAP 4.  Blood pressure control, following up with cardiology 5.  Weight loss 6.  Lifestyle modification:  Diet, exercise, supplements (magnesium, riboflavin, coenzyme Q10) 7.  Follow up in 3 to 4 months  Thank you for allowing me to take part in the care of this patient.  Shon Millet, DO  CC:  Jeanine Luz, NP  Charlton Haws, MD

## 2016-01-14 NOTE — Patient Instructions (Signed)
Migraine Recommendations: 1.  I will check with the cardiologist to see if we can start you on a different blood pressure medication that will treat headache as well as blood pressure. 2.  Take tramadol  with acetaminophen  at earliest onset of headache.  May repeat dose in 6 hours if needed. 3.  Limit use of pain relievers to no more than 2 days out of the week.  These medications include acetaminophen, ibuprofen, triptans and narcotics.  This will help reduce risk of rebound headaches. 4.  Be aware of common food triggers such as processed sweets, processed foods with nitrites (such as deli meat, hot dogs, sausages), foods with MSG, alcohol (such as wine), chocolate, certain cheeses, certain fruits (dried fruits, some citrus fruit), vinegar, diet soda. 4.  Avoid caffeine 5.  Routine exercise 6.  Proper sleep hygiene 7.  Stay adequately hydrated with water 8.  Keep a headache diary. 9.  Maintain proper stress management. 10.  Do not skip meals. 11.  Consider supplements:  Magnesium oxide  to  daily, riboflavin , Coenzyme Q 10  three times daily 12.  Follow up with cardiologist regarding blood pressure

## 2016-01-15 ENCOUNTER — Telehealth: Payer: Self-pay

## 2016-01-15 MED ORDER — ATENOLOL 100 MG PO TABS
100.0000 mg | ORAL_TABLET | Freq: Every day | ORAL | Status: DC
Start: 2016-01-15 — End: 2016-07-20

## 2016-01-15 NOTE — Telephone Encounter (Signed)
-----   Message from Drema Dallas, DO sent at 01/15/2016  7:14 AM EST ----- Mel Almond, let Mr. Petrosky know that Dr. Eden Emms said he may start atenolol  daily INSTEAD OF THE COREG.  I sent a prescription for the atenolol to [facility].  So we are switching from metoprolol to atenolol (not to Coreg)

## 2016-01-15 NOTE — Telephone Encounter (Signed)
Left message for pt. Med list updated.

## 2016-01-15 NOTE — Addendum Note (Signed)
Addended byEverlena Cooper, Asucena Galer R on: 01/15/2016 07:14 AM   Modules accepted: Orders

## 2016-02-05 ENCOUNTER — Ambulatory Visit (HOSPITAL_BASED_OUTPATIENT_CLINIC_OR_DEPARTMENT_OTHER): Payer: Managed Care, Other (non HMO) | Attending: Internal Medicine | Admitting: Radiology

## 2016-02-05 VITALS — Ht 74.0 in | Wt 320.0 lb

## 2016-02-05 DIAGNOSIS — G4730 Sleep apnea, unspecified: Secondary | ICD-10-CM | POA: Diagnosis present

## 2016-02-05 DIAGNOSIS — G4733 Obstructive sleep apnea (adult) (pediatric): Secondary | ICD-10-CM | POA: Insufficient documentation

## 2016-02-08 DIAGNOSIS — G4733 Obstructive sleep apnea (adult) (pediatric): Secondary | ICD-10-CM | POA: Diagnosis not present

## 2016-02-08 NOTE — Progress Notes (Signed)
Patient Name: Robert Cruz, Robert Cruz Date: 02/05/2016 Gender: Male D.O.B: [birth year] Age (years): 36 Referring Provider: Tanda Rockers Height (inches): 57 Interpreting Physician: Chesley Mires MD, ABSM Weight (lbs): 320 RPSGT: Zadie Rhine BMI: 41 MRN: [id number] Neck Size: 19.00  CLINICAL INFORMATION Sleep Study Type: Split Night CPAP   Indication for sleep study: History of sleep apnea.  Has weight gain since original study.  Re-assessing status of sleep apnea.   Epworth Sleepiness Score: 6.  SLEEP STUDY TECHNIQUE As per the AASM Manual for the Scoring of Sleep and Associated Events v2.3 (April 2016) with a hypopnea requiring 4% desaturations. The channels recorded and monitored were frontal, central and occipital EEG, electrooculogram (EOG), submentalis EMG (chin), nasal and oral airflow, thoracic and abdominal wall motion, anterior tibialis EMG, snore microphone, electrocardiogram, and pulse oximetry. Continuous positive airway pressure (CPAP) was initiated when the patient met split night criteria and was titrated according to treat sleep-disordered breathing.  MEDICATIONS Medications taken by the patient : reviewed in electronic medical record. Medications administered by patient during sleep study : No sleep medicine administered.  RESPIRATORY PARAMETERS Diagnostic Total AHI (/hr): 33.3 RDI (/hr): 37.2 OA Index (/hr): 13.1 CA Index (/hr): 0.8 REM AHI (/hr): 68.6 NREM AHI (/hr): 30.6 Supine AHI (/hr): 81.6 Non-supine AHI (/hr): 10.49 Min O2 Sat (%): 73.00 Mean O2 (%): 94.79 Time below 88% (min): 4.2   Titration Optimal Pressure (cm): 12 AHI at Optimal Pressure (/hr): 0.6 Min O2 at Optimal Pressure (%): 90.0 Supine % at Optimal (%): 0 Sleep % at Optimal (%): 94    SLEEP ARCHITECTURE The recording time for the entire night was 364.3 minutes. During a baseline period of 167.8 minutes, the patient slept for 151.4 minutes in REM and nonREM, yielding a sleep efficiency of 90.3%.  Sleep onset after lights out was 1.8 minutes with a REM latency of 79.0 minutes. The patient spent 4.62% of the night in stage N1 sleep, 88.44% in stage N2 sleep, 0.00% in stage N3 and 6.93% in REM. During the titration period of 192.7 minutes, the patient slept for 152.5 minutes in REM and nonREM, yielding a sleep efficiency of 79.1%. Sleep onset after CPAP initiation was 24.8 minutes with a REM latency of 42.5 minutes. The patient spent 9.18% of the night in stage N1 sleep, 53.77% in stage N2 sleep, 0.00% in stage N3 and 37.05% in REM.  CARDIAC DATA The 2 lead EKG demonstrated sinus rhythm. The mean heart rate was 59.25 beats per minute. Other EKG findings include: None.  LEG MOVEMENT DATA The total Periodic Limb Movements of Sleep (PLMS) were 0. The PLMS index was 0.00 .  IMPRESSIONS This study showed severe obstructive sleep apnea with an AHI of 33 and SpO2 low of 73%.    He did well with CPAP 12 cm H2O.  DIAGNOSIS - Obstructive Sleep Apnea (327.23 [G47.33 ICD-10])  RECOMMENDATIONS - Trial of CPAP therapy on 12 cm H2O with a Medium size Resmed Full Face Mask AirFit F20 mask and heated humidification. - Avoid alcohol, sedatives and other CNS depressants that may worsen sleep apnea and disrupt normal sleep architecture. - Sleep hygiene should be reviewed to assess factors that may improve sleep quality. - Weight management and regular exercise should be initiated or continued.   Chesley Mires, MD, [place], American Board of Sleep Medicine 02/08/2016, 3:07 PM  NPI: [id number]

## 2016-02-17 ENCOUNTER — Other Ambulatory Visit: Payer: Self-pay | Admitting: Family

## 2016-02-17 ENCOUNTER — Telehealth: Payer: Self-pay | Admitting: Internal Medicine

## 2016-02-17 DIAGNOSIS — G4733 Obstructive sleep apnea (adult) (pediatric): Secondary | ICD-10-CM

## 2016-02-17 NOTE — Telephone Encounter (Signed)
Last ov with MW on 12/14/15 Patient Instructions       Please see patient coordinator before you leave today  to schedule split night study/ mask of choice and we will set you up with whichever doctor reads your study for follow up on the best fit for you   Weight control is simply a matter of calorie balance which needs to be tilted in your favor by eating less and exercising more.  To get the most out of exercise, you need to be continuously aware that you are short of breath, but never out of breath, for 30 minutes daily. As you improve, it will actually be easier for you to do the same amount of exercise  in  30 minutes so always push to the level where you are short of breath.  If this does not result in gradual weight reduction then I strongly recommend you see a nutritionist with a food diary x 2 weeks so that we can work out a negative calorie balance which is universally effective in steady weight loss programs.  Think of your calorie balance like you do your bank account where in this case you want the balance to go down so you must take in less calories than you burn up.  It's just that simple:  Hard to do, but easy to understand.  Good luck!        Called and spoke with pt. Pt is requesting his sleep study results. Pt states that the sleep clinic informed him that he would need a CPAP and that MW would read the results over last weekend. I explained to him that we have not received MW's recs but once we have we will re turn his call. He voiced understanding and had no further questions.  MW please advise on sleep study.

## 2016-02-17 NOTE — Telephone Encounter (Signed)
Dr Craige CottaSood eval and dx mod severe osa so rec  Trial of CPAP therapy on 12 cm H2O with a Medium size Resmed Full Face Mask AirFit F20 mask and heated humidification. - Avoid alcohol, sedatives and other CNS depressants that may worsen sleep apnea and disrupt normal sleep architecture. - Sleep hygiene should be reviewed to assess factors that may improve sleep quality. - Weight management and regular exercise should be initiated or continued.  Needs set up with Dr Craige CottaSood next avail to establish with sleep doctor and call me in meatime prn

## 2016-02-17 NOTE — Telephone Encounter (Signed)
Spoke with pt, aware of results/recs. cpap order placed, scheduled consult with sleep doc.  Nothing further needed.

## 2016-02-22 ENCOUNTER — Other Ambulatory Visit: Payer: Self-pay | Admitting: Family

## 2016-02-23 ENCOUNTER — Telehealth: Payer: Self-pay | Admitting: Internal Medicine

## 2016-02-23 NOTE — Telephone Encounter (Signed)
After 5:00, will have to cb tomorrow morning.

## 2016-02-24 NOTE — Telephone Encounter (Signed)
I have sent order to [facility].  They are in network with pt's insurance - [employer].  Nothing further needed.

## 2016-02-24 NOTE — Telephone Encounter (Signed)
Spoke with Marchelle FolksAmanda @ [facility] and she states that they do not contract with pt's particular [employer] plan. Order will need to be sent to another DME for CPAP machine. [facility].

## 2016-02-24 NOTE — Telephone Encounter (Signed)
Faxed script back to [facility].../lmb 

## 2016-03-07 ENCOUNTER — Ambulatory Visit (INDEPENDENT_AMBULATORY_CARE_PROVIDER_SITE_OTHER): Payer: Managed Care, Other (non HMO) | Admitting: Family

## 2016-03-07 ENCOUNTER — Encounter: Payer: Self-pay | Admitting: Family

## 2016-03-07 DIAGNOSIS — I10 Essential (primary) hypertension: Secondary | ICD-10-CM

## 2016-03-07 MED ORDER — HYDRALAZINE HCL 25 MG PO TABS: 25.0000 mg | ORAL_TABLET | Freq: Three times a day (TID) | ORAL | Status: DC

## 2016-03-07 NOTE — Progress Notes (Signed)
Subjective:    Patient ID: Robert Cruz, male    DOB: 1976/05/19, 40 y.o.   MRN: [id number]  Chief Complaint  Patient presents with  . Follow-up    says the headaches has eased up some, only hurts when he is hungry, wants to talk about his blood pressure    HPI:  DONTREL SMETHERS is a 40 y.o. male who  has a past medical history of Hypertension; GERD (gastroesophageal reflux disease); Chicken pox; and Sleep apnea. and presents today for a follow up office visit.   1.) Hypertension - Currently maintained on atenolol, lisinopril-hydrochlorothiazide, and clonidine. Reports taking the medication as prescribed and denies adverse side effects. Does not currently monitor his blood pressure at home. Headaches have improved with no other symptoms of end organ damage present. He continues to work on lifestyle changes as he has increased his physical activity and improving his nutrition. He has lost 15 pounds since his last visit. He has also worked with cardiology where he was noted to have an abnormal EKG but his echocardiogram was normal. He continues to work with Pulmonology for his sleep apnea. Previous renal artery ultrasound has yet to be completed.   Wt Readings from Last 3 Encounters:  03/07/16 338 lb (153.316 kg)  02/05/16 320 lb (145.151 kg)  01/14/16 353 lb (160.12 kg)   BP Readings from Last 3 Encounters:  03/07/16 160/102  01/14/16 170/88  01/07/16 146/92   2.) Morbid Obesity - BMI of 43.3. He has lost approximately 15 pounds since his previous visit and continues to work on lifestyle management. He presents today with questions regarding bariatric surgery and the appropriateness for his situation. Questions if it will help with his overall health as well as decreased the possible need for CPAP given his diagnosis of sleep apnea.  Allergies  Allergen Reactions  . Amlodipine Other (See Comments)    edema     Current Outpatient Prescriptions on File Prior to Visit    Medication Sig Dispense Refill  . atenolol (TENORMIN) 100 MG tablet Take 1 tablet (100 mg total) by mouth daily. 30 tablet 6  . lisinopril-hydrochlorothiazide (ZESTORETIC) 20-12.5 MG tablet Take 2 tablets by mouth daily. 180 tablet 1  . traMADol (ULTRAM) 50 MG tablet TAKE ONE TABLET BY MOUTH EVERY 8 HOURS AS NEEDED 30 tablet 0  . [DISCONTINUED] lisinopril (PRINIVIL,ZESTRIL) 20 MG tablet Take 1 tablet (20 mg total) by mouth daily. 30 tablet 1   No current facility-administered medications on file prior to visit.    Past Medical History  Diagnosis Date  . Hypertension   . GERD (gastroesophageal reflux disease)   . Chicken pox   . Sleep apnea     Past Surgical History  Procedure Laterality Date  . Unremarkable.      Review of Systems  Constitutional: Negative for fever and chills.  Respiratory: Negative for chest tightness and shortness of breath.   Cardiovascular: Negative for chest pain, palpitations and leg swelling.  Neurological: Negative for headaches.      Objective:    BP 160/102 mmHg  Pulse 60  Temp(Src) 98.4 F (36.9 C) (Oral)  Resp 16  Ht  (1.88 m)  Wt 338 lb (153.316 kg)  BMI 43.38 kg/m2  SpO2 98% Nursing note and vital signs reviewed.  Physical Exam  Constitutional: He is oriented to person, place, and time. He appears well-developed and well-nourished. No distress.  Cardiovascular: Normal rate, regular rhythm, normal heart sounds and intact distal pulses.  Pulmonary/Chest: Effort normal and breath sounds normal.  Neurological: He is alert and oriented to person, place, and time.  Skin: Skin is warm and dry.  Psychiatric: He has a normal mood and affect. His behavior is normal. Judgment and thought content normal.       Assessment & Plan:   Problem List Items Addressed This Visit      Cardiovascular and Mediastinum   Essential hypertension    Blood pressure remains above goal 140/90 with current regimen with improvement since initiation of  treatment. No symptoms of end organ damage. Continue current dosage of atenolol and lisinopril-hydrochlorothiazide. Discontinue clonidine. Start hydralazine. Encouraged to monitor blood pressure at home. Continue to work on lifestyle management. Follow-up with cardiology as scheduled.      Relevant Medications   hydrALAZINE (APRESOLINE) 25 MG tablet     Other   Severe obesity (BMI >= 40) (HCC) - Primary    Positive progress with 15 pounds of weight loss and modifications to nutrition and physical activity. His lost 15 pounds. Discussed possibility of gastric surgeries with referral to general surgery place per patient request. Encouraged continued lifestyle management and improvement. Follow-up pending general surgery referral.      Relevant Orders   Ambulatory referral to General Surgery     Note: Greater than 35 minutes was spent in face-to-face contact with 20 minutes discussing and counseling regarding plan of care, bariatric surgery, and blood pressure management.  I have discontinued Mr. Goldammer's metoprolol succinate and cloNIDine. I am also having him start on hydrALAZINE. Additionally, I am having him maintain his lisinopril-hydrochlorothiazide, atenolol, and traMADol.   Meds ordered this encounter  Medications  . hydrALAZINE (APRESOLINE) 25 MG tablet    Sig: Take 1 tablet (25 mg total) by mouth 3 (three) times daily.    Dispense:  90 tablet    Refill:  0    Order Specific Question:  Supervising Provider    Answer:  Hillard DankerRAWFORD, ELIZABETH A [[id number]]     Follow-up: Return in about 1 month (around 04/06/2016).  Jeanine Luzalone, Gregory, FNP

## 2016-03-07 NOTE — Assessment & Plan Note (Signed)
Blood pressure remains above goal 140/90 with current regimen with improvement since initiation of treatment. No symptoms of end organ damage. Continue current dosage of atenolol and lisinopril-hydrochlorothiazide. Discontinue clonidine. Start hydralazine. Encouraged to monitor blood pressure at home. Continue to work on lifestyle management. Follow-up with cardiology as scheduled.

## 2016-03-07 NOTE — Patient Instructions (Signed)
Thank you for choosing [facility].  Summary/Instructions:  Please continue to take your blood pressure medication as prescribed.   They will call you to set up an appointment with bariatric surgery.  Continue to work on weight loss.  Sleeve Gastrectomy A sleeve gastrectomy is a surgery in which a large portion of the stomach is removed. After the surgery, the stomach will be a narrow tube about the size of a banana. This surgery is performed to help a person lose weight. The person loses weight because the reduced size of the stomach restricts the amount of food that the person can eat. The stomach will hold much less food than before the surgery. Also, the part of the stomach that is removed produces a hormone that causes hunger.  This surgery is done for people who have morbid obesity, defined as a body mass index (BMI) greater than 40. BMI is an estimate of body fat and is calculated from the height and weight of a person. This surgery may also be done for people with a BMI between 35 and 40 if they have other diseases, such as type 2 diabetes mellitus, obstructive sleep apnea, or heart and lung disorders (cardiopulmonary diseases).  LET [facility] CARE PROVIDER KNOW ABOUT:  Any allergies you have.   All medicines you are taking, including vitamins, herbs, eyedrops, creams, and over-the-counter medicines.   Use of steroids (by mouth or creams).   Previous problems you or members of your family have had with the use of anesthetics.   Any blood disorders you have.   Previous surgeries you have had.   Possibility of pregnancy, if this applies.   Other health problems you have. RISKS AND COMPLICATIONS Generally, sleeve gastrectomy is a safe procedure. However, as with any procedure, complications can occur. Possible complications include:  Infection.  Bleeding.  Blood clots.  Damage to other organs or tissue.  Leakage of fluid from the stomach into the  abdominal cavity (rare). BEFORE THE PROCEDURE  You may need to have blood tests and imaging tests (such as X-rays or ultrasonography) done before the day of surgery. A test to evaluate your esophagus and how it moves (esophageal manometry) may also be done.  You may be placed on a liquid diet 2-3 weeks before the surgery.  Ask your health care provider about changing or stopping your regular medicines.  Do not eat or drink anything for at least 8 hours before the procedure.   Make plans to have someone drive you home after your hospital stay. Also arrange for someone to help you with activities during recovery. PROCEDURE  A laparoscopic technique is usually used for this surgery:  You will be given medicine to make you sleep through the procedure (general anesthetic). This medicine will be given through an intravenous (IV) access tube that is put into one of your veins.  Once you are asleep, your abdomen will be cleaned and sterilized.  Several small incisions will be made in your abdomen.  Your abdomen will be filled with air so that it expands. This gives the surgeon more room to operate and makes your organs easier to see.  A thin, lighted tube with a tiny camera on the end (laparoscope) is put through a small incision in your abdomen. The camera on the laparoscope sends a picture to a TV screen in the operating room. This gives the surgeon a good view inside the abdomen.  Hollow tubes are put through the other small incisions in your  abdomen. The tools needed for the procedure are put through these tubes.  The surgeon uses staples to divide part of the stomach and then removes it through one of the incisions.  The remaining stomach may be reinforced using stitches or surgical glue or both to prevent leakage of the stomach contents. A small tube (drain) may be placed through one of the incisions to allow extra fluid to flow from the area.  The incisions are closed with stitches,  staples, or glue. AFTER THE PROCEDURE  You will be monitored closely in a recovery area. Once the anesthetic has worn off, you will likely be moved to a regular hospital room.  You will be given medicine for pain and nausea.   You may have a drain from one of the incisions in your abdomen. If a drain is used, it may stay in place after you go home from the hospital and be removed at a follow-up appointment.   You will be encouraged to walk around several times a day. This helps prevent blood clots.  You will be started on a liquid diet the first day after your surgery. Sometimes a test is done to check for leaking before you can eat.  You will be urged to cough and do deep breathing exercises. This helps prevent a lung infection after a surgery.  You will likely need to stay in the hospital for a few days.    This information is not intended to replace advice given to you by your health care provider. Make sure you discuss any questions you have with your health care provider.   Document Released: 09/18/2009 Document Revised: 07/24/2013 Document Reviewed: 05/15/2015 Elsevier Interactive Patient Education [employer]2016 Elsevier Inc.

## 2016-03-07 NOTE — Progress Notes (Signed)
Pre visit review using our clinic review tool, if applicable. No additional management support is needed unless otherwise documented below in the visit note. 

## 2016-03-07 NOTE — Assessment & Plan Note (Signed)
Positive progress with 15 pounds of weight loss and modifications to nutrition and physical activity. His lost 15 pounds. Discussed possibility of gastric surgeries with referral to general surgery place per patient request. Encouraged continued lifestyle management and improvement. Follow-up pending general surgery referral.

## 2016-03-22 ENCOUNTER — Telehealth: Payer: Self-pay | Admitting: Family

## 2016-03-22 NOTE — Telephone Encounter (Signed)
Pt called to check the status of the Cpap machine. Pt had sleep done sometime in [place] or feb. Call pt @ [phone]. Thank you!

## 2016-03-22 NOTE — Telephone Encounter (Signed)
Does pulmonology take care of this?

## 2016-03-22 NOTE — Telephone Encounter (Signed)
Yes, please check with pulmonology regarding this.

## 2016-03-23 ENCOUNTER — Telehealth: Payer: Self-pay | Admitting: Pulmonary Disease

## 2016-03-23 NOTE — Telephone Encounter (Signed)
Called and spoke to pt. Pt states he has not heard from [facility] regarding his CPAP. Called [facility] and spoke [place] and was advised [facility] has tried to reach out to the pt but they have been unable to reach him. Called and spoke to pt and informed him he will need to call [facility] at [phone]. Pt verbalized understanding and denied any further questions or concerns at this time.

## 2016-03-23 NOTE — Telephone Encounter (Signed)
Pt aware.

## 2016-04-05 ENCOUNTER — Ambulatory Visit (INDEPENDENT_AMBULATORY_CARE_PROVIDER_SITE_OTHER): Payer: Managed Care, Other (non HMO) | Admitting: Cardiovascular Disease

## 2016-04-05 ENCOUNTER — Encounter: Payer: Self-pay | Admitting: Cardiovascular Disease

## 2016-04-05 VITALS — BP 180/100 | HR 90 | Ht 74.0 in | Wt 343.1 lb

## 2016-04-05 DIAGNOSIS — I10 Essential (primary) hypertension: Secondary | ICD-10-CM | POA: Diagnosis not present

## 2016-04-05 LAB — BASIC METABOLIC PANEL
BUN: 12 mg/dL (ref 7–25)
CHLORIDE: 102 mmol/L (ref 98–110)
CO2: 26 mmol/L (ref 20–31)
Calcium: 9.5 mg/dL (ref 8.6–10.3)
Creat: 1.24 mg/dL (ref 0.60–1.35)
GLUCOSE: 108 mg/dL — AB (ref 65–99)
POTASSIUM: 3.3 mmol/L — AB (ref 3.5–5.3)
SODIUM: 139 mmol/L (ref 135–146)

## 2016-04-05 MED ORDER — SPIRONOLACTONE 25 MG PO TABS: 25.0000 mg | ORAL_TABLET | Freq: Every day | ORAL | Status: DC

## 2016-04-05 NOTE — Patient Instructions (Signed)
Medication Instructions:  START Aldactone 25 mg once daily   Labwork: TODAY - basic metabolic panel  Your physician recommends that you return for lab work in: 1 week for basic metabolic panel   Testing/Procedures: None Ordered   Follow-Up: Your physician recommends that you schedule a follow-up appointment in: 4-6 weeks with Dr. Elease HashimotoNahser.    If you need a refill on your cardiac medications before your next appointment, please call your pharmacy.   Thank you for choosing [facility]! Eligha BridegroomMichelle Swinyer, RN [phone]

## 2016-04-05 NOTE — Progress Notes (Signed)
Cardiology Office Note   Date:  04/05/2016   ID:  Robert QuanDavid T Cruz, DOB 06/11/1976, MRN [id number]  PCP:  Jeanine Luzalone, Gregory, FNP  Cardiologist:   Vesta MixerNahser, Philip J, MD   Chief Complaint  Patient presents with  . Follow-up    pt c/o headaches   Problem List 1. Essential HTN 2. Obstructive sleep apnea 3, GERD    History of Present Illness: Robert Cruz is a 40 y.o. male who presents for evaluation of his HTN He has been on multiple meds with minimal response  Very busy at work .    Not a lot of time to exercise  Has been on a better diet for the past 1 year  or so.  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  No DOE doing fairly moderate exercise ( ie no dyspnea walking up the stadium steps at the [employer] [place] a month ago )  Has not lost any weight on his new diet.   Typically break fast - eggs, Malaw[place]urkey sausage /turkey bacon  Lunch - subway - 6 inch or salad   - used to eat lots of fried foods, [employer], chick fil a  Dinner - spag. Occasionally take out from [employer]   Apr 05, 2016:  Robert HuaDavid is seen back today for follow up of his HTN Echo shows normal LV function with moderate LAE  Has tried clonidine -  Was not effective so it was stopped  Hydralazine was substituted.  BP is still high  Has tried Bystolic - caused a severe headache Still drinking lots of sweet tea Lots of juices. Still eats out quite a bit .  Weight is 343 today .  I advised him to lose 5-10 lbs before his next visit .       Past Medical History  Diagnosis Date  . Hypertension   . GERD (gastroesophageal reflux disease)   . Chicken pox   . Sleep apnea     Past Surgical History  Procedure Laterality Date  . Unremarkable.       Current Outpatient Prescriptions  Medication Sig Dispense Refill  . atenolol (TENORMIN) 100 MG tablet Take 1 tablet (100 mg total) by mouth daily. 30  tablet 6  . hydrALAZINE (APRESOLINE) 25 MG tablet Take 1 tablet (25 mg total) by mouth 3 (three) times daily. 90 tablet 0  . lisinopril-hydrochlorothiazide (ZESTORETIC) 20-12.5 MG tablet Take 2 tablets by mouth daily. 180 tablet 1  . traMADol (ULTRAM) 50 MG tablet TAKE ONE TABLET BY MOUTH EVERY 8 HOURS AS NEEDED 30 tablet 0  . [DISCONTINUED] lisinopril (PRINIVIL,ZESTRIL) 20 MG tablet Take 1 tablet (20 mg total) by mouth daily. 30 tablet 1   No current facility-administered medications for this visit.    Allergies:   Amlodipine    Social History:  The patient  reports that he has never smoked. He has never used smokeless tobacco. He reports that he drinks alcohol. He reports that he does not use illicit drugs.   Family History:  The patient's family history includes Depression in his father; Healthy in his brother and sister; Hypertension in his father, maternal grandmother, and mother; Kidney disease in his maternal grandmother. There is no history of Colon cancer, Esophageal cancer, Rectal cancer, or Stomach cancer.    ROS:  Please see the history of present  illness.    Review of Systems: Constitutional:  denies fever, chills, diaphoresis, appetite change and fatigue.  HEENT: denies photophobia, eye pain, redness, hearing loss, ear pain, congestion, sore throat, rhinorrhea, sneezing, neck pain, neck stiffness and tinnitus.  Respiratory: denies SOB, DOE, cough, chest tightness, and wheezing.  Cardiovascular: denies chest pain, palpitations and leg swelling.  Gastrointestinal: denies nausea, vomiting, abdominal pain, diarrhea, constipation, blood in stool.  Genitourinary: denies dysuria, urgency, frequency, hematuria, flank pain and difficulty urinating.  Musculoskeletal: denies  myalgias, back pain, joint swelling, arthralgias and gait problem.   Skin: denies pallor, rash and wound.  Neurological: denies dizziness, seizures, syncope, weakness, light-headedness, numbness and headaches.     Hematological: denies adenopathy, easy bruising, personal or family bleeding history.  Psychiatric/ Behavioral: denies suicidal ideation, mood changes, confusion, nervousness, sleep disturbance and agitation.       All other systems are reviewed and negative.    PHYSICAL EXAM: VS:  BP 180/100 mmHg  Pulse 90  Ht  (1.88 m)  Wt 343 lb 1.9 oz (155.638 kg)  BMI 44.04 kg/m2  SpO2 97% , BMI Body mass index is 44.04 kg/(m^2). GEN: Well nourished, well developed, in no acute distress HEENT: normal Neck: no JVD, carotid bruits, or masses Cardiac: RRR; no murmurs, rubs, or gallops,no edema  Respiratory:  clear to auscultation bilaterally, normal work of breathing GI: soft, nontender, nondistended, + BS MS: no deformity or atrophy Skin: warm and dry, no rash Neuro:  Strength and sensation are intact Psych: normal   EKG:  EKG is ordered today. The ekg ordered 10/10/15 demonstrates NSR , NS ST abn.    Recent Labs: 10/19/2015: BUN 12; Creatinine, Ser 1.02; Hemoglobin 12.8*; Platelets 311; Potassium 3.2*; Sodium 138    Lipid Panel    Component Value Date/Time   CHOL 168 10/07/2013 0817   TRIG 72 10/07/2013 0817   HDL 38* 10/07/2013 0817   CHOLHDL 4.4 10/07/2013 0817   VLDL 14 10/07/2013 0817   LDLCALC 116* 10/07/2013 0817      Wt Readings from Last 3 Encounters:  04/05/16 343 lb 1.9 oz (155.638 kg)  03/07/16 338 lb (153.316 kg)  02/05/16 320 lb (145.151 kg)      Other studies Reviewed: Additional studies/ records that were reviewed today include: . Review of the above records demonstrates:    ASSESSMENT AND PLAN:  1.  Essential HTN Weight loss - we have discussed the importance of weight loss again  Still drinking lots of sweet tea . Salt restriction - he does a fairly good job of avoiding salt  I've recommended that he buy a Omron BP cuff and keep a blood pressure log. We have tried metoprolol and  carvedilol  .   These did not  We have already tried  bystolic  Will add Aldactone 25 mg a day - he may have a high renin state. ( last potassium was very low)  BMP today and in 1 week   2. Leg edema  Ibetter   3. Abn ECG Echo - normal LV function .   Mod LAE   4. Obstructive sleep apnea He has an appt on May 12    Current medicines are reviewed at length with the patient today.  The patient does not have concerns regarding medicines.  The following changes have been made:  We'll discontinue metoprolol and try carvedilol 25 mg twice a day.  Labs/ tests ordered today include:  No orders of the defined types were placed in this  encounter.     Disposition:   FU with me in 4-6 weeks    Nahser, Deloris Ping, MD  04/05/2016 4:27 PM    [facility] [address] [place], [place], [place]  [address] Phone: [phone]; Fax: [phone]   [facility]  [address] [place], [place]  [address] [phone]   Fax [phone]

## 2016-04-08 ENCOUNTER — Other Ambulatory Visit: Payer: Self-pay | Admitting: Family

## 2016-04-12 ENCOUNTER — Telehealth: Payer: Self-pay | Admitting: Pulmonary Disease

## 2016-04-12 ENCOUNTER — Other Ambulatory Visit (INDEPENDENT_AMBULATORY_CARE_PROVIDER_SITE_OTHER): Payer: Managed Care, Other (non HMO) | Admitting: *Deleted

## 2016-04-12 DIAGNOSIS — I10 Essential (primary) hypertension: Secondary | ICD-10-CM

## 2016-04-12 DIAGNOSIS — G4733 Obstructive sleep apnea (adult) (pediatric): Secondary | ICD-10-CM

## 2016-04-12 NOTE — Telephone Encounter (Signed)
[facility]. She states that they have attempted to contact the pt several times with no return call. They do have documentation of each phone call. Also verified the phone number and they have the same one we have. [facility] that we will submit the order once again, however if the same situation occurs again we will have to see the pt to figure out what is going on.  Called pt and he states that [facility] has been very hard to deal with. Pt would like order resent to [facility], but if they give him the same runaround he would like to change DME's. Pt advised that he will call us if anything changes. Order resent. Nothing further needed.

## 2016-04-13 LAB — BASIC METABOLIC PANEL
BUN: 16 mg/dL (ref 7–25)
CALCIUM: 9.4 mg/dL (ref 8.6–10.3)
CO2: 24 mmol/L (ref 20–31)
CREATININE: 1.22 mg/dL (ref 0.60–1.35)
Chloride: 100 mmol/L (ref 98–110)
GLUCOSE: 93 mg/dL (ref 65–99)
Potassium: 3.7 mmol/L (ref 3.5–5.3)
Sodium: 137 mmol/L (ref 135–146)

## 2016-04-15 ENCOUNTER — Institutional Professional Consult (permissible substitution): Payer: Managed Care, Other (non HMO) | Admitting: Pulmonary Disease

## 2016-04-18 ENCOUNTER — Telehealth: Payer: Self-pay | Admitting: Cardiovascular Disease

## 2016-04-18 NOTE — Telephone Encounter (Signed)
Left message for patient to call office regarding lab results.

## 2016-04-18 NOTE — Telephone Encounter (Signed)
F/u  Pt returning RN phone call. Please call back and discuss.   

## 2016-04-19 NOTE — Telephone Encounter (Signed)
Lab results and plan reviewed with patient who verbalized understanding

## 2016-05-08 ENCOUNTER — Other Ambulatory Visit: Payer: Self-pay | Admitting: Family

## 2016-05-09 ENCOUNTER — Encounter: Payer: Self-pay | Admitting: Cardiovascular Disease

## 2016-05-09 ENCOUNTER — Ambulatory Visit (INDEPENDENT_AMBULATORY_CARE_PROVIDER_SITE_OTHER): Payer: Managed Care, Other (non HMO) | Admitting: Cardiovascular Disease

## 2016-05-09 VITALS — BP 152/100 | HR 64 | Ht 73.0 in | Wt 341.0 lb

## 2016-05-09 DIAGNOSIS — I10 Essential (primary) hypertension: Secondary | ICD-10-CM

## 2016-05-09 MED ORDER — HYDROCHLOROTHIAZIDE 25 MG PO TABS: 25.0000 mg | ORAL_TABLET | Freq: Every day | ORAL | Status: DC

## 2016-05-09 MED ORDER — VALSARTAN 320 MG PO TABS: 320.0000 mg | ORAL_TABLET | Freq: Every day | ORAL | Status: DC

## 2016-05-09 NOTE — Patient Instructions (Signed)
Medication Instructions:  STOP Lisinopril HCT START Valsartan 320 mg once daily START HCTZ 25 mg once daily   Labwork: Your physician recommends that you return for lab work in: 3 weeks on June 27 for basic metabolic panel   Testing/Procedures: None Ordered   Follow-Up: Your physician recommends that you return for a follow-up appointment in: 3 weeks for Nurse visit/BP check on Tuesday June 27 at 11:00  Your physician recommends that you schedule a follow-up appointment in: 3 months with Dr. Elease HashimotoNahser   If you need a refill on your cardiac medications before your next appointment, please call your pharmacy.   Thank you for choosing [facility]! Eligha BridegroomMichelle Shakeria Robinette, RN [phone]

## 2016-05-09 NOTE — Progress Notes (Signed)
Cardiology Office Note   Date:  05/09/2016   ID:  Robert Cruz, DOB 12/01/1976, MRN [id number]  PCP:  Jeanine Luzalone, Gregory, FNP  Cardiologist:   Kristeen MissPhilip Haruko Mersch, MD   Chief Complaint  Patient presents with  . Hypertension   Problem List 1. Essential HTN 2. Obstructive sleep apnea 3, GERD    History of Present Illness: Robert QuanDavid T Blassingame is a 40 y.o. male who presents for evaluation of his HTN He has been on multiple meds with minimal response  Very busy at work .    Not a lot of time to exercise  Has been on a better diet for the past 1 year  or so.  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  No DOE doing fairly moderate exercise ( ie no dyspnea walking up the stadium steps at the [employer] [place] a month ago )  Has not lost any weight on his new diet.   Typically break fast - eggs, Malaw[place]key sausage /turkey bacon  Lunch - subway - 6 inch or salad   - used to eat lots of fried foods, [employer], chick fil a  Dinner - spag. Occasionally take out from [employer], [employer]   Apr 05, 2016:  Robert Cruz is seen back today for follow up of his HTN Echo shows normal LV function with moderate LAE  Has tried clonidine -  Was not effective so it was stopped  Hydralazine was substituted.  BP is still high  Has tried Bystolic - caused a severe headache Still drinking lots of sweet tea Lots of juices. Still eats out quite a bit .  Weight is 343 today .  I advised him to lose 5-10 lbs before his next visit .   May 09, 2016  weight is 341 today  BP is elevated.  Has OSA - has been using CPAP , may need some adjustment on the mask gasket  Has been working lots - worked 24 straight days  Avoiding salt     Past Medical History  Diagnosis Date  . Hypertension   . GERD (gastroesophageal reflux disease)   . Chicken pox   . Sleep apnea     Past Surgical History  Procedure Laterality Date  .  Unremarkable.       Current Outpatient Prescriptions  Medication Sig Dispense Refill  . atenolol (TENORMIN) 100 MG tablet Take 1 tablet (100 mg total) by mouth daily. 30 tablet 6  . spironolactone (ALDACTONE) 25 MG tablet Take 1 tablet (25 mg total) by mouth daily. 30 tablet 11  . hydrALAZINE (APRESOLINE) 25 MG tablet TAKE ONE TABLET BY MOUTH THREE TIMES DAILY 90 tablet 0  . hydrochlorothiazide (HYDRODIURIL) 25 MG tablet Take 1 tablet (25 mg total) by mouth daily. 30 tablet 11  . valsartan (DIOVAN) 320 MG tablet Take 1 tablet (320 mg total) by mouth daily. 30 tablet 11  . [DISCONTINUED] lisinopril (PRINIVIL,ZESTRIL) 20 MG tablet Take 1 tablet (20 mg total) by mouth daily. 30 tablet 1   No current facility-administered medications for this visit.    Allergies:   Amlodipine and Bystolic    Social History:  The patient  reports that he has never smoked. He has never used smokeless tobacco. He reports that he drinks alcohol. He reports that he does not use illicit drugs.   Family History:  The patient's family history includes Depression in his father; Healthy in his brother and sister; Hypertension in his father, maternal grandmother, and mother; Kidney disease in his maternal grandmother. There is no history of Colon cancer, Esophageal cancer, Rectal cancer, or Stomach cancer.    ROS:  Please see the history of present illness.    Review of Systems: Constitutional:  denies fever, chills, diaphoresis, appetite change and fatigue.  HEENT: denies photophobia, eye pain, redness, hearing loss, ear pain, congestion, sore throat, rhinorrhea, sneezing, neck pain, neck stiffness and tinnitus.  Respiratory: denies SOB, DOE, cough, chest tightness, and wheezing.  Cardiovascular: denies chest pain, palpitations and leg swelling.  Gastrointestinal: denies nausea, vomiting, abdominal pain, diarrhea, constipation, blood in stool.  Genitourinary: denies dysuria, urgency, frequency, hematuria, flank  pain and difficulty urinating.  Musculoskeletal: denies  myalgias, back pain, joint swelling, arthralgias and gait problem.   Skin: denies pallor, rash and wound.  Neurological: denies dizziness, seizures, syncope, weakness, light-headedness, numbness and headaches.   Hematological: denies adenopathy, easy bruising, personal or family bleeding history.  Psychiatric/ Behavioral: denies suicidal ideation, mood changes, confusion, nervousness, sleep disturbance and agitation.       All other systems are reviewed and negative.    PHYSICAL EXAM: VS:  BP 152/100 mmHg  Pulse 64  Ht 6\' 1"  (1.854 m)  Wt 341 lb (154.677 kg)  BMI 45.00 kg/m2 , BMI Body mass index is 45 kg/(m^2). GEN: Well nourished, well developed, in no acute distress HEENT: normal Neck: no JVD, carotid bruits, or masses Cardiac: RRR; no murmurs, rubs, or gallops,no edema  Respiratory:  clear to auscultation bilaterally, normal work of breathing GI: soft, nontender, nondistended, + BS MS: no deformity or atrophy Skin: warm and dry, no rash Neuro:  Strength and sensation are intact Psych: normal   EKG:  EKG is ordered today. The ekg ordered 10/10/15 demonstrates NSR , NS ST abn.    Recent Labs: 10/19/2015: Hemoglobin 12.8*; Platelets 311 04/12/2016: BUN 16; Creat 1.22; Potassium 3.7; Sodium 137    Lipid Panel    Component Value Date/Time   CHOL 168 10/07/2013 0817   TRIG 72 10/07/2013 0817   HDL 38* 10/07/2013 0817   CHOLHDL 4.4 10/07/2013 0817   VLDL 14 10/07/2013 0817   LDLCALC 116* 10/07/2013 0817      Wt Readings from Last 3 Encounters:  05/09/16 341 lb (154.677 kg)  04/05/16 343 lb 1.9 oz (155.638 kg)  03/07/16 338 lb (153.316 kg)      Other studies Reviewed: Additional studies/ records that were reviewed today include: . Review of the above records demonstrates:    ASSESSMENT AND PLAN:  1.  Essential HTN Weight loss - we have discussed the importance of weight loss again  Still drinking  lots of sweet tea . Salt restriction - he does a fairly good job of avoiding salt  I've recommended that he buy a Omron BP cuff and keep a blood pressure log. We have tried metoprolol and  carvedilol  .   These did not  We have already tried bystolic  Is on Aldactone 25 mg a day -   2. Leg edema  Ibetter   3. Abn ECG Echo - normal LV function .   Mod LAE   4. Obstructive sleep apnea He has an appt on May 12    Current medicines are reviewed at length with the patient today.  The patient does not have concerns regarding medicines.  The following changes have been made:  We'll discontinue metoprolol and try carvedilol 25 mg twice a day.  Labs/ tests ordered today include:   Orders Placed This Encounter  Procedures  . Basic Metabolic Panel (BMET)    Disposition:   FU with me in 4-6 weeks    Kristeen Miss, MD  05/09/2016 5:16 PM    [facility] [address] [place], [place], [place]  [address] Phone: [phone]; Fax: [phone]   [facility]  [address] [place], [place]  [address] [phone]   Fax [phone]

## 2016-05-20 ENCOUNTER — Ambulatory Visit: Payer: Managed Care, Other (non HMO) | Admitting: Neurology

## 2016-05-27 ENCOUNTER — Other Ambulatory Visit (INDEPENDENT_AMBULATORY_CARE_PROVIDER_SITE_OTHER): Payer: Managed Care, Other (non HMO) | Admitting: *Deleted

## 2016-05-27 DIAGNOSIS — I10 Essential (primary) hypertension: Secondary | ICD-10-CM

## 2016-05-27 LAB — BASIC METABOLIC PANEL
BUN: 13 mg/dL (ref 7–25)
CHLORIDE: 102 mmol/L (ref 98–110)
CO2: 26 mmol/L (ref 20–31)
CREATININE: 1.28 mg/dL (ref 0.60–1.35)
Calcium: 9.1 mg/dL (ref 8.6–10.3)
GLUCOSE: 128 mg/dL — AB (ref 65–99)
POTASSIUM: 3.7 mmol/L (ref 3.5–5.3)
Sodium: 142 mmol/L (ref 135–146)

## 2016-05-30 ENCOUNTER — Other Ambulatory Visit: Payer: Managed Care, Other (non HMO)

## 2016-06-03 ENCOUNTER — Other Ambulatory Visit: Payer: Self-pay | Admitting: Family

## 2016-06-03 ENCOUNTER — Encounter: Payer: Self-pay | Admitting: Cardiovascular Disease

## 2016-06-03 ENCOUNTER — Ambulatory Visit (INDEPENDENT_AMBULATORY_CARE_PROVIDER_SITE_OTHER): Payer: Managed Care, Other (non HMO) | Admitting: Cardiovascular Disease

## 2016-06-03 VITALS — BP 180/110 | HR 72 | Ht 73.0 in | Wt 342.0 lb

## 2016-06-03 DIAGNOSIS — I10 Essential (primary) hypertension: Secondary | ICD-10-CM

## 2016-06-03 DIAGNOSIS — Z79899 Other long term (current) drug therapy: Secondary | ICD-10-CM

## 2016-06-03 MED ORDER — SPIRONOLACTONE 50 MG PO TABS: 50.0000 mg | ORAL_TABLET | Freq: Every day | ORAL | Status: DC

## 2016-06-03 NOTE — Progress Notes (Signed)
Patient comes in today for BP check.  He is still experiencing resistant HTN.  BP today 180/110, rechecked 10 min later without change. Reviewed with Dr. Elease HashimotoNahser -- orders to increase Aldactone to 50 mg daily, BMP in one week and return in 1 month for BP check/nurse visit.  Renin/Aldosterone ratio and Renin plasma blood work ordered for today.  Upon reviewing patient's chart he was to have a renal ultrasound to rule out renal artery stenosis through PCP office.  Patient tells me that he had no clue about that test and has not received any call to schedule this.  Advised patient to contact PCP to schedule this test.

## 2016-06-03 NOTE — Patient Instructions (Addendum)
Medication Instructions:  Your physician has recommended you make the following change in your medication:  1) INCREASE Aldactone (Spironolactone) to 50 mg once daily    (new prescription sent to pharmacy for 50 mg tablets.  You may take  two of your 25 mg tablets daily until you need to refill this medication)  Labwork: Today: Aldosterone/renin activity ratio and Renin plasma level  Testing/Procedures: None ordered  Follow-Up: Your physician recommends that you schedule a follow-up appointment in: 1 week for BMET  Your physician recommends that you schedule a follow-up appointment in: 1 month for nurse visit for BP check.  Any Other Special Instructions Will Be Listed Below (If Applicable). Please call your primary doctor to schedule the renal ultrasound  If you need a refill on your cardiac medications before your next appointment, please call your pharmacy.  Thank you for choosing [facility]!!

## 2016-06-05 ENCOUNTER — Encounter (HOSPITAL_COMMUNITY): Payer: Self-pay | Admitting: *Deleted

## 2016-06-05 ENCOUNTER — Ambulatory Visit (HOSPITAL_COMMUNITY)
Admission: EM | Admit: 2016-06-05 | Discharge: 2016-06-05 | Disposition: A | Discharge status: Home / Self Care | Payer: Managed Care, Other (non HMO) | Attending: Family Medicine | Admitting: Family Medicine

## 2016-06-05 DIAGNOSIS — L03211 Cellulitis of face: Secondary | ICD-10-CM | POA: Diagnosis not present

## 2016-06-05 HISTORY — DX: Morbid (severe) obesity due to excess calories: E66.01

## 2016-06-05 MED ORDER — SULFAMETHOXAZOLE-TRIMETHOPRIM 800-160 MG PO TABS: 1.0000 | ORAL_TABLET | Freq: Two times a day (BID) | ORAL | Status: DC

## 2016-06-05 MED ORDER — IBUPROFEN 600 MG PO TABS: 600.0000 mg | ORAL_TABLET | Freq: Four times a day (QID) | ORAL | Status: DC | PRN

## 2016-06-05 MED ORDER — IBUPROFEN 800 MG PO TABS
ORAL_TABLET | ORAL | Status: AC
  Filled 2016-06-05: qty 1

## 2016-06-05 MED ORDER — IBUPROFEN 800 MG PO TABS
800.0000 mg | ORAL_TABLET | Freq: Once | ORAL | Status: AC
  Administered 2016-06-05: 800 mg via ORAL

## 2016-06-05 NOTE — ED Notes (Signed)
Recently had a dosage increase on his HTN meds, as BP has remained "normally" around today's value.  Yesterday started with swollen lower lip, under chin, and anterior neck.  Denies any throat swelling, difficulty breathing or swallowing, or any tongue or oral issues.  Pt due to have lab work done this week to check renal function.

## 2016-06-05 NOTE — ED Provider Notes (Signed)
CSN: [id number]     Arrival date & time 06/05/16  1158 History   None    Chief Complaint  Patient presents with  . Angioedema   HPI Robert Cruz is a 40 y.o. male presenting for lip swelling.   He reports noticing gradual focal swelling on the right lower lip yesterday when he noticed a white spot on the growth, he popped it with a needle. It drained pus then blood and has not drained anything in the past 24 hours, but lip and chin have continued to swell. No tongue swelling or neck pain/stiffness. No trouble breathing, swallowing or speaking. No fevers/chills, N/V/D, chest pain or palpitations. He has continued to eat and drink normally. He does not have diabetes and has never had an abscess before.   Past Medical History  Diagnosis Date  . Hypertension   . GERD (gastroesophageal reflux disease)   . Chicken pox   . Sleep apnea   . Morbid obesity (HCC)    History reviewed. No pertinent past surgical history. Family History  Problem Relation Age of Onset  . Hypertension Father   . Depression Father   . Hypertension Mother   . Kidney disease Maternal Grandmother     Dialysis  . Hypertension Maternal Grandmother   . Healthy Brother     x1  . Healthy Sister     x3  . Colon cancer Neg Hx   . Esophageal cancer Neg Hx   . Rectal cancer Neg Hx   . Stomach cancer Neg Hx    Social History  Substance Use Topics  . Smoking status: Never Smoker   . Smokeless tobacco: Never Used  . Alcohol Use: No   Review of Systems: As above  Allergies  Amlodipine and Bystolic  Home Medications   Prior to Admission medications   Medication Sig Start Date End Date Taking? Authorizing Provider  atenolol (TENORMIN) 100 MG tablet Take 1 tablet (100 mg total) by mouth daily. 01/15/16  Yes Adam Mliss Fritz Jaffe, DO  hydrALAZINE (APRESOLINE) 25 MG tablet TAKE ONE TABLET BY MOUTH THREE TIMES DAILY 05/09/16  Yes Veryl SpeakGregory D Calone, FNP  hydrochlorothiazide (HYDRODIURIL) 25 MG tablet Take 1 tablet (25 mg total)  by mouth daily. 05/09/16  Yes Vesta MixerPhilip J Nahser, MD  spironolactone (ALDACTONE) 50 MG tablet Take 1 tablet (50 mg total) by mouth daily. 06/03/16  Yes Vesta MixerPhilip J Nahser, MD  valsartan (DIOVAN) 320 MG tablet Take 1 tablet (320 mg total) by mouth daily. 05/09/16  Yes Vesta MixerPhilip J Nahser, MD  ibuprofen (ADVIL,MOTRIN) 600 MG tablet Take 1 tablet (600 mg total) by mouth every 6 (six) hours as needed (severe pain). 06/05/16   Tyrone Nineyan B Prerana Strayer, MD  sulfamethoxazole-trimethoprim (BACTRIM DS,SEPTRA DS) 800-160 MG tablet Take 1 tablet by mouth 2 (two) times daily. 06/05/16   Tyrone Nineyan B Vennie Salsbury, MD   Meds Ordered and Administered this Visit   Medications  ibuprofen (ADVIL,MOTRIN) tablet 800 mg (800 mg Oral Given 06/05/16 1233)   BP 185/117 mmHg  Pulse 86  Temp(Src) 98.6 F (37 C) (Oral)  Resp 16  SpO2 96% No data found.  Physical Exam  Constitutional: He is oriented to person, place, and time. He appears well-developed and well-nourished. No distress.  HENT:  Head: Normocephalic and atraumatic.  Nose: Nose normal.  Mouth/Throat: Oropharynx is clear and moist. No oropharyngeal exudate.  Grossly swollen right lower lip with induration including vermilion border and extending to the chin without fluctuance. No involvement of tongue. Very poor dentition  without signs of intra-oral abscess. Neck supple, full ROM.   Eyes: EOM are normal. Pupils are equal, round, and reactive to light. No scleral icterus.  Neck: Neck supple. No JVD present.  Cardiovascular: Normal rate, regular rhythm, normal heart sounds and intact distal pulses.   No murmur heard. Pulmonary/Chest: Effort normal and breath sounds normal. No respiratory distress.  Abdominal: Soft. Bowel sounds are normal. He exhibits no distension. There is no tenderness.  Musculoskeletal: Normal range of motion. He exhibits no edema or tenderness.  Lymphadenopathy:    He has no cervical adenopathy.  Neurological: He is alert and oriented to person, place, and time. He exhibits  normal muscle tone.  Skin: Skin is warm and dry.  Vitals reviewed.   ED Course  Procedures (including critical care time)  Labs Review Labs Reviewed - No data to display  Imaging Review No results found.  MDM   1. Cellulitis of face     40 y.o. male without immunocompromise with cellulitis of lip. No fluctuance indicating I&D. No systemic symptoms indicating osteomyelitis. While pt is on ACE inhibitor, this is not consistent with angioedema. He will need follow up with PCP/cardiology for ongoing management of resistant HTN, he is currently in severe range, which seems to be his baseline, without evidence of end organ dysfunction.  Will start course of bactrim x 10 days, ibuprofen prn pain, warm compresses, and follow up with PCP early this week for re-evaluation. Also to return more urgently if symptoms worsen or fever develops.   Tyrone Nineyan B Janne Faulk, MD 06/05/16 1240

## 2016-06-05 NOTE — Discharge Instructions (Signed)
You have an infection in the skin around your lips and chin which will require antibiotics.  - Take bactrim DS tablet twice daily for 10 days - Call your PCP to be seen for follow up sometime early this week - Take ibuprofen 600mg  every 6 hours for pain - You MUST return for care if you develop fever or worsening tenderness - Place warm compresses on the areas of swelling

## 2016-06-06 ENCOUNTER — Other Ambulatory Visit (INDEPENDENT_AMBULATORY_CARE_PROVIDER_SITE_OTHER): Payer: Managed Care, Other (non HMO) | Admitting: *Deleted

## 2016-06-06 DIAGNOSIS — I10 Essential (primary) hypertension: Secondary | ICD-10-CM

## 2016-06-06 DIAGNOSIS — Z79899 Other long term (current) drug therapy: Secondary | ICD-10-CM | POA: Diagnosis not present

## 2016-06-06 LAB — BASIC METABOLIC PANEL
BUN: 11 mg/dL (ref 7–25)
CALCIUM: 8.8 mg/dL (ref 8.6–10.3)
CO2: 26 mmol/L (ref 20–31)
CREATININE: 1.19 mg/dL (ref 0.60–1.35)
Chloride: 103 mmol/L (ref 98–110)
GLUCOSE: 115 mg/dL — AB (ref 65–99)
Potassium: 3.7 mmol/L (ref 3.5–5.3)
SODIUM: 138 mmol/L (ref 135–146)

## 2016-06-06 NOTE — Addendum Note (Signed)
Addended by: Tonita PhoenixBOWDEN, Shashwat Cleary K on: 06/06/2016 01:27 PM   Modules accepted: Orders

## 2016-06-08 ENCOUNTER — Other Ambulatory Visit: Payer: Self-pay | Admitting: Family

## 2016-06-09 ENCOUNTER — Telehealth: Payer: Self-pay | Admitting: Pulmonary Disease

## 2016-06-09 ENCOUNTER — Telehealth: Payer: Self-pay | Admitting: *Deleted

## 2016-06-09 DIAGNOSIS — Z79899 Other long term (current) drug therapy: Secondary | ICD-10-CM

## 2016-06-09 DIAGNOSIS — I10 Essential (primary) hypertension: Secondary | ICD-10-CM

## 2016-06-09 NOTE — Telephone Encounter (Signed)
   DL for 3 weeks in 1/61095/2017 > AHI 3.2, 70%. On CPAP 12.   AD

## 2016-06-09 NOTE — Telephone Encounter (Signed)
Patient left [facility] on 6/30 w/o getting lab work. Will place orders, again, and schedule labs for tomorrow per pt request.

## 2016-06-09 NOTE — Telephone Encounter (Signed)
  Received this through staff message:     [facility] ALL THIS LABS DRAWN ON 06/10/16.-----THANKS  Received: 6 days ago    Dalene Seltzereborah D Miller  Amarilys Lyles L France Noyce, RN

## 2016-06-10 ENCOUNTER — Other Ambulatory Visit: Payer: Managed Care, Other (non HMO)

## 2016-06-13 ENCOUNTER — Telehealth: Payer: Self-pay | Admitting: Nurse Practitioner

## 2016-06-13 ENCOUNTER — Telehealth: Payer: Self-pay | Admitting: Cardiovascular Disease

## 2016-06-13 DIAGNOSIS — E2609 Other primary hyperaldosteronism: Secondary | ICD-10-CM

## 2016-06-13 NOTE — Telephone Encounter (Signed)
Spoke with patient and answered his questions to his satisfaction.  He states he is following up with PCP regarding renal scan and is aware that Dr. Remus BlakeKumar's office will call him to schedule appointment for endocrinology work up.  He thanked me for the call.

## 2016-06-13 NOTE — Telephone Encounter (Signed)
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 Please refer to endocrinology .

## 2016-06-13 NOTE — Telephone Encounter (Signed)
Reviewed lab results with patient and advised him that referral to endocrinology has been sent.  I advised him to call back if he does not get a call for an appointment in the next few days.  He verbalized understanding and appreciation for the call and the explanation.

## 2016-06-13 NOTE — Telephone Encounter (Signed)
New message   Pt calling for rn to call him and or wife to further explain lab results together

## 2016-06-14 LAB — ALDOSTERONE + RENIN ACTIVITY W/ RATIO
ALDO / PRA Ratio: 50 Ratio — ABNORMAL HIGH (ref 0.9–28.9)
ALDOSTERONE: 4 ng/dL
PRA LC/MS/MS: 0.08 ng/mL/h — AB (ref 0.25–5.82)

## 2016-06-14 LAB — RENIN

## 2016-06-16 ENCOUNTER — Encounter: Payer: Self-pay | Admitting: Pulmonary Disease

## 2016-06-17 ENCOUNTER — Ambulatory Visit (INDEPENDENT_AMBULATORY_CARE_PROVIDER_SITE_OTHER): Payer: Managed Care, Other (non HMO) | Admitting: Family

## 2016-06-17 ENCOUNTER — Encounter: Payer: Self-pay | Admitting: Family

## 2016-06-17 VITALS — BP 148/94 | HR 76 | Temp 98.0°F | Resp 18 | Ht 73.0 in | Wt 346.0 lb

## 2016-06-17 DIAGNOSIS — I10 Essential (primary) hypertension: Secondary | ICD-10-CM

## 2016-06-17 NOTE — Progress Notes (Signed)
Subjective:    Patient ID: Robert Cruz, male    DOB: 1976-09-28, 40 y.o.   MRN: [id number]  Chief Complaint  Patient presents with  . Follow-up    needs renal ultrasound, saw there was something wrong with renal artery at another doc    HPI:  Robert Cruz is a 40 y.o. male who  has a past medical history of Hypertension; GERD (gastroesophageal reflux disease); Chicken pox; Sleep apnea; and Morbid obesity (HCC). and presents today for a follow up office visit.   Recently evaluated by cardiology for resistant hypertension which was refractory to medication changes and found to have increased aldosterone levels indicating primary hyperaldosteronism with an urgent referral placed to endocrinology. He was also scheduled for an renal ultrasound which has yet to be completed. He is here to follow up about the same.   Allergies  Allergen Reactions  . Amlodipine Other (See Comments)    edema  . Bystolic [Nebivolol Hcl] Other (See Comments)    Patient stated medication gave him major headaches     Current Outpatient Prescriptions on File Prior to Visit  Medication Sig Dispense Refill  . atenolol (TENORMIN) 100 MG tablet Take 1 tablet (100 mg total) by mouth daily. 30 tablet 6  . hydrALAZINE (APRESOLINE) 25 MG tablet TAKE ONE TABLET BY MOUTH THREE TIMES DAILY 90 tablet 0  . hydrochlorothiazide (HYDRODIURIL) 25 MG tablet Take 1 tablet (25 mg total) by mouth daily. 30 tablet 11  . ibuprofen (ADVIL,MOTRIN) 600 MG tablet Take 1 tablet (600 mg total) by mouth every 6 (six) hours as needed (severe pain). 30 tablet 0  . spironolactone (ALDACTONE) 50 MG tablet Take 1 tablet (50 mg total) by mouth daily. 30 tablet 3  . sulfamethoxazole-trimethoprim (BACTRIM DS,SEPTRA DS) 800-160 MG tablet Take 1 tablet by mouth 2 (two) times daily. 20 tablet 0  . valsartan (DIOVAN) 320 MG tablet Take 1 tablet (320 mg total) by mouth daily. 30 tablet 11  . [DISCONTINUED] lisinopril (PRINIVIL,ZESTRIL) 20 MG  tablet Take 1 tablet (20 mg total) by mouth daily. 30 tablet 1   No current facility-administered medications on file prior to visit.    Review of Systems  Constitutional: Negative for fever and chills.  Eyes:       Negative for changes in vision  Respiratory: Negative for cough, chest tightness and wheezing.   Cardiovascular: Negative for chest pain, palpitations and leg swelling.  Neurological: Negative for dizziness, weakness and light-headedness.      Objective:    BP 148/94 mmHg  Pulse 76  Temp(Src) 98 F (36.7 C) (Oral)  Resp 18  Ht  (1.854 m)  Wt 346 lb (156.945 kg)  BMI 45.66 kg/m2  SpO2 97% Nursing note and vital signs reviewed.  Physical Exam  Constitutional: He is oriented to person, place, and time. He appears well-developed and well-nourished. No distress.  Cardiovascular: Normal rate, regular rhythm, normal heart sounds and intact distal pulses.   Pulmonary/Chest: Effort normal and breath sounds normal.  Neurological: He is alert and oriented to person, place, and time.  Skin: Skin is warm and dry.  Psychiatric: He has a normal mood and affect. His behavior is normal. Judgment and thought content normal.       Assessment & Plan:   Problem List Items Addressed This Visit      Cardiovascular and Mediastinum   Resistant hypertension - Primary    Blood pressure improved with current regimen and awaiting endocrinology appointment. Continue current  valsartan, hydralazine, spiranolactone, and hydrochlorothiazide. Renal ultrasound confirmed to be completed.       Relevant Orders   US Renal  US    Follow-up: Return in about 3 months (around 09/17/2016).  Jeanine Luzalone, Maris Abascal, FNP

## 2016-06-17 NOTE — Patient Instructions (Addendum)
Thank you for choosing [facility].  Summary/Instructions:  The ultrasound has been ordered.   Keep taking your medications as prescribed.   If your symptoms worsen or fail to improve, please contact our office for further instruction, or in case of emergency go directly to the emergency room at the closest medical facility.

## 2016-06-17 NOTE — Assessment & Plan Note (Signed)
Blood pressure improved with current regimen and awaiting endocrinology appointment. Continue current valsartan, hydralazine, spiranolactone, and hydrochlorothiazide. Renal ultrasound confirmed to be completed.

## 2016-06-22 ENCOUNTER — Ambulatory Visit
Admission: RE | Admit: 2016-06-22 | Discharge: 2016-06-22 | Disposition: A | Discharge status: Home / Self Care | Payer: Managed Care, Other (non HMO) | Source: Ambulatory Visit | Attending: Family | Admitting: Family

## 2016-06-22 ENCOUNTER — Telehealth: Payer: Self-pay | Admitting: Geriatric Medicine

## 2016-06-22 DIAGNOSIS — I10 Essential (primary) hypertension: Secondary | ICD-10-CM

## 2016-06-22 NOTE — Telephone Encounter (Signed)
Patient will be seen next week for Renal US.

## 2016-06-23 ENCOUNTER — Ambulatory Visit: Payer: Managed Care, Other (non HMO) | Admitting: Neurology

## 2016-06-28 ENCOUNTER — Other Ambulatory Visit: Payer: Managed Care, Other (non HMO)

## 2016-07-01 ENCOUNTER — Ambulatory Visit
Admission: RE | Admit: 2016-07-01 | Discharge: 2016-07-01 | Disposition: A | Discharge status: Home / Self Care | Payer: Managed Care, Other (non HMO) | Source: Ambulatory Visit | Attending: Family | Admitting: Family

## 2016-07-01 ENCOUNTER — Ambulatory Visit (INDEPENDENT_AMBULATORY_CARE_PROVIDER_SITE_OTHER): Payer: Managed Care, Other (non HMO) | Admitting: Pharmacist

## 2016-07-01 VITALS — BP 158/108 | HR 86

## 2016-07-01 DIAGNOSIS — I10 Essential (primary) hypertension: Secondary | ICD-10-CM

## 2016-07-01 MED ORDER — SPIRONOLACTONE 100 MG PO TABS: 100.0000 mg | ORAL_TABLET | Freq: Every day | ORAL | 6 refills | Status: DC

## 2016-07-01 NOTE — Progress Notes (Signed)
Patient ID: Robert Cruz                 DOB: [birth year]                      MRN: [id number]     HPI: Robert Cruz is a 40 y.o. male referred by Dr. Elease Hashimoto to HTN clinic.  He was recently diagnosed with primary hyperaldosteronism and has referral to endocrinology.  His appt is next Tuesday.  He was last seen on 6/30 for BP check.  His BP was 180/110.  His spironolactone was increased to 50mg  daily.  He had a follow up visit with PCP office on 7/14 and BP had improved to 148/94.  He completed renal ultrasound today and there was no evidence of renal artery stenosis however he entire artery was not visible due to body habitus.  His PMH is significant for OSA, HTN and GERD.  Echo showed normal LV function.  He is here today for follow up of his BP  Current HTN meds: atenolol 100mg  daily, hydralazine 25mg  TID, spironolactone 25mg  daily, valsartan 320mg  daily Previously tried: clonidine- ineffective, Bystolic- severe headaches, amlodipine- swelling BP goal: <140/90  Home BP readings: Pt does not check his BP at home.  He does not have a cuff but is going to try to get one this weekend.   Wt Readings from Last 3 Encounters:  06/17/16 (!) 346 lb (156.9 kg)  06/03/16 (!) 342 lb (155.1 kg)  05/09/16 (!) 341 lb (154.7 kg)   BP Readings from Last 3 Encounters:  07/01/16 (!) 158/108  06/17/16 (!) 148/94  06/05/16 (!) 185/117   Pulse Readings from Last 3 Encounters:  07/01/16 86  06/17/16 76  06/05/16 86    Renal function: CrCl cannot be calculated (Unknown ideal weight.).  Past Medical History:  Diagnosis Date  . Chicken pox   . GERD (gastroesophageal reflux disease)   . Hypertension   . Morbid obesity (HCC)   . Sleep apnea     Current Outpatient Prescriptions on File Prior to Visit  Medication Sig Dispense Refill  . atenolol (TENORMIN) 100 MG tablet Take 1 tablet (100 mg total) by mouth daily. 30 tablet 6  . hydrALAZINE (APRESOLINE) 25 MG tablet TAKE ONE TABLET BY MOUTH THREE  TIMES DAILY 90 tablet 0  . hydrochlorothiazide (HYDRODIURIL) 25 MG tablet Take 1 tablet (25 mg total) by mouth daily. 30 tablet 11  . ibuprofen (ADVIL,MOTRIN) 600 MG tablet Take 1 tablet (600 mg total) by mouth every 6 (six) hours as needed (severe pain). 30 tablet 0  . sulfamethoxazole-trimethoprim (BACTRIM DS,SEPTRA DS) 800-160 MG tablet Take 1 tablet by mouth 2 (two) times daily. 20 tablet 0  . valsartan (DIOVAN) 320 MG tablet Take 1 tablet (320 mg total) by mouth daily. 30 tablet 11  . [DISCONTINUED] lisinopril (PRINIVIL,ZESTRIL) 20 MG tablet Take 1 tablet (20 mg total) by mouth daily. 30 tablet 1   No current facility-administered medications on file prior to visit.     Allergies  Allergen Reactions  . Amlodipine Other (See Comments)    edema  . Bystolic [Nebivolol Hcl] Other (See Comments)    Patient stated medication gave him major headaches     Assessment/Plan: 1.  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  Pending evaluation with endocrine next week.  Plan to increase spironolactone and recheck BP and BMET in 2 weeks.

## 2016-07-01 NOTE — Patient Instructions (Signed)
Increase spironolactone to 100mg  daily.    Look for a blood pressure machine with a large cuff (Omiron brand preferred)  We will recheck your blood pressure and labs in 2 weeks

## 2016-07-05 ENCOUNTER — Ambulatory Visit (INDEPENDENT_AMBULATORY_CARE_PROVIDER_SITE_OTHER): Payer: Managed Care, Other (non HMO) | Admitting: Endocrinology

## 2016-07-05 ENCOUNTER — Encounter: Payer: Self-pay | Admitting: Endocrinology

## 2016-07-05 VITALS — BP 126/84 | HR 98 | Ht 73.0 in | Wt 349.0 lb

## 2016-07-05 DIAGNOSIS — I10 Essential (primary) hypertension: Secondary | ICD-10-CM | POA: Diagnosis not present

## 2016-07-05 DIAGNOSIS — R7303 Prediabetes: Secondary | ICD-10-CM | POA: Diagnosis not present

## 2016-07-05 NOTE — Progress Notes (Signed)
Patient ID: Robert Cruz, male   DOB: October 14, 1976, 40 y.o.   MRN: [id number]             Chief complaint: High blood pressure  Referring physician: Dr. Elease Hashimoto  History of Present Illness:  He was diagnosed to have blood pressure elevation at the age of [age] He says he has been on various blood pressure medications over the last several years with usually inadequate control He was on amlodipine until about January and this was stopped because of excessive leg edema He was also tried on Bystolic but was having headaches with this  He has been on HCTZ for several years and also has been taking valsartan and atenolol Most recently has been started on hydralazine in 6/17 and also Aldactone.  He says that he has had tendency to weight gain in the last few months Also he tends to have swelling of the legs in the evenings but mostly when he is working which involves standing on his feet  He was evaluated by his cardiologist for his hypertension in the last few months He has had occasional mild hypokalemia but has not required any potassium supplementation He was started on 25 mg of Aldactone in 5/17 when his potassium was 3.3  Renal vascular ultrasound did not clearly evaluate the renal artery origin but kidneys were normal He has not had any proteinuria  Also has part of his screening recently had an aldosterone/renin test but was taking all of the above medications and the test was done at about 1 PM in the afternoon Results show renin level of 0.03 and aldosterone 4.0   BP Readings from Last 3 Encounters:  07/05/16 126/84  07/01/16 (!) 158/108  06/17/16 (!) 148/94     Past Medical History:  Diagnosis Date  . Chicken pox   . GERD (gastroesophageal reflux disease)   . Hypertension   . Morbid obesity (HCC)   . Sleep apnea     No past surgical history on file.  Family History  Problem Relation Age of Onset  . Hypertension Father   . Depression Father   . Hypertension  Mother   . Kidney disease Maternal Grandmother     Dialysis  . Hypertension Maternal Grandmother   . Healthy Brother     x1  . Healthy Sister     x3  . Colon cancer Neg Hx   . Esophageal cancer Neg Hx   . Rectal cancer Neg Hx   . Stomach cancer Neg Hx     Social History:  reports that he has never smoked. He has never used smokeless tobacco. He reports that he does not drink alcohol or use drugs.  Allergies:  Allergies  Allergen Reactions  . Amlodipine Other (See Comments)    edema  . Bystolic [Nebivolol Hcl] Other (See Comments)    Patient stated medication gave him major headaches      Medication List       Accurate as of 07/05/16  4:28 PM. Always use your most recent med list.          atenolol 100 MG tablet Commonly known as:  TENORMIN Take 1 tablet (100 mg total) by mouth daily.   hydrALAZINE 25 MG tablet Commonly known as:  APRESOLINE TAKE ONE TABLET BY MOUTH THREE TIMES DAILY   hydrochlorothiazide 25 MG tablet Commonly known as:  HYDRODIURIL Take 1 tablet (25 mg total) by mouth daily.   ibuprofen 600 MG tablet Commonly known as:  ADVIL,MOTRIN  Take 1 tablet (600 mg total) by mouth every 6 (six) hours as needed (severe pain).   spironolactone 100 MG tablet Commonly known as:  ALDACTONE Take 1 tablet (100 mg total) by mouth daily.   sulfamethoxazole-trimethoprim 800-160 MG tablet Commonly known as:  BACTRIM DS,SEPTRA DS Take 1 tablet by mouth 2 (two) times daily.   valsartan 320 MG tablet Commonly known as:  DIOVAN Take 1 tablet (320 mg total) by mouth daily.       LABS:  No visits with results within 1 Week(s) from this visit.  Latest known visit with results is:  Lab on 06/06/2016  Component Date Value Ref Range Status  . Renin Activity 06/14/2016 CANCELED   Final   Comment: Duplicate test request.    Result canceled by the ancillary   . Sodium 06/06/2016 138  135 - 146 mmol/L Final  . Potassium 06/06/2016 3.7  3.5 - 5.3 mmol/L Final    . Chloride 06/06/2016 103  98 - 110 mmol/L Final  . CO2 06/06/2016 26  20 - 31 mmol/L Final  . Glucose, Bld 06/06/2016 115* 65 - 99 mg/dL Final  . BUN 36/14/4315 11  7 - 25 mg/dL Final  . Creat 40/07/6760 1.19  0.60 - 1.35 mg/dL Final  . Calcium 95/08/3266 8.8  8.6 - 10.3 mg/dL Final  . PRA LC/MS/MS 12/45/8099 0.08* 0.25 - 5.82 ng/mL/h Final  . ALDO / PRA Ratio 06/14/2016 50.0* 0.9 - 28.9 Ratio Final  . Aldosterone 06/14/2016 4  ng/dL Final   Comment:      Adult Reference Ranges for Aldosterone,    LC/MS/MS:       Upright 8:00-10:00 am    < or = 28 ng/dL     Upright 8:33-8:25 pm     < or = 21 ng/dL     Supine  0:53-97:67 am    3-16 ng/dL   This test was developed and its analytical performance characteristics have been determined by [facility]. It has not been cleared or approved by FDA. This assay has been validated pursuant to the CLIA regulations and is used for clinical purposes.           Review of Systems  Constitutional: Positive for weight gain. Negative for malaise.  HENT: Negative for headaches.   Eyes: Negative for blurred vision.  Respiratory: Negative for shortness of breath.   Cardiovascular: Positive for leg swelling. Negative for chest pain and palpitations.  Gastrointestinal: Negative for abdominal pain.  Endocrine: Negative for fatigue and light-headedness.  Genitourinary: Negative for frequency.  Musculoskeletal: Negative for muscle cramps and back pain.  Skin: Negative for rash.  Allergic/Immunologic:       No nasal congestion  Neurological: Negative for numbness.   He has gradually gained weight, about 30 pounds in the last 2 years  Wt Readings from Last 3 Encounters:  07/05/16 (!) 349 lb (158.3 kg)  06/17/16 (!) 346 lb (156.9 kg)  06/03/16 (!) 342 lb (155.1 kg)   He has not had diabetes, last A1c was 6.1 in 10/2013 No recent fasting glucose available  Lab Results  Component Value Date   HGBA1C  6.1 (H) 10/07/2013   Lab Results  Component Value Date   LDLCALC 116 (H) 10/07/2013   CREATININE 1.19 06/06/2016      PHYSICAL EXAM:  BP 126/84 (BP Location: Left Arm, Patient Position: Sitting, Cuff Size: Normal)   Pulse 98   Ht 6\' 1"  (1.854 m)   Wt Marland Kitchen)  349 lb (158.3 kg)   SpO2 98%   BMI 46.04 kg/m   GENERAL:Marked generalized obesity present   No pallor  EYES:  Externally normal.  Fundii:  normal discs and vessels, not well seen on the right.  ENT: Oral mucosa and tongue normal.  NECK: Acanthosis present.  No lymphadenopathy THYROID:  Not palpable.  HEART:  Normal  S1 and S2; no murmur or click.  CHEST:  Normal shape.  Lungs: Vescicular breath sounds heard equally.  No crepitations/ wheeze.  ABDOMEN:  No distention.  Liver and spleen not palpable.  No other mass or tenderness. No renal artery bruit heard  NEUROLOGICAL: .Reflexes are bilaterally normal at biceps  JOINTS:  Normal.  EXTREMITIES: 1+ lower leg edema present, has clear marking of upper border of his socks   ASSESSMENT:    HYPERTENSION, severe  He has had resistant hypertension, currently on 5 drugs Although his evaluation for hyperaldosteronism was done with his taking an ARB drug as well as spironolactone it does show relatively low renin level. The aldosterone level however is only 4.0 usually out hyperaldosteronism Also he has not had any history of significant hypokalemia, has had mild hypokalemia only with taking HCTZ in the past The aldosterone level has to be over 15 to make the criteria for the diagnosis of hyperaldosteronism  Most likely has relatively low renin level indicates volume overload and this is probably why he is having resistant hypertension Currently his blood pressure is improving with increasing his dose of Aldactone to 100 mg last week   PLAN:   No further endocrine evaluation is indicated at this time   Low-sodium diet.  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

## 2016-07-10 ENCOUNTER — Other Ambulatory Visit: Payer: Self-pay | Admitting: Family

## 2016-07-15 ENCOUNTER — Ambulatory Visit: Payer: Managed Care, Other (non HMO) | Admitting: Pharmacist

## 2016-07-19 ENCOUNTER — Encounter (INDEPENDENT_AMBULATORY_CARE_PROVIDER_SITE_OTHER): Payer: Self-pay

## 2016-07-19 ENCOUNTER — Ambulatory Visit (INDEPENDENT_AMBULATORY_CARE_PROVIDER_SITE_OTHER): Payer: Managed Care, Other (non HMO) | Admitting: Pharmacist

## 2016-07-19 VITALS — BP 136/90 | HR 74

## 2016-07-19 DIAGNOSIS — I10 Essential (primary) hypertension: Secondary | ICD-10-CM | POA: Diagnosis not present

## 2016-07-19 LAB — BASIC METABOLIC PANEL
BUN: 17 mg/dL (ref 7–25)
CHLORIDE: 102 mmol/L (ref 98–110)
CO2: 27 mmol/L (ref 20–31)
CREATININE: 1.25 mg/dL (ref 0.60–1.35)
Calcium: 9.8 mg/dL (ref 8.6–10.3)
Glucose, Bld: 102 mg/dL — ABNORMAL HIGH (ref 65–99)
Potassium: 4.3 mmol/L (ref 3.5–5.3)
Sodium: 137 mmol/L (ref 135–146)

## 2016-07-19 NOTE — Progress Notes (Signed)
Patient ID: Robert Cruz                 DOB: 02/27/1976                      MRN: [name]     HPI: Robert QuanDavid T Cruz is a 40 y.o. male referred by Dr. Elease HashimotoNahser to HTN clinic. 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 He saw Dr. Lucianne MussKumar, endocrinologist, on 07/05/2016, and was not diagnosed as hyperaldosteronism given aldosterone level being only 4.0, lower than criteria of >15 on ARB and spironolactone. His BP medications were continued including spironolactone 100 mg daily.   He was last seen on 7/28 for BP check. His BP was 158/108. His spironolactone dose was increased to 100 mg daily. He completed renal ultrasound on the same day. And there was no evidence of renal artery stenosis however his entire artery was not visible due to body habitus.   His PMH is significant for OSA, HTN and GERD.  Echo showed normal LV function.  He is here today for follow up of his BP.  Pt has done well since last visit.  He continues to try to limit his salt intake.  He does complain of lower extremity swelling after standing on his feet all day at work.   Current HTN meds: atenolol 100mg  daily, hydralazine 25mg  TID, spironolactone 100 mg daily, valsartan 320mg  daily Previously tried: clonidine- ineffective, Bystolic- severe headaches, amlodipine- swelling BP goal: <140/90  Home BP readings: Pt does not check his BP at home.  He does not have a cuff but is going to try to get one this weekend.    Wt Readings from Last 3 Encounters:  07/05/16 (!) 349 lb (158.3 kg)  06/17/16 (!) 346 lb (156.9 kg)  06/03/16 (!) 342 lb (155.1 kg)   BP Readings from Last 3 Encounters:  07/19/16 136/90  07/05/16 126/84  07/01/16 (!) 158/108   Pulse Readings from Last 3 Encounters:  07/19/16 74  07/05/16 98  07/01/16 86    Renal function: CrCl cannot be calculated (Patient's most recent lab result is older than the maximum 21 days allowed.).  Past Medical History:  Diagnosis  Date  . Chicken pox   . GERD (gastroesophageal reflux disease)   . Hypertension   . Morbid obesity (HCC)   . Sleep apnea     Current Outpatient Prescriptions on File Prior to Visit  Medication Sig Dispense Refill  . atenolol (TENORMIN) 100 MG tablet Take 1 tablet (100 mg total) by mouth daily. 30 tablet 6  . hydrALAZINE (APRESOLINE) 25 MG tablet Take 1 tablet (25 mg total) by mouth 3 (three) times daily. 90 tablet 2  . hydrochlorothiazide (HYDRODIURIL) 25 MG tablet Take 1 tablet (25 mg total) by mouth daily. 30 tablet 11  . ibuprofen (ADVIL,MOTRIN) 600 MG tablet Take 1 tablet (600 mg total) by mouth every 6 (six) hours as needed (severe pain). (Patient not taking: Reported on 07/05/2016) 30 tablet 0  . spironolactone (ALDACTONE) 100 MG tablet Take 1 tablet (100 mg total) by mouth daily. 30 tablet 6  . sulfamethoxazole-trimethoprim (BACTRIM DS,SEPTRA DS) 800-160 MG tablet Take 1 tablet by mouth 2 (two) times daily. (Patient not taking: Reported on 07/05/2016) 20 tablet 0  . valsartan (DIOVAN) 320 MG tablet Take 1 tablet (320 mg total) by mouth daily. 30 tablet 11  . [DISCONTINUED] lisinopril (PRINIVIL,ZESTRIL) 20 MG tablet Take 1 tablet (20 mg total) by mouth  daily. 30 tablet 1   No current facility-administered medications on file prior to visit.     Allergies  Allergen Reactions  . Amlodipine Other (See Comments)    edema  . Bystolic [Nebivolol Hcl] Other (See Comments)    Patient stated medication gave him major headaches     Assessment/Plan: 1.  Hypertension- Pt has resistant hypertension with BP control improved upon spironolactone dose increase to 100 mg daily. Will check BMET today.  If renal function and potassium stable, can consider increasing spironolactone and stopping hydralazine.

## 2016-07-20 ENCOUNTER — Other Ambulatory Visit: Payer: Self-pay | Admitting: Pharmacist

## 2016-07-20 MED ORDER — ATENOLOL 100 MG PO TABS: 100.0000 mg | ORAL_TABLET | Freq: Every day | ORAL | 6 refills | Status: DC

## 2016-07-21 ENCOUNTER — Encounter: Payer: Self-pay | Admitting: Cardiovascular Disease

## 2016-07-22 ENCOUNTER — Encounter: Payer: Self-pay | Admitting: Internal Medicine

## 2016-07-27 ENCOUNTER — Other Ambulatory Visit (INDEPENDENT_AMBULATORY_CARE_PROVIDER_SITE_OTHER): Payer: Self-pay

## 2016-07-27 DIAGNOSIS — R7303 Prediabetes: Secondary | ICD-10-CM

## 2016-07-27 DIAGNOSIS — I10 Essential (primary) hypertension: Secondary | ICD-10-CM

## 2016-07-27 LAB — BASIC METABOLIC PANEL
BUN: 17 mg/dL (ref 6–23)
CHLORIDE: 104 meq/L (ref 96–112)
CO2: 28 meq/L (ref 19–32)
Calcium: 9.2 mg/dL (ref 8.4–10.5)
Creatinine, Ser: 1.38 mg/dL (ref 0.40–1.50)
GFR: 73.19 mL/min (ref >60.00)
GLUCOSE: 103 mg/dL — AB (ref 70–99)
POTASSIUM: 4.1 meq/L (ref 3.5–5.1)
SODIUM: 138 meq/L (ref 135–145)

## 2016-07-27 LAB — HEMOGLOBIN A1C: Hgb A1c MFr Bld: 6.4 % (ref 4.6–6.5)

## 2016-07-28 ENCOUNTER — Other Ambulatory Visit: Payer: Managed Care, Other (non HMO)

## 2016-08-01 ENCOUNTER — Ambulatory Visit (INDEPENDENT_AMBULATORY_CARE_PROVIDER_SITE_OTHER): Payer: Self-pay | Admitting: Pharmacist

## 2016-08-01 VITALS — BP 122/88

## 2016-08-01 DIAGNOSIS — I10 Essential (primary) hypertension: Secondary | ICD-10-CM

## 2016-08-01 NOTE — Progress Notes (Signed)
Patient ID: Robert Cruz                 DOB: 12/26/1975                      MRN: [id number]     HPI: Robert Cruz is a 40 y.o. male referred by Dr. Elease HashimotoNahser to HTN clinic. 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 He saw Dr. Lucianne MussKumar, endocrinologist, on 07/05/2016, and was not diagnosed as hyperaldosteronism given aldosterone level being only 4.0, lower than criteria of >15 on ARB and spironolactone. His BP medications were continued including spironolactone 100 mg daily.   He was last seen on 07/19/16 for BP check. His BP had improved to 136/90 with spironolactone 100mg  daily. His spironolactone dose was increased to 150 mg daily and he is here today for follow up. He completed renal ultrasound in July. And there was no evidence of renal artery stenosis however his entire artery was not visible due to body habitus.  His PMH is significant for OSA, HTN and GERD.  Echo showed normal LV function.    Pt has done well since last visit.  He continues to try to limit his salt intake.  His lower extremity edema has improved with his new job.  He did not have to get his compression stockings.  He is interested in decreasing the number of medications if possible.   Current HTN meds: atenolol 100mg  daily, hydralazine 25mg  TID, spironolactone 150 mg daily, valsartan 320mg  daily Previously tried: clonidine- ineffective, Bystolic- severe headaches, amlodipine- swelling BP goal: <140/90  Home BP readings: Pt does not check his BP at home.  He does not have a cuff but is going to try to get one this weekend.    Wt Readings from Last 3 Encounters:  07/05/16 (!) 349 lb (158.3 kg)  06/17/16 (!) 346 lb (156.9 kg)  06/03/16 (!) 342 lb (155.1 kg)   BP Readings from Last 3 Encounters:  08/01/16 122/88  07/19/16 136/90  07/05/16 126/84   Pulse Readings from Last 3 Encounters:  07/19/16 74  07/05/16 98  07/01/16 86    Renal function: CrCl cannot be calculated  (Unknown ideal weight.).  Past Medical History:  Diagnosis Date  . Chicken pox   . GERD (gastroesophageal reflux disease)   . Hypertension   . Morbid obesity (HCC)   . Sleep apnea     Current Outpatient Prescriptions on File Prior to Visit  Medication Sig Dispense Refill  . atenolol (TENORMIN) 100 MG tablet Take 1 tablet (100 mg total) by mouth daily. 30 tablet 6  . hydrALAZINE (APRESOLINE) 25 MG tablet Take 1 tablet (25 mg total) by mouth 3 (three) times daily. 90 tablet 2  . hydrochlorothiazide (HYDRODIURIL) 25 MG tablet Take 1 tablet (25 mg total) by mouth daily. 30 tablet 11  . ibuprofen (ADVIL,MOTRIN) 600 MG tablet Take 1 tablet (600 mg total) by mouth every 6 (six) hours as needed (severe pain). (Patient not taking: Reported on 07/05/2016) 30 tablet 0  . spironolactone (ALDACTONE) 100 MG tablet Take 1 tablet (100 mg total) by mouth daily. 30 tablet 6  . sulfamethoxazole-trimethoprim (BACTRIM DS,SEPTRA DS) 800-160 MG tablet Take 1 tablet by mouth 2 (two) times daily. (Patient not taking: Reported on 07/05/2016) 20 tablet 0  . valsartan (DIOVAN) 320 MG tablet Take 1 tablet (320 mg total) by mouth daily. 30 tablet 11  . [DISCONTINUED] lisinopril (PRINIVIL,ZESTRIL) 20 MG tablet Take  1 tablet (20 mg total) by mouth daily. 30 tablet 1   No current facility-administered medications on file prior to visit.     Allergies  Allergen Reactions  . Amlodipine Other (See Comments)    edema  . Bystolic [Nebivolol Hcl] Other (See Comments)    Patient stated medication gave him major headaches     Assessment/Plan: 1.  Hypertension- Pt has resistant hypertension with BP control improved upon spironolactone dose increase to 150 mg daily.  His BMET was checked last week.  SCr did bump up slightly but still within 30% of baseline.  Will stop hydralazine to decrease pill burden.  Plan to follow up with patient in 1 month.

## 2016-08-01 NOTE — Patient Instructions (Signed)
1.  Stop hydralazine  2.  Continue your other medications  3.  Recheck your blood pressure in 3-4 weeks.

## 2016-08-02 ENCOUNTER — Ambulatory Visit: Payer: Managed Care, Other (non HMO) | Admitting: Endocrinology

## 2016-08-02 ENCOUNTER — Ambulatory Visit: Payer: Managed Care, Other (non HMO)

## 2016-08-09 ENCOUNTER — Telehealth: Payer: Self-pay | Admitting: Cardiovascular Disease

## 2016-08-09 NOTE — Telephone Encounter (Signed)
Spoke with pt and he states that he recently switched jobs and is currently without insurance.  Pt unable to afford Valsartan or Spironolactone right now with no insurance.  Pt wanted to know if Dr. Elease HashimotoNahser could recommend a medication that will be cheaper while he is without insurance.  Pt said if we call tomorrow to leave him a detailed message with instructions as he goes back to work and doesn't get off until 3:30pm.  Pt would like meds sent to [facility] on [address]

## 2016-08-09 NOTE — Telephone Encounter (Signed)
I agree with note from Audrie LiaSally Earl.

## 2016-08-09 NOTE — Telephone Encounter (Signed)
Spironolactone 25mg  tablets are on the $4 list.  He can get 90 tablets for $10.  Will just take a large amount of tablets to get him to dose we have him on.  He can also change from ARB to ACE-I and get it covered on $4 list to help with cost.  Suggest lisinopril 20mg .  Would also like for him to come in for BP check and blood work since that is a lower dose of ACE-I than ARB.

## 2016-08-09 NOTE — Telephone Encounter (Signed)
New message   Patient will not have insurance for about a  month - the cost for valsartan 320 mg is  $ 150.00 . Patient is asking for something similar or cheaper until he can get his medication.    Patient had already submit the refill request when he was advise on cost.

## 2016-08-10 MED ORDER — SPIRONOLACTONE 25 MG PO TABS: 150.0000 mg | ORAL_TABLET | Freq: Every day | ORAL | 11 refills | Status: DC

## 2016-08-10 MED ORDER — LISINOPRIL 20 MG PO TABS: 20.0000 mg | ORAL_TABLET | Freq: Every day | ORAL | 11 refills | Status: DC

## 2016-08-10 NOTE — Telephone Encounter (Signed)
Spoke with patient about alternative medications.  He states he currently takes spironolactone 150 mg per last office visit on 8/28 with Audrie LiaSally Earl, RPh.  He is in agreement to pick up Rx for 25 mg tablets and to take 6 pills daily.  He agrees to stop valsartan due to cost and start lisinopril 20 mg.  He is scheduled for hypertension clinic on 9/25.  I advised him to call back with questions or concerns prior to that time.  He verbalized understanding and agreement with plan.

## 2016-08-18 ENCOUNTER — Ambulatory Visit: Payer: Managed Care, Other (non HMO) | Admitting: Cardiovascular Disease

## 2016-08-23 ENCOUNTER — Ambulatory Visit: Payer: Managed Care, Other (non HMO)

## 2016-08-29 ENCOUNTER — Encounter: Payer: Self-pay | Admitting: Pharmacist

## 2016-08-29 ENCOUNTER — Other Ambulatory Visit: Payer: Self-pay

## 2016-08-29 ENCOUNTER — Encounter: Payer: Self-pay | Admitting: Pulmonary Disease

## 2016-08-29 NOTE — Progress Notes (Signed)
This encounter was created in error - please disregard.

## 2016-08-30 ENCOUNTER — Ambulatory Visit (INDEPENDENT_AMBULATORY_CARE_PROVIDER_SITE_OTHER): Payer: Self-pay | Admitting: Pharmacist

## 2016-08-30 ENCOUNTER — Other Ambulatory Visit: Payer: Self-pay | Admitting: *Deleted

## 2016-08-30 VITALS — BP 118/84 | HR 71 | Wt 338.0 lb

## 2016-08-30 DIAGNOSIS — I10 Essential (primary) hypertension: Secondary | ICD-10-CM

## 2016-08-30 LAB — BASIC METABOLIC PANEL
BUN: 16 mg/dL (ref 7–25)
CALCIUM: 9.7 mg/dL (ref 8.6–10.3)
CO2: 23 mmol/L (ref 20–31)
Chloride: 104 mmol/L (ref 98–110)
Creat: 1.48 mg/dL — ABNORMAL HIGH (ref 0.60–1.35)
GLUCOSE: 90 mg/dL (ref 65–99)
Potassium: 4.4 mmol/L (ref 3.5–5.3)
SODIUM: 137 mmol/L (ref 135–146)

## 2016-08-30 MED ORDER — SPIRONOLACTONE 100 MG PO TABS: 150.0000 mg | ORAL_TABLET | Freq: Every day | ORAL | 3 refills | Status: DC

## 2016-08-30 NOTE — Progress Notes (Signed)
Patient ID: Robert QuanDavid T Cruz                 DOB: 09/19/1976                      MRN: [id number]     HPI: Robert PottsDavid T Whitsettis a very pleasant 40 y.o.malereferred by Dr. Ellwood SayersNahserto HTN clinic who presents today for follow up.PMH is significant for OSA, HTN and GERD. Echo showed normal LV function. 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 He saw Dr. Lucianne MussKumar, endocrinologist, on 07/05/2016, and was not diagnosed as hyperaldosteronism given aldosterone level of 4.0, which was lower than criteria of >15 for diagnosis. He completed a renal ultrasound in July; there was no evidence of renal artery stenosis however his entire artery was not visible due to body habitus.  At last visit, hydralazine was d/ced to decrease pill burden. Pt also switched from valsartan to lisinopril for cost reasons.  Pt has done well since last visit. He continues to try to limit his salt intake. Currently on a Mediterranean diet and has not been eating any meat.Does not have a BP cuff yet but is interested in monitoring his BP at home.  Current HTN meds:atenolol 100mg  daily,  HCTZ 25mg  daily, spironolactone 150 mg daily, lisinopril 20mg  daily Previously tried:clonidine- ineffective, Bystolic- severe headaches, amlodipine- swelling, valsartan - cost BP goal: <140/2990mmHg   Wt Readings from Last 3 Encounters:  07/05/16 (!) 349 lb (158.3 kg)  06/17/16 (!) 346 lb (156.9 kg)  06/03/16 (!) 342 lb (155.1 kg)   BP Readings from Last 3 Encounters:  08/01/16 122/88  07/19/16 136/90  07/05/16 126/84   Pulse Readings from Last 3 Encounters:  07/19/16 74  07/05/16 98  07/01/16 86    Renal function: CrCl cannot be calculated (Unknown ideal weight.).  Past Medical History:  Diagnosis Date  . Chicken pox   . GERD (gastroesophageal reflux disease)   . Hypertension   . Morbid obesity (HCC)   . Sleep apnea     Current Outpatient Prescriptions on File Prior to Visit    Medication Sig Dispense Refill  . atenolol (TENORMIN) 100 MG tablet Take 1 tablet (100 mg total) by mouth daily. 30 tablet 6  . hydrALAZINE (APRESOLINE) 25 MG tablet Take 1 tablet (25 mg total) by mouth 3 (three) times daily. 90 tablet 2  . hydrochlorothiazide (HYDRODIURIL) 25 MG tablet Take 1 tablet (25 mg total) by mouth daily. 30 tablet 11  . ibuprofen (ADVIL,MOTRIN) 600 MG tablet Take 1 tablet (600 mg total) by mouth every 6 (six) hours as needed (severe pain). (Patient not taking: Reported on 07/05/2016) 30 tablet 0  . lisinopril (PRINIVIL,ZESTRIL) 20 MG tablet Take 1 tablet (20 mg total) by mouth daily. 30 tablet 11  . spironolactone (ALDACTONE) 25 MG tablet Take 6 tablets (150 mg total) by mouth daily. 180 tablet 11  . sulfamethoxazole-trimethoprim (BACTRIM DS,SEPTRA DS) 800-160 MG tablet Take 1 tablet by mouth 2 (two) times daily. (Patient not taking: Reported on 07/05/2016) 20 tablet 0   No current facility-administered medications on file prior to visit.     Allergies  Allergen Reactions  . Amlodipine Other (See Comments)    edema  . Bystolic [Nebivolol Hcl] Other (See Comments)    Patient stated medication gave him major headaches     Assessment/Plan:  1. Hypertension - BP well controlled <140/1890mmHg on 4 HTN medication regimen. Pt has been feeling well, spironolactone has been a  large factor in bringing BP under control given pt's elevated aldosterone levels. No medication changes today. Will check BMET d/t previous rise in SCr. Pt has f/u with Dr. Elease Hashimoto next month.   Megan E. Supple, PharmD, CPP [facility] [address], [place], [place] [address] Phone: [phone]; Fax: [phone] 08/30/2016 4:28 PM

## 2016-08-30 NOTE — Addendum Note (Signed)
Addended by: Tonita PhoenixBOWDEN, ROBIN K on: 08/30/2016 04:27 PM   Modules accepted: Orders

## 2016-08-31 ENCOUNTER — Other Ambulatory Visit: Payer: Self-pay | Admitting: Pharmacist

## 2016-08-31 MED ORDER — SPIRONOLACTONE 25 MG PO TABS: 150.0000 mg | ORAL_TABLET | Freq: Every day | ORAL | 11 refills | Status: DC

## 2016-08-31 NOTE — Progress Notes (Signed)
Pt called regarding copay on his spironolactone. He does not have drug insurance and only the 25mg  strength of spironolactone is on the $4 generic [facility] list. He will need to take 6 tablets a day since the 50mg  and 100mg  strength are too expensive for him. Pt is aware and new rx sent to pharmacy.

## 2016-09-12 ENCOUNTER — Encounter (HOSPITAL_COMMUNITY): Payer: Self-pay | Admitting: *Deleted

## 2016-09-12 ENCOUNTER — Ambulatory Visit (HOSPITAL_COMMUNITY)
Admission: EM | Admit: 2016-09-12 | Discharge: 2016-09-12 | Disposition: A | Discharge status: Home / Self Care | Payer: BLUE CROSS/BLUE SHIELD | Attending: Internal Medicine | Admitting: Internal Medicine

## 2016-09-12 DIAGNOSIS — L049 Acute lymphadenitis, unspecified: Secondary | ICD-10-CM | POA: Diagnosis not present

## 2016-09-12 DIAGNOSIS — J069 Acute upper respiratory infection, unspecified: Secondary | ICD-10-CM

## 2016-09-12 MED ORDER — AMOXICILLIN 500 MG PO CAPS: 500.0000 mg | ORAL_CAPSULE | Freq: Three times a day (TID) | ORAL | 0 refills | Status: DC

## 2016-09-12 NOTE — ED Triage Notes (Signed)
Patient reports feeling like lymph nodes are swollen bilaterally on neck, with nasal congestion, and cough. Unsure of fevers.

## 2016-09-12 NOTE — Discharge Instructions (Signed)
Moist heat to lymph node.   Salt water gargles.   Ibuprofen as needed.

## 2016-09-13 NOTE — ED Provider Notes (Signed)
CSN: [id number]     Arrival date & time 09/12/16  1712 History   First MD Initiated Contact with Patient 09/12/16 1913     Chief Complaint  Patient presents with  . Nasal Congestion   (Consider location/radiation/quality/duration/timing/severity/associated sxs/prior Treatment) HPI NP 40 y/o male with swollen lymph nodes for 2 days. No home treatment. URI sx, not sure if he has had fever.  Past Medical History:  Diagnosis Date  . Chicken pox   . GERD (gastroesophageal reflux disease)   . Hypertension   . Morbid obesity (HCC)   . Sleep apnea    History reviewed. No pertinent surgical history. Family History  Problem Relation Age of Onset  . Hypertension Father   . Depression Father   . Hypertension Mother   . Kidney disease Maternal Grandmother     Dialysis  . Hypertension Maternal Grandmother   . Healthy Brother     x1  . Healthy Sister     x3  . Colon cancer Neg Hx   . Esophageal cancer Neg Hx   . Rectal cancer Neg Hx   . Stomach cancer Neg Hx    Social History  Substance Use Topics  . Smoking status: Never Smoker  . Smokeless tobacco: Never Used  . Alcohol use No    Review of Systems  Denies: HEADACHE, NAUSEA, ABDOMINAL PAIN, CHEST PAIN, CONGESTION, DYSURIA, SHORTNESS OF BREATH  Allergies  Amlodipine and Bystolic [nebivolol hcl]  Home Medications   Prior to Admission medications   Medication Sig Start Date End Date Taking? Authorizing Provider  atenolol (TENORMIN) 100 MG tablet Take 1 tablet (100 mg total) by mouth daily. 07/20/16  Yes Vesta Mixer, MD  hydrochlorothiazide (HYDRODIURIL) 25 MG tablet Take 1 tablet (25 mg total) by mouth daily. 05/09/16  Yes Vesta Mixer, MD  lisinopril (PRINIVIL,ZESTRIL) 20 MG tablet Take 1 tablet (20 mg total) by mouth daily. 08/10/16 11/08/16 Yes Vesta Mixer, MD  spironolactone (ALDACTONE) 25 MG tablet Take 6 tablets (150 mg total) by mouth daily. 08/31/16  Yes Vesta Mixer, MD  amoxicillin (AMOXIL) 500 MG capsule  Take 1 capsule (500 mg total) by mouth 3 (three) times daily. 09/12/16   Tharon Aquas, PA   Meds Ordered and Administered this Visit  Medications - No data to display  BP 129/78 (BP Location: Right Arm)   Pulse 66   Temp 98.5 F (36.9 C) (Oral)   Resp 17  No data found.   Physical Exam NURSES NOTES AND VITAL SIGNS REVIEWED. CONSTITUTIONAL: Well developed, well nourished, no acute distress HEENT: normocephalic, atraumatic EYES: Conjunctiva normal NECK:normal ROM, supple, tender swollen lymph nodes right > than left.  PULMONARY:No respiratory distress, normal effort ABDOMINAL: Soft, ND, NT BS+, No CVAT MUSCULOSKELETAL: Normal ROM of all extremities,  SKIN: warm and dry without rash PSYCHIATRIC: Mood and affect, behavior are normal  Urgent Care Course   Clinical Course    Procedures (including critical care time)  Labs Review Labs Reviewed - No data to display  Imaging Review No results found.   Visual Acuity Review  Right Eye Distance:   Left Eye Distance:   Bilateral Distance:    Right Eye Near:   Left Eye Near:    Bilateral Near:         MDM   1. Lymphadenitis, acute   2. Upper respiratory tract infection, unspecified type     Patient is reassured that there are no issues that require transfer to higher level of  care at this time or additional tests. Patient is advised to continue home symptomatic treatment. Patient is advised that if there are new or worsening symptoms to attend the emergency department, contact primary care provider, or return to UC. Instructions of care provided discharged home in stable condition.    THIS NOTE WAS GENERATED USING A VOICE RECOGNITION SOFTWARE PROGRAM. ALL REASONABLE EFFORTS  WERE MADE TO PROOFREAD THIS DOCUMENT FOR ACCURACY.  I have verbally reviewed the discharge instructions with the patient. A printed AVS was given to the patient.  All questions were answered prior to discharge.      Tharon AquasFrank C Waverly Chavarria,  PA 09/13/16 2117

## 2016-09-21 ENCOUNTER — Encounter: Payer: Self-pay | Admitting: Cardiovascular Disease

## 2016-09-21 ENCOUNTER — Ambulatory Visit (INDEPENDENT_AMBULATORY_CARE_PROVIDER_SITE_OTHER): Payer: BLUE CROSS/BLUE SHIELD | Admitting: Cardiovascular Disease

## 2016-09-21 VITALS — BP 108/90 | HR 76 | Ht 73.0 in | Wt 340.0 lb

## 2016-09-21 DIAGNOSIS — I10 Essential (primary) hypertension: Secondary | ICD-10-CM | POA: Diagnosis not present

## 2016-09-21 NOTE — Patient Instructions (Signed)
Medication Instructions:  Your physician recommends that you continue on your current medications as directed. Please refer to the Current Medication list given to you today.   Labwork: Your physician recommends that you return for lab work (basic metabolic panel) in: 6 months on the same day as your office visit    Testing/Procedures: None Ordered   Follow-Up: Your physician wants you to follow-up in: 6 months with Dr. Nahser.  You will receive a reminder letter in the mail two months in advance. If you don't receive a letter, please call our office to schedule the follow-up appointment.  If you need a refill on your cardiac medications before your next appointment, please call your pharmacy.   Thank you for choosing [facility]! Michelle Swinyer, RN [phone]   

## 2016-09-21 NOTE — Progress Notes (Signed)
Cardiology Office Note   Date:  09/21/2016   ID:  Shireen QuanDavid T Kendall, DOB 02/25/1976, MRN [id number]  PCP:  Jeanine Luzalone, Gregory, FNP  Cardiologist:   Kristeen MissPhilip Lashaunda Schild, MD   Chief Complaint  Patient presents with  . Hypertension   Problem List 1. Essential HTN 2. Obstructive sleep apnea 3, GERD    Previous notes:  Shireen QuanDavid T Shevlin is a 40 y.o. male who presents for evaluation of his HTN He has been on multiple meds with minimal response  Very busy at work .    Not a lot of time to exercise  Has been on a better diet for the past 1 year  or so.  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  No DOE doing fairly moderate exercise ( ie no dyspnea walking up the stadium steps at the [employer] game a month ago )  Has not lost any weight on his new diet.   Typically break fast - eggs, Malaw[place]urkey sausage /turkey bacon  Lunch - subway - 6 inch or salad   - used to eat lots of fried foods, [employer], chick fil a  Dinner - spag. Occasionally take out from [employer], [employer]   Apr 05, 2016:  [name] is seen back today for follow up of his HTN Echo shows normal LV function with moderate LAE  Has tried clonidine -  Was not effective so it was stopped  Hydralazine was substituted.  BP is still high  Has tried Bystolic - caused a severe headache Still drinking lots of sweet tea Lots of juices. Still eats out quite a bit .  Weight is 343 today .  I advised him to lose 5-10 lbs before his next visit .   May 09, 2016  weight is 341 today  BP is elevated.  Has OSA - has been using CPAP , may need some adjustment on the mask gasket  Has been working lots - worked 24 straight days  Avoiding salt    Oct. 18, 2017:  [name] is seen back for office visit for HTN Has done well on the Aldactone Feeling better.  Headaches have resolved,   Leg edema has resolved.   Past Medical History:  Diagnosis Date  .  Chicken pox   . GERD (gastroesophageal reflux disease)   . Hypertension   . Morbid obesity (HCC)   . Sleep apnea     No past surgical history on file.   Current Outpatient Prescriptions  Medication Sig Dispense Refill  . amoxicillin (AMOXIL) 500 MG capsule Take 1 capsule (500 mg total) by mouth 3 (three) times daily. 21 capsule 0  . atenolol (TENORMIN) 100 MG tablet Take 1 tablet (100 mg total) by mouth daily. 30 tablet 6  . hydrochlorothiazide (HYDRODIURIL) 25 MG tablet Take 1 tablet (25 mg total) by mouth daily. 30 tablet 11  . lisinopril (PRINIVIL,ZESTRIL) 20 MG tablet Take 1 tablet (20 mg total) by mouth daily. 30 tablet 11  . spironolactone (ALDACTONE) 25 MG tablet Take 6 tablets (150 mg total) by mouth daily. 180 tablet 11   No current facility-administered medications for this visit.     Allergies:   Amlodipine and Bystolic [nebivolol hcl]    Social History:  The patient  reports that he has never smoked. He has never used smokeless tobacco. He reports that he  does not drink alcohol or use drugs.   Family History:  The patient's family history includes Depression in his father; Healthy in his brother and sister; Hypertension in his father, maternal grandmother, and mother; Kidney disease in his maternal grandmother.    ROS:  Please see the history of present illness.    Review of Systems: Constitutional:  denies fever, chills, diaphoresis, appetite change and fatigue.  HEENT: denies photophobia, eye pain, redness, hearing loss, ear pain, congestion, sore throat, rhinorrhea, sneezing, neck pain, neck stiffness and tinnitus.  Respiratory: denies SOB, DOE, cough, chest tightness, and wheezing.  Cardiovascular: denies chest pain, palpitations and leg swelling.  Gastrointestinal: denies nausea, vomiting, abdominal pain, diarrhea, constipation, blood in stool.  Genitourinary: denies dysuria, urgency, frequency, hematuria, flank pain and difficulty urinating.  Musculoskeletal:  denies  myalgias, back pain, joint swelling, arthralgias and gait problem.   Skin: denies pallor, rash and wound.  Neurological: denies dizziness, seizures, syncope, weakness, light-headedness, numbness and headaches.   Hematological: denies adenopathy, easy bruising, personal or family bleeding history.  Psychiatric/ Behavioral: denies suicidal ideation, mood changes, confusion, nervousness, sleep disturbance and agitation.       All other systems are reviewed and negative.    PHYSICAL EXAM: VS:  BP 108/90 (BP Location: Left Arm, Patient Position: Sitting, Cuff Size: Large)   Pulse 76   Ht 6\' 1"  (1.854 m)   Wt (!) 340 lb (154.2 kg)   BMI 44.86 kg/m  , BMI Body mass index is 44.86 kg/m. GEN: Well nourished, well developed, in no acute distress  HEENT: normal  Neck: no JVD, carotid bruits, or masses Cardiac: RRR; no murmurs, rubs, or gallops,no edema  Respiratory:  clear to auscultation bilaterally, normal work of breathing GI: soft, nontender, nondistended, + BS MS: no deformity or atrophy  Skin: warm and dry, no rash Neuro:  Strength and sensation are intact Psych: normal   EKG:  EKG is ordered today. The ekg ordered 10/10/15 demonstrates NSR , NS ST abn.    Recent Labs: 10/19/2015: Hemoglobin 12.8; Platelets 311 08/30/2016: BUN 16; Creat 1.48; Potassium 4.4; Sodium 137    Lipid Panel    Component Value Date/Time   CHOL 168 10/07/2013 0817   TRIG 72 10/07/2013 0817   HDL 38 (L) 10/07/2013 0817   CHOLHDL 4.4 10/07/2013 0817   VLDL 14 10/07/2013 0817   LDLCALC 116 (H) 10/07/2013 0817      Wt Readings from Last 3 Encounters:  09/21/16 (!) 340 lb (154.2 kg)  08/30/16 (!) 338 lb (153.3 kg)  07/05/16 (!) 349 lb (158.3 kg)      Other studies Reviewed: Additional studies/ records that were reviewed today include: . Review of the above records demonstrates:    ASSESSMENT AND PLAN:  1.  Essential HTN- BP is better on the Aldactone Continue same meds.   2.  Obesity :   Weight loss - we have discussed the importance of weight loss again    Salt restriction - he does a fairly good job of avoiding salt  I've recommended that he buy a Omron BP cuff and keep a blood pressure log.   Is on Aldactone 150  mg a day -  2. Leg edema  better   3. Abn ECG Echo - normal LV function .   Mod LAE  I suspect is nonspecific ST changes are due to hypertension.  4. Obstructive sleep apnea     Current medicines are reviewed at length with the patient today.  The  patient does not have concerns regarding medicines.  The following changes have been made:  We'll discontinue metoprolol and try carvedilol 25 mg twice a day.  Labs/ tests ordered today include:   No orders of the defined types were placed in this encounter.   Disposition:   FU with me in 4-6 weeks    Kristeen Miss, MD  09/21/2016 5:18 PM    [facility] [address] [place], [place], [place]  [address] Phone: [phone]; Fax: [phone]

## 2016-09-29 ENCOUNTER — Ambulatory Visit: Payer: BLUE CROSS/BLUE SHIELD | Admitting: Family

## 2016-10-11 ENCOUNTER — Encounter: Payer: Self-pay | Admitting: Internal Medicine

## 2016-10-11 ENCOUNTER — Ambulatory Visit (INDEPENDENT_AMBULATORY_CARE_PROVIDER_SITE_OTHER): Payer: BLUE CROSS/BLUE SHIELD | Admitting: Internal Medicine

## 2016-10-11 VITALS — BP 124/76 | HR 90 | Temp 98.0°F | Resp 20 | Wt 335.0 lb

## 2016-10-11 DIAGNOSIS — I10 Essential (primary) hypertension: Secondary | ICD-10-CM | POA: Diagnosis not present

## 2016-10-11 DIAGNOSIS — J309 Allergic rhinitis, unspecified: Secondary | ICD-10-CM | POA: Diagnosis not present

## 2016-10-11 DIAGNOSIS — J029 Acute pharyngitis, unspecified: Secondary | ICD-10-CM | POA: Insufficient documentation

## 2016-10-11 MED ORDER — PREDNISONE 10 MG PO TABS: ORAL_TABLET | ORAL | 0 refills | Status: DC

## 2016-10-11 NOTE — Progress Notes (Signed)
   Subjective:    Patient ID: Robert Cruz, male    DOB: 09/02/1976, 40 y.o.   MRN: [id number]  HPI  Here to f/u - Does have several wks ongoing nasal allergy symptoms with clearish congestion, itch and sneezing, and mod ST, without fever, othr pain, cough, swelling or wheezing.  Pt denies new neurological symptoms such as new headache, or facial or extremity weakness or numbness   Pt denies polydipsia, polyuria, Past Medical History:  Diagnosis Date  . Chicken pox   . GERD (gastroesophageal reflux disease)   . Hypertension   . Morbid obesity (HCC)   . Sleep apnea    No past surgical history on file.  reports that he has never smoked. He has never used smokeless tobacco. He reports that he does not drink alcohol or use drugs. family history includes Depression in his father; Healthy in his brother and sister; Hypertension in his father, maternal grandmother, and mother; Kidney disease in his maternal grandmother. Allergies  Allergen Reactions  . Amlodipine Other (See Comments)    edema  . Bystolic [Nebivolol Hcl] Other (See Comments)    Patient stated medication gave him major headaches   Current Outpatient Prescriptions on File Prior to Visit  Medication Sig Dispense Refill  . atenolol (TENORMIN) 100 MG tablet Take 1 tablet (100 mg total) by mouth daily. 30 tablet 6  . hydrochlorothiazide (HYDRODIURIL) 25 MG tablet Take 1 tablet (25 mg total) by mouth daily. 30 tablet 11  . lisinopril (PRINIVIL,ZESTRIL) 20 MG tablet Take 1 tablet (20 mg total) by mouth daily. 30 tablet 11  . spironolactone (ALDACTONE) 25 MG tablet Take 6 tablets (150 mg total) by mouth daily. 180 tablet 11   No current facility-administered medications on file prior to visit.    Review of Systems All otherwise neg per pt     Objective:   Physical Exam BP 124/76   Pulse 90   Temp 98 F (36.7 C) (Oral)   Resp 20   Wt (!) 335 lb (152 kg)   SpO2 97%   BMI 44.20 kg/m  VS noted,  Constitutional: Pt  appears in no apparent distress HENT: Head: NCAT.  Right Ear: External ear normal.  Left Ear: External ear normal.  Eyes: . Pupils are equal, round, and reactive to light. Conjunctivae and EOM are normal Bilat tm's with mild erythema.  Max sinus areas non tender.  Pharynx with mild erythema, no exudate Neck: Normal range of motion. Neck supple.  Cardiovascular: Normal rate and regular rhythm.   Pulmonary/Chest: Effort normal and breath sounds without rales or wheezing.  Neurological: Pt is alert. Not confused , motor grossly intact Skin: Skin is warm. No rash, no LE edema Psychiatric: Pt behavior is normal. No agitation.     Assessment & Plan:

## 2016-10-11 NOTE — Progress Notes (Signed)
Pre visit review using our clinic review tool, if applicable. No additional management support is needed unless otherwise documented below in the visit note. 

## 2016-10-11 NOTE — Patient Instructions (Signed)
You had the steroid shot today  Please take all new medication as prescribed - the prednisone  Please also take Zyrtec 10 mg daily as needed, as well as Nasacort AQ OTC nasal spray for best results as well  Please continue all other medications as before, and refills have been done if requested.  Please have the pharmacy call with any other refills you may need.  Please keep your appointments with your specialists as you may have planned

## 2016-10-16 NOTE — Assessment & Plan Note (Signed)
Mild to mod seasonal flare, for depomedrol IM 80, predpac asd,  to f/u any worsening symptoms or concerns

## 2016-10-16 NOTE — Assessment & Plan Note (Signed)
No evidence for infection, for tx as above, most likely related to post nasal gtt

## 2016-10-16 NOTE — Assessment & Plan Note (Signed)
stable overall by history and exam, recent data reviewed with pt, and pt to continue medical treatment as before,  to f/u any worsening symptoms or concerns BP Readings from Last 3 Encounters:  10/11/16 124/76  09/21/16 108/90  09/12/16 129/78

## 2016-11-21 ENCOUNTER — Ambulatory Visit: Payer: BLUE CROSS/BLUE SHIELD | Admitting: Family

## 2016-12-14 ENCOUNTER — Telehealth: Payer: Self-pay | Admitting: Cardiovascular Disease

## 2016-12-14 NOTE — Telephone Encounter (Signed)
°  Follow Up   Pt is calling to follow up on faxes [facility] us [facility] [facility]. Pt states faxes were sent throughout this week. Please call.

## 2016-12-14 NOTE — Telephone Encounter (Signed)
Patient called asking if we had received faxes that were sent [facility] to us [facility] [facility]. I told him that I was unable to locate these faxes at this time, but that did not mean that we didn't have them, that I would route this message to Dr. Harvie BridgeNahser's nurse and she would call him back when she returns to work. Patient stated that he wants to be called back on his cell at [phone]. Patient stated that he was in agreement with this plan.

## 2016-12-16 MED ORDER — HYDROCHLOROTHIAZIDE 25 MG PO TABS: 25.0000 mg | ORAL_TABLET | Freq: Every day | ORAL | 3 refills | Status: DC

## 2016-12-16 MED ORDER — SPIRONOLACTONE 25 MG PO TABS: 150.0000 mg | ORAL_TABLET | Freq: Every day | ORAL | 3 refills | Status: DC

## 2016-12-16 MED ORDER — ATENOLOL 100 MG PO TABS: 100.0000 mg | ORAL_TABLET | Freq: Every day | ORAL | 3 refills | Status: DC

## 2016-12-16 MED ORDER — LISINOPRIL 20 MG PO TABS: 20.0000 mg | ORAL_TABLET | Freq: Every day | ORAL | 3 refills | Status: DC

## 2016-12-16 NOTE — Telephone Encounter (Signed)
Left detailed message for patient to call back or to call [employer] to refax forms to [phone] because I have not received anything.

## 2016-12-16 NOTE — Telephone Encounter (Signed)
Spoke with patient who requests #90 supply of his medications be sent to [facility] [address].  He states he received a notification from his insurance company that our office denied [facility] and [facility]. I advised I have [facility] to [facility] per his request and to call back with questions or concerns.  He thanked me for the call.

## 2017-01-23 ENCOUNTER — Ambulatory Visit (INDEPENDENT_AMBULATORY_CARE_PROVIDER_SITE_OTHER): Payer: BLUE CROSS/BLUE SHIELD | Admitting: Family

## 2017-01-23 ENCOUNTER — Encounter: Payer: Self-pay | Admitting: Family

## 2017-01-23 VITALS — BP 122/78 | HR 96 | Temp 97.6°F | Ht 73.0 in | Wt 326.0 lb

## 2017-01-23 DIAGNOSIS — Z0000 Encounter for general adult medical examination without abnormal findings: Secondary | ICD-10-CM

## 2017-01-23 DIAGNOSIS — I10 Essential (primary) hypertension: Secondary | ICD-10-CM | POA: Diagnosis not present

## 2017-01-23 NOTE — Assessment & Plan Note (Signed)
Hypertension appears adequately controlled current medication regimen and no adverse side effects. Denies worst headache of life with no new symptoms of end organ damage noted physical exam. Continue current dosage of atenolol, hydrochlorothiazide, Cipro, and spironolactone. Encouraged to monitor blood pressure at home and follow low-sodium diet.

## 2017-01-23 NOTE — Progress Notes (Signed)
Subjective:    Patient ID: Robert Cruz, male    DOB: [birth year], 41 y.o.   MRN: [id number]  Chief Complaint  Patient presents with  . Annual Exam    HPI:  Robert Cruz is a 41 y.o. male who presents today for an annual wellness visit.   1) Health Maintenance -   Diet - Averages about 2-3 meals per day consisting of a regular diet; Has been adding smoothies into his meal planning. Occasional caffeine.  Exercise - No structured exercise.   2) Preventative Exams / Immunizations:  Dental -- Due for exam  Vision -- Up to date   Health Maintenance  Topic Date Due  . HIV Screening  01/07/1991  . INFLUENZA VACCINE  07/05/2016  . TETANUS/TDAP  01/05/2017    Immunization History  Administered Date(s) Administered  . Influenza Split 09/06/2013, 09/05/2015  . Td 01/05/2007     Allergies  Allergen Reactions  . Amlodipine Other (See Comments)    edema  . Bystolic [Nebivolol Hcl] Other (See Comments)    Patient stated medication gave him major headaches     Outpatient Medications Prior to Visit  Medication Sig Dispense Refill  . atenolol (TENORMIN) 100 MG tablet Take 1 tablet (100 mg total) by mouth daily. 90 tablet 3  . hydrochlorothiazide (HYDRODIURIL) 25 MG tablet Take 1 tablet (25 mg total) by mouth daily. 90 tablet 3  . lisinopril (PRINIVIL,ZESTRIL) 20 MG tablet Take 1 tablet (20 mg total) by mouth daily. 90 tablet 3  . spironolactone (ALDACTONE) 25 MG tablet Take 6 tablets (150 mg total) by mouth daily. 540 tablet 3  . predniSONE (DELTASONE) 10 MG tablet 3 tabs by mouth per day for 3 days,2tabs per day for 3 days,1tab per day for 3 days 18 tablet 0   No facility-administered medications prior to visit.      Past Medical History:  Diagnosis Date  . Chicken pox   . GERD (gastroesophageal reflux disease)   . Hypertension   . Morbid obesity (HCC)   . Sleep apnea      No past surgical history on file.   Family History  Problem Relation Age of  Onset  . Hypertension Father   . Depression Father   . Hypertension Mother   . Kidney disease Maternal Grandmother     Dialysis  . Hypertension Maternal Grandmother   . Healthy Brother     x1  . Healthy Sister     x3  . Colon cancer Neg Hx   . Esophageal cancer Neg Hx   . Rectal cancer Neg Hx   . Stomach cancer Neg Hx      Social History   Social History  . Marital status: Significant Other    Spouse name: N/A  . Number of children: 5  . Years of education: 89   Occupational History  . [name]   Social History Main Topics  . Smoking status: Never Smoker  . Smokeless tobacco: Never Used  . Alcohol use No  . Drug use: No  . Sexual activity: Not on file   Other Topics Concern  . Not on file   Social History Narrative  . No narrative on file      Review of Systems  Constitutional: Denies fever, chills, fatigue, or significant weight gain/loss. HENT: Head: Denies headache or neck pain Ears: Denies changes in hearing, ringing in ears, earache, drainage Nose: Denies discharge, stuffiness, itching, nosebleed, sinus pain Throat: Denies sore throat,  hoarseness, dry mouth, sores, thrush Eyes: Denies loss/changes in vision, pain, redness, blurry/double vision, flashing lights Cardiovascular: Denies chest pain/discomfort, tightness, palpitations, shortness of breath with activity, difficulty lying down, swelling, sudden awakening with shortness of breath Respiratory: Denies shortness of breath, cough, sputum production, wheezing Gastrointestinal: Denies dysphasia, heartburn, change in appetite, nausea, change in bowel habits, rectal bleeding, constipation, diarrhea, yellow skin or eyes Genitourinary: Denies frequency, urgency, burning/pain, blood in urine, incontinence, change in urinary strength. Musculoskeletal: Denies muscle/joint pain, stiffness, back pain, redness or swelling of joints, trauma Skin: Denies rashes, lumps, itching, dryness, color  changes, or hair/nail changes Neurological: Denies dizziness, fainting, seizures, weakness, numbness, tingling, tremor Psychiatric - Denies nervousness, stress, depression or memory loss Endocrine: Denies heat or cold intolerance, sweating, frequent urination, excessive thirst, changes in appetite Hematologic: Denies ease of bruising or bleeding     Objective:     BP 122/78   Pulse 96   Temp 97.6 F (36.4 C)   Ht 6\' 1"  (1.854 m)   Wt (!) 326 lb (147.9 kg)   SpO2 98%   BMI 43.01 kg/m  Nursing note and vital signs reviewed.  Physical Exam  Constitutional: He is oriented to person, place, and time. He appears well-developed and well-nourished.  HENT:  Head: Normocephalic.  Right Ear: Hearing, tympanic membrane, external ear and ear canal normal.  Left Ear: Hearing, tympanic membrane, external ear and ear canal normal.  Nose: Nose normal.  Mouth/Throat: Uvula is midline, oropharynx is clear and moist and mucous membranes are normal.  Eyes: Conjunctivae and EOM are normal. Pupils are equal, round, and reactive to light.  Neck: Neck supple. No JVD present. No tracheal deviation present. No thyromegaly present.  Cardiovascular: Normal rate, regular rhythm, normal heart sounds and intact distal pulses.   Pulmonary/Chest: Effort normal and breath sounds normal.  Abdominal: Soft. Bowel sounds are normal. He exhibits no distension and no mass. There is no tenderness. There is no rebound and no guarding.  Musculoskeletal: Normal range of motion. He exhibits no edema or tenderness.  Lymphadenopathy:    He has no cervical adenopathy.  Neurological: He is alert and oriented to person, place, and time. He has normal reflexes. No cranial nerve deficit. He exhibits normal muscle tone. Coordination normal.  Skin: Skin is warm and dry.  Psychiatric: He has a normal mood and affect. His behavior is normal. Judgment and thought content normal.       Assessment & Plan:   Problem List Items  Addressed This Visit      Cardiovascular and Mediastinum   Essential hypertension    Hypertension appears adequately controlled current medication regimen and no adverse side effects. Denies worst headache of life with no new symptoms of end organ damage noted physical exam. Continue current dosage of atenolol, hydrochlorothiazide, Cipro, and spironolactone. Encouraged to monitor blood pressure at home and follow low-sodium diet.        Other   Severe obesity (BMI >= 40) (HCC)    Has lost weight recently. Recommend weight loss of 5-10% of current body weight through nutrition and physical activity. Encouraged a nutritional intake that is moderate, varied, and balanced and low in saturated fats and processed/sugary foods. Recommended exercise with goal of 30 minutes of moderate level activity daily. Continue to monitor.      Encounter for preventive health examination - Primary    1) Anticipatory Guidance: Discussed importance of wearing a seatbelt while driving and not texting while driving; changing batteries in smoke detector at  least once annually; wearing suntan lotion when outside; eating a balanced and moderate diet; getting physical activity at least 30 minutes per day.  2) Immunizations / Screenings / Labs:  Declines influenza. All other immunizations are up to date per recommendations. Due for a dental exam encouraged to be completed independently. All other screenings are up-to-date per recommendations. Obtain CBC, A1c, CMET, and lipid profile.    Overall well exam with risk factors for cardiovascular disease including hypertension, sleep apnea, and morbid obesity. Chronic conditions appear adequately controlled through medication regimens with no adverse side effects. Has lost weight and continues to lose weight with nutrition and working on increasing physical activity. Continue healthy lifestyle behaviors and choices. Follow-up prevention exam in 1 year. Follow-up office visit  pending blood work and for chronic conditions.      Relevant Orders   CBC   Comprehensive metabolic panel   Lipid panel   Hemoglobin A1c       I have discontinued Robert Cruz's predniSONE. I am also having him maintain his atenolol, hydrochlorothiazide, lisinopril, spironolactone, and terbinafine.   Meds ordered this encounter  Medications  . terbinafine (LAMISIL) 250 MG tablet     Follow-up: Return in about 1 year (around 01/23/2018), or if symptoms worsen or fail to improve.   Robert Cruz, Robert Rieman, FNP

## 2017-01-23 NOTE — Assessment & Plan Note (Signed)
1) Anticipatory Guidance: Discussed importance of wearing a seatbelt while driving and not texting while driving; changing batteries in smoke detector at least once annually; wearing suntan lotion when outside; eating a balanced and moderate diet; getting physical activity at least 30 minutes per day.  2) Immunizations / Screenings / Labs:  Declines influenza. All other immunizations are up to date per recommendations. Due for a dental exam encouraged to be completed independently. All other screenings are up-to-date per recommendations. Obtain CBC, A1c, CMET, and lipid profile.    Overall well exam with risk factors for cardiovascular disease including hypertension, sleep apnea, and morbid obesity. Chronic conditions appear adequately controlled through medication regimens with no adverse side effects. Has lost weight and continues to lose weight with nutrition and working on increasing physical activity. Continue healthy lifestyle behaviors and choices. Follow-up prevention exam in 1 year. Follow-up office visit pending blood work and for chronic conditions.

## 2017-01-23 NOTE — Assessment & Plan Note (Signed)
Has lost weight recently. Recommend weight loss of 5-10% of current body weight through nutrition and physical activity. Encouraged a nutritional intake that is moderate, varied, and balanced and low in saturated fats and processed/sugary foods. Recommended exercise with goal of 30 minutes of moderate level activity daily. Continue to monitor.

## 2017-01-23 NOTE — Patient Instructions (Signed)
Thank you for choosing [facility].  SUMMARY AND INSTRUCTIONS:  Congratulations on the weight loss - keep up the great work!  Medication:  Continue to take your medication as prescribed.   Your prescription(s) have been submitted to your pharmacy or been printed and provided for you. Please take as directed and contact our office if you believe you are having problem(s) with the medication(s) or have any questions.  Labs:  Please stop by the lab on the lower level of the building for your blood work. Your results will be released to MyChart (or called to you) after review, usually within 72 hours after test completion. If any changes need to be made, you will be notified at that same time.  1.) The lab is open from 7:30am to 5:30 pm Monday-Friday 2.) No appointment is necessary 3.) Fasting (if needed) is 6-8 hours after food and drink; black coffee and water are okay   Follow up:  If your symptoms worsen or fail to improve, please contact our office for further instruction, or in case of emergency go directly to the emergency room at the closest medical facility.   Health Maintenance, Male A healthy lifestyle and preventative care can promote health and wellness.  Maintain regular health, dental, and eye exams.  Eat a healthy diet. Foods like vegetables, fruits, whole grains, low-fat dairy products, and lean protein foods contain the nutrients you need and are low in calories. Decrease your intake of foods high in solid fats, added sugars, and salt. Get information about a proper diet from your health care provider, if necessary.  Regular physical exercise is one of the most important things you can do for your health. Most adults should get at least 150 minutes of moderate-intensity exercise (any activity that increases your heart rate and causes you to sweat) each week. In addition, most adults need muscle-strengthening exercises on 2 or more days a week.   Maintain a  healthy weight. The body mass index (BMI) is a screening tool to identify possible weight problems. It provides an estimate of body fat based on height and weight. Your health care provider can find your BMI and can help you achieve or maintain a healthy weight. For males 20 years and older:  A BMI below 18.5 is considered underweight.  A BMI of 18.5 to 24.9 is normal.  A BMI of 25 to 29.9 is considered overweight.  A BMI of 30 and above is considered obese.  Maintain normal blood lipids and cholesterol by exercising and minimizing your intake of saturated fat. Eat a balanced diet with plenty of fruits and vegetables. Blood tests for lipids and cholesterol should begin at age 41 and be repeated every 5 years. If your lipid or cholesterol levels are high, you are over age 41, or you are at high risk for heart disease, you may need your cholesterol levels checked more frequently.Ongoing high lipid and cholesterol levels should be treated with medicines if diet and exercise are not working.  If you smoke, find out from your health care provider how to quit. If you do not use tobacco, do not start.  Lung cancer screening is recommended for adults aged 55-80 years who are at high risk for developing lung cancer because of a history of smoking. A yearly low-dose CT scan of the lungs is recommended for people who have at least a 30-pack-year history of smoking and are current smokers or have quit within the past 15 years. A pack year of  smoking is smoking an average of 1 pack of cigarettes a day for 1 year (for example, a 30-pack-year history of smoking could mean smoking 1 pack a day for 30 years or 2 packs a day for 15 years). Yearly screening should continue until the smoker has stopped smoking for at least 15 years. Yearly screening should be stopped for people who develop a health problem that would prevent them from having lung cancer treatment.  If you choose to drink alcohol, do not have more than  2 drinks per day. One drink is considered to be 12 oz (360 mL) of beer, 5 oz (150 mL) of wine, or 1.5 oz (45 mL) of liquor.  Avoid the use of street drugs. Do not share needles with anyone. Ask for help if you need support or instructions about stopping the use of drugs.  High blood pressure causes heart disease and increases the risk of stroke. High blood pressure is more likely to develop in:  People who have blood pressure in the end of the normal range (100-139/85-89 mm Hg).  People who are overweight or obese.  People who are African American.  If you are 29-47 years of age, have your blood pressure checked every 3-5 years. If you are 72 years of age or older, have your blood pressure checked every year. You should have your blood pressure measured twice-once when you are at a hospital or clinic, and once when you are not at a hospital or clinic. Record the average of the two measurements. To check your blood pressure when you are not at a hospital or clinic, you can use:  An automated blood pressure machine at a pharmacy.  A home blood pressure monitor.  If you are 40-69 years old, ask your health care provider if you should take aspirin to prevent heart disease.  Diabetes screening involves taking a blood sample to check your fasting blood sugar level. This should be done once every 3 years after age 56 if you are at a normal weight and without risk factors for diabetes. Testing should be considered at a younger age or be carried out more frequently if you are overweight and have at least 1 risk factor for diabetes.  Colorectal cancer can be detected and often prevented. Most routine colorectal cancer screening begins at the age of 86 and continues through age 28. However, your health care provider may recommend screening at an earlier age if you have risk factors for colon cancer. On a yearly basis, your health care provider may provide home test kits to check for hidden blood in the  stool. A small camera at the end of a tube may be used to directly examine the colon (sigmoidoscopy or colonoscopy) to detect the earliest forms of colorectal cancer. Talk to your health care provider about this at age 49 when routine screening begins. A direct exam of the colon should be repeated every 5-10 years through age 73, unless early forms of precancerous polyps or small growths are found.  People who are at an increased risk for hepatitis B should be screened for this virus. You are considered at high risk for hepatitis B if:  You were born in a country where hepatitis B occurs often. Talk with your health care provider about which countries are considered high risk.  Your parents were born in a high-risk country and you have not received a shot to protect against hepatitis B (hepatitis B vaccine).  You have HIV or AIDS.  You use needles to inject street drugs.  You live with, or have sex with, someone who has hepatitis B.  You are a man who has sex with other men (MSM).  You get hemodialysis treatment.  You take certain medicines for conditions like cancer, organ transplantation, and autoimmune conditions.  Hepatitis C blood testing is recommended for all people born from [birth year] through 1965 and any individual with known risk factors for hepatitis C.  Healthy men should no longer receive prostate-specific antigen (PSA) blood tests as part of routine cancer screening. Talk to your health care provider about prostate cancer screening.  Testicular cancer screening is not recommended for adolescents or adult males who have no symptoms. Screening includes self-exam, a health care provider exam, and other screening tests. Consult with your health care provider about any symptoms you have or any concerns you have about testicular cancer.  Practice safe sex. Use condoms and avoid high-risk sexual practices to reduce the spread of sexually transmitted infections (STIs).  You should be  screened for STIs, including gonorrhea and chlamydia if:  You are sexually active and are younger than 24 years.  You are older than 24 years, and your health care provider tells you that you are at risk for this type of infection.  Your sexual activity has changed since you were last screened, and you are at an increased risk for chlamydia or gonorrhea. Ask your health care provider if you are at risk.  If you are at risk of being infected with HIV, it is recommended that you take a prescription medicine daily to prevent HIV infection. This is called pre-exposure prophylaxis (PrEP). You are considered at risk if:  You are a man who has sex with other men (MSM).  You are a heterosexual man who is sexually active with multiple partners.  You take drugs by injection.  You are sexually active with a partner who has HIV.  Talk with your health care provider about whether you are at high risk of being infected with HIV. If you choose to begin PrEP, you should first be tested for HIV. You should then be tested every 3 months for as long as you are taking PrEP.  Use sunscreen. Apply sunscreen liberally and repeatedly throughout the day. You should seek shade when your shadow is shorter than you. Protect yourself by wearing long sleeves, pants, a wide-brimmed hat, and sunglasses year round whenever you are outdoors.  Tell your health care provider of new moles or changes in moles, especially if there is a change in shape or color. Also, tell your health care provider if a mole is larger than the size of a pencil eraser.  A one-time screening for abdominal aortic aneurysm (AAA) and surgical repair of large AAAs by ultrasound is recommended for men aged 65-75 years who are current or former smokers.  Stay current with your vaccines (immunizations). This information is not intended to replace advice given to you by your health care provider. Make sure you discuss any questions you have with your  health care provider. Document Released: 05/19/2008 Document Revised: 12/12/2014 Document Reviewed: 08/25/2015 Elsevier Interactive Patient Education  2017 [employer].

## 2017-03-07 ENCOUNTER — Encounter: Payer: Self-pay | Admitting: Cardiovascular Disease

## 2017-03-22 ENCOUNTER — Other Ambulatory Visit: Payer: BLUE CROSS/BLUE SHIELD

## 2017-03-22 ENCOUNTER — Ambulatory Visit: Payer: BLUE CROSS/BLUE SHIELD | Admitting: Cardiovascular Disease

## 2017-03-27 ENCOUNTER — Other Ambulatory Visit: Payer: Self-pay | Admitting: Cardiovascular Disease

## 2017-04-13 ENCOUNTER — Emergency Department (HOSPITAL_BASED_OUTPATIENT_CLINIC_OR_DEPARTMENT_OTHER): Payer: BLUE CROSS/BLUE SHIELD

## 2017-04-13 ENCOUNTER — Encounter (HOSPITAL_BASED_OUTPATIENT_CLINIC_OR_DEPARTMENT_OTHER): Payer: Self-pay | Admitting: *Deleted

## 2017-04-13 ENCOUNTER — Emergency Department (HOSPITAL_BASED_OUTPATIENT_CLINIC_OR_DEPARTMENT_OTHER)
Admission: EM | Admit: 2017-04-13 | Discharge: 2017-04-13 | Disposition: A | Discharge status: Home / Self Care | Payer: BLUE CROSS/BLUE SHIELD | Attending: Emergency Medicine | Admitting: Emergency Medicine

## 2017-04-13 DIAGNOSIS — S99911A Unspecified injury of right ankle, initial encounter: Secondary | ICD-10-CM | POA: Insufficient documentation

## 2017-04-13 DIAGNOSIS — W1842XA Slipping, tripping and stumbling without falling due to stepping into hole or opening, initial encounter: Secondary | ICD-10-CM | POA: Insufficient documentation

## 2017-04-13 DIAGNOSIS — X58XXXA Exposure to other specified factors, initial encounter: Secondary | ICD-10-CM | POA: Insufficient documentation

## 2017-04-13 DIAGNOSIS — S9032XA Contusion of left foot, initial encounter: Secondary | ICD-10-CM

## 2017-04-13 DIAGNOSIS — Y999 Unspecified external cause status: Secondary | ICD-10-CM | POA: Insufficient documentation

## 2017-04-13 DIAGNOSIS — S93601A Unspecified sprain of right foot, initial encounter: Secondary | ICD-10-CM

## 2017-04-13 DIAGNOSIS — I10 Essential (primary) hypertension: Secondary | ICD-10-CM | POA: Insufficient documentation

## 2017-04-13 DIAGNOSIS — Y939 Activity, unspecified: Secondary | ICD-10-CM | POA: Insufficient documentation

## 2017-04-13 DIAGNOSIS — Y929 Unspecified place or not applicable: Secondary | ICD-10-CM | POA: Insufficient documentation

## 2017-04-13 MED ORDER — HYDROCODONE-ACETAMINOPHEN 5-325 MG PO TABS
1.0000 | ORAL_TABLET | Freq: Once | ORAL | Status: AC
  Administered 2017-04-13: 1 via ORAL
  Filled 2017-04-13: qty 1

## 2017-04-13 MED ORDER — OXYCODONE-ACETAMINOPHEN 5-325 MG PO TABS: 1.0000 | ORAL_TABLET | Freq: Four times a day (QID) | ORAL | 0 refills | Status: DC | PRN

## 2017-04-13 NOTE — ED Triage Notes (Signed)
Stepped in hole last pm 1700,  Twisted rt ankle,  Now has pain to rt ankle, rt foot and left heel

## 2017-04-13 NOTE — ED Provider Notes (Signed)
[facility] Provider Note: Robert DellJ. Lane Rether Rison, MD, FACEP  CSN: [id number] MRN: [id number] ARRIVAL: 04/13/17 at 0259 ROOM: MH09/MH09   CHIEF COMPLAINT  Leg Injury   HISTORY OF PRESENT ILLNESS  Robert Cruz is a 41 y.o. male who stepped into a hole with his right leg yesterday afternoon about 5 PM. There was not significant pain at the time but he has subsequently developed severe pain in the right ankle and right foot, worse with palpation, movement or attempted weightbearing. He is not able to bear weight on that leg due to pain. There is no obvious deformity or significant swelling associated with it. He is also having pain in his left heel. He denies other injury. He has not taken any medication for the pain. He did try applying ice earlier.  [facility] [place] [facility] [place] reveals the patient has received no opioid prescriptions in the past year.   Past Medical History:  Diagnosis Date  . Chicken pox   . GERD (gastroesophageal reflux disease)   . Hypertension   . Morbid obesity (HCC)   . Sleep apnea     History reviewed. No pertinent surgical history.  Family History  Problem Relation Age of Onset  . Hypertension Father   . Depression Father   . Hypertension Mother   . Kidney disease Maternal Grandmother        Dialysis  . Hypertension Maternal Grandmother   . Healthy Brother        x1  . Healthy Sister        x3  . Colon cancer Neg Hx   . Esophageal cancer Neg Hx   . Rectal cancer Neg Hx   . Stomach cancer Neg Hx     Social History  Substance Use Topics  . Smoking status: Never Smoker  . Smokeless tobacco: Never Used  . Alcohol use No    Prior to Admission medications   Medication Sig Start Date End Date Taking? Authorizing Provider  atenolol (TENORMIN) 100 MG tablet Take 1 tablet (100 mg total) by mouth daily. 12/16/16   Nahser, Deloris PingPhilip J, MD  hydrochlorothiazide (HYDRODIURIL) 25 MG tablet Take 1 tablet  (25 mg total) by mouth daily. 12/16/16   Nahser, Deloris PingPhilip J, MD  lisinopril (PRINIVIL,ZESTRIL) 20 MG tablet Take 1 tablet (20 mg total) by mouth daily. 12/16/16 03/16/17  Nahser, Deloris PingPhilip J, MD  spironolactone (ALDACTONE) 25 MG tablet Take 6 tablets (150 mg total) by mouth daily. 12/16/16   Nahser, Deloris PingPhilip J, MD  terbinafine (LAMISIL) 250 MG tablet  12/24/16   [provider]    Allergies Amlodipine and Bystolic [nebivolol hcl]   REVIEW OF SYSTEMS  Negative except as noted here or in the History of Present Illness.   PHYSICAL EXAMINATION  Initial Vital Signs Blood pressure 112/62, pulse 84, temperature 98.4 F (36.9 C), temperature source Oral, resp. rate (!) 22, height 6\' 1"  (1.854 m), weight 300 lb (136.1 kg), SpO2 99 %.  Examination General: Well-developed, well-nourished male in no acute distress; appearance consistent with age of record HENT: normocephalic; atraumatic Eyes: pupils equal, round and reactive to light; extraocular muscles intact Neck: supple Heart: regular rate and rhythm Lungs: clear to auscultation bilaterally Abdomen: soft; nondistended; nontender; bowel sounds present Extremities: No deformity; full range of motion except right ankle and foot; pulses normal; tenderness of right ankle and foot with mild lateral ankle swelling; tenderness of left heel Neurologic: Awake, alert and oriented; motor function intact in all  extremities and symmetric; no facial droop Skin: Warm and dry Psychiatric: Normal mood and affect   RESULTS  Summary of this visit's results, reviewed by myself:   EKG Interpretation  Date/Time:    Ventricular Rate:    PR Interval:    QRS Duration:   QT Interval:    QTC Calculation:   R Axis:     Text Interpretation:        Laboratory Studies: No results found for this or any previous visit (from the past 24 hour(s)). Imaging Studies: No results found.  ED COURSE  Nursing notes and initial vitals signs, including pulse oximetry,  reviewed.  Vitals:   04/13/17 0312  BP: 112/62  Pulse: 84  Resp: (!) 22  Temp: 98.4 F (36.9 C)  TempSrc: Oral  SpO2: 99%  Weight: 300 lb (136.1 kg)  Height: 6\' 1"  (1.854 m)   Radiographs of right ankle, right foot and left heel negative for acute fracture.  PROCEDURES    ED DIAGNOSES     ICD-9-CM ICD-10-CM   1. Ankle injury, right, initial encounter 959.7 S99.911A   2. Right foot sprain, initial encounter 845.10 S93.601A   3. Contusion of foot or heel, left, initial encounter 924.20 S90.32XA        Mekaylah Klich, Jonny Ruiz, MD 04/13/17 [phone]

## 2017-04-14 MED FILL — OXYCODONE/APAP 5/325 MG TAB: 5-325 | 5 days supply | Qty: 20 | Fill #0

## 2017-05-05 ENCOUNTER — Telehealth: Payer: Self-pay | Admitting: Family

## 2017-05-05 MED ORDER — SPIRONOLACTONE 100 MG PO TABS: ORAL_TABLET | ORAL | 2 refills | Status: DC

## 2017-05-05 MED ORDER — ATENOLOL 100 MG PO TABS: 100.0000 mg | ORAL_TABLET | Freq: Every day | ORAL | 2 refills | Status: DC

## 2017-05-05 NOTE — Telephone Encounter (Signed)
Patient is requesting refill on atenolol.  Patient also states that pharmacy has spironolactone 25mg  on back order.  States that they only have 100 mg.  States pharmacy would like an ok from provider to fill this for patient.   Lost call before I could verify pharmacy.  Tried to call patient could not get answer.  Left message stating that we will use pharmacy on file and if this is not correct to give our office a call back.

## 2017-05-05 NOTE — Telephone Encounter (Signed)
LVM for pt to call back in regards.  

## 2017-05-05 NOTE — Addendum Note (Signed)
Addended by: Mercer PodWRENN, Siah Kannan E on: 05/05/2017 04:51 PM   Modules accepted: Orders

## 2017-05-05 NOTE — Telephone Encounter (Signed)
Patient called back.  Please follow back up in regard.

## 2017-05-05 NOTE — Telephone Encounter (Signed)
Ok to change spiranolactone to 100 mg tablet with current dosage of 150 mg daily. Should have plenty of refills for atenolol based on previous prescription. Ok to send in if needed.

## 2017-05-05 NOTE — Telephone Encounter (Signed)
Medications have been sent in. 

## 2017-05-05 NOTE — Telephone Encounter (Signed)
Patient uses [facility] [address]. Please follow up with patient once sent.

## 2017-05-08 ENCOUNTER — Other Ambulatory Visit: Payer: Self-pay | Admitting: Emergency Medicine

## 2017-05-08 MED ORDER — ATENOLOL 100 MG PO TABS: 100.0000 mg | ORAL_TABLET | Freq: Every day | ORAL | 2 refills | Status: DC

## 2017-07-11 ENCOUNTER — Encounter: Payer: Self-pay | Admitting: Cardiovascular Disease

## 2017-07-14 ENCOUNTER — Encounter (HOSPITAL_BASED_OUTPATIENT_CLINIC_OR_DEPARTMENT_OTHER): Payer: Self-pay | Admitting: Emergency Medicine

## 2017-07-14 ENCOUNTER — Emergency Department (HOSPITAL_BASED_OUTPATIENT_CLINIC_OR_DEPARTMENT_OTHER)
Admission: EM | Admit: 2017-07-14 | Discharge: 2017-07-14 | Disposition: A | Discharge status: Home / Self Care | Payer: Managed Care, Other (non HMO) | Attending: Emergency Medicine | Admitting: Emergency Medicine

## 2017-07-14 DIAGNOSIS — H1031 Unspecified acute conjunctivitis, right eye: Secondary | ICD-10-CM | POA: Diagnosis not present

## 2017-07-14 DIAGNOSIS — I10 Essential (primary) hypertension: Secondary | ICD-10-CM | POA: Insufficient documentation

## 2017-07-14 DIAGNOSIS — Z79899 Other long term (current) drug therapy: Secondary | ICD-10-CM | POA: Diagnosis not present

## 2017-07-14 DIAGNOSIS — H10021 Other mucopurulent conjunctivitis, right eye: Secondary | ICD-10-CM | POA: Diagnosis present

## 2017-07-14 MED ORDER — ERYTHROMYCIN 5 MG/GM OP OINT
TOPICAL_OINTMENT | Freq: Once | OPHTHALMIC | Status: AC
Start: 2017-07-14 — End: 2017-07-14
  Administered 2017-07-14: 1 via OPHTHALMIC
  Filled 2017-07-14: qty 3.5

## 2017-07-14 MED ORDER — POLYMYXIN B-TRIMETHOPRIM 10000-0.1 UNIT/ML-% OP SOLN: 2.0000 [drp] | OPHTHALMIC | 0 refills | Status: DC

## 2017-07-14 NOTE — ED Provider Notes (Signed)
[facility] Provider Note   CSN: [id number] Arrival date & time: 07/14/17  2021     History   Chief Complaint Chief Complaint  Patient presents with  . Eye Drainage    HPI Robert Cruz is a 41 y.o. male.  Pt woke up this morning with his right eye stuck shut with crust.  He cleared the crust then noticed his right eye was red.  Eye has drained throughout the day.  Pt has not been exposed to known pink eye.  No trauma to the eye.  No contacts.  No blurry vision.      Past Medical History:  Diagnosis Date  . Chicken pox   . GERD (gastroesophageal reflux disease)   . Hypertension   . Morbid obesity (HCC)   . Sleep apnea     Patient Active Problem List   Diagnosis Date Noted  . Allergic rhinitis 10/11/2016  . Sore throat 10/11/2016  . Chronic migraine without aura without status migrainosus, not intractable 01/14/2016  . 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 Generalized headache 12/16/2015  . Encounter for preventive health examination 09/06/2013  . GERD (gastroesophageal reflux disease) 09/06/2013  . DYSPHAGIA UNSPECIFIED 02/08/2011  . OSA (obstructive sleep apnea) 12/28/2008  . Severe obesity (BMI >= 40) (HCC) 10/07/2008  . Essential hypertension 08/07/2007    History reviewed. No pertinent surgical history.     Home Medications    Prior to Admission medications   Medication Sig Start Date End Date Taking? Authorizing Provider  atenolol (TENORMIN) 100 MG tablet Take 1 tablet (100 mg total) by mouth daily. 05/08/17   Drema Dallas, DO  hydrochlorothiazide (HYDRODIURIL) 25 MG tablet Take 1 tablet (25 mg total) by mouth daily. 12/16/16   Nahser, Deloris Ping, MD  lisinopril (PRINIVIL,ZESTRIL) 20 MG tablet Take 1 tablet (20 mg total) by mouth daily. 12/16/16 03/16/17  Nahser, Deloris Ping, MD  oxyCODONE-acetaminophen (PERCOCET) 5-325 MG tablet Take 1 tablet by mouth every 6 (six) hours as needed (for pain). 04/13/17   Molpus, John, MD  spironolactone  (ALDACTONE) 100 MG tablet Take 1 and a half tablets (150 mg) once daily 05/05/17   Veryl Speak, FNP  terbinafine (LAMISIL) 250 MG tablet  12/24/16   [provider]  trimethoprim-polymyxin b (POLYTRIM) ophthalmic solution Place 2 drops into the right eye every 4 (four) hours. 07/14/17   Jacalyn Lefevre, MD    Family History Family History  Problem Relation Age of Onset  . Hypertension Father   . Depression Father   . Hypertension Mother   . Kidney disease Maternal Grandmother        Dialysis  . Hypertension Maternal Grandmother   . Healthy Brother        x1  . Healthy Sister        x3  . Colon cancer Neg Hx   . Esophageal cancer Neg Hx   . Rectal cancer Neg Hx   . Stomach cancer Neg Hx     Social History Social History  Substance Use Topics  . Smoking status: Never Smoker  . Smokeless tobacco: Never Used  . Alcohol use No     Allergies   Amlodipine and Bystolic [nebivolol hcl]   Review of Systems Review of Systems  Eyes: Positive for discharge and redness.  All other systems reviewed and are negative.    Physical Exam Updated Vital Signs BP 127/89 (BP Location: Right Arm)   Pulse 89   Temp 98.3 F (36.8 C) (Oral)  Resp 20   Ht 6\' 1"  (1.854 m)   Wt 131.5 kg (290 lb)   SpO2 98%   BMI 38.26 kg/m   Physical Exam  Constitutional: He is oriented to person, place, and time. He appears well-developed and well-nourished.  HENT:  Head: Normocephalic and atraumatic.  Right Ear: External ear normal.  Left Ear: External ear normal.  Nose: Nose normal.  Mouth/Throat: Oropharynx is clear and moist.  Eyes: Pupils are equal, round, and reactive to light. EOM are normal. Right eye exhibits discharge. Right conjunctiva is injected.  Neck: Normal range of motion. Neck supple.  Cardiovascular: Normal rate, regular rhythm, normal heart sounds and intact distal pulses.   Pulmonary/Chest: Effort normal and breath sounds normal.  Neurological: He is alert and  oriented to person, place, and time.  Nursing note and vitals reviewed.    ED Treatments / Results  Labs (all labs ordered are listed, but only abnormal results are displayed) Labs Reviewed - No data to display  EKG  EKG Interpretation None       Radiology No results found.  Procedures Procedures (including critical care time)  Medications Ordered in ED Medications  erythromycin ophthalmic ointment (not administered)     Initial Impression / Assessment and Plan / ED Course  I have reviewed the triage vital signs and the nursing notes.  Pertinent labs & imaging results that were available during my care of the patient were reviewed by me and considered in my medical decision making (see chart for details).     Return if worse.  F/u with ophthalmology.  Final Clinical Impressions(s) / ED Diagnoses   Final diagnoses:  Acute bacterial conjunctivitis of right eye    New Prescriptions New Prescriptions   TRIMETHOPRIM-POLYMYXIN B (POLYTRIM) OPHTHALMIC SOLUTION    Place 2 drops into the right eye every 4 (four) hours.     Jacalyn LefevreHaviland, Tameron Lama, MD 07/14/17 2203

## 2017-08-04 ENCOUNTER — Other Ambulatory Visit: Payer: Self-pay | Admitting: *Deleted

## 2017-08-04 ENCOUNTER — Telehealth: Payer: Self-pay | Admitting: Cardiovascular Disease

## 2017-08-04 MED ORDER — SPIRONOLACTONE 100 MG PO TABS: ORAL_TABLET | ORAL | 3 refills | Status: DC

## 2017-08-04 NOTE — Telephone Encounter (Signed)
New message      *STAT* If patient is at the pharmacy, call can be transferred to refill team.   1. Which medications need to be refilled? (please list name of each medication and dose if known)  spironolactone (ALDACTONE) 100 MG tablet Take 1 and a half tablets (150 mg) once daily     2. Which pharmacy/location (including street and city if local pharmacy) is medication to be sent to? [facility] [address]   3. Do they need a 30 day or 90 day supply? 90

## 2017-08-30 ENCOUNTER — Encounter: Payer: Self-pay | Admitting: Cardiovascular Disease

## 2017-09-08 ENCOUNTER — Encounter: Payer: Self-pay | Admitting: Cardiovascular Disease

## 2017-09-08 ENCOUNTER — Ambulatory Visit (INDEPENDENT_AMBULATORY_CARE_PROVIDER_SITE_OTHER): Payer: Managed Care, Other (non HMO) | Admitting: Cardiovascular Disease

## 2017-09-08 ENCOUNTER — Other Ambulatory Visit: Payer: Self-pay | Admitting: Nurse Practitioner

## 2017-09-08 VITALS — BP 130/82 | HR 65 | Ht 73.0 in | Wt 334.0 lb

## 2017-09-08 DIAGNOSIS — E782 Mixed hyperlipidemia: Secondary | ICD-10-CM | POA: Diagnosis not present

## 2017-09-08 DIAGNOSIS — I10 Essential (primary) hypertension: Secondary | ICD-10-CM | POA: Diagnosis not present

## 2017-09-08 LAB — BASIC METABOLIC PANEL
BUN/Creatinine Ratio: 11 (ref 9–20)
BUN: 18 mg/dL (ref 6–24)
CALCIUM: 9.7 mg/dL (ref 8.7–10.2)
CO2: 19 mmol/L — AB (ref 20–29)
CREATININE: 1.59 mg/dL — AB (ref 0.76–1.27)
Chloride: 99 mmol/L (ref 96–106)
GFR calc Af Amer: 61 mL/min/{1.73_m2} (ref >59)
GFR calc non Af Amer: 53 mL/min/{1.73_m2} — ABNORMAL LOW (ref >59)
Glucose: 110 mg/dL — ABNORMAL HIGH (ref 65–99)
POTASSIUM: 5.4 mmol/L — AB (ref 3.5–5.2)
Sodium: 135 mmol/L (ref 134–144)

## 2017-09-08 LAB — HEPATIC FUNCTION PANEL
ALBUMIN: 4.3 g/dL (ref 3.5–5.5)
ALT: 12 IU/L (ref 0–44)
AST: 16 IU/L (ref 0–40)
Alkaline Phosphatase: 75 IU/L (ref 39–117)
Bilirubin Total: 0.3 mg/dL (ref 0.0–1.2)
Bilirubin, Direct: 0.07 mg/dL (ref 0.00–0.40)
TOTAL PROTEIN: 7.6 g/dL (ref 6.0–8.5)

## 2017-09-08 LAB — LIPID PANEL
CHOL/HDL RATIO: 4.9 ratio (ref 0.0–5.0)
Cholesterol, Total: 167 mg/dL (ref 100–199)
HDL: 34 mg/dL — ABNORMAL LOW (ref >39)
LDL CALC: 110 mg/dL — AB (ref 0–99)
Triglycerides: 116 mg/dL (ref 0–149)
VLDL CHOLESTEROL CAL: 23 mg/dL (ref 5–40)

## 2017-09-08 NOTE — Progress Notes (Signed)
Cardiology Office Note   Date:  09/08/2017   ID:  Robert Cruz, DOB Mar 14, 1976, MRN [id number]  PCP:  Veryl Speak, FNP  Cardiologist:   Kristeen Miss, MD   Chief Complaint  Patient presents with  . Hypertension   Problem List 1. Essential HTN 2. Obstructive sleep apnea 3, GERD    Previous notes:  Robert Cruz is a 41 y.o. male who presents for evaluation of his HTN He has been on multiple meds with minimal response  Very busy at work .    Not a lot of time to exercise  Has been on a better diet for the past 1 year  or so.  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  No DOE doing fairly moderate exercise ( ie no dyspnea walking up the stadium steps at the [employer] a month ago )  Has not lost any weight on his new diet.   Typically break fast - eggs, [place] sausage /turkey bacon  Lunch - subway - 6 inch or salad   - used to eat lots of fried foods, [employer], chick fil a  Dinner - spag. Occasionally take out from [employer]   Apr 05, 2016:  Robert Cruz is seen back today for follow up of his HTN Echo shows normal LV function with moderate LAE  Has tried clonidine -  Was not effective so it was stopped  Hydralazine was substituted.  BP is still high  Has tried Bystolic - caused a severe headache Still drinking lots of sweet tea Lots of juices. Still eats out quite a bit .  Weight is 343 today .  I advised him to lose 5-10 lbs before his next visit .   May 09, 2016  weight is 341 today  BP is elevated.  Has OSA - has been using CPAP , may need some adjustment on the mask gasket  Has been working lots - worked 24 straight days  Avoiding salt    Oct. 18, 2017:  Robert Cruz is seen back for office visit for HTN Has done well on the Aldactone Feeling better.  Headaches have resolved,   Leg edema has resolved.   09/08/2017 Works now for [employer].    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 Still drinking sweet tea.   Wt = 334 ( down 6 lbs from last year)  Having some abd. Pain after eating  Symptoms sound like esophageal stricture /   hiatal hernia / or  GERD  Has had esophageal dilitation in the past.  Has cut out greasy foods.   Does eat some bacon on occasion.    Past Medical History:  Diagnosis Date  . Chicken pox   . GERD (gastroesophageal reflux disease)   . Hypertension   . Morbid obesity (HCC)   . Sleep apnea     No past surgical history on file.   Current Outpatient Prescriptions  Medication Sig Dispense Refill  . atenolol (TENORMIN) 100 MG tablet Take 1 tablet (100 mg total) by mouth daily. 90 tablet 2  . hydrochlorothiazide (HYDRODIURIL) 25 MG tablet Take 1 tablet (25 mg total) by mouth daily. 90 tablet 3  . lisinopril (PRINIVIL,ZESTRIL) 20 MG tablet Take 1 tablet (20 mg total) by mouth daily. 90 tablet 3  .  spironolactone (ALDACTONE) 100 MG tablet Take 1 and a half tablets (150 mg) once daily 45 tablet 3   No current facility-administered medications for this visit.     Allergies:   Amlodipine and Bystolic [nebivolol hcl]    Social History:  The patient  reports that he has never smoked. He has never used smokeless tobacco. He reports that he does not drink alcohol or use drugs.   Family History:  The patient's family history includes Depression in his father; Healthy in his brother and sister; Hypertension in his father, maternal grandmother, and mother; Kidney disease in his maternal grandmother.    ROS:  Please see the history of present illness.    Review of Systems: Constitutional:  denies fever, chills, diaphoresis, appetite change and fatigue.  HEENT: denies photophobia, eye pain, redness, hearing loss, ear pain, congestion, sore throat, rhinorrhea, sneezing, neck pain, neck stiffness and tinnitus.  Respiratory: denies SOB, DOE, cough, chest tightness,  and wheezing.  Cardiovascular: denies chest pain, palpitations and leg swelling.  Gastrointestinal: denies nausea, vomiting, abdominal pain, diarrhea, constipation, blood in stool.  Genitourinary: denies dysuria, urgency, frequency, hematuria, flank pain and difficulty urinating.  Musculoskeletal: denies  myalgias, back pain, joint swelling, arthralgias and gait problem.   Skin: denies pallor, rash and wound.  Neurological: denies dizziness, seizures, syncope, weakness, light-headedness, numbness and headaches.   Hematological: denies adenopathy, easy bruising, personal or family bleeding history.  Psychiatric/ Behavioral: denies suicidal ideation, mood changes, confusion, nervousness, sleep disturbance and agitation.       All other systems are reviewed and negative.   Physical Exam: Blood pressure 130/82, pulse 65, height  (1.854 m), weight (!) 334 lb (151.5 kg), SpO2 98 %.  GEN:  Well nourished, well developed in no acute distress HEENT: Normal NECK: No JVD; No carotid bruits LYMPHATICS: No lymphadenopathy CARDIAC: RR, no murmurs, rubs, gallops RESPIRATORY:  Clear to auscultation without rales, wheezing or rhonchi  ABDOMEN: Soft, non-tender, non-distended. Morbidly obese  MUSCULOSKELETAL:  No edema; No deformity  SKIN: Warm and dry NEUROLOGIC:  Alert and oriented x 3  EKG:  EKG is ordered today. Oct. 5, 2018:  NSR at 62.  Sinus arrhythmia  Recent Labs: No results found for requested labs within last 8760 hours.    Lipid Panel    Component Value Date/Time   CHOL 168 10/07/2013 0817   TRIG 72 10/07/2013 0817   HDL 38 (L) 10/07/2013 0817   CHOLHDL 4.4 10/07/2013 0817   VLDL 14 10/07/2013 0817   LDLCALC 116 (H) 10/07/2013 0817      Wt Readings from Last 3 Encounters:  09/08/17 (!) 334 lb (151.5 kg)  07/14/17 290 lb (131.5 kg)  04/13/17 300 lb (136.1 kg)      Other studies Reviewed: Additional studies/ records that were reviewed today include: . Review of  the above records demonstrates:    ASSESSMENT AND PLAN:  1.  Essential HTN-  . His blood pressure looks good today. Continue current medications  2. Obesity :  He's lost 6 pounds since last office visit. I have encouraged him to work on continued weight loss.    2. Leg edema  Leg edema has now resolved. He is avoiding lots of salty foods.   3. Hyperlipidemia:  Mildly elevated.  Will check labs today  4. Obstructive sleep apnea     Current medicines are reviewed at length with the patient today.  The patient does not have concerns regarding medicines.  The following changes have been  made:  We'll discontinue metoprolol and try carvedilol 25 mg twice a day.  Labs/ tests ordered today include:   No orders of the defined types were placed in this encounter.   Disposition:       Kristeen Miss, MD  09/08/2017 8:48 AM    [facility] [address] [place], [place], [place]  [address] Phone: [phone]; Fax: [phone]

## 2017-09-08 NOTE — Patient Instructions (Signed)

## 2017-09-21 ENCOUNTER — Encounter (HOSPITAL_COMMUNITY): Payer: Self-pay | Admitting: Emergency Medicine

## 2017-09-21 ENCOUNTER — Ambulatory Visit (HOSPITAL_COMMUNITY)
Admission: EM | Admit: 2017-09-21 | Discharge: 2017-09-21 | Disposition: A | Discharge status: Home / Self Care | Payer: Managed Care, Other (non HMO) | Attending: Emergency Medicine | Admitting: Emergency Medicine

## 2017-09-21 DIAGNOSIS — B349 Viral infection, unspecified: Secondary | ICD-10-CM | POA: Diagnosis not present

## 2017-09-21 NOTE — Discharge Instructions (Signed)
Your symptoms are more consistent with a type of virus. There are several times a virus that occurred during the cold and flu season. Does not appear that you have fluid. Rest, drink plenty of fluids and stay well-hydrated. Recommend Tylenol every 4 hours. Reduce the amount of ibuprofen you are taking 400 mg every 6-8 hours and for only one or 2 days.for worsening or new symptoms or problems may return.

## 2017-09-21 NOTE — ED Provider Notes (Signed)
[facility]    CSN: [id number] Arrival date & time: 09/21/17  1001     History   Chief Complaint Chief Complaint  Patient presents with  . Headache    HPI Robert Cruz is a 41 y.o. male.   41 year old male states yesterday he developed headache, fatigue, malaise, tiredness, chills and sweats. He felt that he may have had a fever due to the above symptoms but not measured. He has been taking ibuprofen and feels better. Chills, feels less fatigued. He states Appropriate as helped. He has to be careful in regards to the amount of ibuprofen secondary to renal function test abnormalities, nonspecified. He has a history of hypertension and morbid obesity. Denies upper respiratory symptoms. He does have sleep apnea, did not use CPAP last night and a lot of snoring, over this morning and noticed that his uvula was swollen. He states later this morning he noticed that the uvula was back to normal size. Denies sore throat. He works as a Hospital doctor and delivery person.      Past Medical History:  Diagnosis Date  . Chicken pox   . GERD (gastroesophageal reflux disease)   . Hypertension   . Morbid obesity (HCC)   . Sleep apnea     Patient Active Problem List   Diagnosis Date Noted  . Mixed hyperlipidemia 09/08/2017  . Allergic rhinitis 10/11/2016  . Sore throat 10/11/2016  . Chronic migraine without aura without status migrainosus, not intractable 01/14/2016  . 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 Generalized headache 12/16/2015  . Encounter for preventive health examination 09/06/2013  . GERD (gastroesophageal reflux disease) 09/06/2013  . DYSPHAGIA UNSPECIFIED 02/08/2011  . OSA (obstructive sleep apnea) 12/28/2008  . Severe obesity (BMI >= 40) (HCC) 10/07/2008  . Essential hypertension 08/07/2007    History reviewed. No pertinent surgical history.     Home Medications    Prior to Admission medications   Medication Sig Start Date End Date Taking? Authorizing  Provider  atenolol (TENORMIN) 100 MG tablet Take 1 tablet (100 mg total) by mouth daily. 05/08/17   Drema Dallas, DO  hydrochlorothiazide (HYDRODIURIL) 25 MG tablet Take 1 tablet (25 mg total) by mouth daily. 12/16/16   Nahser, Deloris Ping, MD  lisinopril (PRINIVIL,ZESTRIL) 20 MG tablet Take 1 tablet (20 mg total) by mouth daily. 12/16/16 03/16/17  Nahser, Deloris Ping, MD  spironolactone (ALDACTONE) 100 MG tablet Take 1 and a half tablets (150 mg) once daily 08/04/17   Nahser, Deloris Ping, MD    Family History Family History  Problem Relation Age of Onset  . Hypertension Father   . Depression Father   . Hypertension Mother   . Kidney disease Maternal Grandmother        Dialysis  . Hypertension Maternal Grandmother   . Healthy Brother        x1  . Healthy Sister        x3  . Colon cancer Neg Hx   . Esophageal cancer Neg Hx   . Rectal cancer Neg Hx   . Stomach cancer Neg Hx     Social History Social History  Substance Use Topics  . Smoking status: Never Smoker  . Smokeless tobacco: Never Used  . Alcohol use No     Allergies   Amlodipine and Bystolic [nebivolol hcl]   Review of Systems Review of Systems  Constitutional: Positive for activity change, chills and fatigue. Negative for diaphoresis.  HENT: Negative for congestion, ear pain, facial swelling, postnasal drip,  rhinorrhea, sinus pain, sore throat and trouble swallowing.   Eyes: Negative for pain, discharge and redness.  Respiratory: Negative for cough, chest tightness and shortness of breath.   Cardiovascular: Negative.   Gastrointestinal: Negative.   Musculoskeletal: Negative.  Negative for neck pain and neck stiffness.  Neurological: Positive for headaches.     Physical Exam Triage Vital Signs ED Triage Vitals  Enc Vitals Group     BP      Pulse      Resp      Temp      Temp src      SpO2      Weight      Height      Head Circumference      Peak Flow      Pain Score      Pain Loc      Pain Edu?      Excl.  in GC?    No data found.   Updated Vital Signs BP 138/73 (BP Location: Left Arm)   Pulse 78   Temp 98.3 F (36.8 C) (Oral)   Resp 16   SpO2 100%   Visual Acuity Right Eye Distance:   Left Eye Distance:   Bilateral Distance:    Right Eye Near:   Left Eye Near:    Bilateral Near:     Physical Exam  Constitutional: He is oriented to person, place, and time. He appears well-developed and well-nourished. No distress.  HENT:  Bilateral TMs are normal. Oropharynx with minor erythema. Uvula is of normal size. Minor erythema of the palatine arch but no swelling. Airway widely patent. No exudates. Moderate amount of clear PND.  Eyes: EOM are normal.  Neck: Normal range of motion. Neck supple.  Cardiovascular: Normal rate, regular rhythm, normal heart sounds and intact distal pulses.   Pulmonary/Chest: Effort normal and breath sounds normal. No respiratory distress. He has no wheezes. He has no rales.  Musculoskeletal: Normal range of motion. He exhibits no edema.  Lymphadenopathy:    He has no cervical adenopathy.  Neurological: He is alert and oriented to person, place, and time.  Skin: Skin is warm and dry. No rash noted.  Psychiatric: He has a normal mood and affect.  Nursing note and vitals reviewed.    UC Treatments / Results  Labs (all labs ordered are listed, but only abnormal results are displayed) Labs Reviewed - No data to display  EKG  EKG Interpretation None       Radiology No results found.  Procedures Procedures (including critical care time)  Medications Ordered in UC Medications - No data to display   Initial Impression / Assessment and Plan / UC Course  I have reviewed the triage vital signs and the nursing notes.  Pertinent labs & imaging results that were available during my care of the patient were reviewed by me and considered in my medical decision making (see chart for details).     Your symptoms are more consistent with a type of  virus. There are several times a virus that occurred during the cold and flu season. Does not appear that you have fluid. Rest, drink plenty of fluids and stay well-hydrated. Recommend Tylenol every 4 hours. Reduce the amount of ibuprofen you are taking 400 mg every 6-8 hours and for only one or 2 days.for worsening or new symptoms or problems may return.   Final Clinical Impressions(s) / UC Diagnoses   Final diagnoses:  Viral illness  New Prescriptions New Prescriptions   No medications on file     Controlled Substance Prescriptions [place] Controlled Substance Registry consulted? Not Applicable   Hayden RasmussenMabe, Jevaun, NP 09/21/17 1051

## 2017-09-21 NOTE — ED Triage Notes (Signed)
Pt c/o chills, weakness, headache since yesterday.

## 2017-09-27 ENCOUNTER — Ambulatory Visit (INDEPENDENT_AMBULATORY_CARE_PROVIDER_SITE_OTHER): Payer: Managed Care, Other (non HMO)

## 2017-09-27 DIAGNOSIS — Z23 Encounter for immunization: Secondary | ICD-10-CM

## 2017-10-10 ENCOUNTER — Other Ambulatory Visit: Payer: Self-pay

## 2017-10-10 ENCOUNTER — Emergency Department (HOSPITAL_BASED_OUTPATIENT_CLINIC_OR_DEPARTMENT_OTHER): Payer: Managed Care, Other (non HMO)

## 2017-10-10 ENCOUNTER — Encounter (HOSPITAL_BASED_OUTPATIENT_CLINIC_OR_DEPARTMENT_OTHER): Payer: Self-pay | Admitting: *Deleted

## 2017-10-10 ENCOUNTER — Other Ambulatory Visit: Payer: Managed Care, Other (non HMO) | Admitting: *Deleted

## 2017-10-10 DIAGNOSIS — I10 Essential (primary) hypertension: Secondary | ICD-10-CM | POA: Insufficient documentation

## 2017-10-10 DIAGNOSIS — Z79899 Other long term (current) drug therapy: Secondary | ICD-10-CM | POA: Diagnosis not present

## 2017-10-10 DIAGNOSIS — R072 Precordial pain: Secondary | ICD-10-CM | POA: Insufficient documentation

## 2017-10-10 DIAGNOSIS — R079 Chest pain, unspecified: Secondary | ICD-10-CM | POA: Diagnosis present

## 2017-10-10 LAB — BASIC METABOLIC PANEL
ANION GAP: 7 (ref 5–15)
BUN/Creatinine Ratio: 12 (ref 9–20)
BUN: 17 mg/dL (ref 6–24)
BUN: 20 mg/dL (ref 6–20)
CALCIUM: 9.2 mg/dL (ref 8.7–10.2)
CALCIUM: 9 mg/dL (ref 8.9–10.3)
CO2: 20 mmol/L (ref 20–29)
CO2: 23 mmol/L (ref 22–32)
Chloride: 102 mmol/L (ref 96–106)
Chloride: 106 mmol/L (ref 101–111)
Creatinine, Ser: 1.46 mg/dL — ABNORMAL HIGH (ref 0.76–1.27)
Creatinine, Ser: 1.58 mg/dL — ABNORMAL HIGH (ref 0.61–1.24)
GFR calc Af Amer: 68 mL/min/{1.73_m2} (ref >59)
GFR, EST NON AFRICAN AMERICAN: 59 mL/min/{1.73_m2} — AB (ref >59)
GFR, EST NON AFRICAN AMERICAN: 53 mL/min — AB (ref >60)
Glucose, Bld: 123 mg/dL — ABNORMAL HIGH (ref 65–99)
Glucose: 99 mg/dL (ref 65–99)
POTASSIUM: 4.8 mmol/L (ref 3.5–5.2)
Potassium: 4.1 mmol/L (ref 3.5–5.1)
Sodium: 138 mmol/L (ref 134–144)
Sodium: 136 mmol/L (ref 135–145)

## 2017-10-10 LAB — CBC
HCT: 33.4 % — ABNORMAL LOW (ref 39.0–52.0)
HEMOGLOBIN: 11 g/dL — AB (ref 13.0–17.0)
MCH: 27.8 pg (ref 26.0–34.0)
MCHC: 32.9 g/dL (ref 30.0–36.0)
MCV: 84.3 fL (ref 78.0–100.0)
Platelets: 336 10*3/uL (ref 150–400)
RBC: 3.96 MIL/uL — AB (ref 4.22–5.81)
RDW: 14 % (ref 11.5–15.5)
WBC: 10.2 10*3/uL (ref 4.0–10.5)

## 2017-10-10 LAB — TROPONIN I

## 2017-10-10 NOTE — ED Triage Notes (Signed)
Chest pain all day. Feels like something is sitting on his chest. He saw a cardiologist a month ago for follow visit but was not having chest pain at the time of the visit. He has been seeing the cardiologist for a year due to HTN.

## 2017-10-11 ENCOUNTER — Encounter (HOSPITAL_BASED_OUTPATIENT_CLINIC_OR_DEPARTMENT_OTHER): Payer: Self-pay | Admitting: Emergency Medicine

## 2017-10-11 ENCOUNTER — Emergency Department (HOSPITAL_BASED_OUTPATIENT_CLINIC_OR_DEPARTMENT_OTHER)
Admission: EM | Admit: 2017-10-11 | Discharge: 2017-10-11 | Disposition: A | Discharge status: Home / Self Care | Payer: Managed Care, Other (non HMO) | Attending: Emergency Medicine | Admitting: Emergency Medicine

## 2017-10-11 DIAGNOSIS — R072 Precordial pain: Secondary | ICD-10-CM

## 2017-10-11 LAB — TROPONIN I

## 2017-10-11 MED ORDER — GI COCKTAIL ~~LOC~~
30.0000 mL | Freq: Once | ORAL | Status: AC
  Administered 2017-10-11: 30 mL via ORAL
  Filled 2017-10-11: qty 30

## 2017-10-11 MED ORDER — IBUPROFEN 800 MG PO TABS
800.0000 mg | ORAL_TABLET | Freq: Once | ORAL | Status: AC
  Administered 2017-10-11: 800 mg via ORAL
  Filled 2017-10-11: qty 1

## 2017-10-11 MED ORDER — ACETAMINOPHEN 500 MG PO TABS
1000.0000 mg | ORAL_TABLET | Freq: Once | ORAL | Status: AC
  Administered 2017-10-11: 1000 mg via ORAL
  Filled 2017-10-11: qty 2

## 2017-10-11 NOTE — ED Notes (Signed)
Pt verbalizes understanding of d/c instructions and denies any further needs at this time. 

## 2017-10-11 NOTE — ED Notes (Signed)
Pt. Falls asleep during assessment off and on.

## 2017-10-11 NOTE — ED Provider Notes (Addendum)
[facility] EMERGENCY DEPARTMENT Provider Note   CSN: [id number] Arrival date & time: 10/10/17  2225     History   Chief Complaint Chief Complaint  Patient presents with  . Chest Pain    HPI Robert Cruz is a 41 y.o. male.  The history is provided by the patient.  Chest Pain   This is a new problem. The current episode started yesterday. The problem occurs constantly. The problem has not changed since onset.Associated with: movement of arms. The pain is present in the substernal region. The quality of the pain is described as dull. The pain does not radiate. Pertinent negatives include no abdominal pain, no diaphoresis, no exertional chest pressure, no irregular heartbeat, no leg pain, no lower extremity edema, no palpitations, no shortness of breath, no sputum production and no syncope. He has tried nothing for the symptoms. The treatment provided no relief. Risk factors include male gender and obesity.  Pertinent negatives for past medical history include no aneurysm and no Marfan's syndrome.  Pertinent negatives for family medical history include: no Marfan's syndrome.  Procedure history is negative for cardiac catheterization.    Past Medical History:  Diagnosis Date  . Chicken pox   . GERD (gastroesophageal reflux disease)   . Hypertension   . Morbid obesity (HCC)   . Sleep apnea     Patient Active Problem List   Diagnosis Date Noted  . Mixed hyperlipidemia 09/08/2017  . Allergic rhinitis 10/11/2016  . Sore throat 10/11/2016  . Chronic migraine without aura without status migrainosus, not intractable 01/14/2016  . 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 Generalized headache 12/16/2015  . Encounter for preventive health examination 09/06/2013  . GERD (gastroesophageal reflux disease) 09/06/2013  . DYSPHAGIA UNSPECIFIED 02/08/2011  . OSA (obstructive sleep apnea) 12/28/2008  . Severe obesity (BMI >= 40) (HCC) 10/07/2008  . Essential hypertension  08/07/2007    History reviewed. No pertinent surgical history.     Home Medications    Prior to Admission medications   Medication Sig Start Date End Date Taking? Authorizing Provider  atenolol (TENORMIN) 100 MG tablet Take 1 tablet (100 mg total) by mouth daily. 05/08/17  Yes Jaffe, Adam R, DO  hydrochlorothiazide (HYDRODIURIL) 25 MG tablet Take 1 tablet (25 mg total) by mouth daily. 12/16/16  Yes Nahser, Deloris Ping, MD  lisinopril (PRINIVIL,ZESTRIL) 20 MG tablet Take 1 tablet (20 mg total) by mouth daily. 12/16/16 10/10/17 Yes Nahser, Deloris Ping, MD  spironolactone (ALDACTONE) 100 MG tablet Take 1 and a half tablets (150 mg) once daily 08/04/17  Yes Nahser, Deloris Ping, MD    Family History Family History  Problem Relation Age of Onset  . Hypertension Father   . Depression Father   . Hypertension Mother   . Kidney disease Maternal Grandmother        Dialysis  . Hypertension Maternal Grandmother   . Healthy Brother        x1  . Healthy Sister        x3  . Colon cancer Neg Hx   . Esophageal cancer Neg Hx   . Rectal cancer Neg Hx   . Stomach cancer Neg Hx     Social History Social History   Tobacco Use  . Smoking status: Never Smoker  . Smokeless tobacco: Never Used  Substance Use Topics  . Alcohol use: No    Alcohol/week: 0.0 oz  . Drug use: No     Allergies   Amlodipine and Bystolic [nebivolol hcl]  Review of Systems Review of Systems  Constitutional: Negative for diaphoresis.  Respiratory: Negative for sputum production, chest tightness and shortness of breath.   Cardiovascular: Positive for chest pain. Negative for palpitations, leg swelling and syncope.  Gastrointestinal: Negative for abdominal pain.  All other systems reviewed and are negative.    Physical Exam Updated Vital Signs BP 139/89   Pulse 63   Temp 98.1 F (36.7 C) (Oral)   Resp 16   Ht 6\' 1"  (1.854 m)   Wt 131.5 kg (290 lb)   SpO2 98%   BMI 38.26 kg/m   Physical Exam  Constitutional:  He is oriented to person, place, and time. He appears well-developed and well-nourished.  Non-toxic appearance. He does not appear ill.  HENT:  Head: Normocephalic and atraumatic.  Eyes: Pupils are equal, round, and reactive to light.  Neck: Normal range of motion. Neck supple. No JVD present.  Cardiovascular: Normal rate, regular rhythm, intact distal pulses and normal pulses. Exam reveals no gallop, no S3 and no S4.  Pulmonary/Chest: Effort normal and breath sounds normal. He has no decreased breath sounds.  Abdominal: Soft. Bowel sounds are normal. There is no tenderness.  Musculoskeletal: Normal range of motion.       Right lower leg: He exhibits no tenderness and no edema.       Left lower leg: He exhibits no tenderness and no edema.  Neurological: He is alert and oriented to person, place, and time.  Skin: Skin is warm and dry. Capillary refill takes less than 2 seconds.  Psychiatric: He has a normal mood and affect. His behavior is normal.     ED Treatments / Results  Labs (all labs ordered are listed, but only abnormal results are displayed)  Results for orders placed or performed during the hospital encounter of 10/11/17  Basic metabolic panel  Result Value Ref Range   Sodium 136 135 - 145 mmol/L   Potassium 4.1 3.5 - 5.1 mmol/L   Chloride 106 101 - 111 mmol/L   CO2 23 22 - 32 mmol/L   Glucose, Bld 123 (H) 65 - 99 mg/dL   BUN 20 6 - 20 mg/dL   Creatinine, Ser 1.611.58 (H) 0.61 - 1.24 mg/dL   Calcium 9.0 8.9 - 09.610.3 mg/dL   GFR calc non Af Amer 53 (L) >60 mL/min   GFR calc Af Amer >60 >60 mL/min   Anion gap 7 5 - 15  CBC  Result Value Ref Range   WBC 10.2 4.0 - 10.5 K/uL   RBC 3.96 (L) 4.22 - 5.81 MIL/uL   Hemoglobin 11.0 (L) 13.0 - 17.0 g/dL   HCT 04.533.4 (L) 40.939.0 - 81.152.0 %   MCV 84.3 78.0 - 100.0 fL   MCH 27.8 26.0 - 34.0 pg   MCHC 32.9 30.0 - 36.0 g/dL   RDW 91.414.0 78.211.5 - 95.615.5 %   Platelets 336 150 - 400 K/uL  Troponin I  Result Value Ref Range   Troponin I <0.03 <0.03  ng/mL   Dg Chest 2 View  Result Date: 10/10/2017 CLINICAL DATA:  Chest pain and tightness since this morning. EXAM: CHEST  2 VIEW COMPARISON:  08/24/2014 FINDINGS: The heart size and mediastinal contours are within normal limits. Both lungs are clear. The visualized skeletal structures are unremarkable. IMPRESSION: No active cardiopulmonary disease. Electronically Signed   By: Tollie Ethavid  Kwon M.D.   On: 10/10/2017 22:59    EKG  EKG Interpretation  Date/Time:  Tuesday October 10 2017 22:29:59 EST  Ventricular Rate:  70 PR Interval:  178 QRS Duration: 94 QT Interval:  402 QTC Calculation: 434 R Axis:   43 Text Interpretation:  Normal sinus rhythm Confirmed by Nicanor AlconPalumbo, Remi Lopata ([id number]) on 10/11/2017 12:21:52 AM       Radiology Dg Chest 2 View  Result Date: 10/10/2017 CLINICAL DATA:  Chest pain and tightness since this morning. EXAM: CHEST  2 VIEW COMPARISON:  08/24/2014 FINDINGS: The heart size and mediastinal contours are within normal limits. Both lungs are clear. The visualized skeletal structures are unremarkable. IMPRESSION: No active cardiopulmonary disease. Electronically Signed   By: Tollie Ethavid  Kwon M.D.   On: 10/10/2017 22:59    Procedures Procedures (including critical care time)  Medications Ordered in ED Medications  gi cocktail (Maalox,Lidocaine,Donnatal) (30 mLs Oral Given 10/11/17 0132)  acetaminophen (TYLENOL) tablet 1,000 mg (1,000 mg Oral Given 10/11/17 0132)  ibuprofen (ADVIL,MOTRIN) tablet 800 mg (800 mg Oral Given 10/11/17 0132)      Final Clinical Impressions(s) / ED Diagnoses   PERC negative wells 0 highly doubt PE.  Heart score is 1 low risk for MACE.  Ruled out in the ED with negative EKG and 2 negative troponins with highly atypical sx.  Return for swelling or the lips or tongue, chest pain, dyspnea on exertion, new weakness or numbness changes in vision or speech,  Inability to tolerate liquids or food, changes in voice cough, altered mental status or any concerns.  No signs of systemic illness or infection. The patient is nontoxic-appearing on exam and vital signs are within normal limits.    I have reviewed the triage vital signs and the nursing notes. Pertinent labs &imaging results that were available during my care of the patient were reviewed by me and considered in my medical decision making (see chart for details).  After history, exam, and medical workup I feel the patient has been appropriately medically screened and is safe for discharge home. Pertinent diagnoses were discussed with the patient. Patient was given return precautions.   Michaela Broski, MD 10/11/17 0140    Cy BlamerPalumbo, Porsche Noguchi, MD 10/11/17 0140

## 2017-12-11 ENCOUNTER — Other Ambulatory Visit: Payer: Self-pay | Admitting: Cardiovascular Disease

## 2017-12-16 ENCOUNTER — Other Ambulatory Visit: Payer: Self-pay | Admitting: Cardiovascular Disease

## 2017-12-18 ENCOUNTER — Other Ambulatory Visit: Payer: Self-pay | Admitting: Cardiovascular Disease

## 2017-12-18 MED ORDER — SPIRONOLACTONE 100 MG PO TABS: ORAL_TABLET | ORAL | 9 refills | Status: DC

## 2018-01-18 ENCOUNTER — Other Ambulatory Visit: Payer: Self-pay | Admitting: Cardiovascular Disease

## 2018-01-19 ENCOUNTER — Telehealth: Payer: Self-pay | Admitting: Cardiovascular Disease

## 2018-01-19 MED ORDER — LISINOPRIL 20 MG PO TABS: 20.0000 mg | ORAL_TABLET | Freq: Every day | ORAL | 2 refills | Status: DC

## 2018-01-19 MED ORDER — HYDROCHLOROTHIAZIDE 25 MG PO TABS: 25.0000 mg | ORAL_TABLET | Freq: Every day | ORAL | 2 refills | Status: DC

## 2018-01-19 NOTE — Telephone Encounter (Signed)
New message    *STAT* If patient is at the pharmacy, call can be transferred to refill team.   1. Which medications need to be refilled? (please list name of each medication and dose if known) lisinopril (PRINIVIL,ZESTRIL) 20 MG tablet and hydrochlorothiazide (HYDRODIURIL) 25 MG tablet  2. Which pharmacy/location (including street and city if local pharmacy) is medication to be sent to? [facility] [address] - [place], [place] - [id number]  3. Do they need a 30 day or 90 day supply? 90

## 2018-01-19 NOTE — Telephone Encounter (Signed)
Pt's medication was sent to pt's pharmacy as requested. Confirmation received.  °

## 2018-04-04 ENCOUNTER — Other Ambulatory Visit: Payer: Self-pay | Admitting: Family

## 2018-04-06 ENCOUNTER — Other Ambulatory Visit: Payer: Self-pay | Admitting: Cardiovascular Disease

## 2018-04-06 MED ORDER — ATENOLOL 100 MG PO TABS: 100.0000 mg | ORAL_TABLET | Freq: Every day | ORAL | 1 refills | Status: DC

## 2018-05-08 ENCOUNTER — Encounter: Payer: Managed Care, Other (non HMO) | Admitting: Nurse Practitioner

## 2018-06-13 IMAGING — DX DG CHEST 2V
2 series · 2 of 2 positions shown · non-contrast
Comparison: 08/24/2014

CLINICAL DATA: Chest pain and tightness since this morning.

EXAM:
CHEST  2 VIEW

[chest pa]
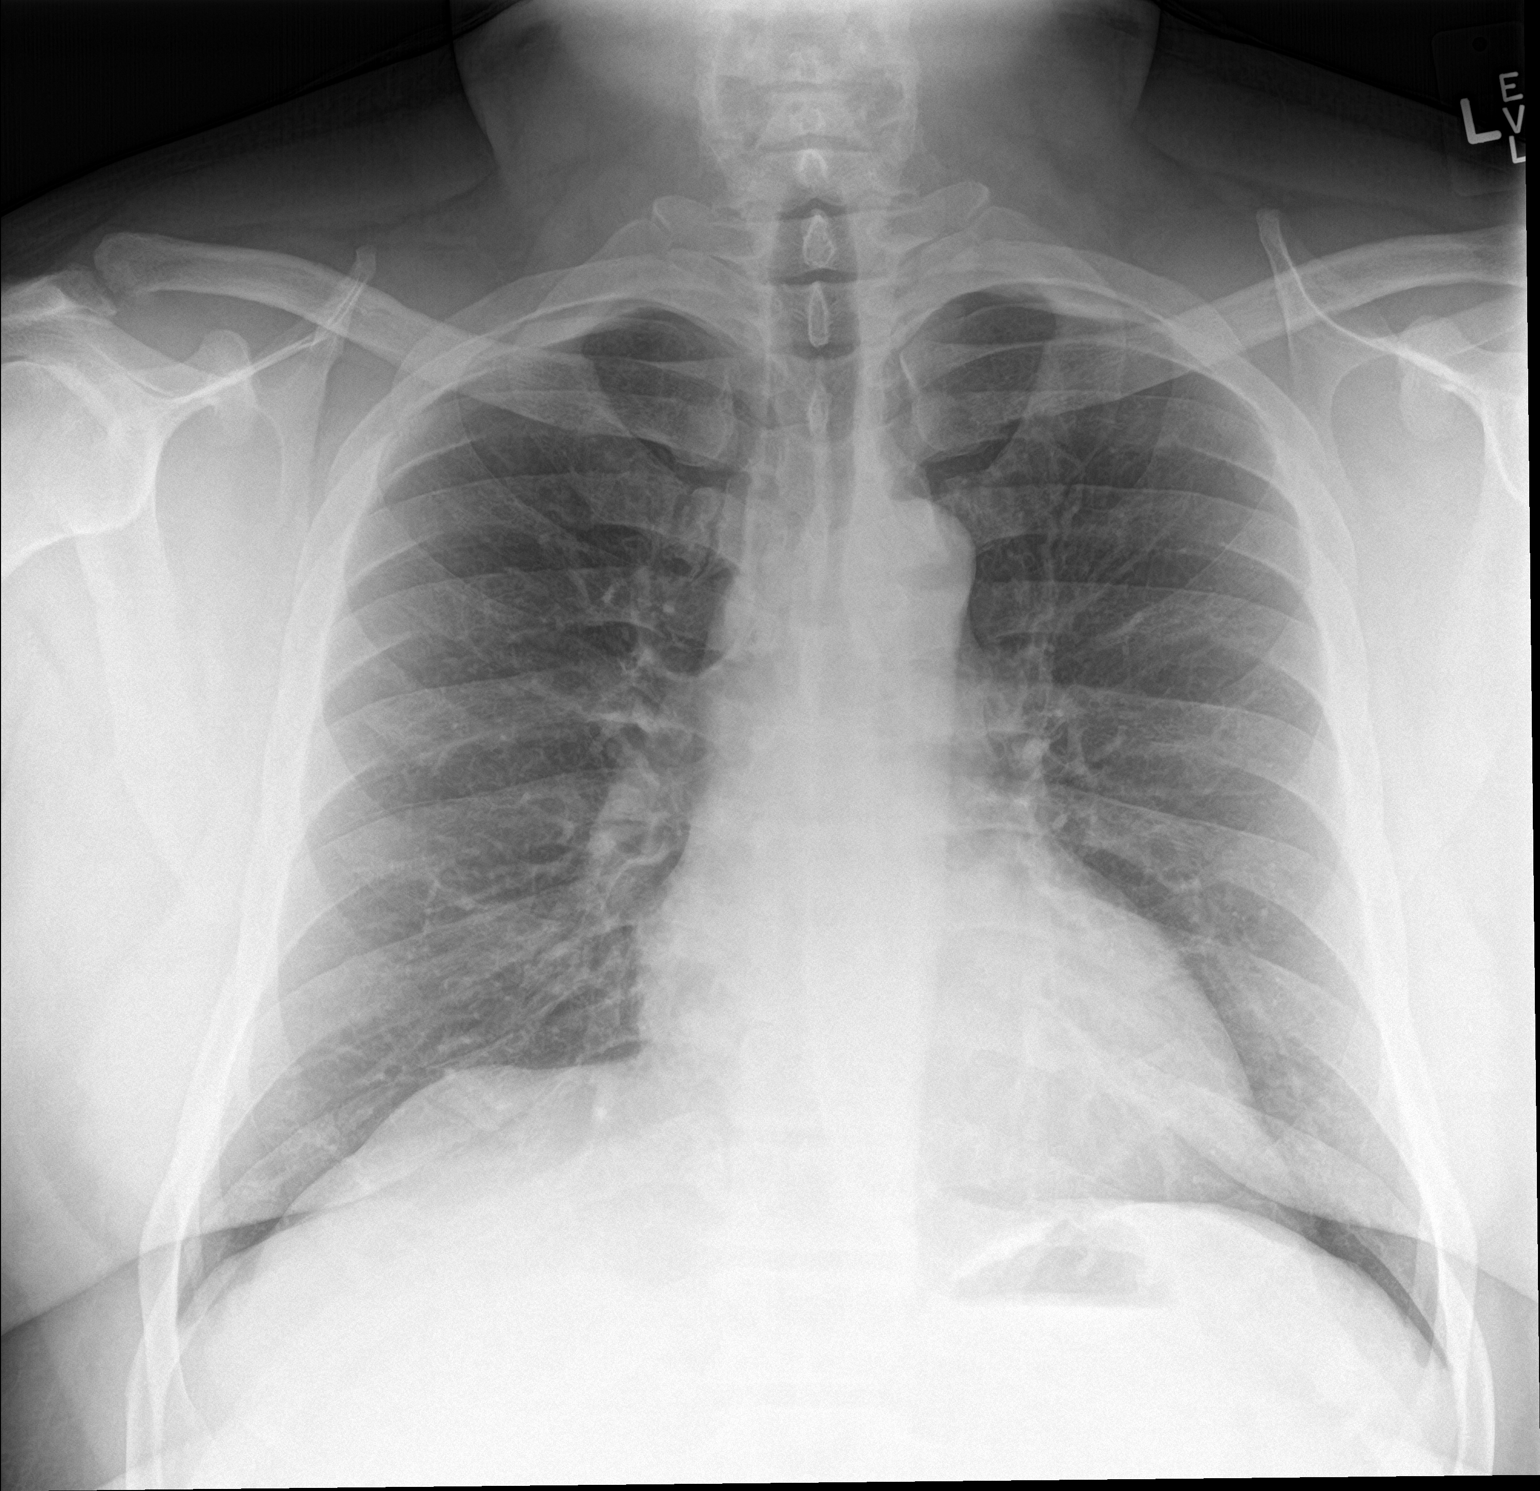

[chest lat]
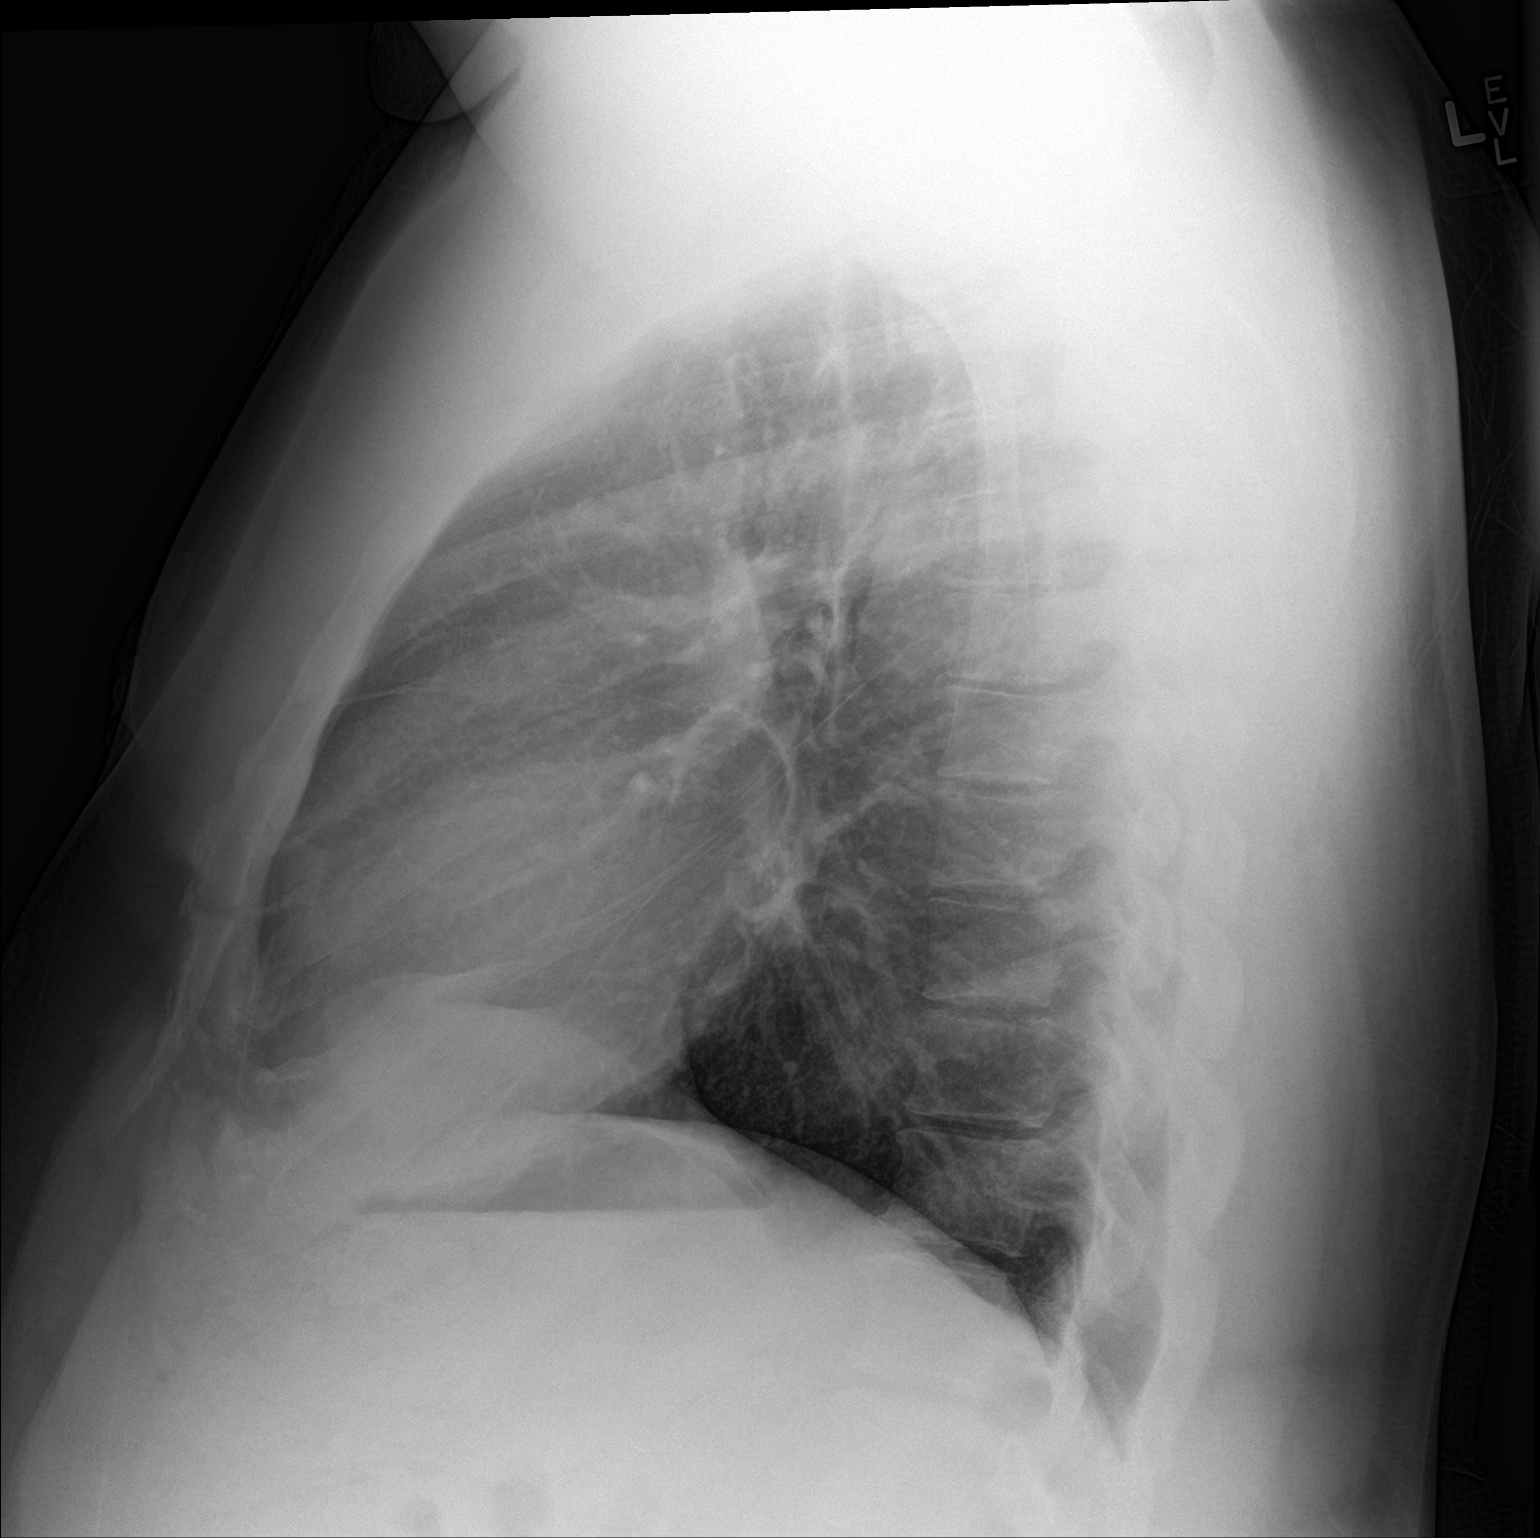

[2 of 2 positions shown; findings below may reference images not displayed]

FINDINGS: The heart size and mediastinal contours are within normal limits.
Both lungs are clear. The visualized skeletal structures are
unremarkable.
IMPRESSION: No active cardiopulmonary disease.

## 2018-06-20 ENCOUNTER — Encounter: Payer: Managed Care, Other (non HMO) | Admitting: Nurse Practitioner

## 2018-07-13 ENCOUNTER — Encounter: Payer: Self-pay | Admitting: Family

## 2018-07-13 ENCOUNTER — Ambulatory Visit (INDEPENDENT_AMBULATORY_CARE_PROVIDER_SITE_OTHER): Payer: Managed Care, Other (non HMO) | Admitting: Family

## 2018-07-13 ENCOUNTER — Other Ambulatory Visit (INDEPENDENT_AMBULATORY_CARE_PROVIDER_SITE_OTHER): Payer: Managed Care, Other (non HMO)

## 2018-07-13 VITALS — BP 138/90 | HR 78 | Temp 97.7°F | Ht 73.0 in | Wt 336.0 lb

## 2018-07-13 DIAGNOSIS — Z13220 Encounter for screening for lipoid disorders: Secondary | ICD-10-CM

## 2018-07-13 DIAGNOSIS — Z125 Encounter for screening for malignant neoplasm of prostate: Secondary | ICD-10-CM | POA: Diagnosis not present

## 2018-07-13 DIAGNOSIS — Z23 Encounter for immunization: Secondary | ICD-10-CM

## 2018-07-13 DIAGNOSIS — E669 Obesity, unspecified: Secondary | ICD-10-CM

## 2018-07-13 DIAGNOSIS — R5383 Other fatigue: Secondary | ICD-10-CM

## 2018-07-13 DIAGNOSIS — Z0000 Encounter for general adult medical examination without abnormal findings: Secondary | ICD-10-CM

## 2018-07-13 LAB — CBC WITH DIFFERENTIAL/PLATELET
BASOS PCT: 0.5 % (ref 0.0–3.0)
Basophils Absolute: 0 10*3/uL (ref 0.0–0.1)
EOS PCT: 5.2 % — AB (ref 0.0–5.0)
Eosinophils Absolute: 0.4 10*3/uL (ref 0.0–0.7)
HCT: 35.5 % — ABNORMAL LOW (ref 39.0–52.0)
Hemoglobin: 11.8 g/dL — ABNORMAL LOW (ref 13.0–17.0)
LYMPHS ABS: 2.4 10*3/uL (ref 0.7–4.0)
Lymphocytes Relative: 31.7 % (ref 12.0–46.0)
MCHC: 33.2 g/dL (ref 30.0–36.0)
MCV: 83.8 fl (ref 78.0–100.0)
MONO ABS: 0.5 10*3/uL (ref 0.1–1.0)
MONOS PCT: 6.5 % (ref 3.0–12.0)
NEUTROS ABS: 4.3 10*3/uL (ref 1.4–7.7)
NEUTROS PCT: 56.1 % (ref 43.0–77.0)
PLATELETS: 337 10*3/uL (ref 150.0–400.0)
RBC: 4.23 Mil/uL (ref 4.22–5.81)
RDW: 15.1 % (ref 11.5–15.5)
WBC: 7.7 10*3/uL (ref 4.0–10.5)

## 2018-07-13 LAB — COMPREHENSIVE METABOLIC PANEL
ALK PHOS: 62 U/L (ref 39–117)
ALT: 14 U/L (ref 0–53)
AST: 16 U/L (ref 0–37)
Albumin: 4.2 g/dL (ref 3.5–5.2)
BUN: 20 mg/dL (ref 6–23)
CHLORIDE: 103 meq/L (ref 96–112)
CO2: 24 meq/L (ref 19–32)
Calcium: 9.7 mg/dL (ref 8.4–10.5)
Creatinine, Ser: 1.54 mg/dL — ABNORMAL HIGH (ref 0.40–1.50)
GFR: 63.87 mL/min (ref >60.00)
GLUCOSE: 119 mg/dL — AB (ref 70–99)
POTASSIUM: 4.7 meq/L (ref 3.5–5.1)
SODIUM: 135 meq/L (ref 135–145)
TOTAL PROTEIN: 7.9 g/dL (ref 6.0–8.3)
Total Bilirubin: 0.4 mg/dL (ref 0.2–1.2)

## 2018-07-13 LAB — LIPID PANEL
CHOL/HDL RATIO: 5
Cholesterol: 160 mg/dL (ref 0–200)
HDL: 31.9 mg/dL — ABNORMAL LOW (ref >39.00)
LDL Cholesterol: 104 mg/dL — ABNORMAL HIGH (ref 0–99)
NonHDL: 128.49
Triglycerides: 123 mg/dL (ref 0.0–149.0)
VLDL: 24.6 mg/dL (ref 0.0–40.0)

## 2018-07-13 LAB — TSH: TSH: 1.13 u[IU]/mL (ref 0.35–4.50)

## 2018-07-13 LAB — PSA: PSA: 0.48 ng/mL (ref 0.10–4.00)

## 2018-07-13 LAB — VITAMIN D 25 HYDROXY (VIT D DEFICIENCY, FRACTURES): VITD: 16.34 ng/mL — ABNORMAL LOW (ref 30.00–100.00)

## 2018-07-13 MED ORDER — PANTOPRAZOLE SODIUM 40 MG PO TBEC: 40.0000 mg | DELAYED_RELEASE_TABLET | Freq: Two times a day (BID) | ORAL | 3 refills | Status: DC

## 2018-07-13 NOTE — Progress Notes (Signed)
Robert Cruz is a 42 y.o. male with the following history as recorded in EpicCare:  Patient Active Problem List   Diagnosis Date Noted  . Mixed hyperlipidemia 09/08/2017  . Allergic rhinitis 10/11/2016  . Sore throat 10/11/2016  . Chronic migraine without aura without status migrainosus, not intractable 01/14/2016  . 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 Generalized headache 12/16/2015  . Encounter for preventive health examination 09/06/2013  . GERD (gastroesophageal reflux disease) 09/06/2013  . DYSPHAGIA UNSPECIFIED 02/08/2011  . OSA (obstructive sleep apnea) 12/28/2008  . Severe obesity (BMI >= 40) ([place]) 10/07/2008  . Essential hypertension 08/07/2007    Current Outpatient Medications  Medication Sig Dispense Refill  . atenolol (TENORMIN) 100 MG tablet Take 1 tablet (100 mg total) by mouth daily. 90 tablet 1  . hydrochlorothiazide (HYDRODIURIL) 25 MG tablet Take 1 tablet (25 mg total) by mouth daily. 90 tablet 2  . lisinopril (PRINIVIL,ZESTRIL) 20 MG tablet Take 1 tablet (20 mg total) by mouth daily. 90 tablet 2  . spironolactone (ALDACTONE) 100 MG tablet Take 1 and a half tablets (150 mg) once daily 45 tablet 9  . pantoprazole (PROTONIX) 40 MG tablet Take 1 tablet (40 mg total) by mouth 2 (two) times daily. 60 tablet 3   No current facility-administered medications for this visit.     Allergies: Amlodipine and Bystolic [nebivolol hcl]  Past Medical History:  Diagnosis Date  . Chicken pox   . GERD (gastroesophageal reflux disease)   . Hypertension   . Morbid obesity ([place])   . Sleep apnea     History reviewed. No pertinent surgical history.  Family History  Problem Relation Age of Onset  . Hypertension Father   . Depression Father   . Hypertension Mother   . Kidney disease Maternal Grandmother        Dialysis  . Hypertension Maternal Grandmother   . Healthy Brother        x1  . Healthy Sister        x3  . Colon cancer Neg Hx   . Esophageal cancer Neg Hx   .  Rectal cancer Neg Hx   . Stomach cancer Neg Hx     Social History   Tobacco Use  . Smoking status: Never Smoker  . Smokeless tobacco: Never Used  Substance Use Topics  . 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 Difficult to exercise on a daily basis due to his work schedule.  Review of Systems  Constitutional: Positive for malaise/fatigue.  HENT: Negative for hearing loss.   Eyes: Negative.   Respiratory: Negative for cough and shortness of breath.   Cardiovascular: Negative for chest pain and palpitations.  Gastrointestinal: Positive for abdominal pain and heartburn. Negative for constipation and diarrhea.  Genitourinary: Negative.   Musculoskeletal: Negative for myalgias.  Skin: Negative.   Neurological: Negative for dizziness and headaches.  Endo/Heme/Allergies: Negative.   Psychiatric/Behavioral: Negative for depression. The patient is not nervous/anxious and does not have insomnia.      Objective:  Vitals:   07/13/18 0841  BP: 138/90  Pulse:  78  Temp: 97.7 F (36.5 C)  TempSrc: Oral  SpO2: 96%  Weight: (!) 336 lb (152.4 kg)  Height: '6\' 1"'  (1.854 m)    General: Well developed, well nourished, in no acute distress  Skin : Warm and dry.  Head: Normocephalic and atraumatic  Eyes: Sclera and conjunctiva clear; pupils round and reactive to light; extraocular movements intact  Ears: External normal; canals clear; tympanic membranes normal  Oropharynx: Pink, supple. No suspicious lesions  Neck: Supple without thyromegaly, adenopathy  Lungs:  Respirations unlabored; clear to auscultation bilaterally without wheeze, rales, rhonchi  CVS exam: normal rate and regular rhythm.  Abdomen: Soft; nontender; nondistended; normoactive bowel sounds; no masses or hepatosplenomegaly  Musculoskeletal: No deformities; no active joint inflammation  Extremities: No edema, cyanosis, clubbing  Vessels: Symmetric bilaterally  Neurologic: Alert and oriented; speech intact; face symmetrical; moves all extremities well; CNII-XII intact without focal deficit   Assessment:  1. PE (physical exam), annual   2. Obesity with serious comorbidity, unspecified classification, unspecified obesity type   3. Lipid screening   4. Other fatigue   5. Prostate cancer screening     Plan:  Age appropriate preventive healthcare needs addressed; encouraged regular eye doctor and dental exams; encouraged regular exercise; will update labs and refills as needed today; follow-up to be determined; Tdap updated; Will try treating GERD with Protonix 40 mg- use bid x 2 weeks and then back down to qd; dietary changes discussed/ referral to weight loss clinic; if symptoms do not respond to Protonix, patient to call back and will refer to GI; Keep planned follow-up with cardiology for October 2019;   No follow-ups on file.  Orders Placed This Encounter  Procedures  . Tdap vaccine greater than or equal to 7yo IM  . CBC w/Diff    Standing Status:   Future    Number of Occurrences:   1    Standing Expiration Date:   07/13/2019  . Comp Met (CMET)    Standing Status:   Future    Number of Occurrences:   1    Standing Expiration Date:   07/13/2019  . Lipid panel    Standing Status:   Future    Number of Occurrences:   1    Standing Expiration Date:   07/14/2019  . TSH    Standing Status:   Future    Number of Occurrences:   1    Standing Expiration Date:   07/13/2019  . Vitamin D (25 hydroxy)    Standing Status:   Future    Number of Occurrences:   1    Standing Expiration  Date:   07/13/2019  . PSA    Standing Status:   Future    Number of Occurrences:   1    Standing Expiration Date:   07/13/2019  . Testosterone, Free, Total, SHBG    Standing Status:   Future    Number of Occurrences:   1    Standing Expiration Date:   07/13/2019  . Amb Ref to Medical Weight Management    Referral Priority:   Routine    Referral Type:   Consultation    Referred to Provider:   Starlyn Skeans, MD    Number of Visits Requested:   1    Requested Prescriptions   Signed Prescriptions Disp Refills  . pantoprazole (PROTONIX) 40 MG tablet 60 tablet 3    Sig: Take 1 tablet (40 mg total) by mouth 2 (two) times daily.

## 2018-07-13 NOTE — Patient Instructions (Signed)
Food Choices for Gastroesophageal Reflux Disease, Adult When you have gastroesophageal reflux disease (GERD), the foods you eat and your eating habits are very important. Choosing the right foods can help ease your discomfort. What guidelines do I need to follow?  Choose fruits, vegetables, whole grains, and low-fat dairy products.  Choose low-fat meat, fish, and poultry.  Limit fats such as oils, salad dressings, butter, nuts, and avocado.  Keep a food diary. This helps you identify foods that cause symptoms.  Avoid foods that cause symptoms. These may be different for everyone.  Eat small meals often instead of 3 large meals a day.  Eat your meals slowly, in a place where you are relaxed.  Limit fried foods.  Cook foods using methods other than frying.  Avoid drinking alcohol.  Avoid drinking large amounts of liquids with your meals.  Avoid bending over or lying down until 2-3 hours after eating. What foods are not recommended? These are some foods and drinks that may make your symptoms worse: Vegetables  Tomatoes. Tomato juice. Tomato and spaghetti sauce. Chili peppers. Onion and garlic. Horseradish. Fruits  Oranges, grapefruit, and lemon (fruit and juice). Meats  High-fat meats, fish, and poultry. This includes hot dogs, ribs, ham, sausage, salami, and bacon. Dairy  Whole milk and chocolate milk. Sour cream. Cream. Butter. Ice cream. Cream cheese. Drinks  Coffee and tea. Bubbly (carbonated) drinks or energy drinks. Condiments  Hot sauce. Barbecue sauce. Sweets/Desserts  Chocolate and cocoa. Donuts. Peppermint and spearmint. Fats and Oils  High-fat foods. This includes French fries and potato chips. Other  Vinegar. Strong spices. This includes black pepper, white pepper, red pepper, cayenne, curry powder, cloves, ginger, and chili powder. The items listed above may not be a complete list of foods and drinks to avoid. Contact your dietitian for more information.    This information is not intended to replace advice given to you by your health care provider. Make sure you discuss any questions you have with your health care provider. Document Released: 05/22/2012 Document Revised: 04/28/2016 Document Reviewed: 09/25/2013 Elsevier Interactive Patient Education  2017 Elsevier Inc.  

## 2018-07-15 LAB — TESTOSTERONE, FREE, TOTAL, SHBG
Sex Hormone Binding: 27.8 nmol/L (ref 16.5–55.9)
TESTOSTERONE FREE: 11.4 pg/mL (ref 6.8–21.5)
Testosterone: 300 ng/dL (ref 264–916)

## 2018-07-16 ENCOUNTER — Telehealth: Payer: Self-pay

## 2018-07-16 ENCOUNTER — Telehealth: Payer: Self-pay | Admitting: Family

## 2018-07-16 MED ORDER — RANITIDINE HCL 150 MG PO TABS: 150.0000 mg | ORAL_TABLET | Freq: Every day | ORAL | 2 refills | Status: DC

## 2018-07-16 NOTE — Telephone Encounter (Signed)
I received a prior-auth from his insurance which I completed today but looks like turnaround is 3-5 days for decision. Should he just wait it out until approval or do you want him to take 1 a day if that is what insurance will cover?

## 2018-07-16 NOTE — Telephone Encounter (Signed)
Key: [id number]: [id number]   Started today via cover my meds.   Your information has been submitted to [facility] and is being reviewed. You may close this dialog, return to your dashboard, and perform other tasks. You will receive an electronic determination in CoverMyMeds within 72-120 hours; you'll also receive a faxed copy. You can see the latest determination by locating this request on your dashboard or reopening this request. If [employer] [facility] has not responded in 120 hours, contact [employer] at [phone]

## 2018-07-16 NOTE — Telephone Encounter (Signed)
Let's try to get the PA for bid; In the interim, ask him to use Protonix in the am and Zantac 150 mg at bedtime. I will send that in for him.

## 2018-07-16 NOTE — Telephone Encounter (Signed)
Copied from CRM [id number]. Topic: Quick Communication - See Telephone Encounter >> Jul 16, 2018 11:14 AM Tamela OddiMartin, Don'Quash[place], NT wrote: CRM for notification. See Telephone encounter for: 07/16/18. Patient called and states that he was prescribed pantoprazole (PROTONIX) 40 MG tablet and his insurance is rejecting the dosage. States he is approved for 1 tablet a day not 2 tablets. He states that he want to take the maximum amount possible because he is having a lot of problems with his acid reflux. He is wondering if the nurse or provider can get this approved.  Please call patient when this matter is resolved. CB# [phone]. Please advise

## 2018-07-16 NOTE — Telephone Encounter (Signed)
LOV  07/13/18 Ria ClockLaura Murray See pt. Request.

## 2018-07-17 ENCOUNTER — Other Ambulatory Visit: Payer: Self-pay | Admitting: Family

## 2018-07-17 ENCOUNTER — Telehealth: Payer: Self-pay | Admitting: Family

## 2018-07-17 DIAGNOSIS — R7989 Other specified abnormal findings of blood chemistry: Secondary | ICD-10-CM

## 2018-07-17 MED ORDER — VITAMIN D (ERGOCALCIFEROL) 1.25 MG (50000 UNIT) PO CAPS: 50000.0000 [IU] | ORAL_CAPSULE | ORAL | 0 refills | Status: DC

## 2018-07-17 NOTE — Telephone Encounter (Signed)
Patient given info. Please proceed with nephrologist referral.  Thanks

## 2018-07-17 NOTE — Telephone Encounter (Signed)
Created CRM with info incase patient calls back.

## 2018-07-17 NOTE — Telephone Encounter (Signed)
Pt given results per notes of Ria ClockLaura Murray on  07/16/18. Unable to document in result note due to [facility]. Please send Vitamin D supplement to the pharmacy.  Please order lab work and needs appointment in 6 months for testosterone, Vitamin D, fast glucose. Pt would like nephrologist referral.  1. Per Vernona RiegerLaura his testosterone level is at the low end of normal. She thinks this will improve with continued weight loss. She would like to plan to re-check this in about 6 months.  2. His Vitamin D is low. A script for a supplement for  12 weeks has been sent in and then needs to take daily Vitamin D3 daily 2000 IU over the counter. His HDL ( good cholesterol) is lower than goal. Vernona RiegerLaura thinks his weight loss plan will help improve this; plan to re-check in 6 months;  3. Fasting glucose was elevated- c/w pre-diabetes; limit intake of refine sugras/ fatty foods; re-check in 6 months;  4. His creatinine level is slightly elevated- it looks like this has been this way for about a year. Vernona RiegerLaura knows he had extensive testing to manage his blood pressure. Is he under the care of a kidney specialist? If not, may be good to consult and see if any other medications should be adjusted to prevent long-term kidney complications. 5. Regarding his medication for the Protonix I have started the Prior auth for approval. In the meantime Vernona RiegerLaura would like for him to use Protonix in the am and Zantac 150 mg at bedtime. She has sent this in for him as well. I will notify him once I hear back from insurance about approval or denial.  Please let me know his response about the kidney specialist.

## 2018-07-24 ENCOUNTER — Other Ambulatory Visit: Payer: Self-pay

## 2018-07-24 ENCOUNTER — Telehealth: Payer: Self-pay

## 2018-07-24 MED ORDER — PANTOPRAZOLE SODIUM 40 MG PO TBEC: 40.0000 mg | DELAYED_RELEASE_TABLET | Freq: Every day | ORAL | 3 refills | Status: DC

## 2018-07-24 NOTE — Progress Notes (Signed)
Updated script for patient since insurance denied prior auth for it to be taken BID.

## 2018-08-09 NOTE — Telephone Encounter (Signed)
Taken care of already

## 2018-08-27 ENCOUNTER — Emergency Department (HOSPITAL_BASED_OUTPATIENT_CLINIC_OR_DEPARTMENT_OTHER)
Admission: EM | Admit: 2018-08-27 | Discharge: 2018-08-27 | Disposition: A | Discharge status: Home / Self Care | Payer: Managed Care, Other (non HMO) | Attending: Emergency Medicine | Admitting: Emergency Medicine

## 2018-08-27 ENCOUNTER — Other Ambulatory Visit: Payer: Self-pay

## 2018-08-27 ENCOUNTER — Encounter (HOSPITAL_BASED_OUTPATIENT_CLINIC_OR_DEPARTMENT_OTHER): Payer: Self-pay | Admitting: *Deleted

## 2018-08-27 DIAGNOSIS — I10 Essential (primary) hypertension: Secondary | ICD-10-CM | POA: Insufficient documentation

## 2018-08-27 DIAGNOSIS — Z79899 Other long term (current) drug therapy: Secondary | ICD-10-CM | POA: Insufficient documentation

## 2018-08-27 DIAGNOSIS — M545 Low back pain, unspecified: Secondary | ICD-10-CM

## 2018-08-27 LAB — URINALYSIS, ROUTINE W REFLEX MICROSCOPIC
Bilirubin Urine: NEGATIVE
Glucose, UA: NEGATIVE mg/dL
Hgb urine dipstick: NEGATIVE
Ketones, ur: NEGATIVE mg/dL
Leukocytes, UA: NEGATIVE
NITRITE: NEGATIVE
Protein, ur: NEGATIVE mg/dL
SPECIFIC GRAVITY, URINE: 1.015 (ref 1.005–1.030)
pH: 6 (ref 5.0–8.0)

## 2018-08-27 NOTE — ED Provider Notes (Signed)
[facility] EMERGENCY DEPARTMENT Provider Note   CSN: [id number] Arrival date & time: 08/27/18  1828     History   Chief Complaint Chief Complaint  Patient presents with  . Flank Pain    HPI Robert Cruz is a 42 y.o. male with past medical history of hypertension, presenting to the ED with complaint of the onset of right low back pain that began on Thursday.  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  He does report playing basketball a few days ago.  Pain is located on the right low back with radiation towards the right flank.  Pain is worse with twisting his torso.  Ibuprofen provided relief of symptoms.  Denies fever, urinary symptoms, nausea or vomiting, bowel or bladder incontinence, numbness or weakness, history of cancer or IV drug use, fever.  The history is provided by the patient.    Past Medical History:  Diagnosis Date  . Chicken pox   . GERD (gastroesophageal reflux disease)   . Hypertension   . Morbid obesity (HCC)   . Sleep apnea     Patient Active Problem List   Diagnosis Date Noted  . Mixed hyperlipidemia 09/08/2017  . Allergic rhinitis 10/11/2016  . Sore throat 10/11/2016  . Chronic migraine without aura without status migrainosus, not intractable 01/14/2016  . 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 Generalized headache 12/16/2015  . Encounter for preventive health examination 09/06/2013  . GERD (gastroesophageal reflux disease) 09/06/2013  . DYSPHAGIA UNSPECIFIED 02/08/2011  . OSA (obstructive sleep apnea) 12/28/2008  . Severe obesity (BMI >= 40) (HCC) 10/07/2008  . Essential hypertension 08/07/2007    History reviewed. No pertinent surgical history.      Home Medications    Prior to Admission medications   Medication Sig Start Date End Date Taking? Authorizing Provider  atenolol (TENORMIN) 100 MG tablet Take 1 tablet (100 mg total) by mouth daily. 04/06/18   Nahser,  Deloris PingPhilip J, MD  hydrochlorothiazide (HYDRODIURIL) 25 MG tablet Take 1 tablet (25 mg total) by mouth daily. 01/19/18   Nahser, Deloris PingPhilip J, MD  lisinopril (PRINIVIL,ZESTRIL) 20 MG tablet Take 1 tablet (20 mg total) by mouth daily. 01/19/18   Nahser, Deloris PingPhilip J, MD  pantoprazole (PROTONIX) 40 MG tablet Take 1 tablet (40 mg total) by mouth daily. 07/24/18 11/21/18  Olive BassMurray, Laura Woodruff, FNP  ranitidine (ZANTAC) 150 MG tablet Take 1 tablet (150 mg total) by mouth at bedtime. 07/16/18   Olive BassMurray, Laura Woodruff, FNP  spironolactone (ALDACTONE) 100 MG tablet Take 1 and a half tablets (150 mg) once daily 12/18/17   Nahser, Deloris PingPhilip J, MD  Vitamin D, Ergocalciferol, (DRISDOL) 50000 units CAPS capsule Take 1 capsule (50,000 Units total) by mouth every 7 (seven) days for 12 doses. 07/17/18 10/03/18  Olive BassMurray, Laura Woodruff, FNP    Family History Family History  Problem Relation Age of Onset  . Hypertension Father   . Depression Father   . Hypertension Mother   . Kidney disease Maternal Grandmother        Dialysis  . Hypertension Maternal Grandmother   . Healthy Brother        x1  . Healthy Sister        x3  . Colon cancer Neg Hx   . Esophageal cancer Neg Hx   . Rectal cancer Neg Hx   . Stomach cancer Neg Hx     Social History Social History  Tobacco Use  . Smoking status: Never Smoker  . Smokeless tobacco: Never Used  Substance Use Topics  . Alcohol use: No  . Drug use: No     Allergies   Amlodipine and Bystolic [nebivolol hcl]   Review of Systems Review of Systems  Constitutional: Negative for fever.  Gastrointestinal: Negative for abdominal pain, nausea and vomiting.  Genitourinary: Negative for dysuria, frequency and hematuria.  Musculoskeletal: Positive for back pain.  Neurological: Negative for weakness and numbness.  All other systems reviewed and are negative.    Physical Exam Updated Vital Signs BP 112/76 (BP Location: Left Arm)   Pulse 68   Temp 98.1 F (36.7 C) (Oral)    Resp 19   Ht 6\' 1"  (1.854 m)   Wt (!) 145.2 kg   SpO2 98%   BMI 42.22 kg/m   Physical Exam  Constitutional: He appears well-developed and well-nourished. No distress.  HENT:  Head: Normocephalic and atraumatic.  Eyes: Conjunctivae are normal.  Cardiovascular: Normal rate.  Pulmonary/Chest: Effort normal.  Abdominal: Soft. Bowel sounds are normal. He exhibits no distension and no mass. There is no tenderness.  Musculoskeletal:  No midline spinal tenderness.  There is some tenderness and spasm to the right paraspinal musculature extending towards right flank.  Normal range of motion of back in all extremities.  Neurological:  Motor:  Normal tone. 5/5 in lower extremities bilaterally including strong and equal dorsiflexion/plantar flexion Sensory: Pinprick and light touch normal in BLE extremities.  Deep Tendon Reflexes: 2+ and symmetric in the b/l patella Gait: normal gait and balance CV: distal pulses palpable throughout    Psychiatric: He has a normal mood and affect. His behavior is normal.  Nursing note and vitals reviewed.    ED Treatments / Results  Labs (all labs ordered are listed, but only abnormal results are displayed) Labs Reviewed  URINALYSIS, ROUTINE W REFLEX MICROSCOPIC    EKG None  Radiology No results found.  Procedures Procedures (including critical care time)  Medications Ordered in ED Medications - No data to display   Initial Impression / Assessment and Plan / ED Course  I have reviewed the triage vital signs and the nursing notes.  Pertinent labs & imaging results that were available during my care of the patient were reviewed by me and considered in my medical decision making (see chart for details).    Patient with back pain.  No neurological deficits and normal neuro exam.  No urinary sx. U/A neg. Patient can ambulate without difficulty.  No loss of bowel or bladder control.  No concern for cauda equina.  No fever, night sweats, weight  loss, h/o cancer, IVDU.  RICE protocol and pain medicine indicated and discussed with patient.   Discussed results, findings, treatment and follow up. Patient advised of return precautions. Patient verbalized understanding and agreed with plan.   Final Clinical Impressions(s) / ED Diagnoses   Final diagnoses:  Acute right-sided low back pain without sciatica    ED Discharge Orders    None       Robinson, [place] N, PA-C 08/27/18 2033    Terrilee Files, MD 08/28/18 1147

## 2018-08-27 NOTE — Discharge Instructions (Addendum)
Please read instructions below.  You can take 600 mg of ibuprofen every 6 hours as needed for pain.   Apply ice to your back for 20 minutes at a time.  You can also apply heat if this provides more relief.   Follow-up with your primary care provider symptoms persist.   Return to ER if new numbness or tingling in your arms or legs, inability to urinate, inability to hold your bowels, weakness in your extremities, fever, or nausea/vomiting.

## 2018-08-27 NOTE — ED Triage Notes (Signed)
Right flank pain that feels like a pulled muscle. Pain started after playing basketball a few days ago.

## 2018-09-26 ENCOUNTER — Encounter: Payer: Self-pay | Admitting: Cardiovascular Disease

## 2018-09-26 ENCOUNTER — Ambulatory Visit (INDEPENDENT_AMBULATORY_CARE_PROVIDER_SITE_OTHER): Payer: Managed Care, Other (non HMO) | Admitting: Cardiovascular Disease

## 2018-09-26 VITALS — BP 132/88 | HR 74 | Ht 73.0 in | Wt 345.0 lb

## 2018-09-26 DIAGNOSIS — E782 Mixed hyperlipidemia: Secondary | ICD-10-CM

## 2018-09-26 DIAGNOSIS — I10 Essential (primary) hypertension: Secondary | ICD-10-CM

## 2018-09-26 MED ORDER — HYDROCHLOROTHIAZIDE 25 MG PO TABS: 25.0000 mg | ORAL_TABLET | Freq: Every day | ORAL | 3 refills | Status: DC

## 2018-09-26 MED ORDER — SPIRONOLACTONE 100 MG PO TABS: ORAL_TABLET | ORAL | 3 refills | Status: DC

## 2018-09-26 MED ORDER — LISINOPRIL 20 MG PO TABS: 20.0000 mg | ORAL_TABLET | Freq: Every day | ORAL | 3 refills | Status: DC

## 2018-09-26 MED ORDER — ATENOLOL 100 MG PO TABS: 100.0000 mg | ORAL_TABLET | Freq: Every day | ORAL | 3 refills | Status: DC

## 2018-09-26 NOTE — Progress Notes (Signed)
Cardiology Office Note   Date:  09/26/2018   ID:  Robert Cruz, DOB 20-Oct-1976, MRN [id number]  PCP:  Olive Bass, FNP  Cardiologist:   Kristeen Miss, MD   Chief Complaint  Patient presents with  . Hypertension  . Hyperlipidemia   Problem List 1. Essential HTN 2. Obstructive sleep apnea 3, GERD    Previous notes:  Robert Cruz is a 42 y.o. male who presents for evaluation of his HTN He has been on multiple meds with minimal response  Very busy at work .    Not a lot of time to exercise  Has been on a better diet for the past 1 year  or so.  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  No DOE doing fairly moderate exercise ( ie no dyspnea walking up the stadium steps at the [employer] a month ago )  Has not lost any weight on his new diet.   Typically break fast - eggs, [place] sausage /turkey bacon  Lunch - subway - 6 inch or salad   - used to eat lots of fried foods, [employer], chick fil a  Dinner - spag. Occasionally take out from [employer]   Apr 05, 2016:  Robert Cruz is seen back today for follow up of his HTN Echo shows normal LV function with moderate LAE  Has tried clonidine -  Was not effective so it was stopped  Hydralazine was substituted.  BP is still high  Has tried Bystolic - caused a severe headache Still drinking lots of sweet tea Lots of juices. Still eats out quite a bit .  Weight is 343 today .  I advised him to lose 5-10 lbs before his next visit .   May 09, 2016  weight is 341 today  BP is elevated.  Has OSA - has been using CPAP , may need some adjustment on the mask gasket  Has been working lots - worked 24 straight days  Avoiding salt    Oct. 18, 2017:  Robert Cruz is seen back for office visit for HTN Has done well on the Aldactone Feeling better.  Headaches have resolved,   Leg edema has resolved.    09/08/2017 Works now for [employer] movement [employer].  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 Still drinking sweet tea.   Wt = 334 ( down 6 lbs from last year)  Having some abd. Pain after eating  Symptoms sound like esophageal stricture /   hiatal hernia / or  GERD  Has had esophageal dilitation in the past.  Has cut out greasy foods.   Does eat some bacon on occasion.   September 26, 2018:  Robert Cruz is seen today for follow-up visit.  He has a history of morbid obesity, hypertension, hyperlipidemia.  His weight today is 345 pounds 11 pounds from last year. Trying to avoid salt .  Is not drinking sweat tea .  Will be seeing a nutritiionist this week .  No CP or dyspnea.  Working for [employer]    Past Medical History:  Diagnosis Date  . Chicken pox   . GERD (gastroesophageal reflux disease)   . Hypertension   . Morbid obesity (HCC)   . Sleep apnea     History reviewed. No pertinent surgical history.  Current Outpatient Medications  Medication Sig Dispense Refill  . atenolol (TENORMIN) 100 MG tablet Take 1 tablet (100 mg total) by mouth daily. 90 tablet 1  . hydrochlorothiazide (HYDRODIURIL) 25 MG tablet Take 1 tablet (25 mg total) by mouth daily. 90 tablet 2  . lisinopril (PRINIVIL,ZESTRIL) 20 MG tablet Take 1 tablet (20 mg total) by mouth daily. 90 tablet 2  . spironolactone (ALDACTONE) 100 MG tablet Take 1 and a half tablets (150 mg) once daily 45 tablet 9   No current facility-administered medications for this visit.     Allergies:   Amlodipine and Bystolic [nebivolol hcl]    Social History:  The patient  reports that he has never smoked. He has never used smokeless tobacco. He reports that he does not drink alcohol or use drugs.   Family History:  The patient's family history includes Depression in his father; Healthy in his brother and sister; Hypertension in his father, maternal grandmother, and mother; Kidney disease in his maternal  grandmother.    ROS:  Please see the history of present illness.     Physical Exam: Blood pressure 132/88, pulse 74, height 6\' 1"  (1.854 m), weight (!) 345 lb (156.5 kg), SpO2 97 %.  GEN:  Well nourished, well developed in no acute distress HEENT: Normal NECK: No JVD; No carotid bruits LYMPHATICS: No lymphadenopathy CARDIAC: RRR  RESPIRATORY:  Clear to auscultation without rales, wheezing or rhonchi  ABDOMEN: Soft, non-tender, non-distended MUSCULOSKELETAL:  No edema; No deformity  SKIN: Warm and dry NEUROLOGIC:  Alert and oriented x 3   EKG:    September 26, 2018: Normal sinus rhythm at 74.  Normal EKG.  Recent Labs: 07/13/2018: ALT 14; BUN 20; Creatinine, Ser 1.54; Hemoglobin 11.8; Platelets 337.0; Potassium 4.7; Sodium 135; TSH 1.13    Lipid Panel    Component Value Date/Time   CHOL 160 07/13/2018 0931   CHOL 167 09/08/2017 0921   TRIG 123.0 07/13/2018 0931   HDL 31.90 (L) 07/13/2018 0931   HDL 34 (L) 09/08/2017 0921   CHOLHDL 5 07/13/2018 0931   VLDL 24.6 07/13/2018 0931   LDLCALC 104 (H) 07/13/2018 0931   LDLCALC 110 (H) 09/08/2017 0921      Wt Readings from Last 3 Encounters:  09/26/18 (!) 345 lb (156.5 kg)  08/27/18 (!) 320 lb (145.2 kg)  07/13/18 (!) 336 lb (152.4 kg)      Other studies Reviewed: Additional studies/ records that were reviewed today include: . Review of the above records demonstrates:    ASSESSMENT AND PLAN:  1.  Essential HTN-  .   Diastolic blood pressure is a little elevated today.  He needs to work on weight loss.  He also is going to cut out his [place] sausage.  2. Obesity :    Seeing a nutritionist.    2. Leg edema - basically resolved,       3. Hyperlipidemia:  Mildly elevated.  Will check labs today  Mildly elevate4d, encouraged weight loss  4. Obstructive sleep apnea     Current medicines are reviewed at length with the patient today.  The patient does not have concerns regarding medicines.  The following changes  have been made:  We'll discontinue metoprolol and try carvedilol 25 mg twice a day.  Labs/ tests ordered today include:   No orders of the defined types were placed in this encounter.   Disposition:       Kristeen Miss, MD  09/26/2018 9:24 AM    [facility] [place], [place], [place]  [address] Phone: [phone]; Fax: [phone]

## 2018-09-26 NOTE — Patient Instructions (Signed)

## 2018-09-27 ENCOUNTER — Encounter (INDEPENDENT_AMBULATORY_CARE_PROVIDER_SITE_OTHER): Payer: Self-pay

## 2018-10-01 ENCOUNTER — Telehealth: Payer: Self-pay | Admitting: Family

## 2018-10-01 ENCOUNTER — Other Ambulatory Visit: Payer: Self-pay | Admitting: Family

## 2018-10-01 MED ORDER — PANTOPRAZOLE SODIUM 40 MG PO TBEC: 40.0000 mg | DELAYED_RELEASE_TABLET | Freq: Every day | ORAL | 1 refills | Status: DC

## 2018-10-01 NOTE — Telephone Encounter (Signed)
Copied from CRM [id number]. Topic: Quick Communication - See Telephone Encounter >> Oct 01, 2018  3:11 PM Terisa Starr wrote: CRM for notification. See Telephone encounter for: 10/01/18.  Patient is requesting a refill on his pantoprazole (PROTONIX) 40 MG tablet [[id number]]  DISCONTINUED because he states this works so, so much better than the ranitidine (ZANTAC) 150 MG tablet [[id number]]  DISCONTINUED. He said that the Zantac is not working at all. He is even taking 2 times a day and still no relief. Please Advise.  [facility] [address], [place] - [address] [place] [address]

## 2018-10-01 NOTE — Telephone Encounter (Signed)
The Protonix has been refilled;

## 2018-10-01 NOTE — Telephone Encounter (Signed)
Message sent to patient today via my-chart regarding refills.

## 2018-10-08 ENCOUNTER — Ambulatory Visit (INDEPENDENT_AMBULATORY_CARE_PROVIDER_SITE_OTHER): Payer: Self-pay | Admitting: Family Medicine

## 2018-10-10 ENCOUNTER — Ambulatory Visit (INDEPENDENT_AMBULATORY_CARE_PROVIDER_SITE_OTHER): Payer: Self-pay | Admitting: Family Medicine

## 2018-10-24 ENCOUNTER — Ambulatory Visit (INDEPENDENT_AMBULATORY_CARE_PROVIDER_SITE_OTHER): Payer: Self-pay | Admitting: Family Medicine

## 2018-11-13 ENCOUNTER — Other Ambulatory Visit: Payer: Self-pay | Admitting: Cardiovascular Disease

## 2018-12-18 ENCOUNTER — Encounter (INDEPENDENT_AMBULATORY_CARE_PROVIDER_SITE_OTHER): Payer: Self-pay

## 2018-12-26 ENCOUNTER — Ambulatory Visit (INDEPENDENT_AMBULATORY_CARE_PROVIDER_SITE_OTHER): Payer: Self-pay | Admitting: Bariatrics

## 2019-01-02 ENCOUNTER — Other Ambulatory Visit: Payer: Self-pay | Admitting: Family

## 2019-01-02 ENCOUNTER — Telehealth: Payer: Self-pay | Admitting: Family

## 2019-01-02 DIAGNOSIS — Z3009 Encounter for other general counseling and advice on contraception: Secondary | ICD-10-CM

## 2019-01-02 NOTE — Telephone Encounter (Signed)
Referral to urology done as requested.

## 2019-01-02 NOTE — Telephone Encounter (Signed)
My-chart message sent to patient today regarding referral. 

## 2019-01-09 ENCOUNTER — Ambulatory Visit (INDEPENDENT_AMBULATORY_CARE_PROVIDER_SITE_OTHER): Payer: Self-pay | Admitting: Bariatrics

## 2019-01-25 ENCOUNTER — Ambulatory Visit: Payer: Managed Care, Other (non HMO) | Admitting: Family

## 2019-01-25 ENCOUNTER — Encounter: Payer: Self-pay | Admitting: Family

## 2019-01-25 VITALS — BP 126/80 | HR 70 | Temp 98.0°F | Ht 73.0 in | Wt 346.1 lb

## 2019-01-25 DIAGNOSIS — K2289 Other specified disease of esophagus: Secondary | ICD-10-CM

## 2019-01-25 DIAGNOSIS — K228 Other specified diseases of esophagus: Secondary | ICD-10-CM

## 2019-01-25 MED ORDER — SUCRALFATE 1 GM/10ML PO SUSP: 1.0000 g | Freq: Two times a day (BID) | ORAL | 0 refills | Status: DC

## 2019-01-25 NOTE — Progress Notes (Signed)
Robert Cruz is a 43 y.o. male with the following history as recorded in [facility]:  Patient Active Problem List   Diagnosis Date Noted  . Mixed hyperlipidemia 09/08/2017  . Allergic rhinitis 10/11/2016  . Sore throat 10/11/2016  . Chronic migraine without aura without status migrainosus, not intractable 01/14/2016  . 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 Generalized headache 12/16/2015  . Encounter for preventive health examination 09/06/2013  . GERD (gastroesophageal reflux disease) 09/06/2013  . DYSPHAGIA UNSPECIFIED 02/08/2011  . OSA (obstructive sleep apnea) 12/28/2008  . Severe obesity (BMI >= 40) (HCC) 10/07/2008  . Essential hypertension 08/07/2007    Current Outpatient Medications  Medication Sig Dispense Refill  . atenolol (TENORMIN) 100 MG tablet Take 1 tablet (100 mg total) by mouth daily. 90 tablet 3  . hydrochlorothiazide (HYDRODIURIL) 25 MG tablet Take 1 tablet (25 mg total) by mouth daily. 90 tablet 3  . lisinopril (PRINIVIL,ZESTRIL) 20 MG tablet Take 1 tablet (20 mg total) by mouth daily. 90 tablet 3  . pantoprazole (PROTONIX) 40 MG tablet Take 1 tablet (40 mg total) by mouth daily. 90 tablet 1  . spironolactone (ALDACTONE) 100 MG tablet Take 1 and a half tablets (150 mg) once daily 135 tablet 3  . pantoprazole (PROTONIX) 40 MG tablet Take 1 tablet (40 mg total) by mouth daily. (Patient not taking: Reported on 01/25/2019) 90 tablet 1  . sucralfate (CARAFATE) 1 GM/10ML suspension Take 10 mLs (1 g total) by mouth 2 (two) times daily. 420 mL 0   No current facility-administered medications for this visit.     Allergies: Amlodipine and Bystolic [nebivolol hcl]  Past Medical History:  Diagnosis Date  . Chicken pox   . GERD (gastroesophageal reflux disease)   . Hypertension   . Morbid obesity (HCC)   . Sleep apnea     History reviewed. No pertinent surgical history.  Family History  Problem Relation Age of Onset  . Hypertension Father   . Depression Father   .  Hypertension Mother   . Kidney disease Maternal Grandmother        Dialysis  . Hypertension Maternal Grandmother   . Healthy Brother        x1  . Healthy Sister        x3  . Colon cancer Neg Hx   . Esophageal cancer Neg Hx   . Rectal cancer Neg Hx   . Stomach cancer Neg Hx     Social History   Tobacco Use  . Smoking status: Never Smoker  . Smokeless tobacco: Never Used  Substance Use Topics  . Alcohol use: No    Subjective:  Patient notes he was eating chips earlier this week and felt like he "scratched" the back of his throat; has not been having to take his Protonix regularly as his reflux has been well controlled; concerned that symptoms might be related to worsening acid reflux- concerned that food is going to get stuck in the back of his throat.     Objective:  Vitals:   01/25/19 1552  BP: 126/80  Pulse: 70  Temp: 98 F (36.7 C)  TempSrc: Oral  SpO2: 96%  Weight: (!) 346 lb 1.6 oz (157 kg)  Height: 6\' 1"  (1.854 m)    General: Well developed, well nourished, in no acute distress  Skin : Warm and dry.  Head: Normocephalic and atraumatic  Eyes: Sclera and conjunctiva clear; pupils round and reactive to light; extraocular movements intact  Ears: External normal; canals clear; tympanic  membranes normal  Oropharynx: Pink, supple. No suspicious lesions  Neck: Supple without thyromegaly, adenopathy  Lungs: Respirations unlabored; clear to auscultation bilaterally without wheeze, rales, rhonchi  Neurologic: Alert and oriented; speech intact; face symmetrical; moves all extremities well; CNII-XII intact without focal deficit   Assessment:  1. Esophageal pain     Plan:  Suspect he has scratched his throat with the potato chips; encouraged to use his Protonix 40 mg bid x 14 days; short-term use of Carafate solution to help coat esophagus as well; try to eat soft foods, avoid reflux- triggering foods; if no relief by early next week, call back and will refer to GI.   No  follow-ups on file.  No orders of the defined types were placed in this encounter.   Requested Prescriptions   Signed Prescriptions Disp Refills  . sucralfate (CARAFATE) 1 GM/10ML suspension 420 mL 0    Sig: Take 10 mLs (1 g total) by mouth 2 (two) times daily.

## 2019-01-31 ENCOUNTER — Ambulatory Visit: Payer: Managed Care, Other (non HMO) | Admitting: Cardiovascular Disease

## 2019-02-07 ENCOUNTER — Encounter: Payer: Self-pay | Admitting: Cardiology

## 2019-02-07 ENCOUNTER — Ambulatory Visit: Payer: Managed Care, Other (non HMO) | Admitting: Cardiology

## 2019-02-07 VITALS — BP 120/78 | HR 86 | Ht 73.0 in | Wt 337.8 lb

## 2019-02-07 DIAGNOSIS — I10 Essential (primary) hypertension: Secondary | ICD-10-CM | POA: Diagnosis not present

## 2019-02-07 MED ORDER — ATENOLOL 100 MG PO TABS: 100.0000 mg | ORAL_TABLET | Freq: Every day | ORAL | 3 refills | Status: DC

## 2019-02-07 MED ORDER — LISINOPRIL 20 MG PO TABS: 20.0000 mg | ORAL_TABLET | Freq: Every day | ORAL | 3 refills | Status: DC

## 2019-02-07 MED ORDER — SPIRONOLACTONE 100 MG PO TABS: ORAL_TABLET | ORAL | 3 refills | Status: DC

## 2019-02-07 MED ORDER — PANTOPRAZOLE SODIUM 40 MG PO TBEC: 40.0000 mg | DELAYED_RELEASE_TABLET | Freq: Every day | ORAL | 3 refills | Status: DC

## 2019-02-07 MED ORDER — HYDROCHLOROTHIAZIDE 25 MG PO TABS: 25.0000 mg | ORAL_TABLET | Freq: Every day | ORAL | 3 refills | Status: DC

## 2019-02-07 NOTE — Progress Notes (Signed)
Cardiology Office Note:    Date:  02/07/2019   ID:  Robert Cruz, DOB 06/14/1976, MRN [id number]  PCP:  Olive Bass, FNP  Cardiologist:  Kristeen Miss, MD  Referring MD: Olive Bass,*   Chief Complaint  Patient presents with  . Follow-up    Hypertension    History of Present Illness:    Robert Cruz is a 43 y.o. male with a past medical history significant for hypertension, HLD, GERD and morbid obesity. Negative for RAS in 06/2016 by ultrasound, although images were not very good.   He was last seen by Dr. Elease Hashimoto on 09/26/2018 at which time BP was under fair control and pt was going to go to a nutritionist for wt loss. His metoprolol was switched to carvedilol per note however pt is currently on atenolol 100 mg.   Robert Cruz is here today for 6 month follow up. He is a distance Naval architect. He reports better BP control over the last 2 years. He denies chest pain/pressure/tightness or shortness of breath. His reflux is well controlled on Protonix.   He is drinking smoothies with kale, berries and hemp seeds. He does eat a lot of sub sandwiches. No sodas or sweet tea any more. No alcohol. No smoking or drugs.   He works out on Saturday mornings. He tried to see a nutritionist but missed his appts due to his schedule.   He uses CPAP every night.   Past Medical History:  Diagnosis Date  . Chicken pox   . GERD (gastroesophageal reflux disease)   . Hypertension   . Morbid obesity (HCC)   . Sleep apnea     No past surgical history on file.  Current Medications: Current Meds  Medication Sig  . atenolol (TENORMIN) 100 MG tablet Take 1 tablet (100 mg total) by mouth daily.  . hydrochlorothiazide (HYDRODIURIL) 25 MG tablet Take 1 tablet (25 mg total) by mouth daily.  Marland Kitchen lisinopril (PRINIVIL,ZESTRIL) 20 MG tablet Take 1 tablet (20 mg total) by mouth daily.  . pantoprazole (PROTONIX) 40 MG tablet Take 1 tablet (40 mg total) by mouth daily.  Marland Kitchen  spironolactone (ALDACTONE) 100 MG tablet Take 1 and a half tablets (150 mg) once daily  . [DISCONTINUED] atenolol (TENORMIN) 100 MG tablet Take 1 tablet (100 mg total) by mouth daily.  . [DISCONTINUED] hydrochlorothiazide (HYDRODIURIL) 25 MG tablet Take 1 tablet (25 mg total) by mouth daily.  . [DISCONTINUED] lisinopril (PRINIVIL,ZESTRIL) 20 MG tablet Take 1 tablet (20 mg total) by mouth daily.  . [DISCONTINUED] pantoprazole (PROTONIX) 40 MG tablet Take 1 tablet (40 mg total) by mouth daily.  . [DISCONTINUED] spironolactone (ALDACTONE) 100 MG tablet Take 1 and a half tablets (150 mg) once daily     Allergies:   Amlodipine and Bystolic [nebivolol hcl]   Social History   Socioeconomic History  . Marital status: Significant Other    Spouse name: Not on file  . Number of children: 5  . Years of education: 60  . Highest education level: Not on file  Occupational History  . Occupation: Information systems manager: [employer]  Social Needs  . Financial resource strain: Not on file  . Food insecurity:    Worry: Not on file    Inability: Not on file  . Transportation needs:    Medical: Not on file    Non-medical: Not on file  Tobacco Use  . Smoking status: Never Smoker  . Smokeless tobacco: Never Used  Substance and Sexual Activity  . Alcohol use: No  . Drug use: No  . Sexual activity: Not on file  Lifestyle  . Physical activity:    Days per week: Not on file    Minutes per session: Not on file  . Stress: Not on file  Relationships  . Social connections:    Talks on phone: Not on file    Gets together: Not on file    Attends religious service: Not on file    Active member of club or organization: Not on file    Attends meetings of clubs or organizations: Not on file    Relationship status: Not on file  Other Topics Concern  . Not on file  Social History Narrative  . Not on file     Family History: The patient's family history includes Depression in his father; Healthy in  his brother and sister; Hypertension in his father, maternal grandmother, and mother; Kidney disease in his maternal grandmother. There is no history of Colon cancer, Esophageal cancer, Rectal cancer, or Stomach cancer. ROS:   Please see the history of present illness.     All other systems reviewed and are negative.  EKGs/Labs/Other Studies Reviewed:    The following studies were reviewed today:  Renal artery ultrasound 07/01/2016 IMPRESSION: 1. Unfortunately, the bilateral renal artery origins are not well seen secondary to a combination of obscuring bowel gas and patient body habitus. As such, ostial stenosis is not entirely excluded. The remainder of the visualized renal arteries demonstrate no evidence of stenosis or significant waveform abnormality which makes a proximal ostial stenosis fairly unlikely. If further imaging is clinically warranted to confirm the absence of stenosis, consider renal protocol CTA or MRA of the abdomen. 2. Probable mild hepatic steatosis.  Echocardiogram 01/07/2016 Study Conclusions - Left ventricle: The cavity size was normal. There was mild   concentric hypertrophy. Systolic function was normal. The   estimated ejection fraction was in the range of 55% to 60%. Wall   motion was normal; there were no regional wall motion   abnormalities. Early diastolic septal annular tissue Doppler   velocities Ea were mildly abnormal for age. Left ventricular   diastolic function parameters were normal. - Left atrium: The atrium was moderately dilated.  EKG:  EKG is not ordered today.   Recent Labs: 07/13/2018: ALT 14; BUN 20; Creatinine, Ser 1.54; Hemoglobin 11.8; Platelets 337.0; Potassium 4.7; Sodium 135; TSH 1.13   Recent Lipid Panel    Component Value Date/Time   CHOL 160 07/13/2018 0931   CHOL 167 09/08/2017 0921   TRIG 123.0 07/13/2018 0931   HDL 31.90 (L) 07/13/2018 0931   HDL 34 (L) 09/08/2017 0921   CHOLHDL 5 07/13/2018 0931   VLDL 24.6  07/13/2018 0931   LDLCALC 104 (H) 07/13/2018 0931   LDLCALC 110 (H) 09/08/2017 0921    Physical Exam:    VS:  BP 120/78   Pulse 86   Ht  (1.854 m)   Wt (!) 337 lb 12.8 oz (153.2 kg)   SpO2 98%   BMI 44.57 kg/m     Wt Readings from Last 3 Encounters:  02/07/19 (!) 337 lb 12.8 oz (153.2 kg)  01/25/19 (!) 346 lb 1.6 oz (157 kg)  09/26/18 (!) 345 lb (156.5 kg)     Physical Exam  Constitutional: He is oriented to person, place, and time. No distress.  Obese male  HENT:  Head: Normocephalic and atraumatic.  Neck: Normal range of motion.  Neck supple. No JVD present.  Cardiovascular: Normal rate, regular rhythm, normal heart sounds and intact distal pulses. Exam reveals no gallop and no friction rub.  No murmur heard. Pulmonary/Chest: Effort normal and breath sounds normal. No respiratory distress. He has no wheezes. He has no rales.  Abdominal: Soft. Bowel sounds are normal.  Musculoskeletal: Normal range of motion.        General: No deformity or edema.  Neurological: He is alert and oriented to person, place, and time.  Skin: Skin is warm and dry.  Psychiatric: He has a normal mood and affect. His behavior is normal. Judgment and thought content normal.     ASSESSMENT:    1. Essential hypertension    PLAN:    In order of problems listed above:  Hypertension -On atenolol 100 mg, HCTZ 25 mg, lisinopril 20 mg, spironolactone 150 mg.  -BP well controlled.  -Reinforced sodium restriction.  Obesity -Body mass index is 44.57 kg/m.  -Advised on a long term wt loss goal of ~50 pounds, shoot for about 8 pounds per month -Advised to increase exercise, getting out of his truck periodically during the day to take a 20 minute walk.   CKD -Followed by nephrology. Has appt in April  OSA -Uses CPAP consistently and feel a strong benefit from it.   Medication Adjustments/Labs and Tests Ordered: Current medicines are reviewed at length with the patient today.  Concerns  regarding medicines are outlined above. Labs and tests ordered and medication changes are outlined in the patient instructions below:  Patient Instructions  Medication Instructions:  Your physician recommends that you continue on your current medications as directed. Please refer to the Current Medication list given to you today.  If you need a refill on your cardiac medications before your next appointment, please call your pharmacy.   Lab work: None  If you have labs (blood work) drawn today and your tests are completely normal, you will receive your results only by: Marland Kitchen MyChart Message (if you have MyChart) OR . A paper copy in the mail If you have any lab test that is abnormal or we need to change your treatment, we will call you to review the results.  Testing/Procedures: None  Follow-Up: At [facility], you and your health needs are our priority.  As part of our continuing mission to provide you with exceptional heart care, we have created designated Provider Care Teams.  These Care Teams include your primary Cardiologist (physician) and Advanced Practice Providers (APPs -  Physician Assistants and Nurse Practitioners) who all work together to provide you with the care you need, when you need it. You will need a follow up appointment in:  6 months.  Please call our office 2 months in advance to schedule this appointment.  You may see Kristeen Miss, MD or one of the following Advanced Practice Providers on your designated Care Team: Tereso Newcomer, PA-C [place], [place] . Berton Bon, NP  Any Other Special Instructions Will Be Listed Below (If Applicable).   DASH Eating Plan DASH stands for "Dietary Approaches to Stop Hypertension." The DASH eating plan is a healthy eating plan that has been shown to reduce high blood pressure (hypertension). It may also reduce your risk for type 2 diabetes, heart disease, and stroke. The DASH eating plan may also help with weight loss. What are  tips for following this plan?  General guidelines  Avoid eating more than 2,300 mg (milligrams) of salt (sodium) a day. If you have  hypertension, you may need to reduce your sodium intake to 1,500 mg a day.  Limit alcohol intake to no more than 1 drink a day for nonpregnant women and 2 drinks a day for men. One drink equals 12 oz of beer, 5 oz of wine, or 1 oz of hard liquor.  Work with your health care provider to maintain a healthy body weight or to lose weight. Ask what an ideal weight is for you.  Get at least 30 minutes of exercise that causes your heart to beat faster (aerobic exercise) most days of the week. Activities may include walking, swimming, or biking.  Work with your health care provider or diet and nutrition specialist (dietitian) to adjust your eating plan to your individual calorie needs. Reading food labels   Check food labels for the amount of sodium per serving. Choose foods with less than 5 percent of the Daily Value of sodium. Generally, foods with less than 300 mg of sodium per serving fit into this eating plan.  To find whole grains, look for the word "whole" as the first word in the ingredient list. Shopping  Buy products labeled as "low-sodium" or "no salt added."  Buy fresh foods. Avoid canned foods and premade or frozen meals. Cooking  Avoid adding salt when cooking. Use salt-free seasonings or herbs instead of table salt or sea salt. Check with your health care provider or pharmacist before using salt substitutes.  Do not fry foods. Cook foods using healthy methods such as baking, boiling, grilling, and broiling instead.  Cook with heart-healthy oils, such as olive, canola, soybean, or sunflower oil. Meal planning  Eat a balanced diet that includes: ? 5 or more servings of fruits and vegetables each day. At each meal, try to fill half of your plate with fruits and vegetables. ? Up to 6-8 servings of whole grains each day. ? Less than 6 oz of lean  meat, poultry, or fish each day. A 3-oz serving of meat is about the same size as a deck of cards. One egg equals 1 oz. ? 2 servings of low-fat dairy each day. ? A serving of nuts, seeds, or beans 5 times each week. ? Heart-healthy fats. Healthy fats called Omega-3 fatty acids are found in foods such as flaxseeds and coldwater fish, like sardines, salmon, and mackerel.  Limit how much you eat of the following: ? Canned or prepackaged foods. ? Food that is high in trans fat, such as fried foods. ? Food that is high in saturated fat, such as fatty meat. ? Sweets, desserts, sugary drinks, and other foods with added sugar. ? Full-fat dairy products.  Do not salt foods before eating.  Try to eat at least 2 vegetarian meals each week.  Eat more home-cooked food and less restaurant, buffet, and fast food.  When eating at a restaurant, ask that your food be prepared with less salt or no salt, if possible. What foods are recommended? The items listed may not be a complete list. Talk with your dietitian about what dietary choices are best for you. Grains Whole-grain or whole-wheat bread. Whole-grain or whole-wheat pasta. Brown rice. Orpah Cobb. Bulgur. Whole-grain and low-sodium cereals. Pita bread. Low-fat, low-sodium crackers. Whole-wheat flour tortillas. Vegetables Fresh or frozen vegetables (raw, steamed, roasted, or grilled). Low-sodium or reduced-sodium tomato and vegetable juice. Low-sodium or reduced-sodium tomato sauce and tomato paste. Low-sodium or reduced-sodium canned vegetables. Fruits All fresh, dried, or frozen fruit. Canned fruit in natural juice (without added sugar). Meat and  other protein foods Skinless chicken or [place]. Ground chicken or [place]. Pork with fat trimmed off. Fish and seafood. Egg whites. Dried beans, peas, or lentils. Unsalted nuts, nut butters, and seeds. Unsalted canned beans. Lean cuts of beef with fat trimmed off. Low-sodium, lean deli  meat. Dairy Low-fat (1%) or fat-free (skim) milk. Fat-free, low-fat, or reduced-fat cheeses. Nonfat, low-sodium ricotta or cottage cheese. Low-fat or nonfat yogurt. Low-fat, low-sodium cheese. Fats and oils Soft margarine without trans fats. Vegetable oil. Low-fat, reduced-fat, or light mayonnaise and salad dressings (reduced-sodium). Canola, safflower, olive, soybean, and sunflower oils. Avocado. Seasoning and other foods Herbs. Spices. Seasoning mixes without salt. Unsalted popcorn and pretzels. Fat-free sweets. What foods are not recommended? The items listed may not be a complete list. Talk with your dietitian about what dietary choices are best for you. Grains Baked goods made with fat, such as croissants, muffins, or some breads. Dry pasta or rice meal packs. Vegetables Creamed or fried vegetables. Vegetables in a cheese sauce. Regular canned vegetables (not low-sodium or reduced-sodium). Regular canned tomato sauce and paste (not low-sodium or reduced-sodium). Regular tomato and vegetable juice (not low-sodium or reduced-sodium). Rosita Fire. Olives. Fruits Canned fruit in a light or heavy syrup. Fried fruit. Fruit in cream or butter sauce. Meat and other protein foods Fatty cuts of meat. Ribs. Fried meat. Tomasa Blase. Sausage. Bologna and other processed lunch meats. Salami. Fatback. Hotdogs. Bratwurst. Salted nuts and seeds. Canned beans with added salt. Canned or smoked fish. Whole eggs or egg yolks. Chicken or [place] with skin. Dairy Whole or 2% milk, cream, and half-and-half. Whole or full-fat cream cheese. Whole-fat or sweetened yogurt. Full-fat cheese. Nondairy creamers. Whipped toppings. Processed cheese and cheese spreads. Fats and oils Butter. Stick margarine. Lard. Shortening. Ghee. Bacon fat. Tropical oils, such as coconut, palm kernel, or palm oil. Seasoning and other foods Salted popcorn and pretzels. Onion salt, garlic salt, seasoned salt, table salt, and sea salt. Worcestershire  sauce. Tartar sauce. Barbecue sauce. Teriyaki sauce. Soy sauce, including reduced-sodium. Steak sauce. Canned and packaged gravies. Fish sauce. Oyster sauce. Cocktail sauce. Horseradish that you find on the shelf. Ketchup. Mustard. Meat flavorings and tenderizers. Bouillon cubes. Hot sauce and Tabasco sauce. Premade or packaged marinades. Premade or packaged taco seasonings. Relishes. Regular salad dressings. Where to find more information:  National Heart, Lung, and Blood Institute: PopSteam.is  American Heart Association: www.heart.org Summary  The DASH eating plan is a healthy eating plan that has been shown to reduce high blood pressure (hypertension). It may also reduce your risk for type 2 diabetes, heart disease, and stroke.  With the DASH eating plan, you should limit salt (sodium) intake to 2,300 mg a day. If you have hypertension, you may need to reduce your sodium intake to 1,500 mg a day.  When on the DASH eating plan, aim to eat more fresh fruits and vegetables, whole grains, lean proteins, low-fat dairy, and heart-healthy fats.  Work with your health care provider or diet and nutrition specialist (dietitian) to adjust your eating plan to your individual calorie needs. This information is not intended to replace advice given to you by your health care provider. Make sure you discuss any questions you have with your health care provider. Document Released: 11/10/2011 Document Revised: 11/14/2016 Document Reviewed: 11/14/2016 Elsevier Interactive Patient Education  [address].     Signed, Berton Bon, NP  02/07/2019 5:22 PM    [facility]

## 2019-02-07 NOTE — Patient Instructions (Addendum)
Medication Instructions:  Your physician recommends that you continue on your current medications as directed. Please refer to the Current Medication list given to you today.  If you need a refill on your cardiac medications before your next appointment, please call your pharmacy.   Lab work: None  If you have labs (blood work) drawn today and your tests are completely normal, you will receive your results only by: . MyChart Message (if you have MyChart) OR . A paper copy in the mail If you have any lab test that is abnormal or we need to change your treatment, we will call you to review the results.  Testing/Procedures: None  Follow-Up: At [facility], you and your health needs are our priority.  As part of our continuing mission to provide you with exceptional heart care, we have created designated Provider Care Teams.  These Care Teams include your primary Cardiologist (physician) and Advanced Practice Providers (APPs -  Physician Assistants and Nurse Practitioners) who all work together to provide you with the care you need, when you need it. You will need a follow up appointment in:  6 months.  Please call our office 2 months in advance to schedule this appointment.  You may see Philip Nahser, MD or one of the following Advanced Practice Providers on your designated Care Team: Scott Weaver, PA-C Vin Bhagat, PA-C . Kaylia Winborne, NP  Any Other Special Instructions Will Be Listed Below (If Applicable).   DASH Eating Plan DASH stands for "Dietary Approaches to Stop Hypertension." The DASH eating plan is a healthy eating plan that has been shown to reduce high blood pressure (hypertension). It may also reduce your risk for type 2 diabetes, heart disease, and stroke. The DASH eating plan may also help with weight loss. What are tips for following this plan?  General guidelines  Avoid eating more than 2,300 mg (milligrams) of salt (sodium) a day. If you have hypertension, you may  need to reduce your sodium intake to 1,500 mg a day.  Limit alcohol intake to no more than 1 drink a day for nonpregnant women and 2 drinks a day for men. One drink equals 12 oz of beer, 5 oz of wine, or 1 oz of hard liquor.  Work with your health care provider to maintain a healthy body weight or to lose weight. Ask what an ideal weight is for you.  Get at least 30 minutes of exercise that causes your heart to beat faster (aerobic exercise) most days of the week. Activities may include walking, swimming, or biking.  Work with your health care provider or diet and nutrition specialist (dietitian) to adjust your eating plan to your individual calorie needs. Reading food labels   Check food labels for the amount of sodium per serving. Choose foods with less than 5 percent of the Daily Value of sodium. Generally, foods with less than 300 mg of sodium per serving fit into this eating plan.  To find whole grains, look for the word "whole" as the first word in the ingredient list. Shopping  Buy products labeled as "low-sodium" or "no salt added."  Buy fresh foods. Avoid canned foods and premade or frozen meals. Cooking  Avoid adding salt when cooking. Use salt-free seasonings or herbs instead of table salt or sea salt. Check with your health care provider or pharmacist before using salt substitutes.  Do not fry foods. Cook foods using healthy methods such as baking, boiling, grilling, and broiling instead.  Cook with heart-healthy   oils, such as olive, canola, soybean, or sunflower oil. Meal planning  Eat a balanced diet that includes: ? 5 or more servings of fruits and vegetables each day. At each meal, try to fill half of your plate with fruits and vegetables. ? Up to 6-8 servings of whole grains each day. ? Less than 6 oz of lean meat, poultry, or fish each day. A 3-oz serving of meat is about the same size as a deck of cards. One egg equals 1 oz. ? 2 servings of low-fat dairy each day.  ? A serving of nuts, seeds, or beans 5 times each week. ? Heart-healthy fats. Healthy fats called Omega-3 fatty acids are found in foods such as flaxseeds and coldwater fish, like sardines, salmon, and mackerel.  Limit how much you eat of the following: ? Canned or prepackaged foods. ? Food that is high in trans fat, such as fried foods. ? Food that is high in saturated fat, such as fatty meat. ? Sweets, desserts, sugary drinks, and other foods with added sugar. ? Full-fat dairy products.  Do not salt foods before eating.  Try to eat at least 2 vegetarian meals each week.  Eat more home-cooked food and less restaurant, buffet, and fast food.  When eating at a restaurant, ask that your food be prepared with less salt or no salt, if possible. What foods are recommended? The items listed may not be a complete list. Talk with your dietitian about what dietary choices are best for you. Grains Whole-grain or whole-wheat bread. Whole-grain or whole-wheat pasta. Brown rice. Oatmeal. Quinoa. Bulgur. Whole-grain and low-sodium cereals. Pita bread. Low-fat, low-sodium crackers. Whole-wheat flour tortillas. Vegetables Fresh or frozen vegetables (raw, steamed, roasted, or grilled). Low-sodium or reduced-sodium tomato and vegetable juice. Low-sodium or reduced-sodium tomato sauce and tomato paste. Low-sodium or reduced-sodium canned vegetables. Fruits All fresh, dried, or frozen fruit. Canned fruit in natural juice (without added sugar). Meat and other protein foods Skinless chicken or turkey. Ground chicken or turkey. Pork with fat trimmed off. Fish and seafood. Egg whites. Dried beans, peas, or lentils. Unsalted nuts, nut butters, and seeds. Unsalted canned beans. Lean cuts of beef with fat trimmed off. Low-sodium, lean deli meat. Dairy Low-fat (1%) or fat-free (skim) milk. Fat-free, low-fat, or reduced-fat cheeses. Nonfat, low-sodium ricotta or cottage cheese. Low-fat or nonfat yogurt. Low-fat,  low-sodium cheese. Fats and oils Soft margarine without trans fats. Vegetable oil. Low-fat, reduced-fat, or light mayonnaise and salad dressings (reduced-sodium). Canola, safflower, olive, soybean, and sunflower oils. Avocado. Seasoning and other foods Herbs. Spices. Seasoning mixes without salt. Unsalted popcorn and pretzels. Fat-free sweets. What foods are not recommended? The items listed may not be a complete list. Talk with your dietitian about what dietary choices are best for you. Grains Baked goods made with fat, such as croissants, muffins, or some breads. Dry pasta or rice meal packs. Vegetables Creamed or fried vegetables. Vegetables in a cheese sauce. Regular canned vegetables (not low-sodium or reduced-sodium). Regular canned tomato sauce and paste (not low-sodium or reduced-sodium). Regular tomato and vegetable juice (not low-sodium or reduced-sodium). Pickles. Olives. Fruits Canned fruit in a light or heavy syrup. Fried fruit. Fruit in cream or butter sauce. Meat and other protein foods Fatty cuts of meat. Ribs. Fried meat. Bacon. Sausage. Bologna and other processed lunch meats. Salami. Fatback. Hotdogs. Bratwurst. Salted nuts and seeds. Canned beans with added salt. Canned or smoked fish. Whole eggs or egg yolks. Chicken or turkey with skin. Dairy Whole or 2% milk,   cream, and half-and-half. Whole or full-fat cream cheese. Whole-fat or sweetened yogurt. Full-fat cheese. Nondairy creamers. Whipped toppings. Processed cheese and cheese spreads. Fats and oils Butter. Stick margarine. Lard. Shortening. Ghee. Bacon fat. Tropical oils, such as coconut, palm kernel, or palm oil. Seasoning and other foods Salted popcorn and pretzels. Onion salt, garlic salt, seasoned salt, table salt, and sea salt. Worcestershire sauce. Tartar sauce. Barbecue sauce. Teriyaki sauce. Soy sauce, including reduced-sodium. Steak sauce. Canned and packaged gravies. Fish sauce. Oyster sauce. Cocktail sauce.  Horseradish that you find on the shelf. Ketchup. Mustard. Meat flavorings and tenderizers. Bouillon cubes. Hot sauce and Tabasco sauce. Premade or packaged marinades. Premade or packaged taco seasonings. Relishes. Regular salad dressings. Where to find more information:  National Heart, Lung, and Blood Institute: www.nhlbi.nih.gov  American Heart Association: www.heart.org Summary  The DASH eating plan is a healthy eating plan that has been shown to reduce high blood pressure (hypertension). It may also reduce your risk for type 2 diabetes, heart disease, and stroke.  With the DASH eating plan, you should limit salt (sodium) intake to 2,300 mg a day. If you have hypertension, you may need to reduce your sodium intake to 1,500 mg a day.  When on the DASH eating plan, aim to eat more fresh fruits and vegetables, whole grains, lean proteins, low-fat dairy, and heart-healthy fats.  Work with your health care provider or diet and nutrition specialist (dietitian) to adjust your eating plan to your individual calorie needs. This information is not intended to replace advice given to you by your health care provider. Make sure you discuss any questions you have with your health care provider. Document Released: 11/10/2011 Document Revised: 11/14/2016 Document Reviewed: 11/14/2016 Elsevier Interactive Patient Education  2019 Elsevier Inc.  

## 2019-02-09 ENCOUNTER — Emergency Department (HOSPITAL_BASED_OUTPATIENT_CLINIC_OR_DEPARTMENT_OTHER)
Admission: EM | Admit: 2019-02-09 | Discharge: 2019-02-09 | Disposition: A | Discharge status: Home / Self Care | Payer: Managed Care, Other (non HMO) | Attending: Emergency Medicine | Admitting: Emergency Medicine

## 2019-02-09 ENCOUNTER — Encounter (HOSPITAL_BASED_OUTPATIENT_CLINIC_OR_DEPARTMENT_OTHER): Payer: Self-pay | Admitting: *Deleted

## 2019-02-09 ENCOUNTER — Other Ambulatory Visit: Payer: Self-pay

## 2019-02-09 ENCOUNTER — Emergency Department (HOSPITAL_BASED_OUTPATIENT_CLINIC_OR_DEPARTMENT_OTHER): Payer: Managed Care, Other (non HMO)

## 2019-02-09 DIAGNOSIS — R110 Nausea: Secondary | ICD-10-CM | POA: Diagnosis present

## 2019-02-09 DIAGNOSIS — I10 Essential (primary) hypertension: Secondary | ICD-10-CM | POA: Insufficient documentation

## 2019-02-09 DIAGNOSIS — K219 Gastro-esophageal reflux disease without esophagitis: Secondary | ICD-10-CM | POA: Insufficient documentation

## 2019-02-09 DIAGNOSIS — Z79899 Other long term (current) drug therapy: Secondary | ICD-10-CM | POA: Insufficient documentation

## 2019-02-09 MED ORDER — ALUM & MAG HYDROXIDE-SIMETH 200-200-20 MG/5ML PO SUSP
30.0000 mL | Freq: Once | ORAL | Status: AC
  Administered 2019-02-09: 30 mL via ORAL
  Filled 2019-02-09: qty 30

## 2019-02-09 MED ORDER — ONDANSETRON 4 MG PO TBDP
4.0000 mg | ORAL_TABLET | Freq: Once | ORAL | Status: AC
  Administered 2019-02-09: 4 mg via ORAL
  Filled 2019-02-09: qty 1

## 2019-02-09 MED ORDER — LIDOCAINE VISCOUS HCL 2 % MT SOLN
15.0000 mL | Freq: Once | OROMUCOSAL | Status: AC
  Administered 2019-02-09: 15 mL via ORAL
  Filled 2019-02-09: qty 15

## 2019-02-09 MED ORDER — SUCRALFATE 1 GM/10ML PO SUSP: 1.0000 g | Freq: Three times a day (TID) | ORAL | 0 refills | Status: DC

## 2019-02-09 MED ORDER — PANTOPRAZOLE SODIUM 40 MG PO TBEC: 40.0000 mg | DELAYED_RELEASE_TABLET | Freq: Every day | ORAL | 0 refills | Status: DC

## 2019-02-09 NOTE — ED Triage Notes (Signed)
Pt reports he swallowed chips 3 weeks ago and throat was cut. He was put on meds for acid reflux (finished 1 weeks ago) and something to coat his throat. States Sx had improved until he drank a pepsi last and his throat began burning. States he has had constant nausea since then. Last night in bed he heart was "beating real fast". States "it feels likes like there is something in my throat"

## 2019-02-09 NOTE — ED Provider Notes (Signed)
[facility] EMERGENCY DEPARTMENT Provider Note   CSN: [id number] Arrival date & time: 02/09/19  1536    History   Chief Complaint Chief Complaint  Patient presents with  . Nausea    HPI Robert Cruz is a 43 y.o. male possible history of GERD, hypertension who presents for evaluation nausea, throat irritation.  Patient states that approximate 2 weeks ago, he was eating some potato chips and he felt like it scratched his throat.  He saw his PCP who prescribed him Protonix and Carafate.  Patient reports he has stopped taking them.  Patient reports that last night, he drank a Pepsi which worsened his pain.  Additionally, he has had some nausea.  Patient states he has not had any vomiting and has been able to tolerate p.o.  He states he is not having any difficulty tolerating secretions.  Patient states that last night, he was having some discomfort in his throat area that was worse when he tried to lay down flat.  Patient states that he has not eaten today because he is afraid it will cause the pain to occur again.  Patient denies any fevers, chest pain, difficulty breathing, vomiting.     The history is provided by the patient.    Past Medical History:  Diagnosis Date  . Chicken pox   . GERD (gastroesophageal reflux disease)   . Hypertension   . Morbid obesity (HCC)   . Sleep apnea     Patient Active Problem List   Diagnosis Date Noted  . Mixed hyperlipidemia 09/08/2017  . Allergic rhinitis 10/11/2016  . Sore throat 10/11/2016  . Chronic migraine without aura without status migrainosus, not intractable 01/14/2016  . 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 Generalized headache 12/16/2015  . Encounter for preventive health examination 09/06/2013  . GERD (gastroesophageal reflux disease) 09/06/2013  . DYSPHAGIA UNSPECIFIED 02/08/2011  . OSA (obstructive sleep apnea) 12/28/2008  . Severe obesity (BMI >= 40) (HCC) 10/07/2008  . Essential hypertension 08/07/2007     History reviewed. No pertinent surgical history.      Home Medications    Prior to Admission medications   Medication Sig Start Date End Date Taking? Authorizing Provider  atenolol (TENORMIN) 100 MG tablet Take 1 tablet (100 mg total) by mouth daily. 02/07/19  Yes Berton Bon, NP  hydrochlorothiazide (HYDRODIURIL) 25 MG tablet Take 1 tablet (25 mg total) by mouth daily. 02/07/19  Yes Berton Bon, NP  lisinopril (PRINIVIL,ZESTRIL) 20 MG tablet Take 1 tablet (20 mg total) by mouth daily. 02/07/19  Yes Berton Bon, NP  spironolactone (ALDACTONE) 100 MG tablet Take 1 and a half tablets (150 mg) once daily 02/07/19  Yes Berton Bon, NP  pantoprazole (PROTONIX) 40 MG tablet Take 1 tablet (40 mg total) by mouth daily for 30 days. 02/09/19 03/11/19  Maxwell Caul, PA-C  sucralfate (CARAFATE) 1 GM/10ML suspension Take 10 mLs (1 g total) by mouth 4 (four) times daily -  with meals and at bedtime. 02/09/19   Maxwell Caul, PA-C    Family History Family History  Problem Relation Age of Onset  . Hypertension Father   . Depression Father   . Hypertension Mother   . Kidney disease Maternal Grandmother        Dialysis  . Hypertension Maternal Grandmother   . Healthy Brother        x1  . Healthy Sister        x3  . Colon cancer Neg Hx   . Esophageal  cancer Neg Hx   . Rectal cancer Neg Hx   . Stomach cancer Neg Hx     Social History Social History   Tobacco Use  . Smoking status: Never Smoker  . Smokeless tobacco: Never Used  Substance Use Topics  . Alcohol use: No  . Drug use: No     Allergies   Amlodipine and Bystolic [nebivolol hcl]   Review of Systems Review of Systems  Constitutional: Negative for fever.  HENT: Negative for drooling and trouble swallowing.        Throat irritation  Respiratory: Negative for shortness of breath.   Cardiovascular: Negative for chest pain.  Gastrointestinal: Positive for nausea. Negative for abdominal pain and vomiting.   All other systems reviewed and are negative.    Physical Exam Updated Vital Signs BP 120/77 (BP Location: Left Arm)   Pulse 78   Temp 98.9 F (37.2 C) (Oral)   Resp 18   Ht  (1.854 m)   Wt (!) 149.7 kg   SpO2 100%   BMI 43.54 kg/m   Physical Exam Vitals signs and nursing note reviewed.  Constitutional:      Appearance: Normal appearance. He is well-developed.  HENT:     Head: Normocephalic and atraumatic.  Eyes:     General: Lids are normal.     Conjunctiva/sclera: Conjunctivae normal.     Pupils: Pupils are equal, round, and reactive to light.  Neck:     Musculoskeletal: Full passive range of motion without pain. No edema or crepitus.     Comments: Full flexion/extension and lateral movement of neck fully intact. No bony midline tenderness. No deformities or crepitus.  No edema noted. Cardiovascular:     Rate and Rhythm: Normal rate and regular rhythm.     Pulses: Normal pulses.     Heart sounds: Normal heart sounds. No murmur. No friction rub. No gallop.   Pulmonary:     Effort: Pulmonary effort is normal.     Breath sounds: Normal breath sounds.     Comments: Lungs clear to auscultation bilaterally.  Symmetric chest rise.  No wheezing, rales, rhonchi. Abdominal:     Palpations: Abdomen is soft. Abdomen is not rigid.     Tenderness: There is no abdominal tenderness. There is no guarding.     Comments: Abdomen is soft, non-distended, non-tender. No rigidity, No guarding. No peritoneal signs.  Musculoskeletal: Normal range of motion.  Skin:    General: Skin is warm and dry.     Capillary Refill: Capillary refill takes less than 2 seconds.  Neurological:     Mental Status: He is alert and oriented to person, place, and time.  Psychiatric:        Speech: Speech normal.      ED Treatments / Results  Labs (all labs ordered are listed, but only abnormal results are displayed) Labs Reviewed - No data to display  EKG None  Radiology Dg Neck Soft  Tissue  Result Date: 02/09/2019 CLINICAL DATA:  Foreign body sensation in throat. EXAM: NECK SOFT TISSUES - 1+ VIEW COMPARISON:  None. FINDINGS: There is no evidence of retropharyngeal soft tissue swelling or epiglottic enlargement. The cervical airway is unremarkable and no radio-opaque foreign body identified. IMPRESSION: Negative. Electronically Signed   By: Charlett Nose M.D.   On: 02/09/2019 17:42    Procedures Procedures (including critical care time)  Medications Ordered in ED Medications  alum & mag hydroxide-simeth (MAALOX/MYLANTA) 200-200-20 MG/5ML suspension 30 mL (30 mLs Oral  Given 02/09/19 1759)    And  lidocaine (XYLOCAINE) 2 % viscous mouth solution 15 mL (15 mLs Oral Given 02/09/19 1759)  ondansetron (ZOFRAN-ODT) disintegrating tablet 4 mg (4 mg Oral Given 02/09/19 1730)     Initial Impression / Assessment and Plan / ED Course  I have reviewed the triage vital signs and the nursing notes.  Pertinent labs & imaging results that were available during my care of the patient were reviewed by me and considered in my medical decision making (see chart for details).        43 y.o. M presents for evaluation of nausea, throat irritation.  Reports about 2 weeks ago, he was eating chips when he irritated his throat.  Saw PCP who prescribed Protonix and Carafate which he has stopped taking.  Reports last night drink a Pepsi which made sensation worse.  He also feels nausea.  He states he had some burning sensation and pain in his throat, especially when he laid down.  He is able to tolerate secretions and p.o.  No chest pain, difficulty breathing. Patient is afebrile, non-toxic appearing, sitting comfortably on examination table. Vital signs reviewed and stable.  No crepitus noted on exam that would be concerning for esophageal perforation.  He is able to tolerate secretions without any difficulty and does not have any drooling.  Doubt food impaction.  Worsening GERD versus esophageal  irritation.  Will plan for GI cocktail. Patient denies any CP. He did mention last night he had palpitations to the nurse at triage. Patient states that he never had any CP. He states he got anxious becuae of the sensation he was feeling. Do not suspect cardiac etiology as the source ofpatients symnptoms.   X-ray of neck shows no evidence of foreign body.  No evidence of soft tissue swelling.  Discussed results with patient.  He reports feeling somewhat better after GI cocktail and Zofran.  Patient is tolerating secretions without any difficulty.  He has been able to tolerate p.o. without any difficulty.  Vital signs are stable.  Patient states he is ready to go home.  I encouraged him to continue taking Carafate and Protonix.  Will give GI referral if he does not have any improvement in symptoms. At this time, patient exhibits no emergent life-threatening condition that require further evaluation in ED or admission. Patient had ample opportunity for questions and discussion. All patient's questions were answered with full understanding. Strict return precautions discussed. Patient expresses understanding and agreement to plan.   Portions of this note were generated with Scientist, clinical (histocompatibility and immunogenetics). Dictation errors may occur despite best attempts at proofreading.   Final Clinical Impressions(s) / ED Diagnoses   Final diagnoses:  Nausea  Gastroesophageal reflux disease, esophagitis presence not specified    ED Discharge Orders         Ordered    pantoprazole (PROTONIX) 40 MG tablet  Daily     02/09/19 1832    sucralfate (CARAFATE) 1 GM/10ML suspension  3 times daily with meals & bedtime     02/09/19 1832           Maxwell Caul, PA-C 02/09/19 2241    Vanetta Mulders, MD 02/10/19 [phone]

## 2019-02-09 NOTE — Discharge Instructions (Signed)
As we discussed, take Protonix and Carafate.  Follow-up with your primary care doctor.  If symptoms do not improve, you can follow-up with referred GI doctor for further evaluation.  Return the emergency department for any fever, difficulty breathing, chest pain, persistent vomiting or any other worsening or concerning symptoms.

## 2019-02-09 NOTE — ED Notes (Signed)
Pt declined DC vitals. Pt ambulatory to DC in no distress. Patient verbalizes understanding of discharge instructions. Opportunity for questioning and answers were provided. Armband removed by staff, pt discharged from ED

## 2019-03-28 ENCOUNTER — Telehealth: Payer: Self-pay

## 2019-03-28 ENCOUNTER — Other Ambulatory Visit: Payer: Self-pay | Admitting: Internal Medicine

## 2019-03-28 DIAGNOSIS — K2100 Gastro-esophageal reflux disease with esophagitis, without bleeding: Secondary | ICD-10-CM

## 2019-03-28 DIAGNOSIS — K219 Gastro-esophageal reflux disease without esophagitis: Secondary | ICD-10-CM

## 2019-03-28 MED ORDER — DEXLANSOPRAZOLE 60 MG PO CPDR: 60.0000 mg | DELAYED_RELEASE_CAPSULE | Freq: Every day | ORAL | 1 refills | Status: DC

## 2019-03-28 MED ORDER — ALUM HYDROXIDE-MAG CARBONATE 95-358 MG/15ML PO SUSP: 15.0000 mL | Freq: Three times a day (TID) | ORAL | 1 refills | Status: DC

## 2019-03-28 NOTE — Telephone Encounter (Signed)
Copied from CRM [id number]. Topic: General - Inquiry >> Mar 28, 2019  2:37 PM [name] wrote: Reason for CRM: Pt states he is on a medication for acid reflux already, and that PCP has already increased the amount he is taking. Pt states his acid reflux is still continuing to worsen. Pt would like to speak to the nurse or get a recommendation on what would be best option for him next.

## 2019-03-28 NOTE — Telephone Encounter (Signed)
Can de describe his symptoms?

## 2019-03-28 NOTE — Telephone Encounter (Signed)
Discontinue Protonix and upgrade to Dexilant once a day in the morning before he eats breakfast. I sent this prescription to his pharmacy.  Also, take Gaviscon as needed after meals. I have also sent this to his [place] pharmacy.

## 2019-03-28 NOTE — Telephone Encounter (Signed)
Spoke with patient and info given 

## 2019-03-31 ENCOUNTER — Ambulatory Visit (HOSPITAL_COMMUNITY)
Admission: EM | Admit: 2019-03-31 | Discharge: 2019-03-31 | Disposition: A | Discharge status: Home / Self Care | Payer: Managed Care, Other (non HMO) | Attending: Family Medicine | Admitting: Family Medicine

## 2019-03-31 ENCOUNTER — Encounter (HOSPITAL_COMMUNITY): Payer: Self-pay

## 2019-03-31 ENCOUNTER — Other Ambulatory Visit: Payer: Self-pay

## 2019-03-31 DIAGNOSIS — K2100 Gastro-esophageal reflux disease with esophagitis, without bleeding: Secondary | ICD-10-CM

## 2019-03-31 NOTE — Discharge Instructions (Addendum)
You need to take a PPI every day.  For now take Protonix 2 times a day.  When you're heartburn gets better then you can go down to 1 a day.  Your goal is to take the minimal dose to control your symptoms.  You probably should not go off of this medicine You may take in addition ant- acids and as often as necessary You could consider adding an H2 blocker like Pepcid or Tagamet Be careful with your diet. Avoid over-the-counter medicines like ibuprofen that cause heartburn Read information provided Elevate head of bed Follow-up with your primary care doctor I hope this helps you!

## 2019-03-31 NOTE — ED Triage Notes (Signed)
C/o throat burning from reflux, states some heartburn at times, was recently prescribed dexilant  Without relief

## 2019-03-31 NOTE — ED Notes (Signed)
Patient verbalizes understanding of discharge instructions. Opportunity for questioning and answers were provided. Patient discharged from [facility] by RN.  

## 2019-03-31 NOTE — ED Provider Notes (Addendum)
[facility]    CSN: [id number] Arrival date & time: 03/31/19  1736     History   Chief Complaint Chief Complaint  Patient presents with  . Gastroesophageal Reflux    HPI Robert Cruz is a 43 y.o. male.   HPI  Patient has had longstanding GERD.  He has been under medical care for many years.  He has been tried on multiple medications including omeprazole, Protonix, AcipHex, Dexilant, Pepcid, Carafate, and antacids.  His last GERD evaluation and endoscopy was 5 years ago.  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  No Barrett's esophagus. He states he made a mistake.  He was feeling good so he stopped his Protonix.  He immediately developed severe heartburn.  He has been trying to get back under control for the last couple of days.  He called his doctor on 03/28/2019 and was told to switch to Dexilant.  I think they believed he was still on his Protonix.  He tells me that actually the Protonix worked better.  He is here for advice asking how to get rid of the acid overload and heartburn symptom. He does not have any chest pain or pressure, no exertional symptoms, no radiation, no nausea or vomiting, no shortness of breath Chart reviewed.  Endoscopy reviewed.  Pathology report from endoscopy reviewed. Past Medical History:  Diagnosis Date  . Chicken pox   . GERD (gastroesophageal reflux disease)   . Hypertension   . Morbid obesity (HCC)   . Sleep apnea     Patient Active Problem List   Diagnosis Date Noted  . Mixed hyperlipidemia 09/08/2017  . Allergic rhinitis 10/11/2016  . Sore throat 10/11/2016  . Chronic migraine without aura without status migrainosus, not intractable 01/14/2016  . 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 Generalized headache 12/16/2015  . Encounter for preventive health examination 09/06/2013  . GERD (gastroesophageal reflux disease) 09/06/2013  . DYSPHAGIA UNSPECIFIED 02/08/2011  . OSA (obstructive sleep apnea) 12/28/2008  . Severe obesity (BMI  >= 40) (HCC) 10/07/2008  . Essential hypertension 08/07/2007    History reviewed. No pertinent surgical history.     Home Medications    Prior to Admission medications   Medication Sig Start Date End Date Taking? Authorizing Provider  aluminum hydroxide-magnesium carbonate (GAVISCON) 95-358 MG/15ML SUSP Take 15 mLs by mouth 3 (three) times daily after meals. 03/28/19   Etta Grandchild, MD  atenolol (TENORMIN) 100 MG tablet Take 1 tablet (100 mg total) by mouth daily. 02/07/19   Berton Bon, NP  hydrochlorothiazide (HYDRODIURIL) 25 MG tablet Take 1 tablet (25 mg total) by mouth daily. 02/07/19   Berton Bon, NP  lisinopril (PRINIVIL,ZESTRIL) 20 MG tablet Take 1 tablet (20 mg total) by mouth daily. 02/07/19   Berton Bon, NP  spironolactone (ALDACTONE) 100 MG tablet Take 1 and a half tablets (150 mg) once daily 02/07/19   Berton Bon, NP  sucralfate (CARAFATE) 1 GM/10ML suspension Take 10 mLs (1 g total) by mouth 4 (four) times daily -  with meals and at bedtime. 02/09/19   Maxwell Caul, PA-C    Family History Family History  Problem Relation Age of Onset  . Hypertension Father   . Depression Father   . Hypertension Mother   . Kidney disease Maternal Grandmother        Dialysis  . Hypertension Maternal Grandmother   . Healthy Brother        x1  . Healthy Sister  x3  . Colon cancer Neg Hx   . Esophageal cancer Neg Hx   . Rectal cancer Neg Hx   . Stomach cancer Neg Hx     Social History Social History   Tobacco Use  . Smoking status: Never Smoker  . Smokeless tobacco: Never Used  Substance Use Topics  . Alcohol use: No  . Drug use: No     Allergies   Amlodipine and Bystolic [nebivolol hcl]   Review of Systems Review of Systems  Constitutional: Negative for chills and fever.  HENT: Negative for ear pain and sore throat.   Eyes: Negative for pain and visual disturbance.  Respiratory: Negative for cough and shortness of breath.   Cardiovascular:  Negative for chest pain and palpitations.  Gastrointestinal: Positive for abdominal pain. Negative for vomiting.  Genitourinary: Negative for dysuria and hematuria.  Musculoskeletal: Negative for arthralgias and back pain.  Skin: Negative for color change and rash.  Neurological: Negative for seizures and syncope.  All other systems reviewed and are negative.    Physical Exam Triage Vital Signs ED Triage Vitals  Enc Vitals Group     BP --      Pulse Rate 03/31/19 1753 79     Resp 03/31/19 1753 (!) 22     Temp --      Temp src --      SpO2 03/31/19 1753 97 %     Weight --      Height --      Head Circumference --      Peak Flow --      Pain Score 03/31/19 1755 7     Pain Loc --      Pain Edu? --      Excl. in GC? --    No data found.  Updated Vital Signs Pulse 79   Resp (!) 22   SpO2 97%   Visual Acuity Right Eye Distance:   Left Eye Distance:   Bilateral Distance:    Right Eye Near:   Left Eye Near:    Bilateral Near:     Physical Exam Constitutional:      General: He is not in acute distress.    Appearance: He is well-developed. He is obese.  HENT:     Head: Normocephalic and atraumatic.  Eyes:     Conjunctiva/sclera: Conjunctivae normal.     Pupils: Pupils are equal, round, and reactive to light.  Neck:     Musculoskeletal: Normal range of motion.  Cardiovascular:     Rate and Rhythm: Normal rate and regular rhythm.     Heart sounds: Normal heart sounds.  Pulmonary:     Effort: Pulmonary effort is normal. No respiratory distress.     Breath sounds: Normal breath sounds.  Abdominal:     General: There is no distension.     Palpations: Abdomen is soft.     Tenderness: There is abdominal tenderness.     Comments: Very mild tenderness deep in the epigastrium  Musculoskeletal: Normal range of motion.  Skin:    General: Skin is warm and dry.  Neurological:     General: No focal deficit present.     Mental Status: He is alert.  Psychiatric:         Mood and Affect: Mood normal.      UC Treatments / Results  Labs (all labs ordered are listed, but only abnormal results are displayed) Labs Reviewed - No data to display  EKG None  Radiology No results found.  Procedures Procedures (including critical care time)  Medications Ordered in UC Medications - No data to display  Initial Impression / Assessment and Plan / UC Course  I have reviewed the triage vital signs and the nursing notes.  Pertinent labs & imaging results that were available during my care of the patient were reviewed by me and considered in my medical decision making (see chart for details).     The patient needs to have regular follow-ups with his PCP.  He needs to stay on a PPI.  With his history of esophagitis and stricture, he likely should take it long-term.  He should not stop medication list discussed with his physician.  I reviewed with him that he needs to go back on the Protonix twice a day.  Once his GERD is under control he can reduce this to once a day.  He may go down to every other day eventually, but should not stop. We discussed PPIs.  We discussed H2 blockers.  We discussed oral anti-acids.  We discussed foods, alcohol, and over-the-counter medications Final Clinical Impressions(s) / UC Diagnoses   Final diagnoses:  Gastroesophageal reflux disease with esophagitis     Discharge Instructions     You need to take a PPI every day.  For now take Protonix 2 times a day.  When you're heartburn gets better then you can go down to 1 a day.  Your goal is to take the minimal dose to control your symptoms.  You probably should not go off of this medicine You may take in addition ant- acids and as often as necessary You could consider adding an H2 blocker like Pepcid or Tagamet Be careful with your diet. Avoid over-the-counter medicines like ibuprofen that cause heartburn Read information provided Elevate head of bed Follow-up with your primary  care doctor I hope this helps you!   ED Prescriptions    None     Controlled Substance Prescriptions [place] Controlled Substance Registry consulted? Not Applicable   Eustace MooreNelson, Kristine Tiley Sue, MD 03/31/19 1817    Eustace MooreNelson, Kaydyn Chism Sue, MD 03/31/19 [id number]

## 2019-05-13 MED ORDER — PANTOPRAZOLE SODIUM 40 MG PO TBEC: 40.0000 mg | DELAYED_RELEASE_TABLET | Freq: Two times a day (BID) | ORAL | 3 refills | Status: DC

## 2019-05-13 NOTE — Telephone Encounter (Signed)
Pt stated he would like to go back to Protonix. He feels like it worked better for him and he went to a UC that told him he should start taking it again 2 times a day. Please advise.

## 2019-05-13 NOTE — Addendum Note (Signed)
Addended by: Sherlene Shams on: 05/13/2019 10:34 AM   Modules accepted: Orders

## 2019-05-13 NOTE — Telephone Encounter (Signed)
I am fine to put him back on the Protonix twice a day. However, I would like him to see GI in follow-up. I think he needs an endoscopy due to the severity of his reflux symptoms. Referral updated.

## 2019-05-14 NOTE — Telephone Encounter (Signed)
Sent patient mychart message with info.

## 2019-05-20 ENCOUNTER — Telehealth: Payer: Self-pay

## 2019-05-20 NOTE — Telephone Encounter (Signed)
Key: [id number]   Started prior authorization on line today. Currently waiting approval or denial to take twice a day.

## 2019-05-21 NOTE — Telephone Encounter (Signed)
Insurance not approving Protonix bid; he needs to see GI to discuss treatment options to get his GERD treated appropriately.

## 2019-05-29 ENCOUNTER — Ambulatory Visit (INDEPENDENT_AMBULATORY_CARE_PROVIDER_SITE_OTHER): Payer: Managed Care, Other (non HMO) | Admitting: Gastroenterology

## 2019-05-29 ENCOUNTER — Encounter: Payer: Self-pay | Admitting: Gastroenterology

## 2019-05-29 VITALS — Ht 73.0 in | Wt 300.0 lb

## 2019-05-29 DIAGNOSIS — R070 Pain in throat: Secondary | ICD-10-CM | POA: Diagnosis not present

## 2019-05-29 DIAGNOSIS — K219 Gastro-esophageal reflux disease without esophagitis: Secondary | ICD-10-CM | POA: Diagnosis not present

## 2019-05-29 NOTE — Addendum Note (Signed)
Addended by: Elias Else on: 05/29/2019 11:01 AM   Modules accepted: Orders

## 2019-05-29 NOTE — Patient Instructions (Signed)
If you are age 43 or older, your body mass index should be between 23-30. Your Body mass index is 39.58 kg/m. If this is out of the aforementioned range listed, please consider follow up with your Primary Care Provider.  If you are age 73 or younger, your body mass index should be between 19-25. Your Body mass index is 39.58 kg/m. If this is out of the aformentioned range listed, please consider follow up with your Primary Care Provider.   You have been scheduled for an endoscopy. Please follow written instructions given to you at your visit today. If you use inhalers (even only as needed), please bring them with you on the day of your procedure.  It was a pleasure to see you today!  Dr. Loletha Carrow

## 2019-05-29 NOTE — Progress Notes (Signed)
This patient contacted our office requesting a physician telemedicine consultation regarding clinical questions and/or test results.  If new patient, they were referred by PCP noted below  Participants on the conference : myself and patient   The patient consented to this consultation and was aware that a charge will be placed through their insurance.  I was in my office and the patient was parked in his truck   Encounter time:  Total time 45 minutes, with 35 minutes spent with patient on Doximity   _____________________________________________________________________________________________              Robert Cruz, Robert Woodruff, Robert Cruz  Reason for consult/chief complaint: No chief complaint on file.   Subjective  HPI: Seen by Dr. Arlyce DiceKaplan January 2015 for heartburn and dysphagia.  Upper endoscopy revealed irregular Z line, biopsies negative for intestinal metaplasia or eosinophilic esophagitis. 52 French bougie dilation performed, with reported improvement in dysphagia on follow-up visit.  Was in the emergency department early March with about 2 weeks of discomfort after eating potato chips.  But improved after primary care prescribed Protonix and Carafate.  Emergency department April 26 for severe heartburn.  Sounds like he is tried multiple PPIs and H2 blocker and Carafate over the years.  He had reportedly stopped taking his Protonix, then had severe recurrence of heartburn prompting trip to the ED.  Was advised to take Protonix twice daily and follow-up with GI.   [name] has been bothered by frequent regurgitation with a feeling of fullness in throat.  He does not have esophageal dysphagia as such.  He says that improved with changes in eating pattern, particularly smaller portions and not eating late in the evening.  He finds it difficult to eat regularly and to stay active  because [place]  He has intermittent nausea and reflux episodes.    ROS:  Review of Systems  Constitutional: Negative for appetite change and unexpected weight change.  HENT: Negative for mouth sores and voice change.   Eyes: Negative for pain and redness.  Respiratory: Negative for cough and shortness of breath.   Cardiovascular: Negative for chest pain and palpitations.  Genitourinary: Negative for dysuria and hematuria.  Musculoskeletal: Negative for arthralgias and myalgias.  Skin: Negative for pallor and rash.  Neurological: Negative for weakness and headaches.  Hematological: Negative for adenopathy.     Past Medical History: Past Medical History:  Diagnosis Date  . Chicken pox   . GERD (gastroesophageal reflux disease)   . Hypertension   . Morbid obesity (HCC)   . Sleep apnea      Past Surgical History: History reviewed. No pertinent surgical history. No prior surgery  Family History: Family History  Problem Relation Age of Onset  . Hypertension Father   . Depression Father   . Hypertension Mother   . Kidney disease Maternal Grandmother        Dialysis  . Hypertension Maternal Grandmother   . Healthy Brother        x1  . Healthy Sister        x3  . Colon cancer Neg Hx   . Esophageal cancer Neg Hx   . Rectal cancer Neg Hx   . Stomach cancer Neg Hx     Social History: Social History   Socioeconomic History  . Marital status: Significant Other    Spouse name: Not  on file  . Number of children: 5  . Years of education: 6913  . Highest education level: Not on file  Occupational History  . Occupation: Information systems managerMaterial Handler    Employer: [employer]  Social Needs  . Financial resource strain: Not on file  . Food insecurity    Worry: Not on file    Inability: Not on file  . Transportation needs    Medical: Not on file    Non-medical: Not on file  Tobacco Use  . Smoking status: Never Smoker   . Smokeless tobacco: Never Used  Substance and Sexual Activity  . Alcohol use: No  . Drug use: No  . Sexual activity: Not on file  Lifestyle  . Physical activity    Days per week: Not on file    Minutes per session: Not on file  . Stress: Not on file  Relationships  . Social Musicianconnections    Talks on phone: Not on file    Gets together: Not on file    Attends religious service: Not on file    Active member of club or organization: Not on file    Attends meetings of clubs or organizations: Not on file    Relationship status: Not on file  Other Topics Concern  . Not on file  Social History Narrative  . Not on file    Allergies: Allergies  Allergen Reactions  . Amlodipine Other (See Comments)    edema  . Bystolic [Nebivolol Hcl] Other (See Comments)    Patient stated medication gave him major headaches    Outpatient Meds: Current Outpatient Medications  Medication Sig Dispense Refill  . aluminum hydroxide-magnesium carbonate (GAVISCON) 95-358 MG/15ML SUSP Take 15 mLs by mouth 3 (three) times daily after meals. 355 mL 1  . atenolol (TENORMIN) 100 MG tablet Take 1 tablet (100 mg total) by mouth daily. 90 tablet 3  . hydrochlorothiazide (HYDRODIURIL) 25 MG tablet Take 1 tablet (25 mg total) by mouth daily. 90 tablet 3  . lisinopril (PRINIVIL,ZESTRIL) 20 MG tablet Take 1 tablet (20 mg total) by mouth daily. 90 tablet 3  . pantoprazole (PROTONIX) 40 MG tablet Take 1 tablet (40 mg total) by mouth 2 (two) times daily. 60 tablet 3  . spironolactone (ALDACTONE) 100 MG tablet Take 1 and a half tablets (150 mg) once daily 135 tablet 3  . sucralfate (CARAFATE) 1 GM/10ML suspension Take 10 mLs (1 g total) by mouth 4 (four) times daily -  with meals and at bedtime. 420 mL 0   No current facility-administered medications for this visit.       ___________________________________________________________________ Objective   Exam:  No exam -virtual visit He is well-appearing with  normal vocal quality    Labs:  CBC Latest Ref Rng & Units 07/13/2018 10/10/2017 10/19/2015  WBC 4.0 - 10.5 K/uL 7.7 10.2 8.4  Hemoglobin 13.0 - 17.0 g/dL 11.8(L) 11.0(L) 12.8(L)  Hematocrit 39.0 - 52.0 % 35.5(L) 33.4(L) 38.5(L)  Platelets 150.0 - 400.0 K/uL 337.0 336 311   CMP Latest Ref Rng & Units 07/13/2018 10/10/2017 10/10/2017  Glucose 70 - 99 mg/dL 161(W119(H) 960(A123(H) 99  BUN 6 - 23 mg/dL 20 20 17   Creatinine 0.40 - 1.50 mg/dL 5.40(J1.54(H) 8.11(B1.58(H) 1.47(W1.46(H)  Sodium 135 - 145 mEq/L 135 136 138  Potassium 3.5 - 5.1 mEq/L 4.7 4.1 4.8  Chloride 96 - 112 mEq/L 103 106 102  CO2 19 - 32 mEq/L 24 23 20   Calcium 8.4 - 10.5 mg/dL 9.7 9.0 9.2  Total  Protein 6.0 - 8.3 g/dL 7.9 - -  Total Bilirubin 0.2 - 1.2 mg/dL 0.4 - -  Alkaline Phos 39 - 117 U/L 62 - -  AST 0 - 37 U/L 16 - -  ALT 0 - 53 U/L 14 - -     Assessment: Encounter Diagnoses  Name Primary?  . Gastroesophageal reflux disease without esophagitis Yes  . Throat discomfort   . Severe obesity (BMI >= 40) (HCC)     Years of reflux symptoms that persist despite high-dose acid suppression.  He is running out of his Protonix, having been taking it twice daily lately.  He says his insurance would not cover it twice a day.  Until it can be refilled, and then also for twice daily supplement thereafter, he will have to use OTC omeprazole or lansoprazole 2 tablets for the evening dose.  He wrote down these names to pick them up.  We discussed GERD, the limitation of medications, and the need for diet and lifestyle changes.  He has been making efforts at that, and has some more to go.  I recommended upper endoscopy for further evaluation.  Based on findings, it might then be time to discuss additional work-up such as pH/impedance testing and manometry for consideration of either TIF or fundoplication.  This would also have to be considered in the context of his BMI.  Plan:  He was agreeable to upper endoscopy after discussion of procedure and risks.   The benefits and risks of the planned procedure were described in detail with the patient or (when appropriate) their health care proxy.  Risks were outlined as including, but not limited to, bleeding, infection, perforation, adverse medication reaction leading to cardiac or pulmonary decompensation.  The limitation of incomplete mucosal visualization was also discussed.  No guarantees or warranties were given.   My office will contact him to make arrangements.  Thank you for the courtesy of this consult.  Please call me with any questions or concerns.  Nelida Meuse III  CC: Referring provider noted above

## 2019-05-30 ENCOUNTER — Telehealth: Payer: Self-pay | Admitting: Gastroenterology

## 2019-05-30 NOTE — Telephone Encounter (Signed)

## 2019-05-31 ENCOUNTER — Encounter: Payer: Self-pay | Admitting: Gastroenterology

## 2019-05-31 ENCOUNTER — Other Ambulatory Visit: Payer: Self-pay

## 2019-05-31 ENCOUNTER — Ambulatory Visit (AMBULATORY_SURGERY_CENTER): Payer: Managed Care, Other (non HMO) | Admitting: Gastroenterology

## 2019-05-31 VITALS — BP 92/49 | HR 64 | Temp 98.3°F | Resp 15 | Ht 73.0 in | Wt 300.0 lb

## 2019-05-31 DIAGNOSIS — K219 Gastro-esophageal reflux disease without esophagitis: Secondary | ICD-10-CM | POA: Diagnosis not present

## 2019-05-31 DIAGNOSIS — K449 Diaphragmatic hernia without obstruction or gangrene: Secondary | ICD-10-CM

## 2019-05-31 DIAGNOSIS — R12 Heartburn: Secondary | ICD-10-CM

## 2019-05-31 MED ORDER — SODIUM CHLORIDE 0.9 % IV SOLN: 500.0000 mL | Freq: Once | INTRAVENOUS | Status: DC

## 2019-05-31 NOTE — Progress Notes (Signed)
A and O x3. Report to RN. Tolerated MAC anesthesia well.Teeth unchanged after procedure.

## 2019-05-31 NOTE — Progress Notes (Signed)
Called to room to assist during endoscopic procedure.  Patient ID and intended procedure confirmed with present staff. Received instructions for my participation in the procedure from the performing physician.  

## 2019-05-31 NOTE — Op Note (Signed)
[place] Patient Name: Robert Cruz Procedure Date: 05/31/2019 1:59 PM MRN: [id number] Endoscopist: Mallie Mussel L. Loletha Carrow , MD Age: 43 Referring MD:  Date of Birth: 1976-02-19 Gender: Male Account #: [id number] Procedure:                Upper GI endoscopy Indications:              Heartburn, Esophageal reflux symptoms that persist                            despite appropriate therapy (requires BID PPI for                            years) Medicines:                Monitored Anesthesia Care Procedure:                Pre-Anesthesia Assessment:                           - Prior to the procedure, a History and Physical                            was performed, and patient medications and                            allergies were reviewed. The patient's tolerance of                            previous anesthesia was also reviewed. The risks                            and benefits of the procedure and the sedation                            options and risks were discussed with the patient.                            All questions were answered, and informed consent                            was obtained. Prior Anticoagulants: The patient has                            taken no previous anticoagulant or antiplatelet                            agents. ASA Grade Assessment: II - A patient with                            mild systemic disease. After reviewing the risks                            and benefits, the patient was deemed in  satisfactory condition to undergo the procedure.                           After obtaining informed consent, the endoscope was                            passed under direct vision. Throughout the                            procedure, the patient's blood pressure, pulse, and                            oxygen saturations were monitored continuously. The                            Endoscope was introduced through the mouth, and                             advanced to the second part of duodenum. The upper                            GI endoscopy was accomplished without difficulty.                            The patient tolerated the procedure well. Scope In: Scope Out: Findings:                 Mucosal changes characterized by circumferential                            rings were found in the lower third of the                            esophagus. Biopsies were taken with a cold forceps                            for histology from both distal and mid esophagus.                           A 2 cm hiatal hernia was present.                           There is no endoscopic evidence of esophagitis,                            hiatal hernia, salmon-colored mucosa or stenosis in                            the entire esophagus.                           The stomach was normal.                           The cardia and gastric fundus were  normal on                            retroflexion.                           The examined duodenum was normal. Complications:            No immediate complications. Estimated Blood Loss:     Estimated blood loss was minimal. Impression:               - Circumferential rings mucosa in the esophagus.                            Biopsied.                           - 2 cm hiatal hernia.                           - Normal stomach.                           - Normal examined duodenum. Recommendation:           - Patient has a contact number available for                            emergencies. The signs and symptoms of potential                            delayed complications were discussed with the                            patient. Return to normal activities tomorrow.                            Written discharge instructions were provided to the                            patient.                           - Resume previous diet.                           - Continue present  medications.                           - Await pathology results. Then discuss possible                            additional testing such as pH/impedance and                            manometry. Prosper Paff L. Myrtie Neitheranis, MD 05/31/2019 2:18:40 PM This report has been signed electronically.

## 2019-05-31 NOTE — Progress Notes (Signed)
Pt's states no medical or surgical changes since previsit or office visit.  Covid screening C. [place], [place] Lorrin Goodell

## 2019-05-31 NOTE — Patient Instructions (Signed)
Information on hiatal hernia given to you today.  Await pathology results.  YOU HAD AN ENDOSCOPIC PROCEDURE TODAY AT [facility]:   Refer to the procedure report that was given to you for any specific questions about what was found during the examination.  If the procedure report does not answer your questions, please call your gastroenterologist to clarify.  If you requested that your care partner not be given the details of your procedure findings, then the procedure report has been included in a sealed envelope for you to review at your convenience later.  YOU SHOULD EXPECT: Some feelings of bloating in the abdomen. Passage of more gas than usual.  Walking can help get rid of the air that was put into your GI tract during the procedure and reduce the bloating. If you had a lower endoscopy (such as a colonoscopy or flexible sigmoidoscopy) you may notice spotting of blood in your stool or on the toilet paper. If you underwent a bowel prep for your procedure, you may not have a normal bowel movement for a few days.  Please Note:  You might notice some irritation and congestion in your nose or some drainage.  This is from the oxygen used during your procedure.  There is no need for concern and it should clear up in a day or so.  SYMPTOMS TO REPORT IMMEDIATELY:    Following upper endoscopy (EGD)  Vomiting of blood or coffee ground material  New chest pain or pain under the shoulder blades  Painful or persistently difficult swallowing  New shortness of breath  Fever of 100F or higher  Black, tarry-looking stools  For urgent or emergent issues, a gastroenterologist can be reached at any hour by calling [phone].   DIET:  We do recommend a small meal at first, but then you may proceed to your regular diet.  Drink plenty of fluids but you should avoid alcoholic beverages for 24 hours.  ACTIVITY:  You should plan to take it easy for the rest of today and you should NOT  DRIVE or use heavy machinery until tomorrow (because of the sedation medicines used during the test).    FOLLOW UP: Our staff will call the number listed on your records 48-72 hours following your procedure to check on you and address any questions or concerns that you may have regarding the information given to you following your procedure. If we do not reach you, we will leave a message.  We will attempt to reach you two times.  During this call, we will ask if you have developed any symptoms of COVID 19. If you develop any symptoms (ie: fever, flu-like symptoms, shortness of breath, cough etc.) before then, please call [phone].  If you test positive for Covid 19 in the 2 weeks post procedure, please call and report this information to [place].    If any biopsies were taken you will be contacted by phone or by letter within the next 1-3 weeks.  Please call us at [phone] if you have not heard about the biopsies in 3 weeks.    SIGNATURES/CONFIDENTIALITY: You and/or your care partner have signed paperwork which will be entered into your electronic medical record.  These signatures attest to the fact that that the information above on your After Visit Summary has been reviewed and is understood.  Full responsibility of the confidentiality of this discharge information lies with you and/or your care-partner.

## 2019-06-04 ENCOUNTER — Telehealth: Payer: Self-pay | Admitting: *Deleted

## 2019-06-04 ENCOUNTER — Telehealth: Payer: Self-pay

## 2019-06-04 NOTE — Telephone Encounter (Signed)
Patient called back and stated that he is doing well.  °

## 2019-06-04 NOTE — Telephone Encounter (Signed)
  Follow up Call-  Call back number 05/31/2019  Post procedure Call Back phone  # [phone]  Permission to leave phone message Yes  Some recent data might be hidden     Patient questions:  Do you have a fever, pain , or abdominal swelling? No. Pain Score  0 *  Have you tolerated food without any problems? Yes.    Have you been able to return to your normal activities? Yes.    Do you have any questions about your discharge instructions: Diet   No. Medications  No. Follow up visit  No.  Do you have questions or concerns about your Care? No.  Actions: * If pain score is 4 or above: No action needed, pain <4.  1. Have you developed a fever since your procedure? no  2.   Have you had an respiratory symptoms (SOB or cough) since your procedure? no  3.   Have you tested positive for COVID 19 since your procedure no  4.   Have you had any family members/close contacts diagnosed with the COVID 19 since your procedure?  no   If yes to any of these questions please route to Joylene John, RN and Alphonsa Gin, Therapist, sports.

## 2019-06-04 NOTE — Telephone Encounter (Signed)
Called [phone] and left a messaged we tried to reach pt for a follow up call. maw

## 2019-07-16 ENCOUNTER — Encounter: Payer: Self-pay | Admitting: Gastroenterology

## 2019-07-16 ENCOUNTER — Other Ambulatory Visit: Payer: Self-pay

## 2019-07-16 ENCOUNTER — Ambulatory Visit (INDEPENDENT_AMBULATORY_CARE_PROVIDER_SITE_OTHER): Payer: Managed Care, Other (non HMO) | Admitting: Gastroenterology

## 2019-07-16 DIAGNOSIS — K219 Gastro-esophageal reflux disease without esophagitis: Secondary | ICD-10-CM | POA: Diagnosis not present

## 2019-07-16 DIAGNOSIS — R12 Heartburn: Secondary | ICD-10-CM | POA: Diagnosis not present

## 2019-07-16 NOTE — Progress Notes (Signed)
This patient contacted our office requesting a physician telemedicine consultation regarding clinical questions and/or test results. Due to COVID restrictions, this was felt to be the most appropriate method of patient evaluation.  Participants on the conference : myself and patient   The patient consented to this consultation and was aware that a charge will be placed through their insurance.  They were also made aware of the limitations of telemedicine.  I was in my office and the patient was parked in his truck   Encounter time:  Total time 20 minutes, all spent on Doximity   _____________________________________________________________________________________________               [facility] Progress Note  Chief Complaint: GERD  Subjective  History: Years of heartburn with intermittent dysphagia.  Previous evaluation by Dr. Deatra Ina, recent telemedicine with me.  Symptoms persist despite multiple meds (BID PPI, carafate).  He has tried different PPIs and H2 blockers over the years.  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  EGD June 26 normal except 2 cm hiatal hernia and subtle mucosal finding of ringlike appearance in the distal esophagus.  Biopsies were normal.   He is on omeprazole 20 mg twice daily, has not yet been able to pick up his prescription for Protonix.  Things have been better lately, but he still has persistent heartburn and regurgitation.  He denies dysphagia or vomiting.  He struggles to lose weight.  ROS: Cardiovascular:  no chest pain Respiratory: no dyspnea  The patient's Past Medical, Family and Social History were reviewed and are on file in the EMR.  Objective:  Med list reviewed  Current Outpatient Medications:  .  atenolol (TENORMIN) 100 MG tablet, Take 1 tablet (100 mg total) by mouth daily., Disp: 90 tablet, Rfl: 3 .  hydrochlorothiazide  (HYDRODIURIL) 25 MG tablet, Take 1 tablet (25 mg total) by mouth daily., Disp: 90 tablet, Rfl: 3 .  lisinopril (PRINIVIL,ZESTRIL) 20 MG tablet, Take 1 tablet (20 mg total) by mouth daily., Disp: 90 tablet, Rfl: 3 .  pantoprazole (PROTONIX) 40 MG tablet, Take 1 tablet (40 mg total) by mouth 2 (two) times daily., Disp: 60 tablet, Rfl: 3 .  spironolactone (ALDACTONE) 100 MG tablet, Take 1 and a half tablets (150 mg) once daily, Disp: 135 tablet, Rfl: 3   Vital signs in last 24 hrs: There were no vitals filed for this visit.  Physical Exam  No exam-virtual visit    @ASSESSMENTPLANBEGIN @ Assessment: Encounter Diagnoses  Name Primary?  . Gastroesophageal reflux disease without esophagitis Yes  . Heart burn   . Severe obesity (BMI >= 40) (HCC)     Years of persistent reflux symptoms despite medication.  I think his obesity is playing a large role.  While he might be a candidate for TIF since the hiatal hernia is small, he is BMI may affect the likelihood of a good long-term outcome from that.  He is interested to learn more, so I will discuss it with Dr. Bryan Lemma.  If he thinks it may be favorable, then Dickey will need further testing such as pH and manometry to demonstrate and quantify reflux.  Alternatively, bariatric surgery is a consideration.  I broach the subject with Shanon Brow as well.  Depending on Dr. Vivia Ewing feelings regarding TIF, we might move instead in the direction of her bariatric evaluation.  This might also solve other health  issues such as hypertension and sleep.  Plan:  After that dialogue, I will get back with Onalee Huaavid by phone on further plans. Meanwhile, he will continue current management.  Charlie PitterHenry L Danis III

## 2019-07-18 ENCOUNTER — Telehealth: Payer: Self-pay | Admitting: Gastroenterology

## 2019-07-18 NOTE — Telephone Encounter (Signed)
-----   Message from Avant, DO sent at 07/17/2019  5:59 PM EDT ----- Stana Bayon,I reviewed your notes on this gentleman, and agree with you that bariatric surgery would work better for him given his elevated BMI >40.  Just as you pointed out in your note, there is a high risk of failure for TIF in patients with BMI >35.  Completely agree with you. Thank you.Vito ----- Message ----- From: Doran Stabler, MD Sent: 07/17/2019  12:06 PM EDT To: Lavena Bullion, DO  Vito,   When you return to the office next week, I would like to have a dialogue with you about this patient regarding GERD and TIF consideration.  Have not done pH or barium study, as I wonder if bariatric surgery is a better overall consideration for him. My recent telemedicine visit and the EGD reports are in the chart.  Thanks very much.  - HD

## 2019-07-18 NOTE — Telephone Encounter (Signed)
Information faxed to [facility] per Dr. Loletha Carrow' orders.

## 2019-07-18 NOTE — Telephone Encounter (Signed)
Robert Cruz,  I spoke with this patient today after reviewing his case with Dr. Bryan Lemma.  Recommended a consultation with a bariatric surgeon regarding his GERD and obesity.  He is very interested in pursuing that.  Please send a referral to Dr. Greer Pickerel at [facility] , send my office notes (two telemed visits) and EGD report.

## 2019-09-08 ENCOUNTER — Emergency Department (HOSPITAL_BASED_OUTPATIENT_CLINIC_OR_DEPARTMENT_OTHER): Payer: Managed Care, Other (non HMO)

## 2019-09-08 ENCOUNTER — Emergency Department (HOSPITAL_BASED_OUTPATIENT_CLINIC_OR_DEPARTMENT_OTHER)
Admission: EM | Admit: 2019-09-08 | Discharge: 2019-09-08 | Disposition: A | Discharge status: Home / Self Care | Payer: Managed Care, Other (non HMO) | Attending: Emergency Medicine | Admitting: Emergency Medicine

## 2019-09-08 ENCOUNTER — Encounter (HOSPITAL_BASED_OUTPATIENT_CLINIC_OR_DEPARTMENT_OTHER): Payer: Self-pay | Admitting: Adult Health

## 2019-09-08 ENCOUNTER — Other Ambulatory Visit: Payer: Self-pay

## 2019-09-08 DIAGNOSIS — Z79899 Other long term (current) drug therapy: Secondary | ICD-10-CM | POA: Insufficient documentation

## 2019-09-08 DIAGNOSIS — I10 Essential (primary) hypertension: Secondary | ICD-10-CM | POA: Diagnosis not present

## 2019-09-08 DIAGNOSIS — E669 Obesity, unspecified: Secondary | ICD-10-CM | POA: Diagnosis not present

## 2019-09-08 DIAGNOSIS — Z6839 Body mass index (BMI) 39.0-39.9, adult: Secondary | ICD-10-CM | POA: Diagnosis not present

## 2019-09-08 DIAGNOSIS — M5412 Radiculopathy, cervical region: Secondary | ICD-10-CM | POA: Diagnosis not present

## 2019-09-08 DIAGNOSIS — R200 Anesthesia of skin: Secondary | ICD-10-CM | POA: Diagnosis present

## 2019-09-08 LAB — URINALYSIS, MICROSCOPIC (REFLEX)

## 2019-09-08 LAB — URINALYSIS, ROUTINE W REFLEX MICROSCOPIC
Bilirubin Urine: NEGATIVE
Glucose, UA: NEGATIVE mg/dL
Hgb urine dipstick: NEGATIVE
Ketones, ur: NEGATIVE mg/dL
Nitrite: NEGATIVE
Protein, ur: NEGATIVE mg/dL
Specific Gravity, Urine: 1.02 (ref 1.005–1.030)
pH: 6 (ref 5.0–8.0)

## 2019-09-08 LAB — COMPREHENSIVE METABOLIC PANEL
ALT: 13 U/L (ref 0–44)
AST: 18 U/L (ref 15–41)
Albumin: 4.2 g/dL (ref 3.5–5.0)
Alkaline Phosphatase: 68 U/L (ref 38–126)
Anion gap: 10 (ref 5–15)
BUN: 17 mg/dL (ref 6–20)
CO2: 22 mmol/L (ref 22–32)
Calcium: 9.1 mg/dL (ref 8.9–10.3)
Chloride: 104 mmol/L (ref 98–111)
Creatinine, Ser: 1.44 mg/dL — ABNORMAL HIGH (ref 0.61–1.24)
GFR calc Af Amer: >60 mL/min (ref >60)
GFR calc non Af Amer: 59 mL/min — ABNORMAL LOW (ref >60)
Glucose, Bld: 110 mg/dL — ABNORMAL HIGH (ref 70–99)
Potassium: 3.9 mmol/L (ref 3.5–5.1)
Sodium: 136 mmol/L (ref 135–145)
Total Bilirubin: 0.4 mg/dL (ref 0.3–1.2)
Total Protein: 7.9 g/dL (ref 6.5–8.1)

## 2019-09-08 LAB — CBC
HCT: 35.7 % — ABNORMAL LOW (ref 39.0–52.0)
Hemoglobin: 11.4 g/dL — ABNORMAL LOW (ref 13.0–17.0)
MCH: 26.8 pg (ref 26.0–34.0)
MCHC: 31.9 g/dL (ref 30.0–36.0)
MCV: 83.8 fL (ref 80.0–100.0)
Platelets: 380 10*3/uL (ref 150–400)
RBC: 4.26 MIL/uL (ref 4.22–5.81)
RDW: 14.3 % (ref 11.5–15.5)
WBC: 7.5 10*3/uL (ref 4.0–10.5)
nRBC: 0 % (ref 0.0–0.2)

## 2019-09-08 LAB — DIFFERENTIAL
Abs Immature Granulocytes: 0.01 10*3/uL (ref 0.00–0.07)
Basophils Absolute: 0 10*3/uL (ref 0.0–0.1)
Basophils Relative: 0 %
Eosinophils Absolute: 0.3 10*3/uL (ref 0.0–0.5)
Eosinophils Relative: 4 %
Immature Granulocytes: 0 %
Lymphocytes Relative: 30 %
Lymphs Abs: 2.3 10*3/uL (ref 0.7–4.0)
Monocytes Absolute: 0.5 10*3/uL (ref 0.1–1.0)
Monocytes Relative: 6 %
Neutro Abs: 4.4 10*3/uL (ref 1.7–7.7)
Neutrophils Relative %: 60 %

## 2019-09-08 MED ORDER — DEXAMETHASONE SODIUM PHOSPHATE 10 MG/ML IJ SOLN
10.0000 mg | Freq: Once | INTRAMUSCULAR | Status: AC
  Administered 2019-09-08: 10 mg via INTRAVENOUS
  Filled 2019-09-08: qty 1

## 2019-09-08 MED ORDER — PREDNISONE 10 MG (21) PO TBPK: ORAL_TABLET | ORAL | 0 refills | Status: DC

## 2019-09-08 NOTE — ED Provider Notes (Signed)
[facility] EMERGENCY DEPARTMENT Provider Note   CSN: [id number] Arrival date & time: 09/08/19  2033     History   Chief Complaint Chief Complaint  Patient presents with  . Numbness    HPI Robert Cruz is a 43 y.o. male.     Pt presents to the ED today with right thumb numbness and right arm numbness.  The pt said he noticed the thumb numbness today around 0500.  The numbness is radiating up his right arm.  He has some numbness at times in his right leg.  He is not having trouble walking and talking.  No speech difficulties.  A cousin recently had a stroke and he's worried he's having one.     Past Medical History:  Diagnosis Date  . Chicken pox   . GERD (gastroesophageal reflux disease)   . Hypertension   . Morbid obesity (HCC)   . Sleep apnea     Patient Active Problem List   Diagnosis Date Noted  . Mixed hyperlipidemia 09/08/2017  . Allergic rhinitis 10/11/2016  . Sore throat 10/11/2016  . Chronic migraine without aura without status migrainosus, not intractable 01/14/2016  . 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 Generalized headache 12/16/2015  . Encounter for preventive health examination 09/06/2013  . GERD (gastroesophageal reflux disease) 09/06/2013  . DYSPHAGIA UNSPECIFIED 02/08/2011  . OSA (obstructive sleep apnea) 12/28/2008  . Severe obesity (BMI >= 40) (HCC) 10/07/2008  . Essential hypertension 08/07/2007    History reviewed. No pertinent surgical history.      Home Medications    Prior to Admission medications   Medication Sig Start Date End Date Taking? Authorizing Provider  atenolol (TENORMIN) 100 MG tablet Take 1 tablet (100 mg total) by mouth daily. 02/07/19   Berton BonHammond, Janine, NP  hydrochlorothiazide (HYDRODIURIL) 25 MG tablet Take 1 tablet (25 mg total) by mouth daily. 02/07/19   Berton BonHammond, Janine, NP  lisinopril (PRINIVIL,ZESTRIL) 20 MG tablet Take 1 tablet (20 mg total) by mouth daily. 02/07/19   Berton BonHammond, Janine, NP  pantoprazole  (PROTONIX) 40 MG tablet Take 1 tablet (40 mg total) by mouth 2 (two) times daily. 05/13/19   Olive BassMurray, Laura Woodruff, FNP  predniSONE (STERAPRED UNI-PAK 21 TAB) 10 MG (21) TBPK tablet Take 6 tabs for 2 days, then 5 for 2 days, then 4 for 2 days, then 3 for 2 days, 2 for 2 days, then 1 for 2 days 09/08/19   Jacalyn LefevreHaviland, Temitope Griffing, MD  spironolactone (ALDACTONE) 100 MG tablet Take 1 and a half tablets (150 mg) once daily 02/07/19   Berton BonHammond, Janine, NP    Family History Family History  Problem Relation Age of Onset  . Hypertension Father   . Depression Father   . Hypertension Mother   . Kidney disease Maternal Grandmother        Dialysis  . Hypertension Maternal Grandmother   . Healthy Brother        x1  . Healthy Sister        x3  . Colon cancer Neg Hx   . Esophageal cancer Neg Hx   . Rectal cancer Neg Hx   . Stomach cancer Neg Hx     Social History Social History   Tobacco Use  . Smoking status: Never Smoker  . Smokeless tobacco: Never Used  Substance Use Topics  . Alcohol use: No  . Drug use: No     Allergies   Amlodipine and Bystolic [nebivolol hcl]   Review of Systems Review of Systems  Neurological: Positive for numbness.  All other systems reviewed and are negative.    Physical Exam Updated Vital Signs BP 133/84   Pulse 77   Temp 98.3 F (36.8 C) (Oral)   Resp 18   Ht 6\' 1"  (1.854 m)   Wt 136.1 kg   SpO2 98%   BMI 39.58 kg/m   Physical Exam Vitals signs and nursing note reviewed.  Constitutional:      Appearance: Normal appearance. He is obese.  HENT:     Head: Normocephalic and atraumatic.     Right Ear: External ear normal.     Left Ear: External ear normal.     Nose: Nose normal.     Mouth/Throat:     Mouth: Mucous membranes are moist.     Pharynx: Oropharynx is clear.  Eyes:     Extraocular Movements: Extraocular movements intact.     Conjunctiva/sclera: Conjunctivae normal.     Pupils: Pupils are equal, round, and reactive to light.  Neck:      Musculoskeletal: Normal range of motion and neck supple.  Cardiovascular:     Rate and Rhythm: Normal rate and regular rhythm.     Pulses: Normal pulses.     Heart sounds: Normal heart sounds.  Pulmonary:     Effort: Pulmonary effort is normal.     Breath sounds: Normal breath sounds.  Abdominal:     General: Abdomen is flat. Bowel sounds are normal.     Palpations: Abdomen is soft.  Musculoskeletal: Normal range of motion.  Skin:    General: Skin is warm.     Capillary Refill: Capillary refill takes less than 2 seconds.  Neurological:     General: No focal deficit present.     Mental Status: He is alert and oriented to person, place, and time.     Comments: Numbness to right thumb  Psychiatric:        Mood and Affect: Mood normal.        Behavior: Behavior normal.        Thought Content: Thought content normal.        Judgment: Judgment normal.      ED Treatments / Results  Labs (all labs ordered are listed, but only abnormal results are displayed) Labs Reviewed  CBC - Abnormal; Notable for the following components:      Result Value   Hemoglobin 11.4 (*)    HCT 35.7 (*)    All other components within normal limits  COMPREHENSIVE METABOLIC PANEL - Abnormal; Notable for the following components:   Glucose, Bld 110 (*)    Creatinine, Ser 1.44 (*)    GFR calc non Af Amer 59 (*)    All other components within normal limits  URINALYSIS, ROUTINE W REFLEX MICROSCOPIC - Abnormal; Notable for the following components:   Leukocytes,Ua TRACE (*)    All other components within normal limits  URINALYSIS, MICROSCOPIC (REFLEX) - Abnormal; Notable for the following components:   Bacteria, UA MANY (*)    All other components within normal limits  DIFFERENTIAL    EKG EKG Interpretation  Date/Time:  Sunday September 08 2019 21:17:46 EDT Ventricular Rate:  70 PR Interval:    QRS Duration: 84 QT Interval:  387 QTC Calculation: 418 R Axis:   22 Text Interpretation:  Sinus rhythm  Low voltage, precordial leads No significant change since last tracing Confirmed by Isla Pence [phone]) on 09/08/2019 9:26:37 PM   Radiology Ct Head Wo Contrast  Result Date: 09/08/2019  CLINICAL DATA:  Right arm tingling and numbness first noticed at 5:30 a.m. today. The numbness has extended more proximally in the arm during the day. Right leg paresthesias. EXAM: CT HEAD WITHOUT CONTRAST CT CERVICAL SPINE WITHOUT CONTRAST TECHNIQUE: Multidetector CT imaging of the head and cervical spine was performed following the standard protocol without intravenous contrast. Multiplanar CT image reconstructions of the cervical spine were also generated. COMPARISON:  Head CT dated 10/19/2015 FINDINGS: CT HEAD FINDINGS Brain: Normal appearing cerebral hemispheres and posterior fossa structures. Normal size and position of the ventricles. No intracranial hemorrhage, mass lesion or CT evidence of acute infarction. Vascular: No hyperdense vessel or unexpected calcification. Skull: Normal. Negative for fracture or focal lesion. Sinuses/Orbits: Moderate left maxillary sinus mucosal thickening. Unremarkable orbits. Other: None. CT CERVICAL SPINE FINDINGS Alignment: Reversal of the normal cervical lordosis. No subluxations. Skull base and vertebrae: No acute fracture. No primary bone lesion or focal pathologic process. Soft tissues and spinal canal: No prevertebral fluid or swelling. No visible canal hematoma. Disc levels:  Normal. Upper chest: Clear lung apices. Other: 9 mm right lobe thyroid nodule. IMPRESSION: 1. No intracranial abnormality. 2. No cervical spine abnormality other than reversal the normal lordosis. 3. 9 mm right lobe thyroid nodule, too small to characterize, but most likely benign in the absence of known clinical risk factors for thyroid carcinoma. Electronically Signed   By: Beckie Salts M.D.   On: 09/08/2019 21:24   Ct Cervical Spine Wo Contrast  Result Date: 09/08/2019 CLINICAL DATA:  Right arm  tingling and numbness first noticed at 5:30 a.m. today. The numbness has extended more proximally in the arm during the day. Right leg paresthesias. EXAM: CT HEAD WITHOUT CONTRAST CT CERVICAL SPINE WITHOUT CONTRAST TECHNIQUE: Multidetector CT imaging of the head and cervical spine was performed following the standard protocol without intravenous contrast. Multiplanar CT image reconstructions of the cervical spine were also generated. COMPARISON:  Head CT dated 10/19/2015 FINDINGS: CT HEAD FINDINGS Brain: Normal appearing cerebral hemispheres and posterior fossa structures. Normal size and position of the ventricles. No intracranial hemorrhage, mass lesion or CT evidence of acute infarction. Vascular: No hyperdense vessel or unexpected calcification. Skull: Normal. Negative for fracture or focal lesion. Sinuses/Orbits: Moderate left maxillary sinus mucosal thickening. Unremarkable orbits. Other: None. CT CERVICAL SPINE FINDINGS Alignment: Reversal of the normal cervical lordosis. No subluxations. Skull base and vertebrae: No acute fracture. No primary bone lesion or focal pathologic process. Soft tissues and spinal canal: No prevertebral fluid or swelling. No visible canal hematoma. Disc levels:  Normal. Upper chest: Clear lung apices. Other: 9 mm right lobe thyroid nodule. IMPRESSION: 1. No intracranial abnormality. 2. No cervical spine abnormality other than reversal the normal lordosis. 3. 9 mm right lobe thyroid nodule, too small to characterize, but most likely benign in the absence of known clinical risk factors for thyroid carcinoma. Electronically Signed   By: Beckie Salts M.D.   On: 09/08/2019 21:24    Procedures Procedures (including critical care time)  Medications Ordered in ED Medications  dexamethasone (DECADRON) injection 10 mg (10 mg Intravenous Given 09/08/19 2126)     Initial Impression / Assessment and Plan / ED Course  I have reviewed the triage vital signs and the nursing notes.   Pertinent labs & imaging results that were available during my care of the patient were reviewed by me and considered in my medical decision making (see chart for details).     Neuro exam nl except for numbness to the right  thumb.  With the description of numbness to the right arm, it sounds like a cervical radiculopathy.  Not a CVA.  Pt is encouraged to f/u with pcp.  Return if worse.  Final Clinical Impressions(s) / ED Diagnoses   Final diagnoses:  Cervical radiculopathy    ED Discharge Orders         Ordered    predniSONE (STERAPRED UNI-PAK 21 TAB) 10 MG (21) TBPK tablet     09/08/19 2145           Jacalyn Lefevre, MD 09/08/19 2147

## 2019-09-08 NOTE — ED Triage Notes (Signed)
Robert Cruz presents with right arm tingling and numbness. The numbness began with just the tip of the thumb when he woke up with that numbness at 5:30 am. Throughout the day the numbness has traveled up the arm and into bicep and elbow. He also reports that the right leg feels lke it discomfort or like it has fallen asleep. HE denies chest pain and SOB. Denies feeling off balance or dizzy right now. 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

## 2019-10-03 ENCOUNTER — Ambulatory Visit (INDEPENDENT_AMBULATORY_CARE_PROVIDER_SITE_OTHER): Payer: Managed Care, Other (non HMO) | Admitting: Internal Medicine

## 2019-10-03 ENCOUNTER — Ambulatory Visit: Payer: Managed Care, Other (non HMO)

## 2019-10-03 ENCOUNTER — Other Ambulatory Visit: Payer: Self-pay

## 2019-10-03 ENCOUNTER — Encounter: Payer: Self-pay | Admitting: Internal Medicine

## 2019-10-03 DIAGNOSIS — R05 Cough: Secondary | ICD-10-CM

## 2019-10-03 DIAGNOSIS — Z20822 Contact with and (suspected) exposure to covid-19: Secondary | ICD-10-CM

## 2019-10-03 DIAGNOSIS — R519 Headache, unspecified: Secondary | ICD-10-CM | POA: Diagnosis not present

## 2019-10-03 DIAGNOSIS — J309 Allergic rhinitis, unspecified: Secondary | ICD-10-CM | POA: Diagnosis not present

## 2019-10-03 DIAGNOSIS — R051 Acute cough: Secondary | ICD-10-CM | POA: Insufficient documentation

## 2019-10-03 DIAGNOSIS — Z20828 Contact with and (suspected) exposure to other viral communicable diseases: Secondary | ICD-10-CM | POA: Diagnosis not present

## 2019-10-03 DIAGNOSIS — R059 Cough, unspecified: Secondary | ICD-10-CM | POA: Insufficient documentation

## 2019-10-03 MED ORDER — ONDANSETRON HCL 4 MG PO TABS: 4.0000 mg | ORAL_TABLET | Freq: Three times a day (TID) | ORAL | 1 refills | Status: DC | PRN

## 2019-10-03 MED ORDER — HYDROCODONE-HOMATROPINE 5-1.5 MG/5ML PO SYRP: 5.0000 mL | ORAL_SOLUTION | Freq: Four times a day (QID) | ORAL | 0 refills | Status: AC | PRN

## 2019-10-03 NOTE — Assessment & Plan Note (Addendum)
Etiology unclear, for cough med and zofran prn, but also refer COVID testing, consider cxr if covid neg, ok for flu shot if covid neg next wk

## 2019-10-03 NOTE — Assessment & Plan Note (Signed)
stable overall by history and exam, recent data reviewed with pt, and pt to continue medical treatment as before,  to f/u any worsening symptoms or concerns  

## 2019-10-03 NOTE — Patient Instructions (Signed)
Please take all new medication as prescribed  [facility]

## 2019-10-03 NOTE — Progress Notes (Signed)
Patient ID: Robert Cruz, male   DOB: 09-Feb-1976, 43 y.o.   MRN: [id number]  Virtual Visit via Video Note  I connected with [name] on 10/03/19 at  3:20 PM EDT by a video enabled telemedicine application and verified that I am speaking with the correct person using two identifiers.  Location: Patient: at home Provider: at office   I discussed the limitations of evaluation and management by telemedicine and the availability of in person appointments. The patient expressed understanding and agreed to proceed.  History of Present Illness: Here with 2 wks acute onset dry cough non productive that now involves HA every time, occurs every few minutes, hard to sleep, associated with fatigue, but denies fever, ear pain, sinus congestion, ST, sob, wheezing, abd pain, diarrhea or dysuria.  3yo Son had recent cough as well now resolved but prompted several peds visits.  No known covid exposure but is also wanting flu shot here in the office.  Pt denies new neurological symptoms such as new headache, or facial or extremity weakness or numbness   Pt denies polydipsia, polyuria Past Medical History:  Diagnosis Date  . Chicken pox   . GERD (gastroesophageal reflux disease)   . Hypertension   . Morbid obesity (HCC)   . Sleep apnea    No past surgical history on file.  reports that he has never smoked. He has never used smokeless tobacco. He reports that he does not drink alcohol or use drugs. family history includes Depression in his father; Healthy in his brother and sister; Hypertension in his father, maternal grandmother, and mother; Kidney disease in his maternal grandmother. Allergies  Allergen Reactions  . Amlodipine Other (See Comments)    edema  . Bystolic [Nebivolol Hcl] Other (See Comments)    Patient stated medication gave him major headaches   Current Outpatient Medications on File Prior to Visit  Medication Sig Dispense Refill  . atenolol (TENORMIN) 100 MG tablet Take 1  tablet (100 mg total) by mouth daily. 90 tablet 3  . hydrochlorothiazide (HYDRODIURIL) 25 MG tablet Take 1 tablet (25 mg total) by mouth daily. 90 tablet 3  . lisinopril (PRINIVIL,ZESTRIL) 20 MG tablet Take 1 tablet (20 mg total) by mouth daily. 90 tablet 3  . pantoprazole (PROTONIX) 40 MG tablet Take 1 tablet (40 mg total) by mouth 2 (two) times daily. 60 tablet 3  . predniSONE (STERAPRED UNI-PAK 21 TAB) 10 MG (21) TBPK tablet Take 6 tabs for 2 days, then 5 for 2 days, then 4 for 2 days, then 3 for 2 days, 2 for 2 days, then 1 for 2 days 42 tablet 0  . spironolactone (ALDACTONE) 100 MG tablet Take 1 and a half tablets (150 mg) once daily 135 tablet 3   No current facility-administered medications on file prior to visit.     Observations/Objective: Alert, NAD, appropriate mood and affect, resps normal, cn 2-12 intact, moves all 4s, no visible rash or swelling Lab Results  Component Value Date   WBC 7.5 09/08/2019   HGB 11.4 (L) 09/08/2019   HCT 35.7 (L) 09/08/2019   PLT 380 09/08/2019   GLUCOSE 110 (H) 09/08/2019   CHOL 160 07/13/2018   TRIG 123.0 07/13/2018   HDL 31.90 (L) 07/13/2018   LDLCALC 104 (H) 07/13/2018   ALT 13 09/08/2019   AST 18 09/08/2019   NA 136 09/08/2019   K 3.9 09/08/2019   CL 104 09/08/2019   CREATININE 1.44 (H) 09/08/2019   BUN 17 09/08/2019  CO2 22 09/08/2019   TSH 1.13 07/13/2018   PSA 0.48 07/13/2018   HGBA1C 6.4 07/27/2016   Assessment and Plan: See notes  Follow Up Instructions: Seen notes   I discussed the assessment and treatment plan with the patient. The patient was provided an opportunity to ask questions and all were answered. The patient agreed with the plan and demonstrated an understanding of the instructions.   The patient was advised to call back or seek an in-person evaluation if the symptoms worsen or if the condition fails to improve as anticipated.  Cathlean Cower, MD

## 2019-10-03 NOTE — Assessment & Plan Note (Signed)
For tylenol prn 

## 2019-10-04 ENCOUNTER — Other Ambulatory Visit: Payer: Self-pay

## 2019-10-04 DIAGNOSIS — Z20822 Contact with and (suspected) exposure to covid-19: Secondary | ICD-10-CM

## 2019-10-06 LAB — NOVEL CORONAVIRUS, NAA: SARS-CoV-2, NAA: NOT DETECTED

## 2019-10-10 ENCOUNTER — Ambulatory Visit: Payer: Managed Care, Other (non HMO)

## 2019-10-15 ENCOUNTER — Encounter (HOSPITAL_COMMUNITY): Payer: Self-pay | Admitting: Emergency Medicine

## 2019-10-15 ENCOUNTER — Ambulatory Visit (HOSPITAL_COMMUNITY)
Admission: EM | Admit: 2019-10-15 | Discharge: 2019-10-15 | Disposition: A | Discharge status: Home / Self Care | Payer: Managed Care, Other (non HMO) | Attending: Family Medicine | Admitting: Family Medicine

## 2019-10-15 DIAGNOSIS — M25512 Pain in left shoulder: Secondary | ICD-10-CM | POA: Diagnosis not present

## 2019-10-15 MED ORDER — KETOROLAC TROMETHAMINE 30 MG/ML IJ SOLN
30.0000 mg | Freq: Once | INTRAMUSCULAR | Status: AC
  Administered 2019-10-15: 30 mg via INTRAMUSCULAR

## 2019-10-15 MED ORDER — METHYLPREDNISOLONE ACETATE 40 MG/ML IJ SUSP
INTRAMUSCULAR | Status: AC
  Filled 2019-10-15: qty 1

## 2019-10-15 MED ORDER — KETOROLAC TROMETHAMINE 30 MG/ML IJ SOLN
INTRAMUSCULAR | Status: AC
  Filled 2019-10-15: qty 1

## 2019-10-15 MED ORDER — METHYLPREDNISOLONE ACETATE 40 MG/ML IJ SUSP
40.0000 mg | Freq: Once | INTRAMUSCULAR | Status: AC
  Administered 2019-10-15: 40 mg via INTRAMUSCULAR

## 2019-10-15 NOTE — ED Provider Notes (Signed)
[facility]    CSN: [id number] Arrival date & time: 10/15/19  1731      History   Chief Complaint Chief Complaint  Patient presents with  . Arm Pain    HPI Robert Cruz is a 43 y.o. male.   He is presenting left arm pain.  He is having left shoulder pain as well as lack of extension at the elbow.  The symptoms been ongoing for the past 3 days.  He denies any specific inciting event.  He thought he laid on it wrong.  The pain is gotten worse.  No improvement with modalities to date.  He has a history of gout in his foot and toes.  Denies any significant redness or streaking.  No fevers or chills.  No history of surgery at the elbow or shoulder.  Pain is sharp and throbbing.  No improvement with ice or Tylenol.  Pain seems to be originating from the shoulder and emanating down to the elbow.  HPI  Past Medical History:  Diagnosis Date  . Chicken pox   . GERD (gastroesophageal reflux disease)   . Hypertension   . Morbid obesity (HCC)   . Sleep apnea     Patient Active Problem List   Diagnosis Date Noted  . Cough 10/03/2019  . Mixed hyperlipidemia 09/08/2017  . Allergic rhinitis 10/11/2016  . Sore throat 10/11/2016  . Chronic migraine without aura without status migrainosus, not intractable 01/14/2016  . 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 Generalized headache 12/16/2015  . Encounter for preventive health examination 09/06/2013  . GERD (gastroesophageal reflux disease) 09/06/2013  . DYSPHAGIA UNSPECIFIED 02/08/2011  . OSA (obstructive sleep apnea) 12/28/2008  . Severe obesity (BMI >= 40) (HCC) 10/07/2008  . Essential hypertension 08/07/2007    History reviewed. No pertinent surgical history.     Home Medications    Prior to Admission medications   Medication Sig Start Date End Date Taking? Authorizing Provider  spironolactone (ALDACTONE) 100 MG tablet Take 1 and a half tablets (150 mg) once daily 02/07/19  Yes Berton BonHammond, Janine, NP  atenolol (TENORMIN)  100 MG tablet Take 1 tablet (100 mg total) by mouth daily. 02/07/19   Berton BonHammond, Janine, NP  hydrochlorothiazide (HYDRODIURIL) 25 MG tablet Take 1 tablet (25 mg total) by mouth daily. 02/07/19   Berton BonHammond, Janine, NP  lisinopril (PRINIVIL,ZESTRIL) 20 MG tablet Take 1 tablet (20 mg total) by mouth daily. 02/07/19   Berton BonHammond, Janine, NP  ondansetron (ZOFRAN) 4 MG tablet Take 1 tablet (4 mg total) by mouth every 8 (eight) hours as needed for nausea or vomiting. 10/03/19   Corwin LevinsJohn, James W, MD  pantoprazole (PROTONIX) 40 MG tablet Take 1 tablet (40 mg total) by mouth 2 (two) times daily. 05/13/19   Olive BassMurray, Laura Woodruff, FNP  predniSONE (STERAPRED UNI-PAK 21 TAB) 10 MG (21) TBPK tablet Take 6 tabs for 2 days, then 5 for 2 days, then 4 for 2 days, then 3 for 2 days, 2 for 2 days, then 1 for 2 days 09/08/19   Jacalyn LefevreHaviland, Julie, MD    Family History Family History  Problem Relation Age of Onset  . Hypertension Father   . Depression Father   . Hypertension Mother   . Kidney disease Maternal Grandmother        Dialysis  . Hypertension Maternal Grandmother   . Healthy Brother        x1  . Healthy Sister        x3  . Colon cancer Neg  Hx   . Esophageal cancer Neg Hx   . Rectal cancer Neg Hx   . Stomach cancer Neg Hx     Social History Social History   Tobacco Use  . Smoking status: Never Smoker  . Smokeless tobacco: Never Used  Substance Use Topics  . Alcohol use: No  . Drug use: No     Allergies   Amlodipine and Bystolic [nebivolol hcl]   Review of Systems Review of Systems  Constitutional: Negative for fever.  HENT: Negative for congestion.   Respiratory: Negative for cough.   Cardiovascular: Negative for chest pain.  Gastrointestinal: Negative for abdominal pain.  Musculoskeletal: Positive for myalgias.  Skin: Negative for color change.  Neurological: Negative for weakness.  Hematological: Negative for adenopathy.     Physical Exam Triage Vital Signs ED Triage Vitals [10/15/19 1844]   Enc Vitals Group     BP 121/66     Pulse Rate 76     Resp 18     Temp 97.8 F (36.6 C)     Temp src      SpO2 100 %     Weight      Height      Head Circumference      Peak Flow      Pain Score 9     Pain Loc      Pain Edu?      Excl. in GC?    No data found.  Updated Vital Signs BP 121/66   Pulse 76   Temp 97.8 F (36.6 C)   Resp 18   SpO2 100%   Visual Acuity Right Eye Distance:   Left Eye Distance:   Bilateral Distance:    Right Eye Near:   Left Eye Near:    Bilateral Near:     Physical Exam Gen: NAD, alert, cooperative with exam, well-appearing ENT: normal lips, normal nasal mucosa,  Eye: normal EOM, normal conjunctiva and lids CV:  no edema, +2 pedal pulses   Resp: no accessory muscle use, non-labored,  Skin: no rashes, no areas of induration  Neuro: normal tone, normal sensation to touch Psych:  normal insight, alert and oriented MSK:  Left elbow: Lack of full extension. No obvious effusion. No redness or swelling. No tenderness to palpation over the lateral epicondyle. Left shoulder. Normal flexion and abduction. Normal internal and external rotation. Pain with internal rotation and abduction. Positive empty cans test. Neurovascular intact  UC Treatments / Results  Labs (all labs ordered are listed, but only abnormal results are displayed) Labs Reviewed - No data to display  EKG   Radiology No results found.  Procedures Procedures (including critical care time)  Medications Ordered in UC Medications  methylPREDNISolone acetate (DEPO-MEDROL) injection 40 mg (40 mg Intramuscular Given 10/15/19 1945)  ketorolac (TORADOL) 30 MG/ML injection 30 mg (30 mg Intramuscular Given 10/15/19 1945)  ketorolac (TORADOL) 30 MG/ML injection (has no administration in time range)  methylPREDNISolone acetate (DEPO-MEDROL) 40 MG/ML injection (has no administration in time range)    Initial Impression / Assessment and Plan / UC Course  I have reviewed  the triage vital signs and the nursing notes.  Pertinent labs & imaging results that were available during my care of the patient were reviewed by me and considered in my medical decision making (see chart for details).     Mr. Utz is a 43 year old male that is presenting with left shoulder pain.  Symptoms do not match any specific origin.  It seems  to be more of the shoulder as the source of his pain.  Does have good range of motion but pain is severe.  Seems to be more capsulitis in origin.  May be related to gout.  He does lack full extension at the elbow but no signs of infection to suggest a septic joint.  Provided intramuscular Toradol and Depo.  Counseled on supportive care.  Given indications to follow-up for ongoing evaluations.  Given indications to seek more immediate care.  Final Clinical Impressions(s) / UC Diagnoses   Final diagnoses:  Acute pain of left shoulder     Discharge Instructions     We will call you for an appointment.  Please try to ice or heat the shoulder     ED Prescriptions    None     PDMP not reviewed this encounter.   Rosemarie Ax, MD 10/15/19 2035

## 2019-10-15 NOTE — Discharge Instructions (Signed)
We will call you for an appointment.  Please try to ice or heat the shoulder

## 2019-10-15 NOTE — ED Triage Notes (Signed)
Pt states for the last three days hes had L arm pain, states it started off as feeling like a muscle, now its worse and its very painful to move his L arm. Denies injury. 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

## 2019-10-17 ENCOUNTER — Ambulatory Visit: Payer: Managed Care, Other (non HMO) | Admitting: Family Medicine

## 2019-10-17 ENCOUNTER — Other Ambulatory Visit: Payer: Self-pay

## 2019-10-17 ENCOUNTER — Encounter: Payer: Self-pay | Admitting: Family Medicine

## 2019-10-17 ENCOUNTER — Ambulatory Visit (INDEPENDENT_AMBULATORY_CARE_PROVIDER_SITE_OTHER): Payer: Managed Care, Other (non HMO)

## 2019-10-17 ENCOUNTER — Ambulatory Visit: Payer: Self-pay

## 2019-10-17 VITALS — BP 105/72 | HR 85 | Ht 73.0 in | Wt 295.0 lb

## 2019-10-17 DIAGNOSIS — M25512 Pain in left shoulder: Secondary | ICD-10-CM

## 2019-10-17 DIAGNOSIS — M25522 Pain in left elbow: Secondary | ICD-10-CM

## 2019-10-17 DIAGNOSIS — Z23 Encounter for immunization: Secondary | ICD-10-CM | POA: Diagnosis not present

## 2019-10-17 MED ORDER — PREDNISONE 20 MG PO TABS: ORAL_TABLET | ORAL | 0 refills | Status: DC

## 2019-10-17 NOTE — Patient Instructions (Signed)
Good to see you Please complete the prednisone.  You may continue with one pill of rayos after you complete the prednisone if you are still having pain.  Please let me know if your pain isn't improved   Please send me a message in MyChart with any questions or updates.  Please see me back in 4 weeks.   --Dr. Raeford Razor

## 2019-10-17 NOTE — Progress Notes (Signed)
Robert Cruz - 43 y.o. male MRN [id number]  Date of birth: [birth year]  SUBJECTIVE:  Including CC & ROS.  Chief Complaint  Patient presents with  . Shoulder Pain    left shoulder x 1 week    [name] is a 43 y.o. male that is presenting with acute left shoulder and elbow pain.  The pain is been ongoing for about 1 week.  He was seen at the urgent care and provided intramuscular Toradol and Depo-Medrol.  Yesterday he had significant improvement of his pain but his symptoms have returned today.  He does have a history of gout and only previously having in the great toe on the right side.  He denies inciting event or trauma.  The pain is worse with any movement or lifting.  He has lack of extension in the left elbow.  No fever or chills.  No redness.  No prior surgery on the elbow or shoulder.  The pain is severe and sharp and throbbing.   Review of Systems  Constitutional: Negative for fever.  HENT: Negative for congestion.   Respiratory: Negative for cough.   Cardiovascular: Negative for chest pain.  Gastrointestinal: Negative for abdominal distention.  Musculoskeletal: Positive for joint swelling.  Skin: Negative for color change.  Neurological: Negative for weakness.  Hematological: Negative for adenopathy.    HISTORY: Past Medical, Surgical, Social, and Family History Reviewed & Updated per EMR.   Pertinent Historical Findings include:  Past Medical History:  Diagnosis Date  . Chicken pox   . GERD (gastroesophageal reflux disease)   . Hypertension   . Morbid obesity ([place])   . Sleep apnea     No past surgical history on file.  Allergies  Allergen Reactions  . Amlodipine Other (See Comments)    edema  . Bystolic [Nebivolol Hcl] Other (See Comments)    Patient stated medication gave him major headaches    Family History  Problem Relation Age of Onset  . Hypertension Father   . Depression Father   . Hypertension Mother   . Kidney disease Maternal Grandmother         Dialysis  . Hypertension Maternal Grandmother   . Healthy Brother        x1  . Healthy Sister        x3  . Colon cancer Neg Hx   . Esophageal cancer Neg Hx   . Rectal cancer Neg Hx   . Stomach cancer Neg Hx      Social History   Socioeconomic History  . Marital status: Significant Other    Spouse name: Not on file  . Number of children: 5  . Years of education: 47  . Highest education level: Not on file  Occupational History  . Occupation: Psychologist, occupational: [employer]  . Financial resource strain: Not on file  . Food insecurity    Worry: Not on file    Inability: Not on file  . Transportation needs    Medical: Not on file    Non-medical: Not on file  Tobacco Use  . Smoking status: Never Smoker  . Smokeless tobacco: Never Used  Substance and Sexual Activity  . Alcohol use: No  . Drug use: No  . Sexual activity: Not on file  Lifestyle  . Physical activity    Days per week: Not on file    Minutes per session: Not on file  . Stress: Not on file  Relationships  .  Social Musician on phone: Not on file    Gets together: Not on file    Attends religious service: Not on file    Active member of club or organization: Not on file    Attends meetings of clubs or organizations: Not on file    Relationship status: Not on file  . Intimate partner violence    Fear of current or ex partner: Not on file    Emotionally abused: Not on file    Physically abused: Not on file    Forced sexual activity: Not on file  Other Topics Concern  . Not on file  Social History Narrative  . Not on file     PHYSICAL EXAM:  VS: BP 105/72   Pulse 85   Ht 6\' 1"  (1.854 m)   Wt 295 lb (133.8 kg)   BMI 38.92 kg/m  Physical Exam Gen: NAD, alert, cooperative with exam, well-appearing ENT: normal lips, normal nasal mucosa,  Eye: normal EOM, normal conjunctiva and lids CV:  no edema, +2 pedal pulses   Resp: no accessory muscle use, non-labored,   Skin: no rashes, no areas of induration  Neuro: normal tone, normal sensation to touch Psych:  normal insight, alert and oriented MSK:  Left shoulder: Normal active flexion and abduction. Normal internal and external rotation. Pain with external rotation and abduction. Normal empty can testing. Negative speeds test. Left elbow: No redness or swelling. Tenderness to palpation over the joint line. Lack of full extension. Pain with resistance to supination and pronation. Normal grip strength. No signs of atrophy. Neurovascularly intact  Limited ultrasound: Left shoulder/left elbow:  Left shoulder: Biceps tendon with no significant effusion but does have increased hyperemia when compared to the contralateral side. Normal-appearing subscapularis. Normal-appearing supraspinatus. No obvious effusion in the posterior glenohumeral joint. Axillary view would suggest hyperechoic debris to suggest crystal.  There also appears to be more of an effusion.  Left elbow: Normal-appearing origin of the common extensors at the lateral epicondyle with no increased hyperemia. Mild effusion noted in the elbow joint itself.   Summary: Findings in the shoulder and elbow would suggest pain is related to the capsule with underlying gouty infiltration.  Ultrasound and interpretation by , MD    ASSESSMENT & PLAN:   Acute pain of left shoulder Seems to be related to the capsule. Had improvement with IM Toradol and Depo.  Pain seems to be returning.  No injury or trauma.  Seems like there is an effusion as well as crystal deposition within capsule.  Avoiding anti-inflammatories and colchicine due to elevated creatinine. -Prednisone and provided Rayos samples. -Counseled on supportive care. -If no improvement will consider injection. -Would obtain a uric acid at some point.  Left elbow pain Has lack of extension and appears to have an effusion on ultrasound.  Does have a history of  gout and low likelihood for infection. -Prednisone. -May need to consider injection. -Could consider x-ray

## 2019-10-18 DIAGNOSIS — M25522 Pain in left elbow: Secondary | ICD-10-CM | POA: Insufficient documentation

## 2019-10-18 DIAGNOSIS — M25512 Pain in left shoulder: Secondary | ICD-10-CM | POA: Insufficient documentation

## 2019-10-18 NOTE — Assessment & Plan Note (Signed)
Has lack of extension and appears to have an effusion on ultrasound.  Does have a history of gout and low likelihood for infection. -Prednisone. -May need to consider injection. -Could consider x-ray

## 2019-10-18 NOTE — Assessment & Plan Note (Signed)
Seems to be related to the capsule. Had improvement with IM Toradol and Depo.  Pain seems to be returning.  No injury or trauma.  Seems like there is an effusion as well as crystal deposition within capsule.  Avoiding anti-inflammatories and colchicine due to elevated creatinine. -Prednisone and provided Rayos samples. -Counseled on supportive care. -If no improvement will consider injection. -Would obtain a uric acid at some point.

## 2020-02-21 ENCOUNTER — Other Ambulatory Visit: Payer: Self-pay | Admitting: Cardiology

## 2020-02-21 ENCOUNTER — Other Ambulatory Visit: Payer: Self-pay | Admitting: Cardiovascular Disease

## 2020-02-25 ENCOUNTER — Ambulatory Visit (INDEPENDENT_AMBULATORY_CARE_PROVIDER_SITE_OTHER): Payer: Managed Care, Other (non HMO) | Admitting: Cardiovascular Disease

## 2020-02-25 ENCOUNTER — Other Ambulatory Visit: Payer: Self-pay

## 2020-02-25 ENCOUNTER — Encounter: Payer: Self-pay | Admitting: Cardiovascular Disease

## 2020-02-25 VITALS — BP 104/70 | HR 83 | Ht 73.0 in | Wt 309.2 lb

## 2020-02-25 DIAGNOSIS — E782 Mixed hyperlipidemia: Secondary | ICD-10-CM | POA: Diagnosis not present

## 2020-02-25 DIAGNOSIS — I10 Essential (primary) hypertension: Secondary | ICD-10-CM

## 2020-02-25 NOTE — Progress Notes (Signed)
Cardiology Office Note   Date:  02/25/2020   ID:  Robert Cruz, DOB March 28, 1976, MRN [id number]  PCP:  Marrian Salvage, [place]  Cardiologist:   Mertie Moores, MD   Chief Complaint  Patient presents with  . Hypertension   Problem List 1. Essential HTN 2. Obstructive sleep apnea 3, GERD    Previous notes:  Robert Cruz is a 44 y.o. male who presents for evaluation of his HTN He has been on multiple meds with minimal response  Very busy at work .    Not a lot of time to exercise  Has been on a better diet for the past 1 year  or so.  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 Works 6 am - ~ 5 or 6 pm  No CP or dyspnea.  No DOE doing fairly moderate exercise ( ie no dyspnea walking up the stadium steps at the [employer] a month ago )  Has not lost any weight on his new diet.   Typically break fast - eggs, [place] sausage /turkey bacon  Lunch - subway - 6 inch or salad   - used to eat lots of fried foods, [employer], chick fil a  Dinner - spag. Occasionally take out from [employer] [place]   Apr 05, 2016:  Robert Cruz is seen back today for follow up of his HTN Echo shows normal LV function with moderate LAE  Has tried clonidine -  Was not effective so it was stopped  Hydralazine was substituted.  BP is still high  Has tried Bystolic - caused a severe headache Still drinking lots of sweet tea Lots of juices. Still eats out quite a bit .  Weight is 343 today .  I advised him to lose 5-10 lbs before his next visit .   May 09, 2016  weight is 341 today  BP is elevated.  Has OSA - has been using CPAP , may need some adjustment on the mask gasket  Has been working lots - worked 24 straight days  Avoiding salt    Oct. 18, 2017:  Robert Cruz is seen back for office visit for HTN Has done well on the Aldactone Feeling better.  Headaches have resolved,   Leg edema has resolved.   09/08/2017 Works now for [employer].   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 Still drinking sweet tea.   Wt = 334 ( down 6 lbs from last year)  Having some abd. Pain after eating  Symptoms sound like esophageal stricture /   hiatal hernia / or  GERD  Has had esophageal dilitation in the past.  Has cut out greasy foods.   Does eat some bacon on occasion.   September 26, 2018:  Robert Cruz is seen today for follow-up visit.  He has a history of morbid obesity, hypertension, hyperlipidemia.  His weight today is 345 pounds 11 pounds from last year. Trying to avoid salt .  Is not drinking sweat tea .  Will be seeing a nutritiionist this week .  No CP or dyspnea.  Working for [employer]   February 25, 2020:   Doing well Wt is down 35 lbs from 2019 Is exercising  Some .  Is married  Past Medical History:  Diagnosis Date  . Chicken pox   . GERD (gastroesophageal reflux disease)   . Hypertension   .  Morbid obesity (HCC)   . Sleep apnea     History reviewed. No pertinent surgical history.   Current Outpatient Medications  Medication Sig Dispense Refill  . atenolol (TENORMIN) 100 MG tablet Take 1 tablet (100 mg total) by mouth daily. 90 tablet 3  . hydrochlorothiazide (HYDRODIURIL) 25 MG tablet Take 1 tablet (25 mg total) by mouth daily. Please keep upcoming appt for refills. Thank you 30 tablet 0  . lisinopril (ZESTRIL) 20 MG tablet Take 1 tablet (20 mg total) by mouth daily. Please keep upcoming appt for refills. Thank you 30 tablet 0  . spironolactone (ALDACTONE) 100 MG tablet Take 100 mg by mouth daily.     No current facility-administered medications for this visit.    Allergies:   Amlodipine and Bystolic [nebivolol hcl]    Social History:  The patient  reports that he has never smoked. He has never used smokeless tobacco. He reports that he does not drink alcohol or use drugs.   Family History:  The patient's family history includes Depression in his father; Healthy in his  brother and sister; Hypertension in his father, maternal grandmother, and mother; Kidney disease in his maternal grandmother.    ROS:  Please see the history of present illness.     Physical Exam: Blood pressure 104/70, pulse 83, height 6\' 1"  (1.854 m), weight (!) 309 lb 4 oz (140.3 kg), SpO2 96 %.  GEN:   Morbidly obese, very pleasant gentleman, no acute distress HEENT: Normal NECK: No JVD; No carotid bruits LYMPHATICS: No lymphadenopathy CARDIAC: RRR , no murmurs, rubs, gallops RESPIRATORY:  Clear to auscultation without rales, wheezing or rhonchi  ABDOMEN: Soft, non-tender, non-distended MUSCULOSKELETAL:  No edema; No deformity  SKIN: Warm and dry NEUROLOGIC:  Alert and oriented x 3   EKG:     Recent Labs: 09/08/2019: ALT 13; BUN 17; Creatinine, Ser 1.44; Hemoglobin 11.4; Platelets 380; Potassium 3.9; Sodium 136    Lipid Panel    Component Value Date/Time   CHOL 160 07/13/2018 0931   CHOL 167 09/08/2017 0921   TRIG 123.0 07/13/2018 0931   HDL 31.90 (L) 07/13/2018 0931   HDL 34 (L) 09/08/2017 0921   CHOLHDL 5 07/13/2018 0931   VLDL 24.6 07/13/2018 0931   LDLCALC 104 (H) 07/13/2018 0931   LDLCALC 110 (H) 09/08/2017 0921      Wt Readings from Last 3 Encounters:  02/25/20 (!) 309 lb 4 oz (140.3 kg)  10/17/19 295 lb (133.8 kg)  09/08/19 300 lb (136.1 kg)      Other studies Reviewed: Additional studies/ records that were reviewed today include: . Review of the above records demonstrates:    ASSESSMENT AND PLAN:  1.  Essential HTN-  .    BP is much better.  Cont diet, exercise.   Advised low carb diet.  He has already reduced his Aldactone to 100 mg a day.  I will have him see 11/08/19, PA in 6 months.  I will plan on seeing him again in 1 year.  2. Obesity :    He is lost approximately 35 pounds since I saw him a year and a half ago. He still eats too much bread. We had a long discussion about low carbohydrate diet diets. When he goes to a sandwich shop he  will take off at least 1 piece of bread and perhaps both pieces of bread. I have encouraged him to exercise as much as possible.    2. Leg edema -  Has resolved.      3. Hyperlipidemia:  Mildly elevated.  Will check labs today  Mildly elevated, encouraged weight loss  4. Obstructive sleep apnea     Current medicines are reviewed at length with the patient today.  The patient does not have concerns regarding medicines.  The following changes have been made:  We'll discontinue metoprolol and try carvedilol 25 mg twice a day.  Labs/ tests ordered today include:   Orders Placed This Encounter  Procedures  . Lipid Profile  . Basic Metabolic Panel (BMET)  . Hepatic function panel    Disposition:       Kristeen Miss, MD  02/25/2020 5:25 PM    [facility] [address] [place], [place], [place]  [address] Phone: [phone]; Fax: [phone]

## 2020-02-25 NOTE — Patient Instructions (Signed)
Medication Instructions:  Your physician recommends that you continue on your current medications as directed. Please refer to the Current Medication list given to you today.  *If you need a refill on your cardiac medications before your next appointment, please call your pharmacy*  Lab Work: Your physician recommends that you return for lab work in: 6 months on the day of or a few days before your office visit. You will need to FAST for this appointment - nothing to eat or drink after midnight the night before except water.  If you have labs (blood work) drawn today and your tests are completely normal, you will receive your results only by: . MyChart Message (if you have MyChart) OR . A paper copy in the mail If you have any lab test that is abnormal or we need to change your treatment, we will call you to review the results.   Testing/Procedures: None Ordered   Follow-Up: At [facility], you and your health needs are our priority.  As part of our continuing mission to provide you with exceptional heart care, we have created designated Provider Care Teams.  These Care Teams include your primary Cardiologist (physician) and Advanced Practice Providers (APPs -  Physician Assistants and Nurse Practitioners) who all work together to provide you with the care you need, when you need it.  Your next appointment:   6 month(s)  The format for your next appointment:   In Person  Provider:   You may see Philip Nahser, MD or one of the following Advanced Practice Providers on your designated Care Team:    Scott Weaver, PA-C  Vin Bhagat, PA-C  Janine Hammond, NP    

## 2020-03-26 ENCOUNTER — Other Ambulatory Visit: Payer: Self-pay

## 2020-03-26 MED ORDER — ATENOLOL 100 MG PO TABS: 100.0000 mg | ORAL_TABLET | Freq: Every day | ORAL | 3 refills | Status: DC

## 2020-03-26 MED ORDER — LISINOPRIL 20 MG PO TABS: 20.0000 mg | ORAL_TABLET | Freq: Every day | ORAL | 3 refills | Status: DC

## 2020-03-26 MED ORDER — HYDROCHLOROTHIAZIDE 25 MG PO TABS: 25.0000 mg | ORAL_TABLET | Freq: Every day | ORAL | 3 refills | Status: DC

## 2020-03-26 MED ORDER — SPIRONOLACTONE 100 MG PO TABS: 100.0000 mg | ORAL_TABLET | Freq: Every day | ORAL | 3 refills | Status: DC

## 2020-03-26 NOTE — Telephone Encounter (Signed)
Pt's medications were sent to pt's pharmacy as requested. Confirmation received.  

## 2020-03-29 ENCOUNTER — Encounter: Payer: Self-pay | Admitting: Family

## 2020-04-10 ENCOUNTER — Other Ambulatory Visit: Payer: Self-pay

## 2020-04-10 ENCOUNTER — Ambulatory Visit (INDEPENDENT_AMBULATORY_CARE_PROVIDER_SITE_OTHER): Payer: Managed Care, Other (non HMO) | Admitting: Family

## 2020-04-10 VITALS — BP 112/78 | HR 84 | Temp 98.3°F | Ht 73.0 in | Wt 308.6 lb

## 2020-04-10 DIAGNOSIS — G459 Transient cerebral ischemic attack, unspecified: Secondary | ICD-10-CM

## 2020-04-10 MED ORDER — CLOPIDOGREL BISULFATE 75 MG PO TABS: 75.0000 mg | ORAL_TABLET | Freq: Every day | ORAL | 1 refills | Status: DC

## 2020-04-10 NOTE — Progress Notes (Signed)
Robert Cruz is a 44 y.o. male with the following history as recorded in EpicCare:  Patient Active Problem List   Diagnosis Date Noted  . Acute pain of left shoulder 10/18/2019  . Left elbow pain 10/18/2019  . Cough 10/03/2019  . Mixed hyperlipidemia 09/08/2017  . Allergic rhinitis 10/11/2016  . Sore throat 10/11/2016  . Chronic migraine without aura without status migrainosus, not intractable 01/14/2016  . 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 Generalized headache 12/16/2015  . Encounter for preventive health examination 09/06/2013  . GERD (gastroesophageal reflux disease) 09/06/2013  . DYSPHAGIA UNSPECIFIED 02/08/2011  . OSA (obstructive sleep apnea) 12/28/2008  . Severe obesity (BMI >= 40) (HCC) 10/07/2008  . Essential hypertension 08/07/2007    Current Outpatient Medications  Medication Sig Dispense Refill  . atenolol (TENORMIN) 100 MG tablet Take 1 tablet (100 mg total) by mouth daily. 90 tablet 3  . clopidogrel (PLAVIX) 75 MG tablet Take 1 tablet (75 mg total) by mouth daily. 30 tablet 1  . hydrochlorothiazide (HYDRODIURIL) 25 MG tablet Take 1 tablet (25 mg total) by mouth daily. 90 tablet 3  . lisinopril (ZESTRIL) 20 MG tablet Take 1 tablet (20 mg total) by mouth daily. 90 tablet 3  . spironolactone (ALDACTONE) 100 MG tablet Take 1 tablet (100 mg total) by mouth daily. 90 tablet 3   No current facility-administered medications for this visit.    Allergies: Amlodipine and Bystolic [nebivolol hcl]  Past Medical History:  Diagnosis Date  . Chicken pox   . GERD (gastroesophageal reflux disease)   . Hypertension   . Morbid obesity (HCC)   . Sleep apnea     No past surgical history on file.  Family History  Problem Relation Age of Onset  . Hypertension Father   . Depression Father   . Hypertension Mother   . Kidney disease Maternal Grandmother        Dialysis  . Hypertension Maternal Grandmother   . Healthy Brother        x1  . Healthy Sister        x3  .  Colon cancer Neg Hx   . Esophageal cancer Neg Hx   . Rectal cancer Neg Hx   . Stomach cancer Neg Hx     Social History   Tobacco Use  . Smoking status: Never Smoker  . Smokeless tobacco: Never Used  Substance Use Topics  . Alcohol use: No    Subjective:  Accompanied by his wife; notes that while on vacation at end of April he went to the ER with complaints of tingling and concerns that he was having a stroke; was kept in the hospital for 48 hours; per patient, "all my tests were normal" but he was discharged on Plavix; was told to stay on Plavix for at least 30 days until he could be seen by his PCP; there are no labs or imaging available for review today;   Objective:  Vitals:   04/10/20 1614  BP: 112/78  Pulse: 84  Temp: 98.3 F (36.8 C)  TempSrc: Oral  SpO2: 97%  Weight: (!) 308 lb 9.6 oz (140 kg)  Height: 6\' 1"  (1.854 m)    General: Well developed, well nourished, in no acute distress  Skin : Warm and dry.  Head: Normocephalic and atraumatic  Eyes: Sclera and conjunctiva clear; pupils round and reactive to light; extraocular movements intact  Ears: External normal; canals clear; tympanic membranes normal  Oropharynx: Pink, supple. No suspicious lesions  Neck: Supple  without thyromegaly, adenopathy  Lungs: Respirations unlabored; clear to auscultation bilaterally without wheeze, rales, rhonchi  CVS exam: normal rate and regular rhythm.  Neurologic: Alert and oriented; speech intact; face symmetrical; moves all extremities well; CNII-XII intact without focal deficit   Assessment:  1. TIA (transient ischemic attack)     Plan:  Will keep patient on Plavix at this time; explained to patient that do not normally start patients on this type of medication unless there was some type of abnormality; will send records release to get copies of discharge summary/ imaging and better determine necessary follow-up; in the interim, stay on Plavix and refer to neurology for possible  second opinion.  This visit occurred during the SARS-CoV-2 public health emergency.  Safety protocols were in place, including screening questions prior to the visit, additional usage of staff PPE, and extensive cleaning of exam room while observing appropriate contact time as indicated for disinfecting solutions.     No follow-ups on file.  Orders Placed This Encounter  Procedures  . Ambulatory referral to Neurology    Referral Priority:   Routine    Referral Type:   Consultation    Referral Reason:   Specialty Services Required    Requested Specialty:   Neurology    Number of Visits Requested:   1    Requested Prescriptions   Signed Prescriptions Disp Refills  . clopidogrel (PLAVIX) 75 MG tablet 30 tablet 1    Sig: Take 1 tablet (75 mg total) by mouth daily.

## 2020-04-13 ENCOUNTER — Encounter: Payer: Self-pay | Admitting: Neurology

## 2020-05-01 ENCOUNTER — Other Ambulatory Visit: Payer: Self-pay | Admitting: Family

## 2020-05-01 ENCOUNTER — Telehealth: Payer: Self-pay | Admitting: Family

## 2020-05-01 MED ORDER — ATORVASTATIN CALCIUM 40 MG PO TABS
40.0000 mg | ORAL_TABLET | Freq: Every day | ORAL | 1 refills | Status: DC
Start: 2020-05-01 — End: 2020-09-04

## 2020-05-01 NOTE — Telephone Encounter (Signed)
We did get his notes from the hospital at [facility]; his discharge diagnosis was TIA ( mini-stroke)- he did not have a stroke; however, he was discharged on Aspirin 81 mg daily, Plavix 75 mg and Lipitor 40 mg daily;  He is pre-diabetic with a Hgba1c of 6.3;  So, he will need to have a follow-up with neurologist here as discussed at his office visit with me. I will update the prescriptions for Lipitor as well as Plavix.  He needs OV here in about 4 months to check on his blood sugar.

## 2020-05-06 NOTE — Telephone Encounter (Signed)
Spoke with patient today. 

## 2020-06-29 ENCOUNTER — Other Ambulatory Visit: Payer: Self-pay | Admitting: Family

## 2020-07-02 NOTE — Progress Notes (Signed)
NEUROLOGY CONSULTATION NOTE  Robert Cruz MRN: [id number] DOB: 09-04-1976  Referring provider: Olive Bass, FNP Primary care provider: Olive Bass, FNP  Reason for consult:  TIA  HISTORY OF PRESENT ILLNESS: Robert Cruz is a 44 year old right-handed male with HTN and sleep apnea who presents for TIA.  History supplemented by hospital and referring provider's notes.  He is accompanied by his wife who supplements history.  Brown liquor laying down.  Get up and still feeling it. 30 minute Goes to hospital.  Anxiety then.  Then he felt left side was numb.    While on vacation in [place] on 03/30/2020, he developed transient right-sided numbness and tingling involving the left arm and leg.  He was laying down and when he stood up, it did not resolve.  There was no associated headache, facial droop, extremity weakness or slurred speech.  He went to [facility] where he developed left arm and leg numbness as well and he was admitted for stroke workup. Symptoms lasted a couple of hours.  CT head which was motion-limited, showed no acute intracranial abnormality.  MRI of brain also showed no acute intracranial abnormality.  Carotid doppler revealed no hemodynamically significant stenosis.  Hgb A1c was 6.3.  LDL was 104.  He was started on ASA 81mg  and Plavix 75mg  daily and Lipitor 40mg  daily.  In hindsight, he questions if he actually had a TIA.  He said he had been drinking brown liquor and was a little intoxicated and when he developed the symptoms, he started having anxiety.  For the past month, he started having a slight headache on the top of his head, lasting 30 minutes and occurring 1 to 2 times a week.  It is manageable.  A few years ago, he had daily headaches related to uncontrolled blood pressure and medication overuse (daily Goody) but otherwise no other history of headache.  Blood pressure has been controlled.  Current medications:   ASA 81mg , Plavix 75mg  daily, atorvastatin 40mg  daily, lisinopril 20mg  daily, HCTZ, atenolol 100mg  daily.  PAST MEDICAL HISTORY: Past Medical History:  Diagnosis Date  . Chicken pox   . GERD (gastroesophageal reflux disease)   . Hypertension   . Morbid obesity (HCC)   . Sleep apnea     PAST SURGICAL HISTORY: No past surgical history on file.  MEDICATIONS: Current Outpatient Medications on File Prior to Visit  Medication Sig Dispense Refill  . atenolol (TENORMIN) 100 MG tablet Take 1 tablet (100 mg total) by mouth daily. 90 tablet 3  . atorvastatin (LIPITOR) 40 MG tablet Take 1 tablet (40 mg total) by mouth daily. 90 tablet 1  . clopidogrel (PLAVIX) 75 MG tablet Take 1 tablet by mouth once daily 30 tablet 0  . hydrochlorothiazide (HYDRODIURIL) 25 MG tablet Take 1 tablet (25 mg total) by mouth daily. 90 tablet 3  . lisinopril (ZESTRIL) 20 MG tablet Take 1 tablet (20 mg total) by mouth daily. 90 tablet 3  . spironolactone (ALDACTONE) 100 MG tablet Take 1 tablet (100 mg total) by mouth daily. 90 tablet 3   No current facility-administered medications on file prior to visit.    ALLERGIES: Allergies  Allergen Reactions  . Amlodipine Other (See Comments)    edema  . Bystolic [Nebivolol Hcl] Other (See Comments)    Patient stated medication gave him major headaches    FAMILY HISTORY: Family History  Problem Relation Age of Onset  . Hypertension Father   .  Depression Father   . Hypertension Mother   . Kidney disease Maternal Grandmother        Dialysis  . Hypertension Maternal Grandmother   . Healthy Brother        x1  . Healthy Sister        x3  . Colon cancer Neg Hx   . Esophageal cancer Neg Hx   . Rectal cancer Neg Hx   . Stomach cancer Neg Hx     SOCIAL HISTORY: Social History   Socioeconomic History  . Marital status: Significant Other    Spouse name: Not on file  . Number of children: 5  . Years of education: 70  . Highest education level: Not on file    Occupational History  . Occupation: Material Buyer, retail: [employer]  Tobacco Use  . Smoking status: Never Smoker  . Smokeless tobacco: Never Used  Vaping Use  . Vaping Use: Never used  Substance and Sexual Activity  . Alcohol use: No  . Drug use: No  . Sexual activity: Not on file  Other Topics Concern  . Not on file  Social History Narrative  . Not on file   Social Determinants of Health   Financial Resource Strain:   . Difficulty of Paying Living Expenses:   Food Insecurity:   . Worried About Programme researcher, broadcasting/film/video in the Last Year:   . Barista in the Last Year:   Transportation Needs:   . Freight forwarder (Medical):   Marland Kitchen Lack of Transportation (Non-Medical):   Physical Activity:   . Days of Exercise per Week:   . Minutes of Exercise per Session:   Stress:   . Feeling of Stress :   Social Connections:   . Frequency of Communication with Friends and Family:   . Frequency of Social Gatherings with Friends and Family:   . Attends Religious Services:   . Active Member of Clubs or Organizations:   . Attends Banker Meetings:   Marland Kitchen Marital Status:   Intimate Partner Violence:   . Fear of Current or Ex-Partner:   . Emotionally Abused:   Marland Kitchen Physically Abused:   . Sexually Abused:     PHYSICAL EXAM: Blood pressure 113/75, pulse 70, height 6\' 1"  (1.854 m), weight (!) 313 lb (142 kg), SpO2 98 %. General: No acute distress.  Patient appears well-groomed.  Head:  Normocephalic/atraumatic Eyes:  fundi examined but not visualized Neck: supple, no paraspinal tenderness, full range of motion Back: No paraspinal tenderness Heart: regular rate and rhythm Lungs: Clear to auscultation bilaterally. Vascular: No carotid bruits. Neurological Exam: Mental status: alert and oriented to person, place, and time, recent and remote memory intact, fund of knowledge intact, attention and concentration intact, speech fluent and not dysarthric, language  intact. Cranial nerves: CN I: not tested CN II: pupils equal, round and reactive to light, visual fields intact CN III, IV, VI:  full range of motion, no nystagmus, no ptosis CN V: facial sensation intact CN VII: upper and lower face symmetric CN VIII: hearing intact CN IX, X: gag intact, uvula midline CN XI: sternocleidomastoid and trapezius muscles intact CN XII: tongue midline Bulk & Tone: normal, no fasciculations. Motor:  5/5 throughout  Sensation:  Pinprick and vibration sensation intact. Deep Tendon Reflexes:  2+ throughout, toes downgoing.  Finger to nose testing:  Without dysmetria.  Heel to shin:  Without dysmetria.  Gait:  Normal station and stride.  Able to  turn and tandem walk. Romberg negative.  IMPRESSION: 1.  Transient numbness and tingling.  Etiology unclear.  It would be unusual to develop bilateral sensory deficits with no other symptoms with stroke.  He may have had a left hemisphere TIA and the right sided symptoms were related to anxiety.  Nobody really knows but we should treat for possible TIA as there is no other clear explanation. 2.  Hypertension, controlled.  PLAN: 1.  May discontinue Plavix 75mg  daily and continue ASA 81mg  daily (dual antiplatelet therapy only indicated for 3 weeks after TIA). 2.  Lipitor 40mg  daily (LDL goal less than 70) 3.  Blood pressure control 4.  Glycemic control (Hgb A1c less than 7) 5.  Will complete stroke workup:  -  2D echo with bubble study  -   CTA of head 6.  Follow up in 6 months.  Thank you for allowing me to take part in the care of this patient.  , DO  CC: , FNP

## 2020-07-03 ENCOUNTER — Telehealth: Payer: Self-pay

## 2020-07-03 ENCOUNTER — Ambulatory Visit (INDEPENDENT_AMBULATORY_CARE_PROVIDER_SITE_OTHER): Payer: Managed Care, Other (non HMO) | Admitting: Neurology

## 2020-07-03 ENCOUNTER — Other Ambulatory Visit: Payer: Self-pay

## 2020-07-03 ENCOUNTER — Encounter: Payer: Self-pay | Admitting: Neurology

## 2020-07-03 VITALS — BP 113/75 | HR 70 | Ht 73.0 in | Wt 313.0 lb

## 2020-07-03 DIAGNOSIS — G459 Transient cerebral ischemic attack, unspecified: Secondary | ICD-10-CM | POA: Diagnosis not present

## 2020-07-03 NOTE — Telephone Encounter (Signed)
Called patient and informed him that Dr. Everlena Cooper would like patient to have an Echocardiogram with bubble. Informed patient that after PA is approved someone will call him to schedule an appt. Patient agreed and voiced understanding

## 2020-07-03 NOTE — Patient Instructions (Addendum)
Unclear if you had a TIA but we must treat just in case.  1.  Continue aspirin 81mg  daily but you may stop Plavix 2.  Continue atorvastatin and blood pressure control 3.  Will check CTA of head.We have sent a referral to [facility] for your CTA and they will call you directly to schedule your appointment. They are located at [address] [facility]. If you need to contact them directly please call [phone].    4.  Follow up in 6 months.

## 2020-07-10 NOTE — Progress Notes (Signed)
ID # [id number] REF CODE [id number] AUTHORIZATION NUMBER [id number] COVERAGE DATES 07/03/20 - 10/01/2020

## 2020-07-16 ENCOUNTER — Ambulatory Visit
Admission: RE | Admit: 2020-07-16 | Discharge: 2020-07-16 | Disposition: A | Discharge status: Home / Self Care | Payer: Managed Care, Other (non HMO) | Source: Ambulatory Visit | Attending: Neurology | Admitting: Neurology

## 2020-07-16 DIAGNOSIS — G459 Transient cerebral ischemic attack, unspecified: Secondary | ICD-10-CM

## 2020-07-16 MED ORDER — IOPAMIDOL (ISOVUE-370) INJECTION 76%
75.0000 mL | Freq: Once | INTRAVENOUS | Status: AC | PRN
  Administered 2020-07-16: 75 mL via INTRAVENOUS

## 2020-07-28 ENCOUNTER — Other Ambulatory Visit: Payer: Self-pay

## 2020-07-28 ENCOUNTER — Ambulatory Visit (HOSPITAL_COMMUNITY): Payer: Managed Care, Other (non HMO) | Attending: Neurology

## 2020-07-28 DIAGNOSIS — G459 Transient cerebral ischemic attack, unspecified: Secondary | ICD-10-CM

## 2020-07-28 LAB — ECHOCARDIOGRAM COMPLETE BUBBLE STUDY
Area-P 1/2: 3.54 cm2
S' Lateral: 2.6 cm

## 2020-07-28 MED ORDER — SODIUM CHLORIDE 0.9% FLUSH
10.0000 mL | INTRAVENOUS | Status: AC | PRN
  Administered 2020-07-28: 10 mL via INTRAVENOUS

## 2020-07-29 ENCOUNTER — Telehealth: Payer: Self-pay

## 2020-07-29 NOTE — Telephone Encounter (Signed)
-----   Message from Drema Dallas, DO sent at 07/28/2020  4:45 PM EDT ----- Echocardiogram shows no abnormalities that would contribute to a TIA/stroke

## 2020-07-29 NOTE — Telephone Encounter (Signed)
Pt advised of Echocardiogram results.

## 2020-09-04 ENCOUNTER — Other Ambulatory Visit: Payer: Self-pay

## 2020-09-04 ENCOUNTER — Other Ambulatory Visit (INDEPENDENT_AMBULATORY_CARE_PROVIDER_SITE_OTHER): Payer: Managed Care, Other (non HMO)

## 2020-09-04 ENCOUNTER — Ambulatory Visit (INDEPENDENT_AMBULATORY_CARE_PROVIDER_SITE_OTHER): Payer: Managed Care, Other (non HMO) | Admitting: Family

## 2020-09-04 ENCOUNTER — Encounter: Payer: Self-pay | Admitting: Family

## 2020-09-04 VITALS — BP 142/80 | HR 78 | Temp 98.4°F | Ht 73.0 in | Wt 317.8 lb

## 2020-09-04 DIAGNOSIS — G459 Transient cerebral ischemic attack, unspecified: Secondary | ICD-10-CM

## 2020-09-04 DIAGNOSIS — E782 Mixed hyperlipidemia: Secondary | ICD-10-CM

## 2020-09-04 DIAGNOSIS — R7309 Other abnormal glucose: Secondary | ICD-10-CM

## 2020-09-04 DIAGNOSIS — Z23 Encounter for immunization: Secondary | ICD-10-CM

## 2020-09-04 DIAGNOSIS — I10 Essential (primary) hypertension: Secondary | ICD-10-CM | POA: Diagnosis not present

## 2020-09-04 LAB — COMPREHENSIVE METABOLIC PANEL
ALT: 13 U/L (ref 0–53)
AST: 16 U/L (ref 0–37)
Albumin: 4.3 g/dL (ref 3.5–5.2)
Alkaline Phosphatase: 56 U/L (ref 39–117)
BUN: 21 mg/dL (ref 6–23)
CO2: 25 mEq/L (ref 19–32)
Calcium: 9.5 mg/dL (ref 8.4–10.5)
Chloride: 103 mEq/L (ref 96–112)
Creatinine, Ser: 1.61 mg/dL — ABNORMAL HIGH (ref 0.40–1.50)
GFR: 56.52 mL/min — ABNORMAL LOW (ref >60.00)
Glucose, Bld: 83 mg/dL (ref 70–99)
Potassium: 4.8 mEq/L (ref 3.5–5.1)
Sodium: 135 mEq/L (ref 135–145)
Total Bilirubin: 0.5 mg/dL (ref 0.2–1.2)
Total Protein: 7.8 g/dL (ref 6.0–8.3)

## 2020-09-04 LAB — CBC WITH DIFFERENTIAL/PLATELET
Basophils Absolute: 0.1 10*3/uL (ref 0.0–0.1)
Basophils Relative: 0.6 % (ref 0.0–3.0)
Eosinophils Absolute: 0.5 10*3/uL (ref 0.0–0.7)
Eosinophils Relative: 5.4 % — ABNORMAL HIGH (ref 0.0–5.0)
HCT: 37.1 % — ABNORMAL LOW (ref 39.0–52.0)
Hemoglobin: 12.2 g/dL — ABNORMAL LOW (ref 13.0–17.0)
Lymphocytes Relative: 34.8 % (ref 12.0–46.0)
Lymphs Abs: 3.3 10*3/uL (ref 0.7–4.0)
MCHC: 33 g/dL (ref 30.0–36.0)
MCV: 83.7 fl (ref 78.0–100.0)
Monocytes Absolute: 0.6 10*3/uL (ref 0.1–1.0)
Monocytes Relative: 6.3 % (ref 3.0–12.0)
Neutro Abs: 5 10*3/uL (ref 1.4–7.7)
Neutrophils Relative %: 52.9 % (ref 43.0–77.0)
Platelets: 299 10*3/uL (ref 150.0–400.0)
RBC: 4.43 Mil/uL (ref 4.22–5.81)
RDW: 14.4 % (ref 11.5–15.5)
WBC: 9.5 10*3/uL (ref 4.0–10.5)

## 2020-09-04 LAB — LIPID PANEL
Cholesterol: 151 mg/dL (ref 0–200)
HDL: 36.2 mg/dL — ABNORMAL LOW (ref >39.00)
LDL Cholesterol: 99 mg/dL (ref 0–99)
NonHDL: 114.99
Total CHOL/HDL Ratio: 4
Triglycerides: 78 mg/dL (ref 0.0–149.0)
VLDL: 15.6 mg/dL (ref 0.0–40.0)

## 2020-09-04 LAB — HEMOGLOBIN A1C: Hgb A1c MFr Bld: 6.6 % — ABNORMAL HIGH (ref 4.6–6.5)

## 2020-09-04 MED ORDER — ASPIRIN EC 81 MG PO TBEC
81.0000 mg | DELAYED_RELEASE_TABLET | Freq: Every day | ORAL | 1 refills | Status: DC
Start: 2020-09-04 — End: 2021-06-03

## 2020-09-04 MED ORDER — ATORVASTATIN CALCIUM 40 MG PO TABS: 40.0000 mg | ORAL_TABLET | Freq: Every day | ORAL | 3 refills | Status: DC

## 2020-09-04 NOTE — Progress Notes (Signed)
Robert Cruz is a 44 y.o. male with the following history as recorded in Epic[facility]:  Patient Active Problem List   Diagnosis Date Noted  . Acute pain of left shoulder 10/18/2019  . Left elbow pain 10/18/2019  . Cough 10/03/2019  . Mixed hyperlipidemia 09/08/2017  . Allergic rhinitis 10/11/2016  . Sore throat 10/11/2016  . Chronic migraine without aura without status migrainosus, not intractable 01/14/2016  . 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 Generalized headache 12/16/2015  . Encounter for preventive health examination 09/06/2013  . GERD (gastroesophageal reflux disease) 09/06/2013  . DYSPHAGIA UNSPECIFIED 02/08/2011  . OSA (obstructive sleep apnea) 12/28/2008  . Severe obesity (BMI >= 40) ([place]) 10/07/2008  . Essential hypertension 08/07/2007    Current Outpatient Medications  Medication Sig Dispense Refill  . atenolol (TENORMIN) 100 MG tablet Take 1 tablet (100 mg total) by mouth daily. 90 tablet 3  . hydrochlorothiazide (HYDRODIURIL) 25 MG tablet Take 1 tablet (25 mg total) by mouth daily. 90 tablet 3  . lisinopril (ZESTRIL) 20 MG tablet Take 1 tablet (20 mg total) by mouth daily. 90 tablet 3  . spironolactone (ALDACTONE) 100 MG tablet Take 1 tablet (100 mg total) by mouth daily. 90 tablet 3  . aspirin EC 81 MG tablet Take 1 tablet (81 mg total) by mouth daily. Swallow whole. 90 tablet 1  . atorvastatin (LIPITOR) 40 MG tablet Take 1 tablet (40 mg total) by mouth daily. 90 tablet 3   No current facility-administered medications for this visit.    Allergies: Amlodipine and Bystolic [nebivolol hcl]  Past Medical History:  Diagnosis Date  . Chicken pox   . GERD (gastroesophageal reflux disease)   . Hypertension   . Morbid obesity ([place])   . Sleep apnea     History reviewed. No pertinent surgical history.  Family History  Problem Relation Age of Onset  . Hypertension Father   . Depression Father   . Hypertension Mother   . Kidney disease Maternal Grandmother         Dialysis  . Hypertension Maternal Grandmother   . Healthy Brother        x1  . Healthy Sister        x3  . Colon cancer Neg Hx   . Esophageal cancer Neg Hx   . Rectal cancer Neg Hx   . Stomach cancer Neg Hx     Social History   Tobacco Use  . Smoking status: Never Smoker  . Smokeless tobacco: Never Used  Substance Use Topics  . Alcohol use: No    Subjective:  Follow-up from suspect TIA; notes he did not understand to stay on Aspirin and Atorvastatin as recommended by myself and neurology; notes indicate that he was due to see cardiology last month- did not understand this recommendation; Agrees to flu shot today;     Objective:  Vitals:   09/04/20 1619  BP: (!) 142/80  Pulse: 78  Temp: 98.4 F (36.9 C)  TempSrc: Oral  SpO2: 97%  Weight: (!) 317 lb 12.8 oz (144.2 kg)  Height: '6\' 1"'  (1.854 m)    General: Well developed, well nourished, in no acute distress  Head: Normocephalic and atraumatic  Lungs: Respirations unlabored;  CVS exam: normal rate and regular rhythm.  Neurologic: Alert and oriented; speech intact; face symmetrical; moves all extremities well; CNII-XII intact without focal deficit   Assessment:  1. Mixed hyperlipidemia   2. Elevated glucose   3. Essential hypertension   4. TIA (transient ischemic attack)  5. Needs flu shot     Plan:  1. Re-start Atorvastatin 40 mg daily; stressed need to take daily; 2. Check Hgba1c today; 3. Follow-up with Dr. Cathie Olden to see when he is due for in office appointment. 4. Take aspirin 81 mg daily as previously recommended; keep planned follow-up with neurology; 5. Flu shot given;  This visit occurred during the SARS-CoV-2 public health emergency.  Safety protocols were in place, including screening questions prior to the visit, additional usage of staff PPE, and extensive cleaning of exam room while observing appropriate contact time as indicated for disinfecting solutions.     No follow-ups on file.  Orders  Placed This Encounter  Procedures  . Flu Vaccine QUAD 36+ mos IM  . Hemoglobin A1c    Standing Status:   Future    Number of Occurrences:   1    Standing Expiration Date:   09/04/2021  . Lipid panel    Standing Status:   Future    Number of Occurrences:   1    Standing Expiration Date:   09/04/2021  . Comp Met (CMET)    Standing Status:   Future    Number of Occurrences:   1    Standing Expiration Date:   09/04/2021  . CBC with Differential/Platelet    Standing Status:   Future    Number of Occurrences:   1    Standing Expiration Date:   09/04/2021    Requested Prescriptions   Signed Prescriptions Disp Refills  . atorvastatin (LIPITOR) 40 MG tablet 90 tablet 3    Sig: Take 1 tablet (40 mg total) by mouth daily.  Marland Kitchen aspirin EC 81 MG tablet 90 tablet 1    Sig: Take 1 tablet (81 mg total) by mouth daily. Swallow whole.

## 2020-09-09 ENCOUNTER — Ambulatory Visit: Payer: Self-pay

## 2020-09-09 ENCOUNTER — Ambulatory Visit (INDEPENDENT_AMBULATORY_CARE_PROVIDER_SITE_OTHER): Payer: Managed Care, Other (non HMO)

## 2020-09-09 ENCOUNTER — Encounter: Payer: Self-pay | Admitting: Family Medicine

## 2020-09-09 ENCOUNTER — Other Ambulatory Visit: Payer: Self-pay

## 2020-09-09 ENCOUNTER — Ambulatory Visit (INDEPENDENT_AMBULATORY_CARE_PROVIDER_SITE_OTHER): Payer: Managed Care, Other (non HMO) | Admitting: Family Medicine

## 2020-09-09 VITALS — BP 102/72 | HR 73 | Ht 73.0 in | Wt 318.6 lb

## 2020-09-09 DIAGNOSIS — M25511 Pain in right shoulder: Secondary | ICD-10-CM

## 2020-09-09 DIAGNOSIS — G8929 Other chronic pain: Secondary | ICD-10-CM | POA: Diagnosis not present

## 2020-09-09 NOTE — Patient Instructions (Signed)
Thank you for coming in today.  Call or go to the ER if you develop a large red swollen joint with extreme pain or oozing puss.   I've referred you to Physical Therapy.  Let us know if you don't hear from them in one week.  Please get an Xray today before you leave  Check back after 4-6 weeks of PT if not better

## 2020-09-09 NOTE — Progress Notes (Signed)
Subjective:    CC: R shoulder pain  I, Molly Weber, LAT, ATC, am serving as scribe for Dr. [name].  HPI: Pt is a 44 y/o male presenting w/ c/o chronic R shoulder pain that is progressively worsening over the last month.  He locates his pain to deep inside his shoulder .  Pain is worse with abduction and external rotation.  No injury.  Fortunately he is able to work without pain.  He delivers tires.  Radiating pain: No R shoulder mechanical symptoms: No Aggravating factors: R shoulder functional ER; brushing his hair Treatments tried: heat  Pertinent review of Systems: No fevers or chills  Relevant historical information: Hypertension sleep apnea obesity   Objective:    Vitals:   09/09/20 1606  BP: 102/72  Pulse: 73  SpO2: 97%   General: Well Developed, well nourished, and in no acute distress.   MSK: Right shoulder normal-appearing nontender. Normal motion. Intact strength abduction external/internal rotation.  Mildly positive Hawkins and Neer's test negative empty can test negative crossover arm compression test. Negative Yergason's and speeds test. Negative anterior apprehension test. Pulses cap refill and sensation are intact distally.  Lab and Radiology Results  X-ray images right shoulder obtained today personally and independently interpreted Mild glenohumeral DJD no significant AC DJD no fractures. Await formal radiology review  Procedure: Real-time Ultrasound Guided Injection of right shoulder subacromial bursa Device: Philips Affiniti 50G Images permanently stored and available for review in PACS Inspection of shoulder with ultrasound prior to injection reveals intact rotator cuff tendons with moderate subacromial bursitis and moderate AC DJD with effusion. Verbal informed consent obtained.  Discussed risks and benefits of procedure. Warned about infection bleeding damage to structures skin hypopigmentation and fat atrophy among others. Patient  expresses understanding and agreement Time-out conducted.   Noted no overlying erythema, induration, or other signs of local infection.   Skin prepped in a sterile fashion.   Local anesthesia: Topical Ethyl chloride.   With sterile technique and under real time ultrasound guidance:  40 mg of Kenalog and 2 mL of Marcaine injected into subacromial bursa. Fluid seen entering the bursa.   Completed without difficulty   Pain mostly resolved suggesting accurate placement of the medication.   Advised to call if fevers/chills, erythema, induration, drainage, or persistent bleeding.   Images permanently stored and available for review in the ultrasound unit.  Impression: Technically successful ultrasound guided injection.      Impression and Recommendations:    Assessment and Plan: 44 y.o. male with right shoulder pain largely due to subacromial impingement.  His pain pattern is a bit odd requiring abduction and external rotation.  Ultrasound did show subacromial bursitis changes.  Plan for injection today and physical therapy.  If not improving patient will notify me we will proceed with MRI for further evaluation in clinic.  PDMP not reviewed this encounter. Orders Placed This Encounter  Procedures  . [place] LIMITED JOINT SPACE STRUCTURES UP RIGHT(NO LINKED CHARGES)    Order Specific Question:   Reason for Exam (SYMPTOM  OR DIAGNOSIS REQUIRED)    Answer:   R shoulder pain    Order Specific Question:   Preferred imaging location?    Answer:   [facility]  . DG Shoulder Right    Standing Status:   Future    Number of Occurrences:   1    Standing Expiration Date:   09/09/2021    Order Specific Question:   Reason for Exam (SYMPTOM  OR DIAGNOSIS REQUIRED)    Answer:   eval shoulder    Order Specific Question:   Preferred imaging location?    Answer:   [facility]  . Ambulatory referral to Physical Therapy    Referral Priority:   Routine    Referral Type:    Physical Medicine    Referral Reason:   Specialty Services Required    Requested Specialty:   Physical Therapy   No orders of the defined types were placed in this encounter.   Discussed warning signs or symptoms. Please see discharge instructions. Patient expresses understanding.   The above documentation has been reviewed and is accurate and complete [name], M.D.

## 2020-09-10 NOTE — Progress Notes (Signed)
The little joint of the top of the shoulder has some arthritis in it.  This is the [facility] joint.  The main shoulder joint does not have arthritis.

## 2020-09-30 ENCOUNTER — Other Ambulatory Visit: Payer: Self-pay

## 2020-09-30 ENCOUNTER — Ambulatory Visit (INDEPENDENT_AMBULATORY_CARE_PROVIDER_SITE_OTHER): Payer: Managed Care, Other (non HMO) | Admitting: Podiatry

## 2020-09-30 DIAGNOSIS — M79674 Pain in right toe(s): Secondary | ICD-10-CM | POA: Diagnosis not present

## 2020-09-30 DIAGNOSIS — B351 Tinea unguium: Secondary | ICD-10-CM | POA: Diagnosis not present

## 2020-09-30 DIAGNOSIS — M79675 Pain in left toe(s): Secondary | ICD-10-CM | POA: Diagnosis not present

## 2020-09-30 NOTE — Progress Notes (Signed)
   SUBJECTIVE Patient presents to office today complaining of elongated, thickened nails that cause pain while ambulating in shoes.  He is unable to trim his own nails to his left hallux.  He does have a history of a total permanent nail avulsion to the right hallux nail plate.  This healed uneventfully and is very satisfied with the results. Patient is here for further evaluation and treatment.  He would like to eventually have the left hallux nail plate removed completely as well.  Past Medical History:  Diagnosis Date  . Chicken pox   . GERD (gastroesophageal reflux disease)   . Hypertension   . Morbid obesity ([facility])   . Sleep apnea     OBJECTIVE General Patient is awake, alert, and oriented x 3 and in no acute distress. Derm Skin is dry and supple bilateral. Negative open lesions or macerations. Remaining integument unremarkable. Nails are tender, long, thickened and dystrophic with subungual debris, consistent with onychomycosis, 1-5 bilateral. No signs of infection noted.  Heavy dystrophy and discoloration with thickening noted to the left hallux nail plate with some sensitivity. Vasc  DP and PT pedal pulses palpable bilaterally. Temperature gradient within normal limits.  Neuro Epicritic and protective threshold sensation grossly intact bilaterally.  Musculoskeletal Exam No symptomatic pedal deformities noted bilateral. Muscular strength within normal limits.  ASSESSMENT 1. Onychodystrophic nails 1-5 bilateral with hyperkeratosis of nails.  2. Onychomycosis of nail due to dermatophyte bilateral 3. Pain in foot bilateral 4. Dystrophic nail left hallux 5. H/o total permanent nail avulsion RT hallux  PLAN OF CARE 1. Patient evaluated today.  2. Instructed to maintain good pedal hygiene and foot care.  3. Mechanical debridement of nails 1-5 bilaterally performed using a nail nipper. Filed with dremel without incident.  4. Return to clinic in 6 weeks for total permanent nail avulsion  of the left hallux nail plate.   *Truck driver for Oswald Hillock, DPM [facility]  Dr. Felecia Shelling, DPM    [address] [place], [place] [address]                Office [phone]  Fax [phone]

## 2020-10-05 ENCOUNTER — Telehealth: Payer: Self-pay | Admitting: Family

## 2020-10-05 DIAGNOSIS — D649 Anemia, unspecified: Secondary | ICD-10-CM | POA: Insufficient documentation

## 2020-10-05 DIAGNOSIS — R944 Abnormal results of kidney function studies: Secondary | ICD-10-CM | POA: Insufficient documentation

## 2020-10-05 DIAGNOSIS — R1012 Left upper quadrant pain: Secondary | ICD-10-CM | POA: Insufficient documentation

## 2020-10-05 DIAGNOSIS — D72829 Elevated white blood cell count, unspecified: Secondary | ICD-10-CM | POA: Insufficient documentation

## 2020-10-05 NOTE — Telephone Encounter (Signed)
Patient said that he went to Urgent Care last week and is requesting a call back in regards to the lab work that was done when he went there.   Phone: [phone].

## 2020-10-05 NOTE — Telephone Encounter (Signed)
Yes, he would need an appointment and would need to bring the lab results for review if he won't give you any further information.

## 2020-10-05 NOTE — Telephone Encounter (Signed)
Pt is going to try and obtain lab results from urgent care and then call back to schedule and ov.

## 2020-10-07 ENCOUNTER — Telehealth: Payer: Self-pay | Admitting: Family

## 2020-10-07 ENCOUNTER — Other Ambulatory Visit: Payer: Managed Care, Other (non HMO)

## 2020-10-07 NOTE — Telephone Encounter (Signed)
We got copies of the labs from the [facility]; it looks like he has already scheduled a follow-up with his cardiologist for lab re-check and OV; if he has continued concerns after seeing cardiology, he needs an OV;

## 2020-10-07 NOTE — Telephone Encounter (Signed)
Discuss previous note with patient and he understood.

## 2020-10-08 ENCOUNTER — Other Ambulatory Visit: Payer: Managed Care, Other (non HMO)

## 2020-10-08 ENCOUNTER — Other Ambulatory Visit: Payer: Self-pay

## 2020-10-08 DIAGNOSIS — E782 Mixed hyperlipidemia: Secondary | ICD-10-CM

## 2020-10-08 DIAGNOSIS — I10 Essential (primary) hypertension: Secondary | ICD-10-CM

## 2020-10-09 LAB — HEPATIC FUNCTION PANEL
ALT: 15 IU/L (ref 0–44)
AST: 18 IU/L (ref 0–40)
Albumin: 4.2 g/dL (ref 4.0–5.0)
Alkaline Phosphatase: 73 IU/L (ref 44–121)
Bilirubin Total: 0.3 mg/dL (ref 0.0–1.2)
Bilirubin, Direct: 0.11 mg/dL (ref 0.00–0.40)
Total Protein: 7.1 g/dL (ref 6.0–8.5)

## 2020-10-09 LAB — BASIC METABOLIC PANEL
BUN/Creatinine Ratio: 13 (ref 9–20)
BUN: 20 mg/dL (ref 6–24)
CO2: 23 mmol/L (ref 20–29)
Calcium: 9.1 mg/dL (ref 8.7–10.2)
Chloride: 99 mmol/L (ref 96–106)
Creatinine, Ser: 1.51 mg/dL — ABNORMAL HIGH (ref 0.76–1.27)
GFR calc Af Amer: 64 mL/min/{1.73_m2} (ref >59)
GFR calc non Af Amer: 55 mL/min/{1.73_m2} — ABNORMAL LOW (ref >59)
Glucose: 93 mg/dL (ref 65–99)
Potassium: 4.8 mmol/L (ref 3.5–5.2)
Sodium: 133 mmol/L — ABNORMAL LOW (ref 134–144)

## 2020-10-09 LAB — LIPID PANEL
Chol/HDL Ratio: 2.8 ratio (ref 0.0–5.0)
Cholesterol, Total: 121 mg/dL (ref 100–199)
HDL: 44 mg/dL (ref >39)
LDL Chol Calc (NIH): 64 mg/dL (ref 0–99)
Triglycerides: 58 mg/dL (ref 0–149)
VLDL Cholesterol Cal: 13 mg/dL (ref 5–40)

## 2020-10-11 ENCOUNTER — Encounter: Payer: Self-pay | Admitting: Cardiovascular Disease

## 2020-10-11 NOTE — Progress Notes (Signed)
This encounter was created in error - please disregard.

## 2020-10-12 ENCOUNTER — Encounter: Payer: Managed Care, Other (non HMO) | Admitting: Cardiovascular Disease

## 2020-10-13 ENCOUNTER — Ambulatory Visit: Payer: Managed Care, Other (non HMO) | Admitting: Cardiovascular Disease

## 2020-11-11 ENCOUNTER — Ambulatory Visit: Payer: Managed Care, Other (non HMO) | Admitting: Podiatry

## 2020-12-05 DIAGNOSIS — M109 Gout, unspecified: Secondary | ICD-10-CM

## 2020-12-05 HISTORY — DX: Gout, unspecified: M10.9

## 2020-12-11 ENCOUNTER — Encounter: Payer: Self-pay | Admitting: Cardiovascular Disease

## 2020-12-11 ENCOUNTER — Other Ambulatory Visit: Payer: Self-pay

## 2020-12-11 ENCOUNTER — Ambulatory Visit (INDEPENDENT_AMBULATORY_CARE_PROVIDER_SITE_OTHER): Payer: Managed Care, Other (non HMO) | Admitting: Cardiovascular Disease

## 2020-12-11 VITALS — BP 124/74 | HR 73 | Ht 73.0 in | Wt 327.0 lb

## 2020-12-11 DIAGNOSIS — I10 Essential (primary) hypertension: Secondary | ICD-10-CM | POA: Diagnosis not present

## 2020-12-11 DIAGNOSIS — E782 Mixed hyperlipidemia: Secondary | ICD-10-CM

## 2020-12-11 NOTE — Progress Notes (Signed)
Cardiology Office Note   Date:  12/11/2020   ID:  Robert Cruz, DOB June 21, 1976, MRN [id number]  PCP:  Olive Bass, FNP  Cardiologist:   Kristeen Miss, MD   Chief Complaint  Patient presents with  . Hypertension   Problem List 1. Essential HTN 2. Obstructive sleep apnea 3, GERD    Previous notes:  Robert Cruz is a 45 y.o. male who presents for evaluation of his HTN He has been on multiple meds with minimal response  Very busy at work .    Not a lot of time to exercise  Has been on a better diet for the past 1 year  or so.  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  No DOE doing fairly moderate exercise ( ie no dyspnea walking up the stadium steps at the [employer] a month ago )  Has not lost any weight on his new diet.   Typically break fast - eggs, [place] sausage /turkey bacon  Lunch - subway - 6 inch or salad   - used to eat lots of fried foods, [employer], chick fil a  Dinner - spag. Occasionally take out from [employer], [place]   Apr 05, 2016:  Robert Cruz is seen back today for follow up of his HTN Echo shows normal LV function with moderate LAE  Has tried clonidine -  Was not effective so it was stopped  Hydralazine was substituted.  BP is still high  Has tried Bystolic - caused a severe headache Still drinking lots of sweet tea Lots of juices. Still eats out quite a bit .  Weight is 343 today .  I advised him to lose 5-10 lbs before his next visit .   May 09, 2016  weight is 341 today  BP is elevated.  Has OSA - has been using CPAP , may need some adjustment on the mask gasket  Has been working lots - worked 24 straight days  Avoiding salt    Oct. 18, 2017:  Robert Cruz is seen back for office visit for HTN Has done well on the Aldactone Feeling better.  Headaches have resolved,   Leg edema has resolved.   09/08/2017 Works now for Dance movement psychotherapist.   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 Still drinking sweet tea.   Wt = 334 ( down 6 lbs from last year)  Having some abd. Pain after eating  Symptoms sound like esophageal stricture /   hiatal hernia / or  GERD  Has had esophageal dilitation in the past.  Has cut out greasy foods.   Does eat some bacon on occasion.   September 26, 2018:  Robert Cruz is seen today for follow-up visit.  He has a history of morbid obesity, hypertension, hyperlipidemia.  His weight today is 345 pounds 11 pounds from last year. Trying to avoid salt .  Is not drinking sweat tea .  Will be seeing a nutritiionist this week .  No CP or dyspnea.  Working for [employer]   February 25, 2020:   Doing well Wt is down 35 lbs from 2019 Is exercising  Some .  Is married  December 11, 2020: Robert Cruz is seen today for follow-up visit.  Vital signs look good.  His weight today is 327 pounds  Past Medical History:  Diagnosis Date  . Chicken pox   . GERD (gastroesophageal reflux disease)   . Hypertension   . Morbid obesity ([facility])   . Sleep apnea     History reviewed. No pertinent surgical history.   Current Outpatient Medications  Medication Sig Dispense Refill  . aspirin EC 81 MG tablet Take 1 tablet (81 mg total) by mouth daily. Swallow whole. 90 tablet 1  . atenolol (TENORMIN) 100 MG tablet Take 1 tablet (100 mg total) by mouth daily. 90 tablet 3  . atorvastatin (LIPITOR) 40 MG tablet Take 1 tablet (40 mg total) by mouth daily. 90 tablet 3  . hydrochlorothiazide (HYDRODIURIL) 25 MG tablet Take 1 tablet (25 mg total) by mouth daily. 90 tablet 3  . lisinopril (ZESTRIL) 20 MG tablet Take 1 tablet (20 mg total) by mouth daily. 90 tablet 3  . spironolactone (ALDACTONE) 100 MG tablet Take 1 tablet (100 mg total) by mouth daily. 90 tablet 3   No current facility-administered medications for this visit.    Allergies:   Amlodipine and Bystolic [nebivolol hcl]    Social History:   The patient  reports that he has never smoked. He has never used smokeless tobacco. He reports that he does not drink alcohol and does not use drugs.   Family History:  The patient's family history includes Depression in his father; Healthy in his brother and sister; Hypertension in his father, maternal grandmother, and mother; Kidney disease in his maternal grandmother.    ROS:  Please see the history of present illness.    Physical Exam: Blood pressure 124/74, pulse 73, height 6\' 1"  (1.854 m), weight (!) 327 lb (148.3 kg), SpO2 97 %.  GEN: Middle-aged, obese gentleman, no acute distress. HEENT: Normal NECK: No JVD; No carotid bruits LYMPHATICS: No lymphadenopathy CARDIAC: RRR , no murmurs, rubs, gallops RESPIRATORY:  Clear to auscultation without rales, wheezing or rhonchi  ABDOMEN: Soft, non-tender, non-distended MUSCULOSKELETAL:  No edema; No deformity  SKIN: Warm and dry NEUROLOGIC:  Alert and oriented x 3   EKG:   December 11, 2020: Normal sinus rhythm at 73.  No ST or T wave changes.  Recent Labs: 09/04/2020: Hemoglobin 12.2; Platelets 299.0 10/08/2020: ALT 15; BUN 20; Creatinine, Ser 1.51; Potassium 4.8; Sodium 133    Lipid Panel    Component Value Date/Time   CHOL 121 10/08/2020 1508   TRIG 58 10/08/2020 1508   HDL 44 10/08/2020 1508   CHOLHDL 2.8 10/08/2020 1508   CHOLHDL 4 09/04/2020 1704   VLDL 15.6 09/04/2020 1704   LDLCALC 64 10/08/2020 1508      Wt Readings from Last 3 Encounters:  12/11/20 (!) 327 lb (148.3 kg)  09/09/20 (!) 318 lb 9.6 oz (144.5 kg)  09/04/20 (!) 317 lb 12.8 oz (144.2 kg)      Other studies Reviewed: Additional studies/ records that were reviewed today include: . Review of the above records demonstrates:    ASSESSMENT AND PLAN:  1.  Essential HTN-  .    Blood pressure and heart rate look good.  I encouraged him to stay on the same medications.  He is to continue with his weight loss efforts.   2. Obesity :   He still struggling  with his weight.  He goes to the primary is about 3 times a week.  Of cautioned him about going into the stairs too much.  He probably needs to avoid [place] and   similar restaurants  2. Leg edema -no further episodes of leg edema.    3. Hyperlipidemia:    4. Obstructive sleep apnea     Current medicines are reviewed at length with the patient today.  The patient does not have concerns regarding medicines.  The following changes have been made:  We'll discontinue metoprolol and try carvedilol 25 mg twice a day.  Labs/ tests ordered today include:   Orders Placed This Encounter  Procedures  . EKG 12-Lead    Disposition:       Mertie Moores, MD  12/11/2020 5:44 PM    [facility] [place], [place],   [address] Phone: [phone]; Fax: [phone]

## 2020-12-11 NOTE — Patient Instructions (Signed)
Medication Instructions:  No changes *If you need a refill on your cardiac medications before your next appointment, please call your pharmacy*   Lab Work: none If you have labs (blood work) drawn today and your tests are completely normal, you will receive your results only by: MyChart Message (if you have MyChart) OR A paper copy in the mail If you have any lab test that is abnormal or we need to change your treatment, we will call you to review the results.   Testing/Procedures: none   Follow-Up: At [facility], you and your health needs are our priority.  As part of our continuing mission to provide you with exceptional heart care, we have created designated Provider Care Teams.  These Care Teams include your primary Cardiologist (physician) and Advanced Practice Providers (APPs -  Physician Assistants and Nurse Practitioners) who all work together to provide you with the care you need, when you need it.   Your next appointment:   12 month(s)  The format for your next appointment:   In Person  Provider:   You may see Philip Nahser, MD or one of the following Advanced Practice Providers on your designated Care Team:   Scott Weaver, PA-C Vin Bhagat, PA-C   Other Instructions   

## 2021-01-05 NOTE — Progress Notes (Signed)
NEUROLOGY FOLLOW UP OFFICE NOTE  Robert Cruz [id number]   Subjective:  Robert Cruz is a 45 year old right-handed male with HTN and sleep apnea who follows up for TIA.  UPDATE: Current medications:  ASA 81mg , atorvastatin 40mg  daily, lisinopril 20mg  daily, HCTZ, atenolol 100mg  daily.  07/16/2020 CTA HEAD:  Normal. 07/28/2020 2D ECHO W BUBBLE STUDY:  LVEF 55-60%, negative bubble study 09/04/2021 HGB A1c 6.6 10/08/2021 LDL 64  He reports history of vertigo since around 2008-2009 that has been getting worse.  If he looks up or down, he exhibits spinning sensation with nausea and diaphoresis.  It lasts a few seconds.  He doesn't really recall how frequent it occurs.  It was particularly aggravated during New Year's when he was riding a sky wheel.  If he takes out his contacts and puts on his glasses, it triggers.  HISTORY: While on vacation in [place] on 03/30/2020, he developed transient right-sided numbness and tingling involving the left arm and leg.  He was laying down and when he stood up, it did not resolve.  There was no associated headache, facial droop, extremity weakness or slurred speech.  He went to [facility] where he developed left arm and leg numbness as well and he was admitted for stroke workup. Symptoms lasted a couple of hours.  CT head which was motion-limited, showed no acute intracranial abnormality.  MRI of brain also showed no acute intracranial abnormality.  Carotid doppler revealed no hemodynamically significant stenosis.  Hgb A1c was 6.3.  LDL was 104.  He was started on ASA 81mg  and Plavix 75mg  daily and Lipitor 40mg  daily.  In hindsight, he questions if he actually had a TIA.  He said he had been drinking brown liquor and was a little intoxicated and when he developed the symptoms, he started having anxiety.  For the past month, he started having a slight headache on the top of his head, lasting 30 minutes and occurring 1 to  2 times a week.  It is manageable.  A few years ago, he had daily headaches related to uncontrolled blood pressure and medication overuse (daily Goody) but otherwise no other history of headache.  Blood pressure has been controlled.  PAST MEDICAL HISTORY: Past Medical History:  Diagnosis Date  . Chicken pox   . GERD (gastroesophageal reflux disease)   . Hypertension   . Morbid obesity (HCC)   . Sleep apnea     MEDICATIONS: Current Outpatient Medications on File Prior to Visit  Medication Sig Dispense Refill  . aspirin EC 81 MG tablet Take 1 tablet (81 mg total) by mouth daily. Swallow whole. 90 tablet 1  . atenolol (TENORMIN) 100 MG tablet Take 1 tablet (100 mg total) by mouth daily. 90 tablet 3  . atorvastatin (LIPITOR) 40 MG tablet Take 1 tablet (40 mg total) by mouth daily. 90 tablet 3  . hydrochlorothiazide (HYDRODIURIL) 25 MG tablet Take 1 tablet (25 mg total) by mouth daily. 90 tablet 3  . lisinopril (ZESTRIL) 20 MG tablet Take 1 tablet (20 mg total) by mouth daily. 90 tablet 3  . spironolactone (ALDACTONE) 100 MG tablet Take 1 tablet (100 mg total) by mouth daily. 90 tablet 3   No current facility-administered medications on file prior to visit.    ALLERGIES: Allergies  Allergen Reactions  . Amlodipine Other (See Comments)    edema  . Bystolic [Nebivolol Hcl] Other (See Comments)    Patient stated medication gave him  major headaches    FAMILY HISTORY: Family History  Problem Relation Age of Onset  . Hypertension Father   . Depression Father   . Hypertension Mother   . Kidney disease Maternal Grandmother        Dialysis  . Hypertension Maternal Grandmother   . Healthy Brother        x1  . Healthy Sister        x3  . Colon cancer Neg Hx   . Esophageal cancer Neg Hx   . Rectal cancer Neg Hx   . Stomach cancer Neg Hx     SOCIAL HISTORY: Social History   Socioeconomic History  . Marital status: Significant Other    Spouse name: Not on file  . Number of  children: 5  . Years of education: 66  . Highest education level: Not on file  Occupational History  . Occupation: Material Buyer, retail: [employer]  Tobacco Use  . Smoking status: Never Smoker  . Smokeless tobacco: Never Used  Vaping Use  . Vaping Use: Never used  Substance and Sexual Activity  . Alcohol use: No  . Drug use: No  . Sexual activity: Not on file  Other Topics Concern  . Not on file  Social History Narrative   Right handed   Two Story Home    Lives with wife and family   Drinks Caffeine everyday   Social Determinants of Health   Financial Resource Strain: Not on file  Food Insecurity: Not on file  Transportation Needs: Not on file  Physical Activity: Not on file  Stress: Not on file  Social Connections: Not on file  Intimate Partner Violence: Not on file     Objective:  Blood pressure 129/75, pulse 84, resp. rate 20, height 6\' 2"  (1.88 m), weight (!) 336 lb (152.4 kg), SpO2 96 %. General: No acute distress.  Patient appears well-groomed.   Head:  Normocephalic/atraumatic Eyes:  Fundi examined but not visualized Neck: supple, no paraspinal tenderness, full range of motion Heart:  Regular rate and rhythm Lungs:  Clear to auscultation bilaterally Back: No paraspinal tenderness Neurological Exam: alert and oriented to person, place, and time. Attention span and concentration intact, recent and remote memory intact, fund of knowledge intact.  Speech fluent and not dysarthric, language intact.  CN II-XII intact. Bulk and tone normal, muscle strength 5/5 throughout.  Sensation to light touch, temperature and vibration intact.  Deep tendon reflexes 2+ throughout, toes downgoing.  Finger to nose and heel to shin testing intact.  Gait normal, Romberg negative.   Assessment/Plan:   1.  Stroke-like event.  I suspect this was related to alcohol consumption and anxiety.  Bilateral numbness and tingling in setting of negative MRI makes TIA/cerebrovascular event  unlikely 2.  Benign paroxysmal positional vertigo 3.  Hypertension  1.  At this point, ASA not indicated as I do not suspect TIA.  However, he should continue treating comorbidities such as HTN, HLD and OSA. 2.  If vertigo becomes more frequent, he may benefit from vestibular rehab 3.  Follow up as needed.  , DO  CC:  Shon Millet, FNP

## 2021-01-07 ENCOUNTER — Ambulatory Visit (INDEPENDENT_AMBULATORY_CARE_PROVIDER_SITE_OTHER): Payer: Managed Care, Other (non HMO) | Admitting: Neurology

## 2021-01-07 ENCOUNTER — Other Ambulatory Visit: Payer: Self-pay

## 2021-01-07 ENCOUNTER — Encounter: Payer: Self-pay | Admitting: Neurology

## 2021-01-07 VITALS — BP 129/75 | HR 84 | Resp 20 | Ht 74.0 in | Wt 336.0 lb

## 2021-01-07 DIAGNOSIS — F419 Anxiety disorder, unspecified: Secondary | ICD-10-CM | POA: Diagnosis not present

## 2021-01-07 DIAGNOSIS — H8110 Benign paroxysmal vertigo, unspecified ear: Secondary | ICD-10-CM

## 2021-01-07 NOTE — Patient Instructions (Signed)
At this point, I doubt that you had a mini-stroke/TIA.  It was likely related to alcohol and anxiety.  The dizzy spells are due to benign paroxysmal positional vertigo.   Benign Positional Vertigo Vertigo is the feeling that you or your surroundings are moving when they are not. Benign positional vertigo is the most common form of vertigo. This is usually a harmless condition (benign). This condition is positional. This means that symptoms are triggered by certain movements and positions. This condition can be dangerous if it occurs while you are doing something that could cause harm to you or others. This includes activities such as driving or operating machinery. What are the causes? The inner ear has fluid-filled canals that help your brain sense movement and balance. When the fluid moves, the brain receives messages about your body's position. With benign positional vertigo, crystals in the inner ear break free and disturb the inner ear area. This causes your brain to receive confusing messages about your body's position. What increases the risk? You are more likely to develop this condition if:  You are a woman.  You are 44 years of age or older.  You have recently had a head injury.  You have an inner ear disease. What are the signs or symptoms? Symptoms of this condition usually happen when you move your head or your eyes in different directions. Symptoms may start suddenly, and usually last for less than a minute. They include:  Loss of balance and falling.  Feeling like you are spinning or moving.  Feeling like your surroundings are spinning or moving.  Nausea and vomiting.  Blurred vision.  Dizziness.  Involuntary eye movement (nystagmus). Symptoms can be mild and cause only minor problems, or they can be severe and interfere with daily life. Episodes of benign positional vertigo may return (recur) over time. Symptoms may improve over time. How is this diagnosed? This  condition may be diagnosed based on:  Your medical history.  Physical exam of the head, neck, and ears.  Positional tests to check for or stimulate vertigo. You may be asked to turn your head and change positions, such as going from sitting to lying down. A health care provider will watch for symptoms of vertigo. You may be referred to a health care provider who specializes in ear, nose, and throat problems (ENT, or otolaryngologist) or a provider who specializes in disorders of the nervous system (neurologist). How is this treated? This condition may be treated in a session in which your health care provider moves your head in specific positions to help the displaced crystals in your inner ear move. Treatment for this condition may take several sessions. Surgery may be needed in severe cases, but this is rare. In some cases, benign positional vertigo may resolve on its own in 2-4 weeks.   Follow these instructions at home: Safety  Move slowly. Avoid sudden body or head movements or certain positions, as told by your health care provider.  Avoid driving until your health care provider says it is safe for you to do so.  Avoid operating heavy machinery until your health care provider says it is safe for you to do so.  Avoid doing any tasks that would be dangerous to you or others if vertigo occurs.  If you have trouble walking or keeping your balance, try using a cane for stability. If you feel dizzy or unstable, sit down right away.  Return to your normal activities as told by your health care  provider. Ask your health care provider what activities are safe for you. General instructions  Take over-the-counter and prescription medicines only as told by your health care provider.  Drink enough fluid to keep your urine pale yellow.  Keep all follow-up visits as told by your health care provider. This is important. Contact a health care provider if:  You have a fever.  Your condition  gets worse or you develop new symptoms.  Your family or friends notice any behavioral changes.  You have nausea or vomiting that gets worse.  You have numbness or a prickling and tingling sensation. Get help right away if you:  Have difficulty speaking or moving.  Are always dizzy.  Faint.  Develop severe headaches.  Have weakness in your legs or arms.  Have changes in your hearing or vision.  Develop a stiff neck.  Develop sensitivity to light. Summary  Vertigo is the feeling that you or your surroundings are moving when they are not. Benign positional vertigo is the most common form of vertigo.  This condition is caused by crystals in the inner ear that become displaced. This causes a disturbance in an area of the inner ear that helps your brain sense movement and balance.  Symptoms include loss of balance and falling, feeling that you or your surroundings are moving, nausea and vomiting, and blurred vision.  This condition can be diagnosed based on symptoms, a physical exam, and positional tests.  Follow safety instructions as told by your health care provider. You will also be told when to contact your health care provider in case of problems. This information is not intended to replace advice given to you by your health care provider. Make sure you discuss any questions you have with your health care provider. Document Revised: 10/15/2019 Document Reviewed: 05/02/2018 Elsevier Patient Education  2021 [employer].

## 2021-01-25 ENCOUNTER — Other Ambulatory Visit: Payer: Self-pay

## 2021-01-26 ENCOUNTER — Ambulatory Visit: Payer: Managed Care, Other (non HMO) | Admitting: Family

## 2021-03-11 ENCOUNTER — Ambulatory Visit: Payer: Managed Care, Other (non HMO) | Admitting: Family Medicine

## 2021-03-11 NOTE — Progress Notes (Incomplete)
   I, Philbert Riser, LAT, ATC acting as a scribe for [name], MD.  Robert Cruz is a 45 y.o. male who presents to [facility] today for continued chronic R shoulder pain. Pt delivers tires for work. Pt was last seen by Dr. [name] on 10/6/21and was advised given a R shoulder steroid injection and referral to PT of which he completed # visits. Today, pt reports . Pt locates pain to   Neck pain: UE numbness/tingling: Aggravates: Treatments tried:  Dx imaging: 09/09/20 R shoulder XR  09/08/19 C-spine CT  Pertinent review of systems: ***  Relevant historical information: ***   Exam:  There were no vitals taken for this visit. General: Well Developed, well nourished, and in no acute distress.   MSK: ***    Lab and Radiology Results No results found for this or any previous visit (from the past 72 hour(s)). No results found.     Assessment and Plan: 45 y.o. male with ***   PDMP not reviewed this encounter. No orders of the defined types were placed in this encounter.  No orders of the defined types were placed in this encounter.    Discussed warning signs or symptoms. Please see discharge instructions. Patient expresses understanding.   ***

## 2021-04-07 ENCOUNTER — Encounter: Payer: Self-pay | Admitting: Family Medicine

## 2021-04-07 ENCOUNTER — Telehealth (INDEPENDENT_AMBULATORY_CARE_PROVIDER_SITE_OTHER): Payer: Managed Care, Other (non HMO) | Admitting: Family Medicine

## 2021-04-07 ENCOUNTER — Other Ambulatory Visit: Payer: Self-pay

## 2021-04-07 DIAGNOSIS — U071 COVID-19: Secondary | ICD-10-CM | POA: Diagnosis not present

## 2021-04-07 NOTE — Progress Notes (Signed)
Chief Complaint  Patient presents with  . Covid Positive    Robert Cruz here for URI complaints. Due to COVID-19 pandemic, we are interacting via web portal for an electronic face-to-face visit. I verified patient's ID using 2 identifiers. Patient agreed to proceed with visit via this method. Patient is at home, I am at office. Patient, his wife and I are present for visit.   Duration: 4 days  Associated symptoms: slight cough and minor sore throat; no longer having severe ST, coughing, diarrhea, headache, fevers, body aches Denies: sinus congestion, sinus pain, rhinorrhea, itchy watery eyes, ear pain, ear drainage, sore throat, wheezing, shortness of breath, myalgia and fevers Treatment to date: Robitussin, Nyquil Sick contacts: Yes- wife Tested + for covid today. Not vaccinated.   Past Medical History:  Diagnosis Date  . Chicken pox   . GERD (gastroesophageal reflux disease)   . Hypertension   . Morbid obesity (HCC)   . Sleep apnea    Objective No conversational dyspnea Age appropriate judgment and insight Nml affect and mood  COVID-19  I think he's getting better. Discussed return to work. Letter released on MyChart stating he can return Tues 5/10 or sooner if feeling better.  Continue to push fluids, practice good hand hygiene, cover mouth when coughing. F/u prn. If starting to experience irreplaceable fluid loss, shaking, or shortness of breath, seek immediate care. Pt voiced understanding and agreement to the plan.  [name], DO 04/07/21 3:31 PM

## 2021-04-08 ENCOUNTER — Telehealth: Payer: Self-pay | Admitting: Family Medicine

## 2021-04-08 MED ORDER — PROMETHAZINE-DM 6.25-15 MG/5ML PO SYRP
5.0000 mL | ORAL_SOLUTION | Freq: Four times a day (QID) | ORAL | 0 refills | Status: DC | PRN
Start: 2021-04-08 — End: 2021-06-03

## 2021-04-08 NOTE — Telephone Encounter (Signed)
Sent dr.wendling the patient's mychart message

## 2021-04-08 NOTE — Telephone Encounter (Signed)
Caller Robert Cruz  Call Back @ [phone]  Patient seen Dr Carmelia Roller yesterday virtually, patient was dx with covid and would like a medication sent to pharmacy to help with coughing and phlegm

## 2021-05-06 ENCOUNTER — Other Ambulatory Visit: Payer: Self-pay | Admitting: Cardiovascular Disease

## 2021-05-07 ENCOUNTER — Other Ambulatory Visit: Payer: Self-pay | Admitting: *Deleted

## 2021-05-07 ENCOUNTER — Other Ambulatory Visit: Payer: Self-pay

## 2021-05-07 MED ORDER — LISINOPRIL 20 MG PO TABS: 20.0000 mg | ORAL_TABLET | Freq: Every day | ORAL | 3 refills | Status: DC

## 2021-05-07 MED ORDER — HYDROCHLOROTHIAZIDE 25 MG PO TABS: 25.0000 mg | ORAL_TABLET | Freq: Every day | ORAL | 3 refills | Status: DC

## 2021-05-07 MED ORDER — SPIRONOLACTONE 100 MG PO TABS: 100.0000 mg | ORAL_TABLET | Freq: Every day | ORAL | 1 refills | Status: DC

## 2021-05-07 MED ORDER — ATORVASTATIN CALCIUM 40 MG PO TABS: 40.0000 mg | ORAL_TABLET | Freq: Every day | ORAL | 3 refills | Status: DC

## 2021-05-07 NOTE — Telephone Encounter (Signed)
Pt's medication was sent to pt's pharmacy as requested. Confirmation received.  °

## 2021-06-02 ENCOUNTER — Emergency Department (HOSPITAL_BASED_OUTPATIENT_CLINIC_OR_DEPARTMENT_OTHER)
Admission: EM | Admit: 2021-06-02 | Discharge: 2021-06-02 | Disposition: A | Discharge status: Home / Self Care | Payer: Managed Care, Other (non HMO) | Attending: Emergency Medicine | Admitting: Emergency Medicine

## 2021-06-02 ENCOUNTER — Encounter (HOSPITAL_BASED_OUTPATIENT_CLINIC_OR_DEPARTMENT_OTHER): Payer: Self-pay

## 2021-06-02 ENCOUNTER — Other Ambulatory Visit: Payer: Self-pay

## 2021-06-02 DIAGNOSIS — F17210 Nicotine dependence, cigarettes, uncomplicated: Secondary | ICD-10-CM | POA: Diagnosis not present

## 2021-06-02 DIAGNOSIS — G4489 Other headache syndrome: Secondary | ICD-10-CM | POA: Diagnosis not present

## 2021-06-02 DIAGNOSIS — Z79899 Other long term (current) drug therapy: Secondary | ICD-10-CM | POA: Insufficient documentation

## 2021-06-02 DIAGNOSIS — I10 Essential (primary) hypertension: Secondary | ICD-10-CM | POA: Diagnosis not present

## 2021-06-02 DIAGNOSIS — Z7982 Long term (current) use of aspirin: Secondary | ICD-10-CM | POA: Insufficient documentation

## 2021-06-02 DIAGNOSIS — R519 Headache, unspecified: Secondary | ICD-10-CM | POA: Diagnosis present

## 2021-06-02 MED ORDER — KETOROLAC TROMETHAMINE 60 MG/2ML IM SOLN
60.0000 mg | Freq: Once | INTRAMUSCULAR | Status: AC
  Administered 2021-06-02: 60 mg via INTRAMUSCULAR
  Filled 2021-06-02: qty 2

## 2021-06-02 NOTE — Discharge Instructions (Addendum)

## 2021-06-02 NOTE — ED Triage Notes (Signed)
Pt c/o HA x3-4 days. Pt states he was unable to go to sleep due to the pain.

## 2021-06-02 NOTE — ED Provider Notes (Signed)
[facility] EMERGENCY DEPARTMENT Provider Note   CSN: [id number] Arrival date & time: 06/02/21  0144     History Chief Complaint  Patient presents with   Headache    Robert Cruz is a 45 y.o. male.  The history is provided by the patient.  Headache Pain location:  Generalized Quality:  Dull Radiates to:  Face, L neck and R neck Onset quality:  Gradual Duration:  4 days Timing:  Constant Progression:  Worsening Chronicity:  Recurrent Relieved by: Robert Cruz powder. Worsened by:  Nothing Associated symptoms: facial pain   Associated symptoms: no blurred vision, no fever, no focal weakness, no numbness, no visual change, no vomiting and no weakness    Patient presents for headache.  Reports it started gradually about 4 days ago.  It seems to involve his face but also goes posteriorly to his neck.  No fevers or vomiting.  No focal weakness.  He uses Goody powders with some relief   He has had these headaches before and is seen neurology No recent trauma or falls. Past Medical History:  Diagnosis Date   Chicken pox    GERD (gastroesophageal reflux disease)    Hypertension    Morbid obesity (HCC)    Sleep apnea     Patient Active Problem List   Diagnosis Date Noted   Acute pain of left shoulder 10/18/2019   Left elbow pain 10/18/2019   Cough 10/03/2019   Mixed hyperlipidemia 09/08/2017   Allergic rhinitis 10/11/2016   Sore throat 10/11/2016   Chronic migraine without aura without status migrainosus, not intractable 01/14/2016   Resistant hypertension 01/04/2016   Generalized headache 12/16/2015   Encounter for preventive health examination 09/06/2013   GERD (gastroesophageal reflux disease) 09/06/2013   DYSPHAGIA UNSPECIFIED 02/08/2011   OSA (obstructive sleep apnea) 12/28/2008   Severe obesity (BMI >= 40) (HCC) 10/07/2008   Essential hypertension 08/07/2007    History reviewed. No pertinent surgical history.     Family History  Problem Relation  Age of Onset   Hypertension Father    Depression Father    Hypertension Mother    Kidney disease Maternal Grandmother        Dialysis   Hypertension Maternal Grandmother    Healthy Brother        x1   Healthy Sister        x3   Colon cancer Neg Hx    Esophageal cancer Neg Hx    Rectal cancer Neg Hx    Stomach cancer Neg Hx     Social History   Tobacco Use   Smoking status: Some Days    Pack years: 0.00    Types: Cigars   Smokeless tobacco: Never  Vaping Use   Vaping Use: Never used  Substance Use Topics   Alcohol use: No   Drug use: No    Home Medications Prior to Admission medications   Medication Sig Start Date End Date Taking? Authorizing Provider  aspirin EC 81 MG tablet Take 1 tablet (81 mg total) by mouth daily. Swallow whole. 09/04/20   Olive Bass, FNP  atenolol (TENORMIN) 100 MG tablet Take 1 tablet (100 mg total) by mouth daily. 03/26/20   Nahser, Deloris Ping, MD  atorvastatin (LIPITOR) 40 MG tablet Take 1 tablet (40 mg total) by mouth daily. 05/07/21   Nahser, Deloris Ping, MD  hydrochlorothiazide (HYDRODIURIL) 25 MG tablet Take 1 tablet (25 mg total) by mouth daily. 05/07/21   Nahser, Deloris Ping, MD  lisinopril (  ZESTRIL) 20 MG tablet Take 1 tablet (20 mg total) by mouth daily. 05/07/21   Nahser, Deloris Ping, MD  promethazine-dextromethorphan (PROMETHAZINE-DM) 6.25-15 MG/5ML syrup Take 5 mLs by mouth 4 (four) times daily as needed for cough. 04/08/21   Sharlene Dory, DO  spironolactone (ALDACTONE) 100 MG tablet Take 1 tablet (100 mg total) by mouth daily. 05/07/21   Nahser, Deloris Ping, MD    Allergies    Amlodipine and Bystolic [nebivolol hcl]  Review of Systems   Review of Systems  Constitutional:  Negative for fever.  Eyes:  Negative for blurred vision and visual disturbance.  Cardiovascular:  Negative for chest pain.  Gastrointestinal:  Negative for vomiting.  Neurological:  Positive for headaches. Negative for focal weakness, weakness and numbness.  All  other systems reviewed and are negative.  Physical Exam Updated Vital Signs BP 114/81 (BP Location: Left Arm)   Pulse 75   Temp 98.2 F (36.8 C) (Oral)   Resp 20   Ht 1.854 m (6\' 1" )   Wt 136.1 kg   SpO2 100%   BMI 39.58 kg/m   Physical Exam CONSTITUTIONAL: Well developed/well nourished HEAD: Normocephalic/atraumatic EYES: EOMI/PERRL, no nystagmus, no ptosis, no corneal haziness ENMT: Mucous membranes moist, no dental tenderness.  No facial swelling.   NECK: supple no meningeal signs, no bruits SPINE/BACK:entire spine nontender CV: S1/S2 noted, no murmurs/rubs/gallops noted LUNGS: Lungs are clear to auscultation bilaterally, no apparent distress ABDOMEN: soft, nontender, no rebound or guarding GU:no cva tenderness NEURO:Awake/alert, face symmetric, no arm or leg drift is noted Equal 5/5 strength with shoulder abduction, elbow flex/extension, wrist flex/extension in upper extremities and equal hand grips bilaterally Equal 5/5 strength with hip flexion,knee flex/extension, foot dorsi/plantar flexion Cranial nerves 3/4/5/6/06/12/09/11/12 tested and intact Gait normal without ataxia No past pointing Sensation to light touch intact in all extremities EXTREMITIES: pulses normal, full ROM SKIN: warm, color normal PSYCH: no abnormalities of mood noted, alert and oriented to situation  ED Results / Procedures / Treatments   Labs (all labs ordered are listed, but only abnormal results are displayed) Labs Reviewed - No data to display  EKG None  Radiology No results found.  Procedures Procedures   Medications Ordered in ED Medications  ketorolac (TORADOL) injection 60 mg (60 mg Intramuscular Given 06/02/21 0524)    ED Course  I have reviewed the triage vital signs and the nursing notes.     MDM Rules/Calculators/A&P                          Patient presents for headache for the past 4 days. He has had these previously.  He has seen neurology previously.  He also  received a work-up in 2021 in [facility] which included MRI brain which was negative.  CT angio of his head in August 2021 was also negative.  Suspicion for acute neurologic emergency at this time is very low.  Patient is well-appearing.  He declines IV or any sedating medications.  We agreed to give patient one-time dose of Toradol, and he prefers to be discharged.  He will see his PCP later this week. He may also benefit from following up with neurology again.  Final Clinical Impression(s) / ED Diagnoses Final diagnoses:  Other headache syndrome    Rx / DC Orders ED Discharge Orders     None        September 2021, MD 06/02/21 [phone]

## 2021-06-03 ENCOUNTER — Other Ambulatory Visit: Payer: Self-pay | Admitting: Family

## 2021-06-03 ENCOUNTER — Ambulatory Visit (INDEPENDENT_AMBULATORY_CARE_PROVIDER_SITE_OTHER): Payer: Managed Care, Other (non HMO) | Admitting: Family

## 2021-06-03 ENCOUNTER — Encounter: Payer: Self-pay | Admitting: Family

## 2021-06-03 VITALS — BP 118/68 | HR 79 | Temp 98.0°F | Resp 18 | Ht 73.0 in | Wt 335.0 lb

## 2021-06-03 DIAGNOSIS — R7309 Other abnormal glucose: Secondary | ICD-10-CM | POA: Diagnosis not present

## 2021-06-03 DIAGNOSIS — G43809 Other migraine, not intractable, without status migrainosus: Secondary | ICD-10-CM

## 2021-06-03 MED ORDER — PREDNISONE 20 MG PO TABS
20.0000 mg | ORAL_TABLET | Freq: Every day | ORAL | 0 refills | Status: DC
Start: 2021-06-03 — End: 2021-06-18

## 2021-06-03 MED ORDER — KETOROLAC TROMETHAMINE 60 MG/2ML IM SOLN
60.0000 mg | Freq: Once | INTRAMUSCULAR | Status: AC
  Administered 2021-06-03: 60 mg via INTRAMUSCULAR

## 2021-06-03 NOTE — Progress Notes (Signed)
Robert Cruz is a 45 y.o. male with the following history as recorded in EpicCare:  Patient Active Problem List   Diagnosis Date Noted   Acute pain of left shoulder 10/18/2019   Left elbow pain 10/18/2019   Cough 10/03/2019   Mixed hyperlipidemia 09/08/2017   Allergic rhinitis 10/11/2016   Sore throat 10/11/2016   Chronic migraine without aura without status migrainosus, not intractable 01/14/2016   Resistant hypertension 01/04/2016   Generalized headache 12/16/2015   Encounter for preventive health examination 09/06/2013   GERD (gastroesophageal reflux disease) 09/06/2013   DYSPHAGIA UNSPECIFIED 02/08/2011   OSA (obstructive sleep apnea) 12/28/2008   Severe obesity (BMI >= 40) ([place]) 10/07/2008   Essential hypertension 08/07/2007    Current Outpatient Medications  Medication Sig Dispense Refill   atenolol (TENORMIN) 100 MG tablet Take 1 tablet (100 mg total) by mouth daily. 90 tablet 3   hydrochlorothiazide (HYDRODIURIL) 25 MG tablet Take 1 tablet (25 mg total) by mouth daily. 90 tablet 3   lisinopril (ZESTRIL) 20 MG tablet Take 1 tablet (20 mg total) by mouth daily. 90 tablet 3   predniSONE (DELTASONE) 20 MG tablet Take 1 tablet (20 mg total) by mouth daily with breakfast. 5 tablet 0   spironolactone (ALDACTONE) 100 MG tablet Take 1 tablet (100 mg total) by mouth daily. 90 tablet 1   atorvastatin (LIPITOR) 40 MG tablet Take 1 tablet (40 mg total) by mouth daily. (Patient not taking: Reported on 06/03/2021) 90 tablet 3   No current facility-administered medications for this visit.    Allergies: Amlodipine and Bystolic [nebivolol hcl]  Past Medical History:  Diagnosis Date   Chicken pox    GERD (gastroesophageal reflux disease)    Hypertension    Morbid obesity ([place])    Sleep apnea     No past surgical history on file.  Family History  Problem Relation Age of Onset   Hypertension Father    Depression Father    Hypertension Mother    Kidney disease Maternal Grandmother         Dialysis   Hypertension Maternal Grandmother    Healthy Brother        x1   Healthy Sister        x3   Colon cancer Neg Hx    Esophageal cancer Neg Hx    Rectal cancer Neg Hx    Stomach cancer Neg Hx     Social History   Tobacco Use   Smoking status: Some Days    Pack years: 0.00    Types: Cigars   Smokeless tobacco: Never  Substance Use Topics   Alcohol use: No    Subjective:   Seen at ER on June 29 at 2 am with severe headache; exam was unremarkable; does have documented history of migraine headache- extensive imaging done in 2021 which was normal; given Toradol with some relief but notes headache still persisting; no vision changes or dizziness; + light- sensitivity;   Objective:  Vitals:   06/03/21 1442  BP: 118/68  Pulse: 79  Resp: 18  Temp: 98 F (36.7 C)  SpO2: 97%  Weight: (!) 335 lb (152 kg)  Height: '6\' 1"'  (1.854 m)    General: Well developed, well nourished, in no acute distress  Skin : Warm and dry.  Head: Normocephalic and atraumatic  Eyes: Sclera and conjunctiva clear; pupils round and reactive to light; extraocular movements intact  Ears: External normal; canals clear; tympanic membranes normal  Oropharynx: Pink, supple. No suspicious  lesions  Neck: Supple without thyromegaly, adenopathy  Lungs: Respirations unlabored; clear to auscultation bilaterally without wheeze, rales, rhonchi  CVS exam: normal rate and regular rhythm.  Neurologic: Alert and oriented; speech intact; face symmetrical; moves all extremities well; CNII-XII intact without focal deficit   Assessment:  1. Other migraine without status migrainosus, not intractable   2. Elevated glucose     Plan:  Since it has been more than 24 hours since he was given Toradol injection, agree to give another injection; will also start oral prednisone- suspect "rebound headache; Refer back to his neurologist; Check labs; follow up to be determined;  This visit occurred during the SARS-CoV-2  public health emergency.  Safety protocols were in place, including screening questions prior to the visit, additional usage of staff PPE, and extensive cleaning of exam room while observing appropriate contact time as indicated for disinfecting solutions.    No follow-ups on file.  Orders Placed This Encounter  Procedures   Comp Met (CMET)   Hemoglobin A1c   Ambulatory referral to Neurology    Referral Priority:   Routine    Referral Type:   Consultation    Referral Reason:   Specialty Services Required    Referred to Provider:   Pieter Partridge, DO    Requested Specialty:   Neurology    Number of Visits Requested:   1    Requested Prescriptions   Signed Prescriptions Disp Refills   predniSONE (DELTASONE) 20 MG tablet 5 tablet 0    Sig: Take 1 tablet (20 mg total) by mouth daily with breakfast.

## 2021-06-04 LAB — COMPREHENSIVE METABOLIC PANEL
ALT: 13 U/L (ref 0–53)
AST: 19 U/L (ref 0–37)
Albumin: 4.3 g/dL (ref 3.5–5.2)
Alkaline Phosphatase: 63 U/L (ref 39–117)
BUN: 29 mg/dL — ABNORMAL HIGH (ref 6–23)
CO2: 22 mEq/L (ref 19–32)
Calcium: 9.2 mg/dL (ref 8.4–10.5)
Chloride: 106 mEq/L (ref 96–112)
Creatinine, Ser: 1.78 mg/dL — ABNORMAL HIGH (ref 0.40–1.50)
GFR: 45.54 mL/min — ABNORMAL LOW (ref >60.00)
Glucose, Bld: 103 mg/dL — ABNORMAL HIGH (ref 70–99)
Potassium: 4.6 mEq/L (ref 3.5–5.1)
Sodium: 138 mEq/L (ref 135–145)
Total Bilirubin: 0.4 mg/dL (ref 0.2–1.2)
Total Protein: 7 g/dL (ref 6.0–8.3)

## 2021-06-04 LAB — HEMOGLOBIN A1C: Hgb A1c MFr Bld: 6.6 % — ABNORMAL HIGH (ref 4.6–6.5)

## 2021-06-10 ENCOUNTER — Encounter: Payer: Self-pay | Admitting: Neurology

## 2021-06-14 ENCOUNTER — Telehealth: Payer: Self-pay

## 2021-06-14 NOTE — Telephone Encounter (Signed)
Patient called stating he is still having headaches.  He was was better when he was on the Prednison.  He is now off the Prednisone now as script has ended and is wondering if PCP will send in another script for it.  He stated there was some discussion with PCP about a higher dosage he could take.  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

## 2021-06-14 NOTE — Telephone Encounter (Signed)
Pt was here on  06/03/21 and he stated that the prednisone did help him with the migraines, but now they are back. It is still off and on for the last several weeks and there is now a constant dull one on top of that. He is requesting either more prednisone to help him get over this hump or a medication that can help with the onset set once it comes it is difficult for him to function. 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   Neuro appointment has been scheduled for late Sept.

## 2021-06-15 NOTE — Telephone Encounter (Signed)
I have called pt back and informed him of provider message below. Pt is now going to give them a call and be on the cancellation list so he can come in sooner. Also he is going to call his neurologist to see what they can do for him.

## 2021-06-17 NOTE — Progress Notes (Signed)
NEUROLOGY FOLLOW UP OFFICE NOTE  Robert Cruz [id number]  Assessment/Plan:   New onset headaches - probably migraine.  No associated autonomic symptoms to suggest cluster headache.  Given the severity and frequency of these headaches, I would still evaluate for aneurysm or dissection  CTA head and neck Start topiramate 25mg  QHS for one week, then 50mg  at bedtime.  We can increase dose in 5 weeks if needed Will give him samples of Ubrelvy to try until CTA is done.  If CTA does not reveal an arterial etiology, will prescribe a triptan Limit use of pain relievers to no more than 2 days out of week to prevent risk of rebound or medication-overuse headache. Keep headache diary Advised to discontinue smoking cigars for now, in case it is a trigger. Follow up 6 months.  Subjective:  . Robert Cruz is a 45 year old right-handed male with HTN and sleep apnea who follows up for TIA.   UPDATE: Current medications:  ASA 81mg , atorvastatin 40mg  daily, lisinopril 20mg  daily, HCTZ, atenolol 100mg  daily.  In June, he developed severe pounding/throbbing pain from the back of his neck and head radiating over the entire head and down the face/jaw line either side.  One time he had nausea but otherwise no nausea, vomiting, photophobia, phonophobia, osmophobia, visual disturbance, numbness or weakness or autonomic symptoms.  It would often last an hour and resolve with Excedrin but sometimes will last longer and has woken him up from sleep.  Went to the ED on 06/02/2021 where he received a Toradol injection. Headache persisted.  Followed up with his PCP the next day and was given a prednisone taper which helped but not resolved.  They occur every other day.  The only change noted is that he started smoking cigars in April.  However, it has never occurred while smoking a cigar.  He never had headaches like these before.  He does have history of different headaches which he has attributed to uncontrolled  blood pressure in the past.  Current NSAIDS/analgesics:  Excedrin Current triptans:  none Current ergotamine:  none Current anti-emetic:  none Current muscle relaxants:  none Current Antihypertensive medications:  atenolol 100mg  QD, lisinopril, HCTZ, spironolactone Current Antidepressant medications:  none Current Anticonvulsant medications:  none Current anti-CGRP:  none Current Vitamins/Herbal/Supplements:  none Current Antihistamines/Decongestants:  none Other therapy:  none Hormone/birth control:  none  HISTORY: While on vacation in [place] on 03/30/2020, he developed transient right-sided numbness and tingling involving the left arm and leg.  He was laying down and when he stood up, it did not resolve.  There was no associated headache, facial droop, extremity weakness or slurred speech.  He went to [facility] where he developed left arm and leg numbness as well and he was admitted for stroke workup. Symptoms lasted a couple of hours.  CT head which was motion-limited, showed no acute intracranial abnormality.  MRI of brain also showed no acute intracranial abnormality.  Carotid doppler revealed no hemodynamically significant stenosis.  Hgb A1c was 6.3.  LDL was 104.  He was started on ASA 81mg  and Plavix 75mg  daily and Lipitor 40mg  daily.  In hindsight, he questions if he actually had a TIA.  He said he had been drinking brown liquor and was a little intoxicated and when he developed the symptoms, he started having anxiety.  07/16/2020 CTA HEAD:  Normal.   07/28/2020 2D ECHO W BUBBLE STUDY:  LVEF 55-60%, negative bubble study.  09/04/2021  HGB A1c 6.6.  10/08/2021 LDL 64  He reports history of vertigo since around 2008-2009 that has been getting worse.  If he looks up or down, he exhibits spinning sensation with nausea and diaphoresis.  It lasts a few seconds.  He doesn't really recall how frequent it occurs.  Triggers including riding carnival rides (sky wheel)  or when he switches from contact lenses to glasses.  PAST MEDICAL HISTORY: Past Medical History:  Diagnosis Date   Chicken pox    GERD (gastroesophageal reflux disease)    Hypertension    Morbid obesity ([facility])    Sleep apnea     MEDICATIONS: Current Outpatient Medications on File Prior to Visit  Medication Sig Dispense Refill   atenolol (TENORMIN) 100 MG tablet Take 1 tablet (100 mg total) by mouth daily. 90 tablet 3   atorvastatin (LIPITOR) 40 MG tablet Take 1 tablet (40 mg total) by mouth daily. (Patient not taking: Reported on 06/03/2021) 90 tablet 3   hydrochlorothiazide (HYDRODIURIL) 25 MG tablet Take 1 tablet (25 mg total) by mouth daily. 90 tablet 3   lisinopril (ZESTRIL) 20 MG tablet Take 1 tablet (20 mg total) by mouth daily. 90 tablet 3   predniSONE (DELTASONE) 20 MG tablet Take 1 tablet (20 mg total) by mouth daily with breakfast. 5 tablet 0   spironolactone (ALDACTONE) 100 MG tablet Take 1 tablet (100 mg total) by mouth daily. 90 tablet 1   No current facility-administered medications on file prior to visit.    ALLERGIES: Allergies  Allergen Reactions   Amlodipine Other (See Comments)    edema   Bystolic [Nebivolol Hcl] Other (See Comments)    Patient stated medication gave him major headaches    FAMILY HISTORY: Family History  Problem Relation Age of Onset   Hypertension Father    Depression Father    Hypertension Mother    Kidney disease Maternal Grandmother        Dialysis   Hypertension Maternal Grandmother    Healthy Brother        x1   Healthy Sister        x3   Colon cancer Neg Hx    Esophageal cancer Neg Hx    Rectal cancer Neg Hx    Stomach cancer Neg Hx       Objective:  Blood pressure 108/72, pulse 87, height 6\' 1"  (1.854 m), weight (!) 332 lb 4 oz (150.7 kg), SpO2 97 %. General: No acute distress.  Patient appears well-groomed.   Head:  Normocephalic/atraumatic Eyes:  Fundi examined but not visualized Neck: supple, no paraspinal  tenderness, full range of motion Heart:  Regular rate and rhythm Lungs:  Clear to auscultation bilaterally Back: No paraspinal tenderness Neurological Exam: alert and oriented to person, place, and time.  Speech fluent and not dysarthric, language intact.  CN II-XII intact. Bulk and tone normal, muscle strength 5/5 throughout.  Sensation to light touch intact.  Deep tendon reflexes 2+ throughout, toes downgoing.  Finger to nose testing intact.  Gait normal, Romberg negative.   , DO  CC: Shon Millet, FNP

## 2021-06-18 ENCOUNTER — Encounter: Payer: Self-pay | Admitting: Neurology

## 2021-06-18 ENCOUNTER — Other Ambulatory Visit: Payer: Self-pay

## 2021-06-18 ENCOUNTER — Ambulatory Visit (INDEPENDENT_AMBULATORY_CARE_PROVIDER_SITE_OTHER): Payer: Managed Care, Other (non HMO) | Admitting: Neurology

## 2021-06-18 VITALS — BP 108/72 | HR 87 | Ht 73.0 in | Wt 332.2 lb

## 2021-06-18 DIAGNOSIS — R519 Headache, unspecified: Secondary | ICD-10-CM

## 2021-06-18 DIAGNOSIS — G43019 Migraine without aura, intractable, without status migrainosus: Secondary | ICD-10-CM

## 2021-06-18 DIAGNOSIS — H8110 Benign paroxysmal vertigo, unspecified ear: Secondary | ICD-10-CM

## 2021-06-18 MED ORDER — TOPIRAMATE 50 MG PO TABS
50.0000 mg | ORAL_TABLET | Freq: Every day | ORAL | 5 refills | Status: DC
Start: 2021-06-18 — End: 2021-12-10

## 2021-06-18 NOTE — Patient Instructions (Signed)
Start topiramate 50mg  - take 1/2 tablet at bedtime for one week, then increase to 1 tablet at bedtime.  If no improvement in 5 weeks, contact me and I can increase dose When you get a severe headache, take Ubrelvy 100mg .  May repeat in 2 hours.  Maximum 2 tablets in 24 hours. CTA of head and neck Limit use of pain relievers to no more than 2 days out of week to prevent risk of rebound or medication-overuse headache. Keep headache diary   Regarding your dizziness, I recommend avoiding high places or activities involving change in position (such as riding the [employer] truck) as it may aggravate your dizziness and put you at higher risk for falls.

## 2021-06-22 ENCOUNTER — Telehealth: Payer: Self-pay | Admitting: Neurology

## 2021-06-22 DIAGNOSIS — G43019 Migraine without aura, intractable, without status migrainosus: Secondary | ICD-10-CM

## 2021-06-22 DIAGNOSIS — R519 Headache, unspecified: Secondary | ICD-10-CM

## 2021-06-22 DIAGNOSIS — G459 Transient cerebral ischemic attack, unspecified: Secondary | ICD-10-CM

## 2021-06-22 NOTE — Telephone Encounter (Signed)
Message left for patient to return my call.  

## 2021-06-22 NOTE — Telephone Encounter (Signed)
Pt is returning sambath call. Said if possible, please call at 2 or after.

## 2021-06-22 NOTE — Telephone Encounter (Signed)
Pt called in and left a message stating he was told to call us back and let us know how his medication was working. He states none of the medication is working.

## 2021-06-22 NOTE — Telephone Encounter (Signed)
I have called patient back. He stated that he have already taken all of the Ubrelvy.  He stated that he taking topiramate at night as instructed but he stated he needs something in the day, the headaches are awful.

## 2021-06-22 NOTE — Telephone Encounter (Signed)
Mariam from [facility] called and said the orders need updated to: CT angio head/neck.

## 2021-06-22 NOTE — Telephone Encounter (Signed)
Re-sent as instructed.

## 2021-06-23 NOTE — Telephone Encounter (Addendum)
Spoken to patient and notified Dr Moises Blood comments to come by pick up samples of Nurtec. He will stop by this afternoon before 4:30 pm.  How many do you want to give patient?

## 2021-06-24 ENCOUNTER — Telehealth: Payer: Self-pay | Admitting: Neurology

## 2021-06-24 NOTE — Telephone Encounter (Addendum)
Patient came by the office to request a letter to be sent by fax to [facility] [employer] on [address].  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  Fax: [phone]

## 2021-06-24 NOTE — Telephone Encounter (Signed)
Patient came by and pick up 2 boxes of Nurtec 75 mg  Lot # [id number] Exp date 08/2023

## 2021-06-25 ENCOUNTER — Encounter: Payer: Self-pay | Admitting: Neurology

## 2021-06-25 NOTE — Progress Notes (Signed)
[facility] # [id number] Effective dates July 2th through January 16.2023 [employer]   Reerence code [id number]

## 2021-06-25 NOTE — Telephone Encounter (Signed)
Spoken to patient and he is on his way to pick up letter.

## 2021-06-25 NOTE — Telephone Encounter (Signed)
Patient wants to come and pick up the letter so he can take it with him  please call him at [phone]

## 2021-06-26 ENCOUNTER — Other Ambulatory Visit: Payer: Self-pay | Admitting: Cardiovascular Disease

## 2021-08-12 ENCOUNTER — Ambulatory Visit
Admission: RE | Admit: 2021-08-12 | Discharge: 2021-08-12 | Disposition: A | Discharge status: Home / Self Care | Payer: Managed Care, Other (non HMO) | Source: Ambulatory Visit | Attending: Neurology | Admitting: Neurology

## 2021-08-12 ENCOUNTER — Other Ambulatory Visit: Payer: Managed Care, Other (non HMO)

## 2021-08-12 ENCOUNTER — Other Ambulatory Visit: Payer: Self-pay

## 2021-08-12 DIAGNOSIS — G459 Transient cerebral ischemic attack, unspecified: Secondary | ICD-10-CM

## 2021-08-12 DIAGNOSIS — R519 Headache, unspecified: Secondary | ICD-10-CM

## 2021-08-12 MED ORDER — IOPAMIDOL (ISOVUE-370) INJECTION 76%
75.0000 mL | Freq: Once | INTRAVENOUS | Status: AC | PRN
  Administered 2021-08-12: 75 mL via INTRAVENOUS

## 2021-08-18 ENCOUNTER — Telehealth: Payer: Self-pay

## 2021-08-18 MED ORDER — SUMATRIPTAN SUCCINATE 100 MG PO TABS: 100.0000 mg | ORAL_TABLET | Freq: Once | ORAL | 5 refills | Status: DC | PRN

## 2021-08-18 NOTE — Telephone Encounter (Signed)
Script Sumatriptan 100 mg tab. take 1 tablet earliest onset of headache.  May repeat in 2 hours.  Maximum 2 tablets in 24 hours.

## 2021-08-18 NOTE — Telephone Encounter (Signed)
-----   Message from Drema Dallas, DO sent at 08/15/2021  3:46 PM EDT ----- Arteries in the head and neck look okay.  Nothing concerning.  I would like to prescribe him sumatriptan 100mg  tablet to take for the headaches - take 1 tablet earliest onset of headache.  May repeat in 2 hours.  Maximum 2 tablets in 24 hours.

## 2021-08-30 ENCOUNTER — Ambulatory Visit: Payer: Managed Care, Other (non HMO) | Admitting: Family Medicine

## 2021-08-30 NOTE — Progress Notes (Deleted)
   I, Philbert Riser, LAT, ATC acting as a scribe for [name], MD.  Robert Cruz is a 45 y.o. male who presents to [facility] today for L foot pain. Pt was previously seen by Dr. [name] on 09/09/20 chronic R shoulder pain. Today, pt c/o L foot pain x /. MOI:? Pt locates pain to  L foot swelling: Aggravates: Treatments tried:  Pertinent review of systems: ***  Relevant historical information: ***   Exam:  There were no vitals taken for this visit. General: Well Developed, well nourished, and in no acute distress.   MSK: ***    Lab and Radiology Results No results found for this or any previous visit (from the past 72 hour(s)). No results found.     Assessment and Plan: 45 y.o. male with ***   PDMP not reviewed this encounter. No orders of the defined types were placed in this encounter.  No orders of the defined types were placed in this encounter.    Discussed warning signs or symptoms. Please see discharge instructions. Patient expresses understanding.   ***

## 2021-08-31 ENCOUNTER — Other Ambulatory Visit: Payer: Self-pay

## 2021-08-31 ENCOUNTER — Ambulatory Visit: Payer: Self-pay

## 2021-08-31 ENCOUNTER — Ambulatory Visit (INDEPENDENT_AMBULATORY_CARE_PROVIDER_SITE_OTHER): Payer: Managed Care, Other (non HMO)

## 2021-08-31 ENCOUNTER — Ambulatory Visit: Payer: Managed Care, Other (non HMO) | Admitting: Neurology

## 2021-08-31 ENCOUNTER — Encounter: Payer: Self-pay | Admitting: Family Medicine

## 2021-08-31 ENCOUNTER — Ambulatory Visit (HOSPITAL_COMMUNITY): Payer: Managed Care, Other (non HMO)

## 2021-08-31 ENCOUNTER — Ambulatory Visit (INDEPENDENT_AMBULATORY_CARE_PROVIDER_SITE_OTHER): Payer: Managed Care, Other (non HMO) | Admitting: Family Medicine

## 2021-08-31 VITALS — BP 92/62 | HR 91 | Ht 73.0 in | Wt 332.6 lb

## 2021-08-31 DIAGNOSIS — M10072 Idiopathic gout, left ankle and foot: Secondary | ICD-10-CM | POA: Diagnosis not present

## 2021-08-31 DIAGNOSIS — M25572 Pain in left ankle and joints of left foot: Secondary | ICD-10-CM

## 2021-08-31 DIAGNOSIS — N1831 Chronic kidney disease, stage 3a: Secondary | ICD-10-CM | POA: Diagnosis not present

## 2021-08-31 LAB — BASIC METABOLIC PANEL
BUN: 25 mg/dL — ABNORMAL HIGH (ref 6–23)
CO2: 23 mEq/L (ref 19–32)
Calcium: 9.2 mg/dL (ref 8.4–10.5)
Chloride: 104 mEq/L (ref 96–112)
Creatinine, Ser: 1.59 mg/dL — ABNORMAL HIGH (ref 0.40–1.50)
GFR: 52.05 mL/min — ABNORMAL LOW (ref >60.00)
Glucose, Bld: 116 mg/dL — ABNORMAL HIGH (ref 70–99)
Potassium: 4.1 mEq/L (ref 3.5–5.1)
Sodium: 137 mEq/L (ref 135–145)

## 2021-08-31 LAB — URIC ACID: Uric Acid, Serum: 9.7 mg/dL — ABNORMAL HIGH (ref 4.0–7.8)

## 2021-08-31 MED ORDER — HYDROCODONE-ACETAMINOPHEN 5-325 MG PO TABS: 1.0000 | ORAL_TABLET | Freq: Four times a day (QID) | ORAL | 0 refills | Status: DC | PRN

## 2021-08-31 MED ORDER — COLCHICINE 0.6 MG PO TABS: 0.6000 mg | ORAL_TABLET | Freq: Every day | ORAL | 2 refills | Status: DC | PRN

## 2021-08-31 NOTE — Patient Instructions (Addendum)
Nice to meet you.  You had a L ankle injection.  Call or go to the ER if you develop a large red swollen joint with extreme pain or oozing puss.    Please get an Xray today before you leave.  Please get labs today before you leave   Suggest that you wear a walking boot as needed for pain w/ walking.  Follow-up in 2-4 weeks.

## 2021-08-31 NOTE — Progress Notes (Signed)
I, Robert Cruz, LAT, ATC, am serving as scribe for Dr. [name].  Subjective:    CC: L medial ankle pain  HPI: Pt is a 45 y/o male c/o L foot pain since Sunday when he was wearing a smaller than normal pair of Air Jordans while entertaining at his house.  Pt locates pain to his L medial ankle.  Pt has a hx of gout.  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   L foot swelling: yes at his L medial ankle Aggravates: walking; weight-bearing; climbing stairs; L ankle AROM Treatments tried: rest; prednisone leftover from prior gout; Biofreeze; IcyHot   Pertinent review of Systems: no fever or chills  Relevant historical information: gout   Objective:    Vitals:   08/31/21 1454  BP: 92/62  Pulse: 91  SpO2: 97%   General: Well Developed, well nourished, and in no acute distress.   MSK: Left ankle large joint effusion. Tender palpation medial and lateral aspect of ankle joint worse medially. Decreased ankle motion. Intact strength however painful. Pulses cap refill and sensation are intact distally.  Lab and Radiology Results  Procedure: Real-time Ultrasound Guided Injection of left ankle lateral aspect Device: Philips Affiniti 50G Images permanently stored and available for review in PACS Ultrasound evaluation prior to injection reveals large joint effusion.  Minimal tenosynovitis present medial and lateral. Verbal informed consent obtained.  Discussed risks and benefits of procedure. Warned about infection bleeding damage to structures skin hypopigmentation and fat atrophy among others. Patient expresses understanding and agreement Time-out conducted.   Noted no overlying erythema, induration, or other signs of local infection.   Skin prepped in a sterile fashion.   Local anesthesia: Topical Ethyl chloride.   With sterile technique and under real time ultrasound guidance: 40 mg of Kenalog and 2 L of lidocaine injected into ankle joint. Fluid  seen entering the joint capsule.   Completed without difficulty   Pain immediately resolved suggesting accurate placement of the medication.   Advised to call if fevers/chills, erythema, induration, drainage, or persistent bleeding.   Images permanently stored and available for review in the ultrasound unit.  Impression: Technically successful ultrasound guided injection.       Impression and Recommendations:    Assessment and Plan: 45 y.o. male with left ankle pain and swelling without clear obvious cause.  Gout is most likely explanation.  Plan for steroid injection.  Will check basic metabolic panel and uric acid as well.  Patient does have a history of CKD 3 but uric acid has not been checked in quite a while.  Likely will be adjusting or adding allopurinol and colchicine in response to lab results..  Additionally limited hydrocodone for pain control.  Recommend CAM Walker boot as needed.  Recheck back in 2 to 4 weeks.  PDMP reviewed during this encounter. Orders Placed This Encounter  Procedures   [place] LIMITED JOINT SPACE STRUCTURES LOW LEFT(NO LINKED CHARGES)    Order Specific Question:   Reason for Exam (SYMPTOM  OR DIAGNOSIS REQUIRED)    Answer:   L ankle pain    Order Specific Question:   Preferred imaging location?    Answer:   [facility] Ankle Complete Left    Standing Status:   Future    Number of Occurrences:   1    Standing Expiration Date:   09/30/2021    Order Specific Question:   Reason for  Exam (SYMPTOM  OR DIAGNOSIS REQUIRED)    Answer:   L ankle pain    Order Specific Question:   Preferred imaging location?    Answer:   [facility]   Uric acid    Standing Status:   Future    Number of Occurrences:   1    Standing Expiration Date:   08/31/2022   Basic Metabolic Panel (BMET)    Standing Status:   Future    Number of Occurrences:   1    Standing Expiration Date:   08/31/2022   Meds ordered this encounter  Medications    HYDROcodone-acetaminophen (NORCO/VICODIN) 5-325 MG tablet    Sig: Take 1 tablet by mouth every 6 (six) hours as needed.    Dispense:  10 tablet    Refill:  0   colchicine 0.6 MG tablet    Sig: Take 1 tablet (0.6 mg total) by mouth daily as needed (gout or psuedogout pain).    Dispense:  30 tablet    Refill:  2    Discussed warning signs or symptoms. Please see discharge instructions. Patient expresses understanding.   The above documentation has been reviewed and is accurate and complete [name], M.D.

## 2021-09-01 DIAGNOSIS — N1831 Chronic kidney disease, stage 3a: Secondary | ICD-10-CM | POA: Insufficient documentation

## 2021-09-01 MED ORDER — ALLOPURINOL 100 MG PO TABS: 100.0000 mg | ORAL_TABLET | Freq: Every day | ORAL | 1 refills | Status: DC

## 2021-09-01 NOTE — Addendum Note (Signed)
Addended by: Rodolph Bong on: 09/01/2021 05:56 AM   Modules accepted: Orders

## 2021-09-01 NOTE — Progress Notes (Signed)
Uric acid is significantly elevated at 9.7.  This indicates gout is extremely likely.  Kidney function has improved a little bit since 2 months ago which is great news. I have prescribed allopurinol which is a medicine you take daily to lower uric acid and ultimately prevent gout.  I sent it to the [facility] pharmacy.  Please start taking it daily.

## 2021-09-02 ENCOUNTER — Telehealth: Payer: Self-pay | Admitting: Family Medicine

## 2021-09-02 NOTE — Telephone Encounter (Signed)
It may take a few days for the steroid cortisone medicine to start working.  Please let me know if not improving on Friday.  I will refill the pain medicine on that day if not improved and we can see you again next week if needed.  Sometimes a cortisone injection takes 3 days to work maybe a little longer.

## 2021-09-02 NOTE — Progress Notes (Signed)
Left ankle x-ray shows soft tissue swelling but no broken bones.  Some midfoot arthritis is present.

## 2021-09-02 NOTE — Telephone Encounter (Signed)
Pt called, still experiencing a lot of pain in his foot. He feels maybe some improvement from the injection, but still painful when walking and hard to sleep.  Pt has not started the 2nd gout med yet and states he has been taking 1.5 pain meds every 6 hours ( instead of 1 pill ).  Looking for help/suggestions to manage the pain. Please call today.

## 2021-09-03 MED ORDER — OXYCODONE-ACETAMINOPHEN 10-325 MG PO TABS
1.0000 | ORAL_TABLET | Freq: Three times a day (TID) | ORAL | 0 refills | Status: DC | PRN
Start: 2021-09-03 — End: 2021-09-28

## 2021-09-03 NOTE — Telephone Encounter (Signed)
Called and left pt a VM relaying this information. Instructed pt that he would need to call back by noon today, in order to reach Dr. [name].

## 2021-09-03 NOTE — Telephone Encounter (Signed)
Oxycodone 10 /325 sent to [facility].  If not improved over the weekend would like to see patient in clinic next week.

## 2021-09-03 NOTE — Telephone Encounter (Signed)
Called pt, does feel a bit better today. States that pain is still a 7-8 with movement. He expected more/sooner relief from the injection.  He does not get much relief from the pain pills, even when taking 1.5 dosage. He has a few left and would be OK with a higher dose refill or an alternative med that might help more.

## 2021-09-03 NOTE — Telephone Encounter (Signed)
Pt called to inform him rx was sent in. Also scheduled pt for a f/u visit for Monday, 10/3, since his pain has not improved.

## 2021-09-06 ENCOUNTER — Other Ambulatory Visit: Payer: Self-pay

## 2021-09-06 ENCOUNTER — Ambulatory Visit (INDEPENDENT_AMBULATORY_CARE_PROVIDER_SITE_OTHER): Payer: Managed Care, Other (non HMO) | Admitting: Family Medicine

## 2021-09-06 ENCOUNTER — Ambulatory Visit: Payer: Self-pay

## 2021-09-06 VITALS — BP 132/88 | HR 83 | Ht 73.0 in | Wt 331.8 lb

## 2021-09-06 DIAGNOSIS — M25572 Pain in left ankle and joints of left foot: Secondary | ICD-10-CM

## 2021-09-06 NOTE — Patient Instructions (Signed)
Thank you for coming in today.   If not better we can do an MRI.   A cam walker boot would help.   Please go to [facility] supply to get the cam walker we talked about today. You may also be able to get it from [employer].

## 2021-09-06 NOTE — Progress Notes (Signed)
I, Philbert Riser, LAT, ATC acting as a scribe for [name], MD.  Robert Cruz is a 45 y.o. male who presents to [facility] at [facility] today for cont L ankle pain. 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 Pt was last seen by Dr. [name] on 08/31/21 and pt was given a L ankle steroid injection, labs were performed, revealing his uric acid was 9.7 and allopurinol was prescribed. Pt called the office on 09/02/21 reporting he was still in a lot of pain and was prescribed oxycodone and advised to return to clinic. Today, pt reports some improvement in pain, but his L foot/ankle is still bothering him. Pt has been taking both gout medicines.   Patient does note some mechanical symptoms including clicking and popping.  Dx testing: 08/31/21 Labs (uric acid)  08/31/21 L ankle XR  Pertinent review of systems: No fevers or chills  Relevant historical information: CKD3   Exam:  BP 132/88   Pulse 83   Ht 6\' 1"  (1.854 m)   Wt (!) 331 lb 12.8 oz (150.5 kg)   SpO2 95%   BMI 43.78 kg/m  General: Well Developed, well nourished, and in no acute distress.   MSK: Mild effusion normal motion.  Mildly tender palpation medial ankle.  Nontender at posterior tibialis tendon.  Intact strength.    Lab and Radiology Results  Diagnostic Limited MSK Ultrasound of: Left ankle Medial ankle minimal joint effusion.  Rounded bony object medial anterior ankle joint concerning for possible loose body. Posterior tibialis tendon normal-appearing Lateral ankle mild joint effusion otherwise normal. Bony structures otherwise normal-appearing Impression: Possible loose body in medial and anterior ankle.  EXAM: LEFT ANKLE COMPLETE - 3+ VIEW   COMPARISON:  04/13/2017   FINDINGS: Normal alignment.  No fracture.  Ankle mortise intact.   Bilateral soft tissue swelling.   Degenerative change in the midfoot with spurring at the talonavicular joint.    IMPRESSION: Soft tissue swelling.  Negative for fracture.     Electronically Signed   By: 06/13/2017 M.D.   On: 09/02/2021 14:41  I, 09/04/2021, personally (independently) visualized and performed the interpretation of the images attached in this note.  Concern for loose body present in the anterior ankle on lateral x-ray.  Lab Results  Component Value Date   LABURIC 9.7 (H) 08/31/2021     Assessment and Plan: 45 y.o. male with anterior medial ankle pain.  Pain most likely due to gout.  Patient had steroid injection and colchicine prescribed at the last visit and is improved today however continued to have some pain.  Plan to continue current regimen.  Can use cam walker boot as needed.  He has a follow-up visit scheduled for next week which she will keep.  If not improved would proceed to MRI to evaluate potential for loose body causing impingement in his pain.   PDMP not reviewed this encounter. Orders Placed This Encounter  Procedures   54 LIMITED JOINT SPACE STRUCTURES LOW LEFT(NO LINKED CHARGES)    Order Specific Question:   Reason for Exam (SYMPTOM  OR DIAGNOSIS REQUIRED)    Answer:   eval left ankle    Order Specific Question:   Preferred imaging location?    Answer:   [facility]   No orders of the defined types were placed in this encounter.    Discussed warning signs or symptoms. Please see discharge instructions. Patient expresses  understanding.   The above documentation has been reviewed and is accurate and complete [name], M.D.

## 2021-09-13 NOTE — Progress Notes (Deleted)
   I, [name], LAT, ATC, am serving as scribe for Dr. [name].  Robert Cruz is a 45 y.o. male who presents to [facility] at [facility] today for f/u of L ankle pain due to gout flare.  He was last seen by Dr. [name] on 09/06/21 and noted some improvement in his L foot/ankle pain but was still having fairly significant symptoms.  He had a prior L ankle steroid injection on 08/31/21 and has been taking both colchicine and allopurinol for gout.  He was also prescribed some limited oxycodone for pain.  He was advised to purchase a Cam walker boot.  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 General: Well Developed, well nourished, and in no acute distress.   MSK: ***    Lab and Radiology Results No results found for this or any previous visit (from the past 72 hour(s)). No results found.     Assessment and Plan: 45 y.o. male with ***   PDMP not reviewed this encounter. No orders of the defined types were placed in this encounter.  No orders of the defined types were placed in this encounter.    Discussed warning signs or symptoms. Please see discharge instructions. Patient expresses understanding.   ***

## 2021-09-14 ENCOUNTER — Ambulatory Visit: Payer: Managed Care, Other (non HMO) | Admitting: Family Medicine

## 2021-09-26 ENCOUNTER — Other Ambulatory Visit: Payer: Self-pay | Admitting: Family Medicine

## 2021-09-28 ENCOUNTER — Telehealth (INDEPENDENT_AMBULATORY_CARE_PROVIDER_SITE_OTHER): Payer: Managed Care, Other (non HMO) | Admitting: Family

## 2021-09-28 ENCOUNTER — Encounter: Payer: Self-pay | Admitting: Family

## 2021-09-28 ENCOUNTER — Other Ambulatory Visit: Payer: Self-pay

## 2021-09-28 VITALS — Ht 73.0 in | Wt 300.0 lb

## 2021-09-28 DIAGNOSIS — J069 Acute upper respiratory infection, unspecified: Secondary | ICD-10-CM

## 2021-09-28 MED ORDER — HYDROCODONE BIT-HOMATROP MBR 5-1.5 MG/5ML PO SOLN: 5.0000 mL | Freq: Three times a day (TID) | ORAL | 0 refills | Status: DC | PRN

## 2021-09-28 NOTE — Progress Notes (Signed)
Robert Cruz is a 45 y.o. male with the following history as recorded in EpicCare:  Patient Active Problem List   Diagnosis Date Noted   Stage 3a chronic kidney disease (HCC) 09/01/2021   Acute pain of left shoulder 10/18/2019   Left elbow pain 10/18/2019   Cough 10/03/2019   Mixed hyperlipidemia 09/08/2017   Allergic rhinitis 10/11/2016   Sore throat 10/11/2016   Chronic migraine without aura without status migrainosus, not intractable 01/14/2016   Resistant hypertension 01/04/2016   Generalized headache 12/16/2015   Encounter for preventive health examination 09/06/2013   GERD (gastroesophageal reflux disease) 09/06/2013   DYSPHAGIA UNSPECIFIED 02/08/2011   OSA (obstructive sleep apnea) 12/28/2008   Severe obesity (BMI >= 40) (HCC) 10/07/2008   Essential hypertension 08/07/2007    Current Outpatient Medications  Medication Sig Dispense Refill   atenolol (TENORMIN) 100 MG tablet Take 1 tablet by mouth once daily 90 tablet 1   atorvastatin (LIPITOR) 40 MG tablet Take 1 tablet (40 mg total) by mouth daily. 90 tablet 3   hydrochlorothiazide (HYDRODIURIL) 25 MG tablet Take 1 tablet (25 mg total) by mouth daily. 90 tablet 3   HYDROcodone bit-homatropine (HYCODAN) 5-1.5 MG/5ML syrup Take 5 mLs by mouth every 8 (eight) hours as needed for cough. 120 mL 0   lisinopril (ZESTRIL) 20 MG tablet Take 1 tablet (20 mg total) by mouth daily. 90 tablet 3   spironolactone (ALDACTONE) 100 MG tablet Take 1 tablet (100 mg total) by mouth daily. 90 tablet 1   SUMAtriptan (IMITREX) 100 MG tablet Take 1 tablet (100 mg total) by mouth once as needed for up to 1 dose for migraine. May repeat in 2 hours if headache persists or recurs. 10 tablet 5   topiramate (TOPAMAX) 50 MG tablet Take 1 tablet (50 mg total) by mouth at bedtime. 30 tablet 5   allopurinol (ZYLOPRIM) 100 MG tablet Take 1 tablet (100 mg total) by mouth daily. (Patient not taking: Reported on 09/28/2021) 90 tablet 1   colchicine 0.6 MG  tablet Take 1 tablet (0.6 mg total) by mouth daily as needed (gout or psuedogout pain). (Patient not taking: Reported on 09/28/2021) 30 tablet 2   No current facility-administered medications for this visit.    Allergies: Amlodipine and Bystolic [nebivolol hcl]  Past Medical History:  Diagnosis Date   Chicken pox    GERD (gastroesophageal reflux disease)    Hypertension    Morbid obesity (HCC)    Sleep apnea     No past surgical history on file.  Family History  Problem Relation Age of Onset   Hypertension Father    Depression Father    Hypertension Mother    Kidney disease Maternal Grandmother        Dialysis   Hypertension Maternal Grandmother    Healthy Brother        x1   Healthy Sister        x3   Colon cancer Neg Hx    Esophageal cancer Neg Hx    Rectal cancer Neg Hx    Stomach cancer Neg Hx     Social History   Tobacco Use   Smoking status: Some Days    Types: Cigars   Smokeless tobacco: Never  Substance Use Topics   Alcohol use: No    Subjective:    I connected with Robert Cruz on 09/28/21 at  8:40 AM EDT by a video enabled telemedicine application and verified that I am speaking with the correct person  using two identifiers.   I discussed the limitations of evaluation and management by telemedicine and the availability of in person appointments. The patient expressed understanding and agreed to proceed. Provider in office/ patient is at home; provider and patient are only 2 people on video call.   Entire family has had flu like symptoms since last Thursday; per patient, everyone starting to feel better; he is still struggling with lingering cough; requesting Rx for "strong cough" syrup;     Objective:  Vitals:   09/28/21 0818  Weight: 300 lb (136.1 kg)  Height: 6\' 1"  (1.854 m)    General: Well developed, well nourished, in no acute distress  Skin : Warm and dry.  Head: Normocephalic and atraumatic  Lungs: Respirations unlabored;  Neurologic:  Alert and oriented; speech intact; face symmetrical;   Assessment:  1. Viral URI with cough     Plan:  Suspected flu like illness; appears to be recovering; refill on Hycodan cough syrup; increase fluids, rest and follow up worse, no better- to consider antibiotic if symptoms persist.   Also encouraged to plan for 6 month follow up in December/ January 2023;    No follow-ups on file.  No orders of the defined types were placed in this encounter.   Requested Prescriptions   Signed Prescriptions Disp Refills   HYDROcodone bit-homatropine (HYCODAN) 5-1.5 MG/5ML syrup 120 mL 0    Sig: Take 5 mLs by mouth every 8 (eight) hours as needed for cough.

## 2021-10-07 ENCOUNTER — Encounter: Payer: Self-pay | Admitting: Pulmonary Disease

## 2021-10-08 ENCOUNTER — Encounter: Payer: Self-pay | Admitting: Pulmonary Disease

## 2021-10-08 ENCOUNTER — Other Ambulatory Visit: Payer: Self-pay

## 2021-10-08 ENCOUNTER — Ambulatory Visit (INDEPENDENT_AMBULATORY_CARE_PROVIDER_SITE_OTHER): Payer: Managed Care, Other (non HMO) | Admitting: Pulmonary Disease

## 2021-10-08 VITALS — BP 122/70 | HR 81 | Ht 73.0 in | Wt 300.0 lb

## 2021-10-08 DIAGNOSIS — G4733 Obstructive sleep apnea (adult) (pediatric): Secondary | ICD-10-CM | POA: Diagnosis not present

## 2021-10-08 NOTE — Patient Instructions (Signed)
Will have [facility] arrange for a new CPAP machine and refitting of your CPAP mask  Follow up in 5 months

## 2021-10-08 NOTE — Progress Notes (Signed)
Pulmonary, Critical Care, and Sleep Medicine  Chief Complaint  Patient presents with   Consult    Former [employer] pt for OSA. States he has been using his machine. DME is [facility]. Wants to discuss nasal pillows     Past Surgical History:  He  has no past surgical history on file.  Past Medical History:  GERD, HTN  Constitutional:  BP 122/70   Pulse 81   Ht 6\' 1"  (1.854 m)   Wt 300 lb (136.1 kg)   SpO2 97%   BMI 39.58 kg/m   Brief Summary:  Robert Cruz is a 45 y.o. male with obstructive sleep apnea.  He has a DOT license.      Subjective:   He was seen previously by Dr. Corrie Dandy.  He had sleep study in 2017 that showed severe obstructive sleep apnea.  He was started on CPAP and has used same machine since.  He was told he needed a full face mask, but would prefer a smaller mask.  He is no longer snoring, and he is sleeping through the night.  Feels rested during the day.  Not using anything to help sleep or stay awake.  Physical Exam:   Appearance - well kempt   ENMT - no sinus tenderness, no oral exudate, no LAN, Mallampati 3 airway, no stridor, 3+ tonsils  Respiratory - equal breath sounds bilaterally, no wheezing or rales  CV - s1s2 regular rate and rhythm, no murmurs  Ext - no clubbing, no edema  Skin - no rashes  Psych - normal mood and affect   Sleep Tests:  PSG 02/05/16 >> AHI 33, SpO2 low 73%; CPAP 12 cm H2O CPAP 05/26/21 to 06/24/21 >> used on 29 of 30 nights with average 5 hrs 27 min.  Average AHI 2.6 with CPAP 12 cm H2O  Cardiac Tests:  Echo 07/28/20 >> EF 55 to 60%, grade 2 DD  Social History:  He  reports that he has been smoking cigars. He has never used smokeless tobacco. He reports that he does not drink alcohol and does not use drugs.  Family History:  His family history includes Depression in his father; Healthy in his brother and sister; Hypertension in his father, maternal grandmother, and mother; Kidney disease in his maternal  grandmother.     Assessment/Plan:   Obstructive sleep apnea. - he is compliant with CPAP and reports benefit from therapy - he uses [facility] for his DME - his current machine is more than 45 yrs old - will arrange for new CPAP at 12 cm H2O - will arrange for mask refitting  Obesity. - he is aware of how his weight can impact his health, particularly with regard to sleep apnea  Time Spent Involved in Patient Care on Day of Examination:  21 minutes  Follow up:   Patient Instructions  Will have [facility] arrange for a new CPAP machine and refitting of your CPAP mask  Follow up in 5 months  Medication List:   Allergies as of 10/08/2021       Reactions   Amlodipine Other (See Comments)   edema   Bystolic [nebivolol Hcl] Other (See Comments)   Patient stated medication gave him major headaches        Medication List        Accurate as of October 08, 2021  4:33 PM. If you have any questions, ask your nurse or doctor.  STOP taking these medications    allopurinol 100 MG tablet Commonly known as: Zyloprim Stopped by: Coralyn Helling, MD   colchicine 0.6 MG tablet Stopped by: Coralyn Helling, MD       TAKE these medications    atenolol 100 MG tablet Commonly known as: TENORMIN Take 1 tablet by mouth once daily   atorvastatin 40 MG tablet Commonly known as: LIPITOR Take 1 tablet (40 mg total) by mouth daily.   hydrochlorothiazide 25 MG tablet Commonly known as: HYDRODIURIL Take 1 tablet (25 mg total) by mouth daily.   HYDROcodone bit-homatropine 5-1.5 MG/5ML syrup Commonly known as: HYCODAN Take 5 mLs by mouth every 8 (eight) hours as needed for cough.   lisinopril 20 MG tablet Commonly known as: ZESTRIL Take 1 tablet (20 mg total) by mouth daily.   spironolactone 100 MG tablet Commonly known as: ALDACTONE Take 1 tablet (100 mg total) by mouth daily.   SUMAtriptan 100 MG tablet Commonly known as: IMITREX Take 1 tablet (100 mg total) by mouth once  as needed for up to 1 dose for migraine. May repeat in 2 hours if headache persists or recurs.   topiramate 50 MG tablet Commonly known as: Topamax Take 1 tablet (50 mg total) by mouth at bedtime.        Signature:  Coralyn Helling, MD [facility] Pulmonary/Critical Care Pager - [phone] 10/08/2021, 4:33 PM

## 2021-10-12 ENCOUNTER — Other Ambulatory Visit: Payer: Self-pay

## 2021-10-12 ENCOUNTER — Encounter (HOSPITAL_BASED_OUTPATIENT_CLINIC_OR_DEPARTMENT_OTHER): Payer: Self-pay

## 2021-10-12 DIAGNOSIS — F17290 Nicotine dependence, other tobacco product, uncomplicated: Secondary | ICD-10-CM | POA: Insufficient documentation

## 2021-10-12 DIAGNOSIS — K0889 Other specified disorders of teeth and supporting structures: Secondary | ICD-10-CM | POA: Diagnosis present

## 2021-10-12 DIAGNOSIS — Z79899 Other long term (current) drug therapy: Secondary | ICD-10-CM | POA: Insufficient documentation

## 2021-10-12 DIAGNOSIS — N1831 Chronic kidney disease, stage 3a: Secondary | ICD-10-CM | POA: Insufficient documentation

## 2021-10-12 DIAGNOSIS — I129 Hypertensive chronic kidney disease with stage 1 through stage 4 chronic kidney disease, or unspecified chronic kidney disease: Secondary | ICD-10-CM | POA: Insufficient documentation

## 2021-10-12 MED ORDER — OXYCODONE-ACETAMINOPHEN 5-325 MG PO TABS
1.0000 | ORAL_TABLET | ORAL | Status: AC | PRN
  Administered 2021-10-12 – 2021-10-13 (×2): 1 via ORAL
  Filled 2021-10-12 (×2): qty 1

## 2021-10-12 NOTE — ED Triage Notes (Signed)
Pt  presents with pain to his upper Left wisdom tooth x3 days

## 2021-10-13 ENCOUNTER — Emergency Department (HOSPITAL_BASED_OUTPATIENT_CLINIC_OR_DEPARTMENT_OTHER)
Admission: EM | Admit: 2021-10-13 | Discharge: 2021-10-13 | Disposition: A | Discharge status: Home / Self Care | Payer: Managed Care, Other (non HMO) | Attending: Emergency Medicine | Admitting: Emergency Medicine

## 2021-10-13 DIAGNOSIS — K0889 Other specified disorders of teeth and supporting structures: Secondary | ICD-10-CM

## 2021-10-13 MED ORDER — PENICILLIN V POTASSIUM 500 MG PO TABS: 500.0000 mg | ORAL_TABLET | Freq: Four times a day (QID) | ORAL | 0 refills | Status: AC

## 2021-10-13 MED ORDER — KETOROLAC TROMETHAMINE 30 MG/ML IJ SOLN
30.0000 mg | Freq: Once | INTRAMUSCULAR | Status: AC
  Administered 2021-10-13: 30 mg via INTRAMUSCULAR
  Filled 2021-10-13: qty 1

## 2021-10-13 MED ORDER — IBUPROFEN 600 MG PO TABS: 600.0000 mg | ORAL_TABLET | Freq: Four times a day (QID) | ORAL | 0 refills | Status: DC | PRN

## 2021-10-13 NOTE — Discharge Instructions (Signed)
You were seen today for dental pain.  This is likely related to an occult infection.  Take antibiotics as prescribed.  Use ibuprofen every 6 hours for pain.  You need to follow-up with an oral surgeon.

## 2021-10-13 NOTE — ED Provider Notes (Signed)
[facility] EMERGENCY DEPT Provider Note   CSN: [id number] Arrival date & time: 10/12/21  2135     History Chief Complaint  Patient presents with   Dental Pain    Robert Cruz is a 45 y.o. male.  HPI     Is a 45 year old male with a history of hypertension and obesity who presents with dental pain.  Patient reports 3-day history of worsening left upper wisdom tooth pain.  Rates his pain at 10 out of 10.  He has been using BC powder and ibuprofen at home with minimal relief.  No fevers.  No difficulty swallowing.  No recent dental procedures.  He states that he has not seen his dentist but was told several months ago that he needed to have the tooth pulled.  Past Medical History:  Diagnosis Date   Chicken pox    GERD (gastroesophageal reflux disease)    Hypertension    Morbid obesity (HCC)    Sleep apnea     Patient Active Problem List   Diagnosis Date Noted   Stage 3a chronic kidney disease (HCC) 09/01/2021   Acute pain of left shoulder 10/18/2019   Left elbow pain 10/18/2019   Cough 10/03/2019   Mixed hyperlipidemia 09/08/2017   Allergic rhinitis 10/11/2016   Sore throat 10/11/2016   Chronic migraine without aura without status migrainosus, not intractable 01/14/2016   Resistant hypertension 01/04/2016   Generalized headache 12/16/2015   Encounter for preventive health examination 09/06/2013   GERD (gastroesophageal reflux disease) 09/06/2013   DYSPHAGIA UNSPECIFIED 02/08/2011   OSA (obstructive sleep apnea) 12/28/2008   Severe obesity (BMI >= 40) (HCC) 10/07/2008   Essential hypertension 08/07/2007    No past surgical history on file.     Family History  Problem Relation Age of Onset   Hypertension Father    Depression Father    Hypertension Mother    Kidney disease Maternal Grandmother        Dialysis   Hypertension Maternal Grandmother    Healthy Brother        x1   Healthy Sister        x3   Colon cancer Neg Hx    Esophageal  cancer Neg Hx    Rectal cancer Neg Hx    Stomach cancer Neg Hx     Social History   Tobacco Use   Smoking status: Some Days    Types: Cigars   Smokeless tobacco: Never  Vaping Use   Vaping Use: Never used  Substance Use Topics   Alcohol use: No   Drug use: No    Home Medications Prior to Admission medications   Medication Sig Start Date End Date Taking? Authorizing Provider  ibuprofen (ADVIL) 600 MG tablet Take 1 tablet (600 mg total) by mouth every 6 (six) hours as needed. 10/13/21  Yes Clary Meeker, Mayer Masker, MD  penicillin v potassium (VEETID) 500 MG tablet Take 1 tablet (500 mg total) by mouth 4 (four) times daily for 10 days. 10/13/21 10/23/21 Yes Omie Ferger, Mayer Masker, MD  atenolol (TENORMIN) 100 MG tablet Take 1 tablet by mouth once daily 06/28/21   Nahser, Deloris Ping, MD  atorvastatin (LIPITOR) 40 MG tablet Take 1 tablet (40 mg total) by mouth daily. 05/07/21   Nahser, Deloris Ping, MD  hydrochlorothiazide (HYDRODIURIL) 25 MG tablet Take 1 tablet (25 mg total) by mouth daily. 05/07/21   Nahser, Deloris Ping, MD  HYDROcodone bit-homatropine (HYCODAN) 5-1.5 MG/5ML syrup Take 5 mLs by mouth every 8 (eight)  hours as needed for cough. 09/28/21   Marrian Salvage, FNP  lisinopril (ZESTRIL) 20 MG tablet Take 1 tablet (20 mg total) by mouth daily. 05/07/21   Nahser, Wonda Cheng, MD  spironolactone (ALDACTONE) 100 MG tablet Take 1 tablet (100 mg total) by mouth daily. 05/07/21   Nahser, Wonda Cheng, MD  SUMAtriptan (IMITREX) 100 MG tablet Take 1 tablet (100 mg total) by mouth once as needed for up to 1 dose for migraine. May repeat in 2 hours if headache persists or recurs. 08/18/21   Pieter Partridge, DO  topiramate (TOPAMAX) 50 MG tablet Take 1 tablet (50 mg total) by mouth at bedtime. 06/18/21   Pieter Partridge, DO    Allergies    Amlodipine and Bystolic [nebivolol hcl]  Review of Systems   Review of Systems  Constitutional:  Negative for fever.  HENT:  Positive for dental problem. Negative for trouble  swallowing.   All other systems reviewed and are negative.  Physical Exam Updated Vital Signs BP 135/85 (BP Location: Right Arm)   Pulse 82   Temp 98.1 F (36.7 C)   Resp 18   SpO2 100%   Physical Exam Vitals and nursing note reviewed.  Constitutional:      Appearance: He is well-developed. He is obese. He is not ill-appearing.  HENT:     Head: Normocephalic and atraumatic.     Nose: Nose normal.     Mouth/Throat:     Comments: No trismus, no fullness noted on the tongue, no palpable abscess, impacted and rotated left upper wisdom tooth noted Eyes:     Pupils: Pupils are equal, round, and reactive to light.  Cardiovascular:     Rate and Rhythm: Normal rate and regular rhythm.  Pulmonary:     Effort: Pulmonary effort is normal. No respiratory distress.  Abdominal:     Palpations: Abdomen is soft.  Musculoskeletal:     Cervical back: Neck supple.  Lymphadenopathy:     Cervical: No cervical adenopathy.  Skin:    General: Skin is warm and dry.  Neurological:     Mental Status: He is alert and oriented to person, place, and time.  Psychiatric:        Mood and Affect: Mood normal.    ED Results / Procedures / Treatments   Labs (all labs ordered are listed, but only abnormal results are displayed) Labs Reviewed - No data to display  EKG None  Radiology No results found.  Procedures Procedures   Medications Ordered in ED Medications  oxyCODONE-acetaminophen (PERCOCET/ROXICET) 5-325 MG per tablet 1 tablet (1 tablet Oral Given 10/12/21 2211)  ketorolac (TORADOL) 30 MG/ML injection 30 mg (has no administration in time range)    ED Course  I have reviewed the triage vital signs and the nursing notes.  Pertinent labs & imaging results that were available during my care of the patient were reviewed by me and considered in my medical decision making (see chart for details).    MDM Rules/Calculators/A&P                           Patient presents with dental pain.   Nontoxic and vital signs are reassuring.  Low suspicion for deep space infection or Ludwigs angina.  Suspect occult infection versus impaction.  Will start on penicillin.  Patient was given IM Toradol.  Recommend scheduled 600 mg ibuprofen every 6 hours and oral surgery follow-up.  After history, exam, and  medical workup I feel the patient has been appropriately medically screened and is safe for discharge home. Pertinent diagnoses were discussed with the patient. Patient was given return precautions.  Final Clinical Impression(s) / ED Diagnoses Final diagnoses:  Pain, dental    Rx / DC Orders ED Discharge Orders          Ordered    penicillin v potassium (VEETID) 500 MG tablet  4 times daily        10/13/21 0118    ibuprofen (ADVIL) 600 MG tablet  Every 6 hours PRN        10/13/21 0118             Merryl Hacker, MD 10/13/21 0121

## 2021-11-18 ENCOUNTER — Ambulatory Visit (INDEPENDENT_AMBULATORY_CARE_PROVIDER_SITE_OTHER): Payer: Managed Care, Other (non HMO)

## 2021-11-18 ENCOUNTER — Other Ambulatory Visit: Payer: Self-pay

## 2021-11-18 ENCOUNTER — Encounter: Payer: Self-pay | Admitting: Family

## 2021-11-18 DIAGNOSIS — Z23 Encounter for immunization: Secondary | ICD-10-CM

## 2021-11-18 MED ORDER — ATENOLOL 100 MG PO TABS: 100.0000 mg | ORAL_TABLET | Freq: Every day | ORAL | 0 refills | Status: DC

## 2021-11-18 MED ORDER — SPIRONOLACTONE 100 MG PO TABS: 100.0000 mg | ORAL_TABLET | Freq: Every day | ORAL | 0 refills | Status: DC

## 2021-12-07 ENCOUNTER — Telehealth: Payer: Self-pay | Admitting: Gastroenterology

## 2021-12-07 NOTE — Telephone Encounter (Signed)
Called and spoke with patient. He states that diarrhea started 3 days ago. Pt reports that he had 4 episodes of watery diarrhea yesterday. Pt states that he took Kaopectate yesterday which turned his stool black. He denies any BRB, but he does have rectal discomfort. Pt thinks that he may be developing hemorrhoids from the diarrhea. Pt states that he has been using tucks pads and wipes after his BM. Pt started Imodium yesterday and he only ha 2 stools this morning, they were more solid stools. He states that his appetite is normal. No fever. I advised pt to continue Imodium as directed, watch his diet, start fiber supplement OTC, and keep follow up appt as scheduled. Pt is aware that they may order stool studies. Pt verbalized understanding and had no concerns at the end of the call.

## 2021-12-07 NOTE — Telephone Encounter (Signed)
Inbound call from patient stating he has diarrhea since 12/04/21.  Has tried otc medication but have not worked.  He scheduled an appt for 12/10/21 but is wanting to know if there is anything he can do in the meantime.  Please advise.

## 2021-12-10 ENCOUNTER — Ambulatory Visit (INDEPENDENT_AMBULATORY_CARE_PROVIDER_SITE_OTHER): Payer: Managed Care, Other (non HMO) | Admitting: Gastroenterology

## 2021-12-10 ENCOUNTER — Encounter: Payer: Self-pay | Admitting: Gastroenterology

## 2021-12-10 VITALS — BP 122/80 | HR 95 | Ht 73.0 in | Wt 332.0 lb

## 2021-12-10 DIAGNOSIS — R197 Diarrhea, unspecified: Secondary | ICD-10-CM | POA: Diagnosis not present

## 2021-12-10 DIAGNOSIS — Z1211 Encounter for screening for malignant neoplasm of colon: Secondary | ICD-10-CM

## 2021-12-10 MED ORDER — NA SULFATE-K SULFATE-MG SULF 17.5-3.13-1.6 GM/177ML PO SOLN: 1.0000 | Freq: Once | ORAL | 0 refills | Status: AC

## 2021-12-10 NOTE — Patient Instructions (Signed)
Your provider has requested that you go to the basement level for lab work before leaving today. Press "B" on the elevator. The lab is located at the first door on the left as you exit the elevator.  You have been scheduled for a colonoscopy. Please follow written instructions given to you at your visit today.  Please pick up your prep supplies at the pharmacy within the next 1-3 days. If you use inhalers (even only as needed), please bring them with you on the day of your procedure.  If you are age 15 or older, your body mass index should be between 23-30. Your Body mass index is 43.8 kg/m. If this is out of the aforementioned range listed, please consider follow up with your Primary Care Provider.  If you are age 40 or younger, your body mass index should be between 19-25. Your Body mass index is 43.8 kg/m. If this is out of the aformentioned range listed, please consider follow up with your Primary Care Provider.   ________________________________________________________  The [facility] providers would like to encourage you to use [facility] to communicate with providers for non-urgent requests or questions.  Due to long hold times on the telephone, sending your provider a message by [facility] may be a faster and more efficient way to get a response.  Please allow 48 business hours for a response.  Please remember that this is for non-urgent requests.  _______________________________________________________

## 2021-12-10 NOTE — Progress Notes (Signed)
12/10/2021 Robert Cruz [id number] 08/21/1976   HISTORY OF PRESENT ILLNESS: This is a 46 year old male who is a patient of Dr. Corena Pilgrim.  Last seen by him in August 2020 for GERD.  He presents here today with complaints of diarrhea.  He tells me that this began very suddenly on Sunday.  He started having very watery stools.  Prior to that he was feeling fine.  There is no associated nausea, vomiting, fever, chills, abdominal pain.  His appetite continues to be well.  He has been using Imodium and that does help but once it wears off the loose stools tend to start again.  There is but no rectal bleeding.  Had tried Kaopectate in the beginning as well.  Only other symptom is being very gassy.   Past Medical History:  Diagnosis Date   Chicken pox    GERD (gastroesophageal reflux disease)    Hypertension    Morbid obesity ([place])    Sleep apnea    History reviewed. No pertinent surgical history.  reports that he has been smoking cigars. He has never used smokeless tobacco. He reports that he does not drink alcohol and does not use drugs. family history includes Depression in his father; Healthy in his brother and sister; Hypertension in his father, maternal grandmother, and mother; Kidney disease in his maternal grandmother. Allergies  Allergen Reactions   Amlodipine Other (See Comments)    edema   Bystolic [Nebivolol Hcl] Other (See Comments)    Patient stated medication gave him major headaches      Outpatient Encounter Medications as of 12/10/2021  Medication Sig   atenolol (TENORMIN) 100 MG tablet Take 1 tablet (100 mg total) by mouth daily.   atorvastatin (LIPITOR) 40 MG tablet Take 1 tablet (40 mg total) by mouth daily.   hydrochlorothiazide (HYDRODIURIL) 25 MG tablet Take 1 tablet (25 mg total) by mouth daily.   HYDROcodone bit-homatropine (HYCODAN) 5-1.5 MG/5ML syrup Take 5 mLs by mouth every 8 (eight) hours as needed for cough.   lisinopril (ZESTRIL) 20 MG tablet Take 1  tablet (20 mg total) by mouth daily.   spironolactone (ALDACTONE) 100 MG tablet Take 1 tablet (100 mg total) by mouth daily.   [DISCONTINUED] ibuprofen (ADVIL) 600 MG tablet Take 1 tablet (600 mg total) by mouth every 6 (six) hours as needed.   [DISCONTINUED] SUMAtriptan (IMITREX) 100 MG tablet Take 1 tablet (100 mg total) by mouth once as needed for up to 1 dose for migraine. May repeat in 2 hours if headache persists or recurs.   [DISCONTINUED] topiramate (TOPAMAX) 50 MG tablet Take 1 tablet (50 mg total) by mouth at bedtime.   No facility-administered encounter medications on file as of 12/10/2021.    REVIEW OF SYSTEMS  : All other systems reviewed and negative except where noted in the History of Present Illness.   PHYSICAL EXAM: BP 122/80    Pulse 95    Ht 6\' 1"  (1.854 m)    Wt (!) 332 lb (150.6 kg)    SpO2 99%    BMI 43.80 kg/m  General: Well developed AA male in no acute distress Head: Normocephalic and atraumatic Eyes:  Sclerae anicteric, conjunctiva pink. Ears: Normal auditory acuity Lungs: Clear throughout to auscultation; no W/R/R. Heart: Regular rate and rhythm; no M/R/G. Abdomen: Soft, non-distended.  BS present.  Non-tender. Rectal:  Will be done at the time of colonoscopy. Musculoskeletal: Symmetrical with no gross deformities  Skin: No lesions on visible extremities Extremities:  No edema  Neurological: Alert oriented x 4, grossly non-focal Psychological:  Alert and cooperative. Normal mood and affect  ASSESSMENT AND PLAN: *Diarrhea: This just began suddenly earlier this week.  Prior to that he was feeling well, no diarrhea.  Suspect infectious origin.  He has improved.  No associated nausea, vomiting, fever, abdominal pain, rectal bleeding.  I think that he will continue to improve more with more time.  Certainly if the diarrhea were to worsen again then we can check stool studies and I can place an order for that.  Otherwise I think he should give it more time.  Can use  Imodium as needed. *CRC screening:  Never had a colonoscopy in the past.  Will schedule with Dr. Loletha Carrow.  The risks, benefits, and alternatives to colonoscopy were discussed with the patient and he consents to proceed.    CC:  Marrian Salvage,*

## 2021-12-14 ENCOUNTER — Ambulatory Visit: Payer: Managed Care, Other (non HMO) | Admitting: Family

## 2021-12-16 ENCOUNTER — Encounter: Payer: Self-pay | Admitting: Gastroenterology

## 2021-12-16 DIAGNOSIS — R197 Diarrhea, unspecified: Secondary | ICD-10-CM | POA: Insufficient documentation

## 2021-12-17 NOTE — Progress Notes (Signed)
____________________________________________________________  Attending physician addendum:  Thank you for sending this case to me. I have reviewed the entire note and agree with the plan.  Agreed acute diarrhea likely infectious. If not resolved in about a week, GI pathogen panel.  Amada Jupiter, MD  ____________________________________________________________

## 2021-12-20 ENCOUNTER — Emergency Department (HOSPITAL_BASED_OUTPATIENT_CLINIC_OR_DEPARTMENT_OTHER)
Admission: EM | Admit: 2021-12-20 | Discharge: 2021-12-20 | Disposition: A | Discharge status: Home / Self Care | Payer: Managed Care, Other (non HMO) | Attending: Emergency Medicine | Admitting: Emergency Medicine

## 2021-12-20 ENCOUNTER — Other Ambulatory Visit: Payer: Self-pay

## 2021-12-20 ENCOUNTER — Other Ambulatory Visit: Payer: Self-pay | Admitting: Cardiovascular Disease

## 2021-12-20 ENCOUNTER — Encounter (HOSPITAL_BASED_OUTPATIENT_CLINIC_OR_DEPARTMENT_OTHER): Payer: Self-pay | Admitting: Emergency Medicine

## 2021-12-20 DIAGNOSIS — M79672 Pain in left foot: Secondary | ICD-10-CM | POA: Insufficient documentation

## 2021-12-20 MED ORDER — METHYLPREDNISOLONE SODIUM SUCC 125 MG IJ SOLR
125.0000 mg | Freq: Once | INTRAMUSCULAR | Status: AC
Start: 2021-12-20 — End: 2021-12-20
  Administered 2021-12-20: 125 mg via INTRAVENOUS
  Filled 2021-12-20: qty 2

## 2021-12-20 MED ORDER — KETOROLAC TROMETHAMINE 60 MG/2ML IM SOLN
60.0000 mg | Freq: Once | INTRAMUSCULAR | Status: AC
  Administered 2021-12-20: 60 mg via INTRAMUSCULAR
  Filled 2021-12-20: qty 2

## 2021-12-20 MED ORDER — PREDNISONE 20 MG PO TABS: ORAL_TABLET | ORAL | 0 refills | Status: DC

## 2021-12-20 MED ORDER — PANTOPRAZOLE SODIUM 20 MG PO TBEC: 20.0000 mg | DELAYED_RELEASE_TABLET | Freq: Every day | ORAL | 0 refills | Status: DC

## 2021-12-20 NOTE — ED Provider Notes (Signed)
[facility] EMERGENCY DEPARTMENT Provider Note   CSN: [id number] Arrival date & time: 12/20/21  0040     History  Chief Complaint  Patient presents with   Foot Pain    Robert Cruz is a 46 y.o. male.  46 year old male with a history of gout the presents to the emergency department today with left ankle pain.  Patient states this is where he had gout in the past.  He has not been taking allopurinol like he supposed to.  He states that he does not really watch his diet like he supposed to either.  No new medications.  Patient states he was diagnosed via blood test couple years ago and had a couple flares since then either in his toe or his ankle.  States it feels exactly similar to that.  Has not tried any for the symptoms at this time.  No fevers.  No other associated symptoms.   Foot Pain      Home Medications Prior to Admission medications   Medication Sig Start Date End Date Taking? Authorizing Provider  pantoprazole (PROTONIX) 20 MG tablet Take 1 tablet (20 mg total) by mouth daily for 14 days. While taking diclofenac and prednisone 12/20/21 01/03/22 Yes Harumi Yamin, Barbara Cower, MD  predniSONE (DELTASONE) 20 MG tablet 3 tabs po daily x 3 days, then 2 tabs x 3 days, then 1.5 tabs x 3 days, then 1 tab x 3 days, then 0.5 tabs x 3 days 12/20/21  Yes Esther Broyles, Barbara Cower, MD  atenolol (TENORMIN) 100 MG tablet Take 1 tablet (100 mg total) by mouth daily. 11/18/21   Nahser, Deloris Ping, MD  atorvastatin (LIPITOR) 40 MG tablet Take 1 tablet (40 mg total) by mouth daily. 05/07/21   Nahser, Deloris Ping, MD  hydrochlorothiazide (HYDRODIURIL) 25 MG tablet Take 1 tablet (25 mg total) by mouth daily. 05/07/21   Nahser, Deloris Ping, MD  HYDROcodone bit-homatropine (HYCODAN) 5-1.5 MG/5ML syrup Take 5 mLs by mouth every 8 (eight) hours as needed for cough. 09/28/21   Olive Bass, FNP  lisinopril (ZESTRIL) 20 MG tablet Take 1 tablet (20 mg total) by mouth daily. 05/07/21   Nahser, Deloris Ping, MD   spironolactone (ALDACTONE) 100 MG tablet Take 1 tablet (100 mg total) by mouth daily. 11/18/21   Nahser, Deloris Ping, MD      Allergies    Amlodipine and Bystolic [nebivolol hcl]    Review of Systems   Review of Systems  Physical Exam Updated Vital Signs BP 125/76 (BP Location: Left Arm)    Pulse 92    Temp 98.2 F (36.8 C) (Oral)    Resp 16    Ht 6\' 1"  (1.854 m)    Wt (!) 150.6 kg    SpO2 99%    BMI 43.80 kg/m  Physical Exam Vitals and nursing note reviewed.  Constitutional:      Appearance: He is well-developed.  HENT:     Head: Normocephalic and atraumatic.     Nose: No congestion or rhinorrhea.     Mouth/Throat:     Mouth: Mucous membranes are moist.     Pharynx: Oropharynx is clear.  Eyes:     Pupils: Pupils are equal, round, and reactive to light.  Cardiovascular:     Rate and Rhythm: Normal rate.  Pulmonary:     Effort: Pulmonary effort is normal. No respiratory distress.  Abdominal:     General: There is no distension.  Musculoskeletal:        General: Tenderness (  with ROM of left ankle and ttp) present. Normal range of motion.     Cervical back: Normal range of motion.  Skin:    General: Skin is warm.  Neurological:     General: No focal deficit present.     Mental Status: He is alert.    ED Results / Procedures / Treatments   Labs (all labs ordered are listed, but only abnormal results are displayed) Labs Reviewed - No data to display  EKG None  Radiology No results found.  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  Will treat for same.  Already has some diclofenac at home but he prefers to use.  We will go and  do steroid taper as well.  No indication for antibiotics or narcotic pain medication at this time.  Low suspicion for septic arthritis, traumatic arthritis or osteoarthritis.        Final Clinical Impression(s) / ED Diagnoses Final diagnoses:  Foot pain, left    Rx / DC Orders ED Discharge Orders          Ordered    predniSONE (DELTASONE) 20 MG tablet        12/20/21 0320    pantoprazole (PROTONIX) 20 MG tablet  Daily        12/20/21 0320              Nuri Larmer, Barbara Cower, MD 12/20/21 [phone]

## 2021-12-20 NOTE — ED Notes (Signed)
Patient discharged to home.  All discharge instructions reviewed.  Patient verbalized understanding via teachback method.  VS WDL.  Respirations even and unlabored.  Ambulatory out of ED.   °

## 2021-12-20 NOTE — ED Triage Notes (Signed)
Pt presents to ED Pov. Pt c/o L foot pain. Pt reports hx of gout. Pt noncompliant w/ gout meds.

## 2021-12-30 ENCOUNTER — Encounter: Payer: Self-pay | Admitting: Cardiovascular Disease

## 2021-12-30 NOTE — Progress Notes (Signed)
Cardiology Office Note   Date:  12/31/2021   ID:  Robert Cruz, DOB [birth year], MRN [id number]  PCP:  Olive Bass, FNP  Cardiologist:   Kristeen Miss, MD   Chief Complaint  Patient presents with   Hypertension        Problem List 1. Essential HTN 2. Obstructive sleep apnea 3, GERD    Previous notes:  Robert Cruz is a 46 y.o. male who presents for evaluation of his HTN He has been on multiple meds with minimal response  Very busy at work .    Not a lot of time to exercise  Has been on a better diet for the past 1 year  or so.  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  No DOE doing fairly moderate exercise ( ie no dyspnea walking up the stadium steps at the [employer] a month ago )  Has not lost any weight on his new diet.   Typically break fast - eggs, [place] sausage /turkey bacon  Lunch - subway - 6 inch or salad   - used to eat lots of fried foods, [employer], chick fil a  Dinner - spag. Occasionally take out from [employer]   Apr 05, 2016:  Robert Cruz is seen back today for follow up of his HTN Echo shows normal LV function with moderate LAE  Has tried clonidine -  Was not effective so it was stopped  Hydralazine was substituted.  BP is still high  Has tried Bystolic - caused a severe headache Still drinking lots of sweet tea Lots of juices. Still eats out quite a bit .  Weight is 343 today .  I advised him to lose 5-10 lbs before his next visit .   May 09, 2016  weight is 341 today  BP is elevated.  Has OSA - has been using CPAP , may need some adjustment on the mask gasket  Has been working lots - worked 24 straight days  Avoiding salt    Oct. 18, 2017:  Robert Cruz is seen back for office visit for HTN Has done well on the Aldactone Feeling better.  Headaches have resolved,   Leg edema has resolved.   09/08/2017 Works now for [employer].  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 Still drinking sweet tea.   Wt = 334 ( down 6 lbs from last year)  Having some abd. Pain after eating  Symptoms sound like esophageal stricture /   hiatal hernia / or  GERD  Has had esophageal dilitation in the past.  Has cut out greasy foods.   Does eat some bacon on occasion.   September 26, 2018:  Robert Cruz is seen today for follow-up visit.  He has a history of morbid obesity, hypertension, hyperlipidemia.  His weight today is 345 pounds 11 pounds from last year. Trying to avoid salt .  Is not drinking sweat tea .  Will be seeing a nutritiionist this week .  No CP or dyspnea.  Working for [employer]   February 25, 2020:   Doing well Wt is down 35 lbs from 2019 Is exercising  Some .  Is married  December 11, 2020: Robert Cruz is seen today for follow-up visit.  Vital signs look good.  His weight  today is 327 pounds   Jan. 27, 2023 Robert HuaDavid is seen today for follow up of his obesity, HTN,HLD 339 lbs today   Eating too much bread.  Not exercising    Past Medical History:  Diagnosis Date   Chicken pox    GERD (gastroesophageal reflux disease)    Gout 2022   Hypertension    Morbid obesity ([facility])    Sleep apnea     History reviewed. No pertinent surgical history.   Current Outpatient Medications  Medication Sig Dispense Refill   atenolol (TENORMIN) 100 MG tablet Take 1 tablet (100 mg total) by mouth daily. 30 tablet 0   atorvastatin (LIPITOR) 40 MG tablet Take 1 tablet (40 mg total) by mouth daily. 90 tablet 3   hydrochlorothiazide (HYDRODIURIL) 25 MG tablet Take 1 tablet (25 mg total) by mouth daily. 90 tablet 3   lisinopril (ZESTRIL) 20 MG tablet Take 1 tablet (20 mg total) by mouth daily. 90 tablet 3   spironolactone (ALDACTONE) 100 MG tablet TAKE 1 TABLET BY MOUTH EVERY DAY 30 tablet 0   HYDROcodone bit-homatropine (HYCODAN) 5-1.5 MG/5ML syrup Take 5 mLs by mouth every 8 (eight) hours as needed  for cough. (Patient not taking: Reported on 12/31/2021) 120 mL 0   pantoprazole (PROTONIX) 20 MG tablet Take 1 tablet (20 mg total) by mouth daily for 14 days. While taking diclofenac and prednisone (Patient not taking: Reported on 12/31/2021) 14 tablet 0   predniSONE (DELTASONE) 20 MG tablet 3 tabs po daily x 3 days, then 2 tabs x 3 days, then 1.5 tabs x 3 days, then 1 tab x 3 days, then 0.5 tabs x 3 days (Patient not taking: Reported on 12/31/2021) 27 tablet 0   No current facility-administered medications for this visit.    Allergies:   Amlodipine and Bystolic [nebivolol hcl]    Social History:  The patient  reports that he has been smoking cigars. He has never used smokeless tobacco. He reports that he does not drink alcohol and does not use drugs.   Family History:  The patient's family history includes Depression in his father; Healthy in his brother and sister; Hypertension in his father, maternal grandmother, and mother; Kidney disease in his maternal grandmother.    ROS:  Please see the history of present illness.   Physical Exam: Blood pressure 120/82, pulse 85, height 6\' 2"  (1.88 m), weight (!) 339 lb 12.8 oz (154.1 kg), SpO2 94 %.  GEN:  Well nourished, well developed in no acute distress HEENT: Normal NECK: No JVD; No carotid bruits LYMPHATICS: No lymphadenopathy CARDIAC: RRR , no murmurs, rubs, gallops RESPIRATORY:  Clear to auscultation without rales, wheezing or rhonchi  ABDOMEN: Soft, non-tender, non-distended MUSCULOSKELETAL:  No edema; No deformity  SKIN: Warm and dry NEUROLOGIC:  Alert and oriented x 3    EKG:   December 31, 2021: Normal sinus rhythm at 85.  No ST or T wave changes.  Recent Labs: 06/03/2021: ALT 13 08/31/2021: BUN 25; Creatinine, Ser 1.59; Potassium 4.1; Sodium 137    Lipid Panel    Component Value Date/Time   CHOL 121 10/08/2020 1508   TRIG 58 10/08/2020 1508   HDL 44 10/08/2020 1508   CHOLHDL 2.8 10/08/2020 1508   CHOLHDL 4 09/04/2020  1704   VLDL 15.6 09/04/2020 1704   LDLCALC 64 10/08/2020 1508      Wt Readings from Last 3 Encounters:  12/31/21 (!) 339 lb 12.8 oz (154.1 kg)  12/20/21 (!) 332 lb 0.2  oz (150.6 kg)  12/10/21 (!) 332 lb (150.6 kg)      Other studies Reviewed: Additional studies/ records that were reviewed today include: . Review of the above records demonstrates:    ASSESSMENT AND PLAN:  1.  Essential HTN-  .      BP is well controlled.   Con t meds Needs to work on weight loss     2. Obesity :  advices carb reduction    2. Leg edema -    3. Hyperlipidemia:  cont atorva.  Check labs today   4. Obstructive sleep apnea:  wears his CPAP      Current medicines are reviewed at length with the patient today.  The patient does not have concerns regarding medicines.  The following changes have been made:  We'll discontinue metoprolol and try carvedilol 25 mg twice a day.  Labs/ tests ordered today include:   Orders Placed This Encounter  Procedures   EKG 12-Lead    Disposition:       Kristeen Miss, MD  12/31/2021 4:30 PM    [facility] [address] [place], [place], [place]  [address] Phone: [phone]; Fax: [phone]

## 2021-12-31 ENCOUNTER — Encounter: Payer: Self-pay | Admitting: Cardiovascular Disease

## 2021-12-31 ENCOUNTER — Ambulatory Visit (INDEPENDENT_AMBULATORY_CARE_PROVIDER_SITE_OTHER): Payer: Managed Care, Other (non HMO) | Admitting: Cardiovascular Disease

## 2021-12-31 ENCOUNTER — Other Ambulatory Visit: Payer: Self-pay

## 2021-12-31 VITALS — BP 120/82 | HR 85 | Ht 74.0 in | Wt 339.8 lb

## 2021-12-31 DIAGNOSIS — G4733 Obstructive sleep apnea (adult) (pediatric): Secondary | ICD-10-CM

## 2021-12-31 DIAGNOSIS — E782 Mixed hyperlipidemia: Secondary | ICD-10-CM

## 2021-12-31 DIAGNOSIS — I10 Essential (primary) hypertension: Secondary | ICD-10-CM | POA: Diagnosis not present

## 2021-12-31 MED ORDER — LISINOPRIL 20 MG PO TABS: 20.0000 mg | ORAL_TABLET | Freq: Every day | ORAL | 3 refills | Status: DC

## 2021-12-31 MED ORDER — ATORVASTATIN CALCIUM 40 MG PO TABS: 40.0000 mg | ORAL_TABLET | Freq: Every day | ORAL | 3 refills | Status: DC

## 2021-12-31 MED ORDER — HYDROCHLOROTHIAZIDE 25 MG PO TABS: 25.0000 mg | ORAL_TABLET | Freq: Every day | ORAL | 3 refills | Status: DC

## 2021-12-31 MED ORDER — ATENOLOL 100 MG PO TABS: 100.0000 mg | ORAL_TABLET | Freq: Every day | ORAL | 3 refills | Status: DC

## 2021-12-31 MED ORDER — SPIRONOLACTONE 100 MG PO TABS: 100.0000 mg | ORAL_TABLET | Freq: Every day | ORAL | 3 refills | Status: DC

## 2021-12-31 NOTE — Patient Instructions (Addendum)
Medication Instructions:  Your physician recommends that you continue on your current medications as directed. Please refer to the Current Medication list given to you today.  *If you need a refill on your cardiac medications before your next appointment, please call your pharmacy*   Lab Work: TODAY:  LIPID, ALT, & BMET  If you have labs (blood work) drawn today and your tests are completely normal, you will receive your results only by: MyChart Message (if you have MyChart) OR A paper copy in the mail If you have any lab test that is abnormal or we need to change your treatment, we will call you to review the results.   Testing/Procedures: None ordered   Follow-Up: At [facility], you and your health needs are our priority.  As part of our continuing mission to provide you with exceptional heart care, we have created designated Provider Care Teams.  These Care Teams include your primary Cardiologist (physician) and Advanced Practice Providers (APPs -  Physician Assistants and Nurse Practitioners) who all work together to provide you with the care you need, when you need it.  We recommend signing up for the patient portal called "MyChart".  Sign up information is provided on this After Visit Summary.  MyChart is used to connect with patients for Virtual Visits (Telemedicine).  Patients are able to view lab/test results, encounter notes, upcoming appointments, etc.  Non-urgent messages can be sent to your provider as well.   To learn more about what you can do with MyChart, go to [email or web address].    Your next appointment:   12 month(s)  The format for your next appointment:   In Person  Provider:   Kristeen Miss, MD  or Chelsea Aus, PA-C or Tereso Newcomer, [place]         Other Instructions

## 2022-01-01 LAB — BASIC METABOLIC PANEL
BUN/Creatinine Ratio: 15 (ref 9–20)
BUN: 23 mg/dL (ref 6–24)
CO2: 20 mmol/L (ref 20–29)
Calcium: 9.3 mg/dL (ref 8.7–10.2)
Chloride: 102 mmol/L (ref 96–106)
Creatinine, Ser: 1.52 mg/dL — ABNORMAL HIGH (ref 0.76–1.27)
Glucose: 112 mg/dL — ABNORMAL HIGH (ref 70–99)
Potassium: 4.8 mmol/L (ref 3.5–5.2)
Sodium: 136 mmol/L (ref 134–144)
eGFR: 57 mL/min/{1.73_m2} — ABNORMAL LOW (ref >59)

## 2022-01-01 LAB — LIPID PANEL
Chol/HDL Ratio: 3.3 ratio (ref 0.0–5.0)
Cholesterol, Total: 120 mg/dL (ref 100–199)
HDL: 36 mg/dL — ABNORMAL LOW (ref >39)
LDL Chol Calc (NIH): 62 mg/dL (ref 0–99)
Triglycerides: 121 mg/dL (ref 0–149)
VLDL Cholesterol Cal: 22 mg/dL (ref 5–40)

## 2022-01-01 LAB — ALT: ALT: 15 IU/L (ref 0–44)

## 2022-01-05 NOTE — Progress Notes (Deleted)
NEUROLOGY FOLLOW UP OFFICE NOTE  SYMON NORWOOD [id number]  Assessment/Plan:   Migraine without aura, without status migrainosus, not intractable Transient episode of bilateral upper and lower extremity numbness and tingling - negative stroke workup- due to bilateral symptoms, not believed to be TIA.  Likely related to alcohol consumption and anxiety.    CTA head and neck Start topiramate 25mg  QHS for one week, then 50mg  at bedtime.  We can increase dose in 5 weeks if needed Will give him samples of Ubrelvy to try until CTA is done.  If CTA does not reveal an arterial etiology, will prescribe a triptan Limit use of pain relievers to no more than 2 days out of week to prevent risk of rebound or medication-overuse headache. Keep headache diary Advised to discontinue smoking cigars for now, in case it is a trigger. Follow up 6 months.   Subjective:  . Gahm is a 46 year old right-handed male with HTN and sleep apnea who follows up for TIA.   UPDATE: To evaluate new-onset headache, CTA of head and neck was performed on 08/12/2021, which was personally reviewed and normal.  Started topiramate.  Also had him try Ubrelvy, which was ineffective.  He tried Nurtec ***  Current NSAIDS/analgesics:  Excedrin Current triptans:  none Current ergotamine:  none Current anti-emetic:  none Current muscle relaxants:  none Current Antihypertensive medications:  atenolol 100mg  QD, lisinopril, HCTZ, spironolactone Current Antidepressant medications:  none Current Anticonvulsant medications:  none Current anti-CGRP:  none Current Vitamins/Herbal/Supplements:  none Current Antihistamines/Decongestants:  none Other therapy:  none Hormone/birth control:  none   HISTORY: I  TIA-LIKE EVENT, FELT TO BE ANXIETY: While on vacation in [place] on 03/30/2020, he developed transient right-sided upper and lower extremity numbness and tingling.  He was laying down and when he stood up, it did  not resolve.  There was no associated headache, facial droop, extremity weakness or slurred speech.  He went to [facility] where he developed left arm and leg numbness as well and he was admitted for stroke workup. Symptoms lasted a couple of hours.  CT head which was motion-limited, showed no acute intracranial abnormality.  MRI of brain also showed no acute intracranial abnormality.  Carotid doppler revealed no hemodynamically significant stenosis.  Hgb A1c was 6.3.  LDL was 104.  He was started on ASA 81mg  and Plavix 75mg  daily and Lipitor 40mg  daily.  In hindsight, he questions if he actually had a TIA.  He said he had been drinking brown liquor and was a little intoxicated and when he developed the symptoms, he started having anxiety.  07/16/2020 CTA HEAD:  Normal.   07/28/2020 2D ECHO W BUBBLE STUDY:  LVEF 55-60%, negative bubble study.  09/04/2021 HGB A1c 6.6.  10/08/2021 LDL 64  II NEW-ONSET HEADACHE: In June 2022, he developed severe pounding/throbbing pain from the back of his neck and head radiating over the entire head and down the face/jaw line either side.  One time he had nausea but otherwise no nausea, vomiting, photophobia, phonophobia, osmophobia, visual disturbance, numbness or weakness or autonomic symptoms.  It would often last an hour and resolve with Excedrin but sometimes will last longer and has woken him up from sleep.  Went to the ED on 06/02/2021 where he received a Toradol injection. Headache persisted.  Followed up with his PCP the next day and was given a prednisone taper which helped but not resolved.  They occur every other day.  The only change noted is that he started smoking cigars in April.  However, it has never occurred while smoking a cigar.   He never had headaches like these before.  He does have history of different headaches which he has attributed to uncontrolled blood pressure in the past.    III BENIGN PAROXYSMAL POSITIONAL VERTIGO: He  reports history of vertigo since around 2008-2009 that has been getting worse.  If he looks up or down, he exhibits spinning sensation with nausea and diaphoresis.  It lasts a few seconds.  He doesn't really recall how frequent it occurs.  Triggers including riding carnival rides (sky wheel) or when he switches from contact lenses to glasses.  PAST MEDICAL HISTORY: Past Medical History:  Diagnosis Date   Chicken pox    GERD (gastroesophageal reflux disease)    Gout 2022   Hypertension    Morbid obesity (HCC)    Sleep apnea     MEDICATIONS: Current Outpatient Medications on File Prior to Visit  Medication Sig Dispense Refill   atenolol (TENORMIN) 100 MG tablet Take 1 tablet (100 mg total) by mouth daily. 90 tablet 3   atorvastatin (LIPITOR) 40 MG tablet Take 1 tablet (40 mg total) by mouth daily. 90 tablet 3   hydrochlorothiazide (HYDRODIURIL) 25 MG tablet Take 1 tablet (25 mg total) by mouth daily. 90 tablet 3   HYDROcodone bit-homatropine (HYCODAN) 5-1.5 MG/5ML syrup Take 5 mLs by mouth every 8 (eight) hours as needed for cough. (Patient not taking: Reported on 12/31/2021) 120 mL 0   lisinopril (ZESTRIL) 20 MG tablet Take 1 tablet (20 mg total) by mouth daily. 90 tablet 3   pantoprazole (PROTONIX) 20 MG tablet Take 1 tablet (20 mg total) by mouth daily for 14 days. While taking diclofenac and prednisone (Patient not taking: Reported on 12/31/2021) 14 tablet 0   predniSONE (DELTASONE) 20 MG tablet 3 tabs po daily x 3 days, then 2 tabs x 3 days, then 1.5 tabs x 3 days, then 1 tab x 3 days, then 0.5 tabs x 3 days (Patient not taking: Reported on 12/31/2021) 27 tablet 0   spironolactone (ALDACTONE) 100 MG tablet Take 1 tablet (100 mg total) by mouth daily. 90 tablet 3   No current facility-administered medications on file prior to visit.    ALLERGIES: Allergies  Allergen Reactions   Amlodipine Other (See Comments)    edema   Bystolic [Nebivolol Hcl] Other (See Comments)    Patient stated  medication gave him major headaches    FAMILY HISTORY: Family History  Problem Relation Age of Onset   Hypertension Father    Depression Father    Hypertension Mother    Kidney disease Maternal Grandmother        Dialysis   Hypertension Maternal Grandmother    Healthy Brother        x1   Healthy Sister        x3   Colon cancer Neg Hx    Esophageal cancer Neg Hx    Rectal cancer Neg Hx    Stomach cancer Neg Hx       Objective:  *** General: No acute distress.  Patient appears ***-groomed.   Head:  Normocephalic/atraumatic Eyes:  Fundi examined but not visualized Neck: supple, no paraspinal tenderness, full range of motion Heart:  Regular rate and rhythm Lungs:  Clear to auscultation bilaterally Back: No paraspinal tenderness Neurological Exam: alert and oriented to person, place, and time.  Speech fluent and not dysarthric, language  intact.  CN II-XII intact. Bulk and tone normal, muscle strength 5/5 throughout.  Sensation to light touch intact.  Deep tendon reflexes 2+ throughout, toes downgoing.  Finger to nose testing intact.  Gait normal, Romberg negative.   Shon MilletAdam Nyimah Shadduck, DO  CC: ***

## 2022-01-06 ENCOUNTER — Ambulatory Visit: Payer: Managed Care, Other (non HMO) | Admitting: Neurology

## 2022-01-07 ENCOUNTER — Ambulatory Visit: Payer: Managed Care, Other (non HMO) | Admitting: Neurology

## 2022-01-12 ENCOUNTER — Encounter: Payer: Managed Care, Other (non HMO) | Admitting: Gastroenterology

## 2022-03-01 ENCOUNTER — Ambulatory Visit (INDEPENDENT_AMBULATORY_CARE_PROVIDER_SITE_OTHER): Payer: Managed Care, Other (non HMO) | Admitting: Family Medicine

## 2022-03-01 ENCOUNTER — Other Ambulatory Visit: Payer: Self-pay

## 2022-03-01 VITALS — BP 138/80 | HR 82 | Ht 74.0 in | Wt 332.4 lb

## 2022-03-01 DIAGNOSIS — M1A0720 Idiopathic chronic gout, left ankle and foot, without tophus (tophi): Secondary | ICD-10-CM

## 2022-03-01 MED ORDER — ALLOPURINOL 300 MG PO TABS: 300.0000 mg | ORAL_TABLET | Freq: Every day | ORAL | 1 refills | Status: DC

## 2022-03-01 MED ORDER — DICLOFENAC SODIUM 75 MG PO TBEC: 75.0000 mg | DELAYED_RELEASE_TABLET | Freq: Two times a day (BID) | ORAL | 0 refills | Status: DC | PRN

## 2022-03-01 NOTE — Patient Instructions (Addendum)
Thank you for coming in today.  ? ?Please use Voltaren gel (Generic Diclofenac Gel) up to 4x daily for pain as needed.  This is available over-the-counter as both the name brand Voltaren gel and the generic diclofenac gel.  ? ?Take both the allopurinol and colchicine ? ?Recheck back in 1 month ?

## 2022-03-01 NOTE — Progress Notes (Signed)
? ?I, Philbert Riser, LAT, ATC acting as a scribe for [name], MD. ? ?Robert Cruz is a 46 y.o. male who presents to [facility] at [facility] today for L foot pain. 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 Pt was last seen by Dr. [name] on 09/06/21 and was advised to cont current gout medications and could use a CAM walker boot if needed. Of note, pt was seen at the [facility] ED on 12/20/21 c/o L ankle pain and reported he has not been taking his gout rx (allopurinol) and was given a IM toradol injection. Today, pt reports he will get a gout flare intermittently. Pt does not take the allopurinol regularly. Pt has an old rx of oral diclofenac that he has found to be very helpful. Today, pt's L foot is not bothering him.  ? ?Estimates that he has a gout flare that he has to take medicine for every month or 2. ? ?Dx testing: 08/31/21 Labs (uric acid) ?            08/31/21 L ankle XR ? ?Pertinent review of systems: No fevers or chills ? ?Relevant historical information: Gout ? ? ?Exam:  ?BP 138/80   Pulse 82   Ht 6\' 2"  (1.88 m)   Wt (!) 332 lb 6.4 oz (150.8 kg)   SpO2 97%   BMI 42.68 kg/m?  ?General: Well Developed, well nourished, and in no acute distress.  ? ?MSK: Normal foot and ankle motion. ? ? ? ?Lab and Radiology Results ?Lab Results  ?Component Value Date  ? LABURIC 9.7 (H) 08/31/2021  ? ?  Chemistry   ?   ?Component Value Date/Time  ? NA 136 12/31/2021 1636  ? K 4.8 12/31/2021 1636  ? CL 102 12/31/2021 1636  ? CO2 20 12/31/2021 1636  ? BUN 23 12/31/2021 1636  ? CREATININE 1.52 (H) 12/31/2021 1636  ? CREATININE 1.48 (H) 08/30/2016 1627  ?    ?Component Value Date/Time  ? CALCIUM 9.3 12/31/2021 1636  ? ALKPHOS 63 06/03/2021 1513  ? AST 19 06/03/2021 1513  ? ALT 15 12/31/2021 1636  ? BILITOT 0.4 06/03/2021 1513  ? BILITOT 0.3 10/08/2020 1508  ?  ? ? ? ? ? 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 ?Patient is not currently taking his  allopurinol.  We had a discussion about the reason for uric acid control and that every time he has had a gout attack the joint is being injured.  After discussion plan to start allopurinol 300 mg.  We will also use diclofenac for as needed control if he has a gout flare.  Recheck in 1 month.  At that time anticipate rechecking metabolic panel and uric acid level.  We could also add colchicine if needed. ? ? ?PDMP not reviewed this encounter. ?No orders of the defined types were placed in this encounter. ? ?Meds ordered this encounter  ?Medications  ? diclofenac (VOLTAREN) 75 MG EC tablet  ?  Sig: Take 1 tablet (75 mg total) by mouth 2 (two) times daily as needed.  ?  Dispense:  60 tablet  ?  Refill:  0  ? allopurinol (ZYLOPRIM) 300 MG tablet  ?  Sig: Take 1 tablet (300 mg total) by mouth daily.  ?  Dispense:  90 tablet  ?  Refill:  1  ? ? ? ?Discussed warning signs or symptoms. Please see discharge instructions. Patient expresses  understanding. ? ? ?The above documentation has been reviewed and is accurate and complete [name], M.D. ? ? ?

## 2022-03-29 ENCOUNTER — Encounter: Payer: Self-pay | Admitting: Family Medicine

## 2022-03-29 ENCOUNTER — Ambulatory Visit (INDEPENDENT_AMBULATORY_CARE_PROVIDER_SITE_OTHER): Payer: Managed Care, Other (non HMO) | Admitting: Family Medicine

## 2022-03-29 VITALS — BP 110/70 | HR 76 | Ht 74.0 in | Wt 333.6 lb

## 2022-03-29 DIAGNOSIS — R3915 Urgency of urination: Secondary | ICD-10-CM | POA: Diagnosis not present

## 2022-03-29 DIAGNOSIS — M1A0720 Idiopathic chronic gout, left ankle and foot, without tophus (tophi): Secondary | ICD-10-CM

## 2022-03-29 DIAGNOSIS — M1A3720 Chronic gout due to renal impairment, left ankle and foot, without tophus (tophi): Secondary | ICD-10-CM

## 2022-03-29 LAB — BASIC METABOLIC PANEL
BUN: 22 mg/dL (ref 6–23)
CO2: 24 mEq/L (ref 19–32)
Calcium: 9.1 mg/dL (ref 8.4–10.5)
Chloride: 102 mEq/L (ref 96–112)
Creatinine, Ser: 1.38 mg/dL (ref 0.40–1.50)
GFR: 61.45 mL/min (ref >60.00)
Glucose, Bld: 106 mg/dL — ABNORMAL HIGH (ref 70–99)
Potassium: 4 mEq/L (ref 3.5–5.1)
Sodium: 134 mEq/L — ABNORMAL LOW (ref 135–145)

## 2022-03-29 LAB — URIC ACID: Uric Acid, Serum: 6.2 mg/dL (ref 4.0–7.8)

## 2022-03-29 NOTE — Progress Notes (Signed)
? ?I, [name], LAT, ATC, am serving as scribe for Dr. [name]. ? ?Robert Cruz is a 46 y.o. male who presents to [facility] at [facility] today for f/u L foot pain due to gout. 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 Pt was last seen by Dr. [name] on 03/01/22 and was advised to re-start allopurinol 300mg  and diclofenac. Today, pt reports that he has been doing well.  He has been taking the allopurinol consistently and hasn't needed the diclofenac. ? ?When were talking about his labs I mentioned his chronic kidney disease.  He brought up urinary urgency.  This is a longstanding issue.  He describes urgency and frequency and some hesitancy. ? ?Dx testing: 08/31/21 Labs (uric acid) ?            08/31/21 L ankle XR ? ?Pertinent review of systems: No fevers or chills. ?Urinary urgency noted above. ? ?Relevant historical information: Gout and hypertension ? ? ?Exam:  ?BP 110/70 (BP Location: Left Arm, Patient Position: Sitting, Cuff Size: Large)   Pulse 76   Ht 6\' 2"  (1.88 m)   Wt (!) 333 lb 9.6 oz (151.3 kg)   SpO2 94%   BMI 42.83 kg/m?  ?General: Well Developed, well nourished, and in no acute distress.  ? ?MSK: Normal foot and ankle motion ? ? ? ?Lab and Radiology Results ?Results for orders placed or performed in visit on 03/29/22 (from the past 72 hour(s))  ?Basic metabolic panel     Status: Abnormal  ? Collection Time: 03/29/22  4:26 PM  ?Result Value Ref Range  ? Sodium 134 (L) 135 - 145 mEq/L  ? Potassium 4.0 3.5 - 5.1 mEq/L  ? Chloride 102 96 - 112 mEq/L  ? CO2 24 19 - 32 mEq/L  ? Glucose, Bld 106 (H) 70 - 99 mg/dL  ? BUN 22 6 - 23 mg/dL  ? Creatinine, Ser 1.38 0.40 - 1.50 mg/dL  ? GFR 61.45 >60.00 mL/min  ?  Comment: Calculated using the CKD-EPI Creatinine Equation (2021)  ? Calcium 9.1 8.4 - 10.5 mg/dL  ?Uric acid     Status: None  ? Collection Time: 03/29/22  4:26 PM  ?Result Value Ref Range  ? Uric Acid, Serum 6.2 4.0 - 7.8 mg/dL  ? ?No results  found. ? ? ? ? ?Assessment and Plan: ?46 y.o. male with gout.  Doing well clinically.  Labs were obtained after the visit and as noted above are doing quite well.  Kidney function remained stable and uric acid is at goal of just around 6.2.  We will continue allopurinol at current dose. ?Plan to check back in 6 months. ? ?[name] has trouble getting to the primary care provider.  Based on his urinary urgency will refer to urology.  Recommend follow-up with PCP.  Should be a primary care provider issue ? ? ?PDMP not reviewed this encounter. ?Orders Placed This Encounter  ?Procedures  ? Uric acid  ?  Standing Status:   Future  ?  Number of Occurrences:   1  ?  Standing Expiration Date:   03/30/2023  ? Basic metabolic panel  ?  Standing Status:   Future  ?  Number of Occurrences:   1  ?  Standing Expiration Date:   03/30/2023  ? Ambulatory referral to Urology  ?  Referral Priority:   Routine  ?  Referral Type:   Consultation  ?  Referral  Reason:   Specialty Services Required  ?  Requested Specialty:   Urology  ?  Number of Visits Requested:   1  ? ?No orders of the defined types were placed in this encounter. ? ? ? ?Discussed warning signs or symptoms. Please see discharge instructions. Patient expresses understanding. ? ? ?The above documentation has been reviewed and is accurate and complete [name], M.D. ? ? ?

## 2022-03-29 NOTE — Patient Instructions (Addendum)
Good to see you today. ? ?Please get labs today before you leave. ? ?Follow-up: 6 months ?

## 2022-03-30 DIAGNOSIS — M1A9XX0 Chronic gout, unspecified, without tophus (tophi): Secondary | ICD-10-CM | POA: Insufficient documentation

## 2022-03-30 NOTE — Progress Notes (Signed)
Labs look great.  Uric acid is very close to goal at 6.2.  Plan to continue current dose of allopurinol.  I will be happy to refill it in the future when needed.  Plan to recheck in 6 months.

## 2022-03-31 ENCOUNTER — Encounter: Payer: Self-pay | Admitting: Family Medicine

## 2022-04-05 ENCOUNTER — Ambulatory Visit (INDEPENDENT_AMBULATORY_CARE_PROVIDER_SITE_OTHER): Payer: Managed Care, Other (non HMO) | Admitting: Gastroenterology

## 2022-04-05 ENCOUNTER — Encounter: Payer: Self-pay | Admitting: Gastroenterology

## 2022-04-05 VITALS — BP 112/72 | HR 82 | Ht 74.0 in | Wt 336.0 lb

## 2022-04-05 DIAGNOSIS — K219 Gastro-esophageal reflux disease without esophagitis: Secondary | ICD-10-CM

## 2022-04-05 DIAGNOSIS — Z1211 Encounter for screening for malignant neoplasm of colon: Secondary | ICD-10-CM | POA: Diagnosis not present

## 2022-04-05 MED ORDER — OMEPRAZOLE-SODIUM BICARBONATE 40-1100 MG PO CAPS: 1.0000 | ORAL_CAPSULE | Freq: Every day | ORAL | 5 refills | Status: DC

## 2022-04-05 NOTE — Patient Instructions (Addendum)
If you are age 46 or older, your body mass index should be between 23-30. Your Body mass index is 43.14 kg/m?Marland Kitchen If this is out of the aforementioned range listed, please consider follow up with your Primary Care Provider. ? ?If you are age 43 or younger, your body mass index should be between 19-25. Your Body mass index is 43.14 kg/m?Marland Kitchen If this is out of the aformentioned range listed, please consider follow up with your Primary Care Provider.  ? ?________________________________________________________ ? ?The [facility] providers would like to encourage you to use [facility] to communicate with providers for non-urgent requests or questions.  Due to long hold times on the telephone, sending your provider a message by [facility] may be a faster and more efficient way to get a response.  Please allow 48 business hours for a response.  Please remember that this is for non-urgent requests.  ?_______________________________________________________ ? ?You have been scheduled for a EGD/Colonoscopy on Thursday, 06-09-22. Please follow written instructions given to you at your visit today.  ?We understand that you already have your Suprep for the procedure. ?If you use inhalers (even only as needed), please bring them with you on the day of your procedure. ? ?We have sent the following medications to your pharmacy for you to pick up at your convenience: ?Zegerid 40 mg-1100:Take once a day  ? ? ?Thank you for entrusting me with your care and for choosing [facility], ?Doug Sou, P.A. - C.  ? ? ? ?

## 2022-04-05 NOTE — Progress Notes (Signed)
? ? ? ?04/05/2022 ?RAZI HICKLE ?[id number] ?07/01/1976 ? ? ?HISTORY OF PRESENT ILLNESS: This is a 46 year old male who is a patient of Dr. Irving Burton.  I saw him back in January for complaints of diarrhea.  I thought it sounded infectious and we hd ordered some stool studies and scheduled for colonoscopy with Dr. Myrtie Neither.  His diarrhea resolved and he never required doing the stool studies.  He had to cancel his colonoscopy, however, due to some scheduling conflicts.  He is here today and would like to reschedule that, but he also wants to discuss his reflux.  He tells me that his reflux symptoms have been very severe.  He tells me that over the years he has tried several different PPIs including Prilosec, Dexilant, Prevacid, Protonix.  He said he has tried Pepcid and Tums.  The only thing that is helping him right now is Gaviscon and even that does not seem to be helping as much as it used to.  He has tried making some dietary modifications.  He has cut out spaghetti sauce, etc as he is trying to cut out some carbs and stuff as well to try to lose weight.  He says that as soon as he wakes up in the morning he is having reflux symptoms. ? ?EGD 05/2022: ? ?- Circumferential rings mucosa in the esophagus. Biopsied. ?- 2 cm hiatal hernia. ?- Normal stomach. ?- Normal examined duodenum. ? ?1. Surgical [P], distal esophagus ?- SQUAMOUS MUCOSA WITH NO SIGNIFICANT PATHOLOGIC CHANGES. ?- PAS STAIN NEGATIVE FOR FUNGUS. ?- NO EOSINOPHILIC ESOPHAGITIS (LESS THAN 5 PER HIGH POWER FIELD). ?- NO DYSPLASIA OR MALIGNANCY. ?2. Surgical [P], mid esophagus ?- SQUAMOUS MUCOSA WITH NO SIGNIFICANT PATHOLOGIC CHANGES. ?- PAS STAIN NEGATIVE FOR FUNGUS. ?- NO EOSINOPHILIC ESOPHAGITIS (LESS THAN 5 PER HIGH POWER FIELD). ?- NO DYSPLASIA OR MALIGNANCY. ? ? ?Past Medical History:  ?Diagnosis Date  ? Chicken pox   ? GERD (gastroesophageal reflux disease)   ? Gout 2022  ? Hypertension   ? Morbid obesity (HCC)   ? Sleep apnea   ? ?History reviewed. No  pertinent surgical history. ? reports that he has been smoking cigars. He has never used smokeless tobacco. He reports that he does not drink alcohol and does not use drugs. ?family history includes Depression in his father; Healthy in his brother and sister; Hypertension in his father, maternal grandmother, and mother; Kidney disease in his maternal grandmother. ?Allergies  ?Allergen Reactions  ? Amlodipine Other (See Comments)  ?  edema  ? Bystolic [Nebivolol Hcl] Other (See Comments)  ?  Patient stated medication gave him major headaches  ? ? ?  ?Outpatient Encounter Medications as of 04/05/2022  ?Medication Sig  ? allopurinol (ZYLOPRIM) 300 MG tablet Take 1 tablet (300 mg total) by mouth daily.  ? alum hydroxide-mag trisilicate (GAVISCON) 80-20 MG CHEW chewable tablet Chew 2 tablets by mouth 4 (four) times daily as needed for indigestion or heartburn.  ? atenolol (TENORMIN) 100 MG tablet Take 1 tablet (100 mg total) by mouth daily.  ? atorvastatin (LIPITOR) 40 MG tablet Take 1 tablet (40 mg total) by mouth daily.  ? hydrochlorothiazide (HYDRODIURIL) 25 MG tablet Take 1 tablet (25 mg total) by mouth daily.  ? lisinopril (ZESTRIL) 20 MG tablet Take 1 tablet (20 mg total) by mouth daily.  ? spironolactone (ALDACTONE) 100 MG tablet Take 1 tablet (100 mg total) by mouth daily.  ? [DISCONTINUED] diclofenac (VOLTAREN) 75 MG EC tablet Take 1 tablet (75 mg  total) by mouth 2 (two) times daily as needed.  ? [DISCONTINUED] pantoprazole (PROTONIX) 20 MG tablet Take 1 tablet (20 mg total) by mouth daily for 14 days. While taking diclofenac and prednisone (Patient not taking: Reported on 12/31/2021)  ? ?No facility-administered encounter medications on file as of 04/05/2022.  ? ? ? ?REVIEW OF SYSTEMS  : All other systems reviewed and negative except where noted in the History of Present Illness. ? ? ?PHYSICAL EXAM: ?BP 112/72   Pulse 82   Ht 6\' 2"  (1.88 m)   Wt (!) 336 lb (152.4 kg)   SpO2 97%   BMI 43.14 kg/m?  ?General: Well  developed AA male in no acute distress ?Head: Normocephalic and atraumatic ?Eyes:  Sclerae anicteric, conjunctiva pink. ?Ears: Normal auditory acuity ?Lungs: Clear throughout to auscultation; no W/R/R. ?Heart: Regular rate and rhythm; no M/R/G. ?Abdomen: Soft, non-distended.  BS present.  Non-tender. ?Rectal:  Will be done at the time of colonoscopy. ?Musculoskeletal: Symmetrical with no gross deformities  ?Skin: No lesions on visible extremities ?Extremities: No edema  ?Neurological: Alert oriented x 4, grossly non-focal ?Psychological:  Alert and cooperative. Normal mood and affect ? ?ASSESSMENT AND PLAN: ?*GERD: Severe symptoms.  Over the years he has tried almost all PPIs and Pepcid.  Says that Tums do not work.  The only thing that helps is Gaviscon and even that is not working recently.  He has tried to make some dietary changes and will continue to do so.  Last EGD was almost 3 years ago.  He had a 2 cm hiatal hernia at that point.  We are going to try Zegerid if we can get it approved.  Maybe the sodium bicarbonate component will help him.  We will plan for repeat EGD as well.  Question if he would be candidate for TIF? ?*CRC screening: Never had a colonoscopy in the past.  Was scheduled with Dr. earlier this year, but had to cancel.  We will reschedule.   ? ?**The risks, benefits, and alternatives to EGD and colonoscopy were discussed with the patient and he consents to proceed.  ? ?  ? ? ?CC:  Myrtie Neither,* ? ?  ?

## 2022-04-07 ENCOUNTER — Telehealth: Payer: Self-pay

## 2022-04-07 NOTE — Telephone Encounter (Signed)
Prior Authorization has been started for Robert Cruz ?

## 2022-04-11 NOTE — Progress Notes (Signed)
____________________________________________________________ ? ?Attending physician addendum: ? ?Thank you for sending this case to me. ?I have reviewed the entire note and agree with the plan. ? ?Based on reported symptoms and previous endoscopic findings and recommendation, he will likely need pH and impedance testing as well as upper GI series after the EGD to see if he is a candidate for TIF versus fundoplication. ? ?Amada Jupiter, MD ? ?____________________________________________________________ ? ?

## 2022-04-18 NOTE — Telephone Encounter (Signed)
Patient called to get an update on the PA for Ze[facility].  Please call and advise.  Thank you. ?

## 2022-04-19 MED ORDER — OMEPRAZOLE-SODIUM BICARBONATE 40-1100 MG PO CAPS: 1.0000 | ORAL_CAPSULE | Freq: Every day | ORAL | 0 refills | Status: DC

## 2022-04-19 NOTE — Telephone Encounter (Signed)
Your information has been submitted to [employer] and is being reviewed. You may close this dialog, return to your dashboard, and perform other tasks. You will receive an electronic determination in CoverMyMeds within 72-120 hours; you'll also receive a faxed copy. You can see the latest determination by locating this request on your dashboard or reopening this request. If [employer] has not responded in 120 hours, contact [employer] at [phone]. 

## 2022-04-19 NOTE — Telephone Encounter (Signed)
Patient would like script sent to the [facility] on [address], with [facility] ?

## 2022-04-19 NOTE — Telephone Encounter (Signed)
Left voicemail to return phone call to get an update. ?

## 2022-04-19 NOTE — Telephone Encounter (Signed)
After being on hold with [employer] for 45 min, spoke Customer Service who could not find prior authorization that was started on 04/07/2022. Placed back on hold to wait for someone with pharmacy prior authorization. A representative finally answered after an hour and 25 minutes. She has advised me that  the prior authorization is still in review with the pharmacist and that they are very backed up. ?

## 2022-04-19 NOTE — Telephone Encounter (Signed)
Patient was in the [facility] with his wife. He came down stairs after receiving my message. He has been advised that we are still waiting on the insurance as they have not reviewed the prior authorization. He has been advised that He can get the script at [facility] for $32. He has asked for a 30 day supply to be sent to [facility] on [address]. ?

## 2022-04-21 ENCOUNTER — Ambulatory Visit: Payer: Managed Care, Other (non HMO) | Admitting: Family Medicine

## 2022-04-28 NOTE — Progress Notes (Signed)
I, Robert Cruz, LAT, ATC, am serving as scribe for Dr. [name].  Robert Cruz is a 46 y.o. male who presents to [facility] at [facility] today for L shoulder pain.  He was last seen by Dr. Denyse Cruz on 03/29/22 for L foot pain due to gout.  Today, pt reports L shoulder pain x several months that has been getting progressively worse.  He locates his pain to his entire L shoulder.  Radiating pain: no L shoulder mechanical symptoms: no Aggravating factors: L shoulder aBd>L shoulder flexion; laying on L shoulder; pushing off of his L UE Treatments tried: Diclofenac  R Achille's: pain x approximately one week w/ no known  MOI.  Locates pain to his R Achille's and post calcaneus.   -Aggravating factors: climbing stairs Pertinent review of systems: No fevers or chills  Relevant historical information: History of gout Currently controlled   Exam:  BP 100/62 (BP Location: Right Arm, Patient Position: Sitting, Cuff Size: Large)   Pulse 75   Ht 6\' 2"  (1.88 m)   Wt (!) 332 lb 9.6 oz (150.9 kg)   SpO2 94%   BMI 42.70 kg/m  General: Well Developed, well nourished, and in no acute distress.   MSK: Left shoulder: Normal appearing Nontender. Range of motion abduction 120 degrees with pain.  Internal rotation lumbar spine external rotation full. Strength 4/5 abduction 5/5 external and internal rotation. Positive Hawkins and Neer's test. Negative Yergason's and speeds test.  Right ankle mild swelling posterior calcaneus otherwise normal. Tender palpation posterior calcaneus. No palpable defect Achilles tendon. Normal foot and ankle motion.    Lab and Radiology Results  Procedure: Real-time Ultrasound Guided Injection of left shoulder subacromial bursa Device: Philips Affiniti 50G Images permanently stored and available for review in PACS Ultrasound evaluation prior to injection reveals moderate subacromial bursitis.  No retracted rotator cuff tears are  present. Verbal informed consent obtained.  Discussed risks and benefits of procedure. Warned about infection, bleeding, hyperglycemia damage to structures among others. Patient expresses understanding and agreement Time-out conducted.   Noted no overlying erythema, induration, or other signs of local infection.   Skin prepped in a sterile fashion.   Local anesthesia: Topical Ethyl chloride.   With sterile technique and under real time ultrasound guidance: 40 mg of Kenalog and 2 mL of Marcaine injected into subacromial bursa. Fluid seen entering the bursa.   Completed without difficulty   Pain moderately resolved suggesting accurate placement of the medication.   Advised to call if fevers/chills, erythema, induration, drainage, or persistent bleeding.   Images permanently stored and available for review in the ultrasound unit.  Impression: Technically successful ultrasound guided injection.    Procedure: Real-time Ultrasound Guided Injection of right retrocalcaneal bursa Device: Philips Affiniti 50G Images permanently stored and available for review in PACS Ultrasound evaluation prior to injection reveals intact rotator cuff tendon with a moderate retrocalcaneal bursitis present. Verbal informed consent obtained.  Discussed risks and benefits of procedure. Warned about infection, bleeding, hyperglycemia damage to structures among others. Patient expresses understanding and agreement Time-out conducted.   Noted no overlying erythema, induration, or other signs of local infection.   Skin prepped in a sterile fashion.   Local anesthesia: Topical Ethyl chloride.   With sterile technique and under real time ultrasound guidance: 40 mg of Kenalog and 1 mL of lidocaine injected into retrocalcaneal bursa. Fluid seen entering the bursa.   Completed without difficulty   Pain immediately resolved suggesting accurate placement of  the medication.   Advised to call if fevers/chills, erythema,  induration, drainage, or persistent bleeding.   Images permanently stored and available for review in the ultrasound unit.  Impression: Technically successful ultrasound guided injection.   X-ray images left shoulder obtained today personally and independently interpreted Mild glenohumeral DJD.  Moderate AC DJD. Await formal radiology review   Lab Results  Component Value Date   LABURIC 6.2 03/29/2022     Chemistry      Component Value Date/Time   NA 134 (L) 03/29/2022 1626   NA 136 12/31/2021 1636   K 4.0 03/29/2022 1626   CL 102 03/29/2022 1626   CO2 24 03/29/2022 1626   BUN 22 03/29/2022 1626   BUN 23 12/31/2021 1636   CREATININE 1.38 03/29/2022 1626   CREATININE 1.48 (H) 08/30/2016 1627      Component Value Date/Time   CALCIUM 9.1 03/29/2022 1626   ALKPHOS 63 06/03/2021 1513   AST 19 06/03/2021 1513   ALT 15 12/31/2021 1636   BILITOT 0.4 06/03/2021 1513   BILITOT 0.3 10/08/2020 1508        Assessment and Plan: 46 y.o. male with left shoulder pain thought to be due to subacromial impingement and bursitis.  Plan for subacromial injection.  Home exercise program reviewed.  Right heel pain due to retrocalcaneal bursitis.  Again injection today.  Recommend CAM Walker boot temporarily.  I do not think these issues are directly related to gout thankfully.  Continue current Regimen.  Recheck in 1 month.   PDMP not reviewed this encounter. Orders Placed This Encounter  Procedures   [place] LIMITED JOINT SPACE STRUCTURES UP LEFT(NO LINKED CHARGES)    Order Specific Question:   Reason for Exam (SYMPTOM  OR DIAGNOSIS REQUIRED)    Answer:   L shoulder pain    Order Specific Question:   Preferred imaging location?    Answer:   [facility] Shoulder Left    Standing Status:   Future    Number of Occurrences:   1    Standing Expiration Date:   05/30/2022    Order Specific Question:   Reason for Exam (SYMPTOM  OR DIAGNOSIS REQUIRED)    Answer:    L Shoulder pain    Order Specific Question:   Preferred imaging location?    Answer:   [facility]   No orders of the defined types were placed in this encounter.    Discussed warning signs or symptoms. Please see discharge instructions. Patient expresses understanding.   The above documentation has been reviewed and is accurate and complete [name], M.D.

## 2022-04-29 ENCOUNTER — Ambulatory Visit (INDEPENDENT_AMBULATORY_CARE_PROVIDER_SITE_OTHER): Payer: Managed Care, Other (non HMO) | Admitting: Family Medicine

## 2022-04-29 ENCOUNTER — Ambulatory Visit: Payer: Self-pay

## 2022-04-29 ENCOUNTER — Ambulatory Visit (INDEPENDENT_AMBULATORY_CARE_PROVIDER_SITE_OTHER): Payer: Managed Care, Other (non HMO)

## 2022-04-29 ENCOUNTER — Encounter: Payer: Self-pay | Admitting: Family Medicine

## 2022-04-29 VITALS — BP 100/62 | HR 75 | Ht 74.0 in | Wt 332.6 lb

## 2022-04-29 DIAGNOSIS — M25512 Pain in left shoulder: Secondary | ICD-10-CM

## 2022-04-29 DIAGNOSIS — G8929 Other chronic pain: Secondary | ICD-10-CM

## 2022-04-29 DIAGNOSIS — M7661 Achilles tendinitis, right leg: Secondary | ICD-10-CM

## 2022-04-29 NOTE — Patient Instructions (Addendum)
Good to see you today.  You had a L shoulder and R heel injection.  Call or go to the ER if you develop a large red swollen joint with extreme pain or oozing puss.   If still having pain in your Achille's you can wear a walking boot but cannot drive w/ the boot on.  Please get an Xray today before you leave.  Follow-up: one month

## 2022-05-03 ENCOUNTER — Ambulatory Visit: Payer: Self-pay

## 2022-05-03 ENCOUNTER — Telehealth: Payer: Self-pay | Admitting: Family Medicine

## 2022-05-03 ENCOUNTER — Ambulatory Visit (INDEPENDENT_AMBULATORY_CARE_PROVIDER_SITE_OTHER): Payer: Managed Care, Other (non HMO) | Admitting: Family Medicine

## 2022-05-03 VITALS — BP 112/78 | HR 76 | Wt 335.8 lb

## 2022-05-03 DIAGNOSIS — M25512 Pain in left shoulder: Secondary | ICD-10-CM | POA: Diagnosis not present

## 2022-05-03 DIAGNOSIS — G8929 Other chronic pain: Secondary | ICD-10-CM | POA: Diagnosis not present

## 2022-05-03 NOTE — Progress Notes (Signed)
Left shoulder x-rays show some arthritis changes.

## 2022-05-03 NOTE — Telephone Encounter (Signed)
Pt had shoulder and heel injection Friday 5/26. Expected to feel better by today. The heel is much better but the shoulder has had no improvement per pt, still limited ROM and pt unable to work today.

## 2022-05-03 NOTE — Progress Notes (Signed)
I, Philbert Riser, LAT, ATC acting as a scribe for [name], MD.  ABDINASIR SPADAFORE is a 46 y.o. male who presents to [facility] at [facility] today for cont L shoulder pain. Pt was last seen by Dr. [name] on 04/29/22 and was given a L subacromial steroid injection and HEP was reviewed. Pt contacted the office this morning reporting no relief from prior shoulder steroid injection. Today, pt reports increased pain first thing in the morning in his L shoulder. Pt is unable to pinpoint an exact location of pain.  Dx imaging: 04/29/22 L shoulder XR  Pertinent review of systems: No fevers or chills  Relevant historical information: CKD.  Obesity.  Gout.   Exam:  BP 112/78   Pulse 76   Wt (!) 335 lb 12.8 oz (152.3 kg)   SpO2 97%   BMI 43.11 kg/m  General: Well Developed, well nourished, and in no acute distress.   MSK: Left shoulder: Normal. Decreased shoulder range of motion pain with abduction.    Lab and Radiology Results  Procedure: Real-time Ultrasound Guided Injection of left shoulder glenohumeral joint posterior approach Device: Philips Affiniti 50G Images permanently stored and available for review in PACS Verbal informed consent obtained.  Discussed risks and benefits of procedure. Warned about infection, bleeding, hyperglycemia damage to structures among others. Patient expresses understanding and agreement Time-out conducted.   Noted no overlying erythema, induration, or other signs of local infection.   Skin prepped in a sterile fashion.   Local anesthesia: Topical Ethyl chloride.   With sterile technique and under real time ultrasound guidance: 40 mg of Kenalog and 2 mL of Marcaine injected into the shoulder joint. Fluid seen entering the joint capsule.   Completed without difficulty   Pain moderately  resolved suggesting accurate placement of the medication.   Advised to call if fevers/chills, erythema, induration, drainage, or persistent bleeding.    Images permanently stored and available for review in the ultrasound unit.  Impression: Technically successful ultrasound guided injection.    EXAM: LEFT SHOULDER - 2+ VIEW   COMPARISON:  None Available.   FINDINGS: AC joint degenerative changes. No fracture or dislocation. Mild glenohumeral degenerative changes identified. Limited views of the left chest are normal. No other acute abnormalities are identified.   IMPRESSION: Degenerative changes as above.  No other abnormalities.     Electronically Signed   By: Gerome Sam III M.D.   On: 04/30/2022 13:46 I, [name], personally (independently) visualized and performed the interpretation of the images attached in this note.     Assessment and Plan: 46 y.o. male with left shoulder pain.  Etiology is somewhat unclear.  He did not have great results with the subacromial injection last week.  Plan for glenohumeral injection today which did not provide immediate benefit.  Hopefully he will get some benefit from the steroid component.  If not much better proceed MRI to further characterize source of pain.  Recheck back as needed.  Home exercise program reviewed previously.   PDMP not reviewed this encounter. Orders Placed This Encounter  Procedures   [place] LIMITED JOINT SPACE STRUCTURES UP LEFT(NO LINKED CHARGES)    Order Specific Question:   Reason for Exam (SYMPTOM  OR DIAGNOSIS REQUIRED)    Answer:   left shoulder pain    Order Specific Question:   Preferred imaging location?    Answer:   [facility]   No orders of the defined types were placed in this  encounter.    Discussed warning signs or symptoms. Please see discharge instructions. Patient expresses understanding.   The above documentation has been reviewed and is accurate and complete [name], M.D.

## 2022-05-03 NOTE — Patient Instructions (Addendum)
Thank you for coming in today.   You received a steroid injection in your left shoulder today. Seek immediate medical attention if the joint becomes red, extremely painful, or is oozing fluid.   Check back as needed

## 2022-05-03 NOTE — Telephone Encounter (Signed)
I missed putting pantoprazole on that list.... But the way the denial reads he has to fail 5 of the 6.

## 2022-05-03 NOTE — Telephone Encounter (Signed)
Spoke with pt about addtl injection, scheduled for today at 1:15.

## 2022-05-03 NOTE — Telephone Encounter (Signed)
We can try glenohumeral injection.  He can schedule for that ASAP.

## 2022-05-03 NOTE — Telephone Encounter (Signed)
Received fax from patient's insurance. They have denied Zegrid (generic). It states that he has to try and fail the following medications: dexlansoprazole, esomeprazole, lansoprazole, omeprazole, and rabeprazole. Please advise.

## 2022-05-10 NOTE — Telephone Encounter (Signed)
He got his first month with [facility], [employer] denied Zegrid because he needs to fail 5 out of 6 PPI's.

## 2022-05-13 ENCOUNTER — Other Ambulatory Visit: Payer: Self-pay | Admitting: Gastroenterology

## 2022-05-20 ENCOUNTER — Other Ambulatory Visit (HOSPITAL_COMMUNITY): Payer: Self-pay

## 2022-05-20 ENCOUNTER — Telehealth: Payer: Self-pay | Admitting: Pharmacy Technician

## 2022-05-20 NOTE — Telephone Encounter (Signed)
Patient Advocate Encounter  Received notification from [employer] that prior authorization for ZEGRID is required.   PA submitted on 6.16.23 Key [id number] Status is pending    Ricke Hey, CPhT Patient Advocate Phone: [phone]

## 2022-05-27 ENCOUNTER — Telehealth: Payer: Self-pay | Admitting: Family

## 2022-05-27 NOTE — Telephone Encounter (Signed)
Pt would like to transfer from current PCP to [facility]. He said our office is closer to his house.

## 2022-05-30 ENCOUNTER — Telehealth: Payer: Self-pay

## 2022-05-30 MED ORDER — DICLOFENAC SODIUM 75 MG PO TBEC: 75.0000 mg | DELAYED_RELEASE_TABLET | Freq: Two times a day (BID) | ORAL | 0 refills | Status: DC | PRN

## 2022-05-30 NOTE — Telephone Encounter (Signed)
Patient called trying to get in earlier because of the extreme pain he is having in his achillis tendon. Informed patient that he should go to an urgent care since we are full up until Thursday patient stated understanding.   Patient is wanting to know if it is okay to take the diclofenac for the pain and if so what is the appropriate dose or is there something else he could take or be given to get him to his visit Thursday.

## 2022-05-31 NOTE — Telephone Encounter (Signed)
FYI-see below- 

## 2022-06-02 ENCOUNTER — Encounter: Payer: Self-pay | Admitting: Family Medicine

## 2022-06-02 ENCOUNTER — Ambulatory Visit: Payer: Self-pay

## 2022-06-02 ENCOUNTER — Ambulatory Visit (INDEPENDENT_AMBULATORY_CARE_PROVIDER_SITE_OTHER): Payer: Managed Care, Other (non HMO) | Admitting: Family Medicine

## 2022-06-02 VITALS — BP 128/80 | HR 76 | Ht 74.0 in | Wt 332.8 lb

## 2022-06-02 DIAGNOSIS — M7661 Achilles tendinitis, right leg: Secondary | ICD-10-CM

## 2022-06-02 MED ORDER — NITROGLYCERIN 0.2 MG/HR TD PT24: MEDICATED_PATCH | TRANSDERMAL | 1 refills | Status: DC

## 2022-06-02 NOTE — Progress Notes (Signed)
   I, Robert Cruz, LAT, ATC, am serving as scribe for Dr. [name].  Robert Cruz is a 46 y.o. male who presents to [facility] at [facility] today for continued R Achille's pain. Pt was last seen by Dr. [name] on 05/03/22 for chronic L shoulder pain and had a L GHJ steroid injection. Pt had a R retrocalcaneal bursitis steroid injection on 04/29/22 and a L subacromial steroid and was advised to use a CAM walker boot. Today, pt reports that the injection in his R retrocalcaneal bursa helped for about 2 weeks and then the pain came back in mid-June.  He notes fairly constant pain that is worse when he is on his feet   He has been taking the oral diclofenac and Tylenol which is helping w/ his pain but he's getting ready to leave for vacation next week and is worried he won't be able to walk around as much as he would like.  Dx imaging: 08/31/21 L ankle XR  Pertinent review of systems: No fevers or chills  Relevant historical information: Obesity.  Gout.   Exam:  BP 128/80 (BP Location: Left Arm, Patient Position: Sitting, Cuff Size: Large)   Pulse 76   Ht 6\' 2"  (1.88 m)   Wt (!) 332 lb 12.8 oz (151 kg)   BMI 42.73 kg/m  General: Well Developed, well nourished, and in no acute distress.   MSK: Right posterior ankle normal-appearing Tender palpation at Achilles tendon about 3 cm proximal to calcaneus.  Normal foot and ankle motion. Intact strength.    Lab and Radiology Results  Diagnostic Limited MSK Ultrasound of: Right Achilles tendon Achilles tendon is increased diameter at area of tenderness about 3 cm proximal to insertion on the calcaneus.  This is consistent with tendinitis and a small Achilles tendon nodule.  There is still present a mild to moderate retrocalcaneal bursitis.  No visible Achilles tendon tears are present. Impression: Achilles tendon nodule and tendinitis right ankle.     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   Plan for nitroglycerin patch protocol and continued eccentric exercises.  Consider shockwave therapy if not improved.   PDMP not reviewed this encounter. Orders Placed This Encounter  Procedures   49 LIMITED JOINT SPACE STRUCTURES LOW RIGHT(NO LINKED CHARGES)    Order Specific Question:   Reason for Exam (SYMPTOM  OR DIAGNOSIS REQUIRED)    Answer:   R Achille's    Order Specific Question:   Preferred imaging location?    Answer:   [facility] nurse ordered this encounter  Medications   nitroGLYCERIN (NITRODUR - DOSED IN MG/24 HR) 0.2 mg/hr patch    Sig: Apply 1/4 patch daily to tendon for tendonitis.    Dispense:  30 patch    Refill:  1     Discussed warning signs or symptoms. Please see discharge instructions. Patient expresses understanding.   The above documentation has been reviewed and is accurate and complete [employer], M.D.

## 2022-06-02 NOTE — Patient Instructions (Addendum)
Good to see you today.  Do the exercises  Nitroglycerin Protocol  Apply 1/4 nitroglycerin patch to affected area daily. Change position of patch within the affected area every 24 hours. You may experience a headache during the first 1-2 weeks of using the patch, these should subside. If you experience headaches after beginning nitroglycerin patch treatment, you may take your preferred over the counter pain reliever. Another side effect of the nitroglycerin patch is skin irritation or rash related to patch adhesive. Please notify our office if you develop more severe headaches or rash, and stop the patch. Tendon healing with nitroglycerin patch may require 12 to 24 weeks depending on the extent of injury. Men should not use if taking Viagra, Cialis, or Levitra.  Do not use if you have migraines or rosacea.    Follow-up: 1 month.

## 2022-06-03 DIAGNOSIS — M7661 Achilles tendinitis, right leg: Secondary | ICD-10-CM | POA: Insufficient documentation

## 2022-06-06 ENCOUNTER — Other Ambulatory Visit (HOSPITAL_COMMUNITY): Payer: Self-pay

## 2022-06-06 NOTE — Telephone Encounter (Signed)
Patient Advocate Encounter  Prior Authorization for OMEPRAZOLE-SODIUM BICARBONATE 40-1100MG  has been approved.    PA# PA Case ID: [id number] Effective dates: 6.27.23 through 7.3.24  Zanaya Baize B. CPhT P: [phone] F: [phone]

## 2022-06-09 ENCOUNTER — Other Ambulatory Visit (HOSPITAL_COMMUNITY): Payer: Self-pay

## 2022-06-09 ENCOUNTER — Encounter: Payer: Managed Care, Other (non HMO) | Admitting: Gastroenterology

## 2022-06-12 ENCOUNTER — Other Ambulatory Visit: Payer: Self-pay | Admitting: Gastroenterology

## 2022-06-30 ENCOUNTER — Ambulatory Visit: Payer: Self-pay

## 2022-06-30 ENCOUNTER — Ambulatory Visit: Payer: Managed Care, Other (non HMO) | Admitting: Family Medicine

## 2022-06-30 ENCOUNTER — Encounter: Payer: Self-pay | Admitting: Family Medicine

## 2022-06-30 VITALS — BP 100/62 | HR 74 | Ht 74.0 in | Wt 336.6 lb

## 2022-06-30 DIAGNOSIS — M7661 Achilles tendinitis, right leg: Secondary | ICD-10-CM

## 2022-06-30 DIAGNOSIS — M25572 Pain in left ankle and joints of left foot: Secondary | ICD-10-CM

## 2022-06-30 DIAGNOSIS — G8929 Other chronic pain: Secondary | ICD-10-CM

## 2022-06-30 DIAGNOSIS — M25512 Pain in left shoulder: Secondary | ICD-10-CM

## 2022-06-30 NOTE — Progress Notes (Signed)
I, Robert Cruz, LAT, ATC, am serving as scribe for Dr. [name].  Robert Cruz is a 46 y.o. male who presents to [facility] at [facility] today for continued L shoulder pain. Pt was last seen by Dr. [name] on 06/02/22 for R Achilles tendinopathy. Pt was last seen for his L shoulder on 05/03/22 and was given a L GH steroid injection and advised to work on HEP. Pt had a L subacromial steroid injection on 04/29/22. Today, pt reports that his L shoulder has been bothering him again for about one week.  He also reports that he con't to have some R Achille's pain. He has been using his nitro patches and has been using a half patch but feels like he's having more issue w/ HA than he's getting relief from the patches.  Additionally he notes left ankle pain and swelling.  This started without injury recently.   Dx imaging: 04/29/22 L shoulder XR  Pertinent review of systems: No fevers or chills  Relevant historical information: Hypertension and gout.   Exam:  BP 100/62 (BP Location: Right Arm, Patient Position: Sitting, Cuff Size: Large)   Pulse 74   Ht 6\' 2"  (1.88 m)   Wt (!) 336 lb 9.6 oz (152.7 kg)   SpO2 95%   BMI 43.22 kg/m  General: Well Developed, well nourished, and in no acute distress.   MSK:  Left shoulder: Normal appearing Nontender. Decreased range of motion pain with abduction.  Positive Hawkins and Neer's test.  Left ankle: Moderate effusion.  Tender palpation ATFL region. Decreased ankle range of motion. Intact strength.  Right Achilles tendon: Tender palpation at Achilles tendon.  Palpable nodule posterior Achilles tendon region.  Decreased ankle range of motion dorsiflexion plantarflexion pain with resisted plantarflexion and passive dorsiflexion.    Lab and Radiology Results  Procedure: Real-time Ultrasound Guided Injection of left ankle lateral approach Device: Philips Affiniti 50G Images permanently stored and available for review in  PACS Verbal informed consent obtained.  Discussed risks and benefits of procedure. Warned about infection, bleeding, hyperglycemia damage to structures among others. Patient expresses understanding and agreement Time-out conducted.   Noted no overlying erythema, induration, or other signs of local infection.   Skin prepped in a sterile fashion.   Local anesthesia: Topical Ethyl chloride.   With sterile technique and under real time ultrasound guidance: 40 mg of Kenalog and 2 mL of Marcaine injected into ankle joint. Fluid seen entering the joint capsule.   Completed without difficulty   Pain immediately resolved suggesting accurate placement of the medication.   Advised to call if fevers/chills, erythema, induration, drainage, or persistent bleeding.   Images permanently stored and available for review in the ultrasound unit.  Impression: Technically successful ultrasound guided injection.                   Extracorporeal Shockwave Therapy Note    Patient is being treated today with ECSWT. Informed consent was obtained and patient tolerated procedure well.   Therapy performed by  Condition treated: Right Achilles tendon Treatment preset used: Heel spur Energy used: 90 mJ Frequency used: 10 Hz Number of pulses: 2000 Treatment #1 of #4     Assessment and Plan: 46 y.o. male with pain in multiple locations.  Left shoulder.  This is an acute exacerbation or recurrence of a chronic shoulder pain.  He has had several trials of treatment for shoulder pain ongoing since at least Apr 29, 2022  including subacromial and intra-articular glenohumeral injection as well as home exercise program.  This worked initially but the pain has returned.  At this point plan for MRI to better characterize source of pain and for potential repeat steroid injection planning or surgery planning.  Recheck after MRI or proceed to surgery referral.  Left ankle pain: Unclear etiology.  Likely gout  exacerbation.  Plan for steroid injection.  Continue allopurinol.  Right Achilles tendon pain patient has had difficult to control right Achilles tendinitis.  Initially did reasonably well with eccentric exercises and nitroglycerin patch protocol but pain is continuing and worsening.  We will proceed to shockwave therapy today.  This is a self-pay procedure.  We will proceed with initial trial of 4 treatments.  1/4 today.  Return 1 week for 2/4.   PDMP not reviewed this encounter. Orders Placed This Encounter  Procedures   MR SHOULDER LEFT WO CONTRAST    Standing Status:   Future    Standing Expiration Date:   07/01/2023    Order Specific Question:   What is the patient's sedation requirement?    Answer:   No Sedation    Order Specific Question:   Does the patient have a pacemaker or implanted devices?    Answer:   No    Order Specific Question:   Preferred imaging location?    Answer:   [facility] (table limit-350lbs)   [place] LIMITED JOINT SPACE STRUCTURES UP LEFT(NO LINKED CHARGES)    Order Specific Question:   Reason for Exam (SYMPTOM  OR DIAGNOSIS REQUIRED)    Answer:   L ankle pain    Order Specific Question:   Preferred imaging location?    Answer:   [facility]   No orders of the defined types were placed in this encounter.    Discussed warning signs or symptoms. Please see discharge instructions. Patient expresses understanding.   The above documentation has been reviewed and is accurate and complete [name], M.D.

## 2022-06-30 NOTE — Patient Instructions (Addendum)
Good to see you today.  You had a left ankle injection.  Call or go to the ER if you develop a large red swollen joint with extreme pain or oozing puss.   I've ordered an MRI for your left shoulder.  That facility will call you to schedule but please let us know if you don't hear from them in one week regarding scheduling.  Follow-up: next week for shockwave for your right Achille's (OK to double book the 4:00 slot)

## 2022-07-07 ENCOUNTER — Ambulatory Visit (INDEPENDENT_AMBULATORY_CARE_PROVIDER_SITE_OTHER): Payer: Self-pay | Admitting: Family Medicine

## 2022-07-07 DIAGNOSIS — M7661 Achilles tendinitis, right leg: Secondary | ICD-10-CM

## 2022-07-07 MED ORDER — NITROGLYCERIN 0.2 MG/HR TD PT24: MEDICATED_PATCH | TRANSDERMAL | 1 refills | Status: DC

## 2022-07-07 NOTE — Patient Instructions (Signed)
Thank you for coming in today.   Schedule your 3rd shockwave therapy for next week.

## 2022-07-08 NOTE — Progress Notes (Signed)
   [facility] [address] [place] [address] Phone: [phone]   Extracorporeal Shockwave Therapy Note    Patient is being treated today with ECSWT. Informed consent was obtained and patient tolerated procedure well.   Therapy performed by [name]  Condition treated: Achilles tendinitis Treatment preset used: Default Energy used: 90 mJ Frequency used: 10 Hz Number of pulses: 2000 Treatment #2 of #4  Electronically signed by:  [facility] 6:38 AM 07/08/22

## 2022-07-14 ENCOUNTER — Ambulatory Visit (INDEPENDENT_AMBULATORY_CARE_PROVIDER_SITE_OTHER): Payer: Self-pay | Admitting: Family Medicine

## 2022-07-14 DIAGNOSIS — M7661 Achilles tendinitis, right leg: Secondary | ICD-10-CM

## 2022-07-14 NOTE — Progress Notes (Signed)
   [facility] [address] [place] [address] Phone: [phone]   Extracorporeal Shockwave Therapy Note    Patient is being treated today with ECSWT. Informed consent was obtained and patient tolerated procedure well.   Therapy performed by [name]  Condition treated: Achilles tendinitis Treatment preset used: Default Energy used: 90 mJ Frequency used: 10 Hz Number of pulses: 2000 Treatment #3 of #4  Electronically signed by:  [facility] 4:03 PM 07/14/22  Darean is not feeling much better at all.  I am not optimistic that this treatment is working very well.  We may consider discontinuing this and moving directly to MRI.  He will think about it over the week.

## 2022-07-17 ENCOUNTER — Ambulatory Visit (INDEPENDENT_AMBULATORY_CARE_PROVIDER_SITE_OTHER): Payer: Managed Care, Other (non HMO)

## 2022-07-17 DIAGNOSIS — G8929 Other chronic pain: Secondary | ICD-10-CM | POA: Diagnosis not present

## 2022-07-17 DIAGNOSIS — M25512 Pain in left shoulder: Secondary | ICD-10-CM | POA: Diagnosis not present

## 2022-07-19 NOTE — Progress Notes (Signed)
Left shoulder MRI shows rotator cuff tendinitis with a tiny rotator cuff tear and a little bit of arthritis.  We will talk about this further during her follow-up visit on the 17th.

## 2022-07-21 ENCOUNTER — Ambulatory Visit (INDEPENDENT_AMBULATORY_CARE_PROVIDER_SITE_OTHER): Payer: Managed Care, Other (non HMO) | Admitting: Family Medicine

## 2022-07-21 VITALS — BP 132/84 | HR 78 | Ht 74.0 in | Wt 339.2 lb

## 2022-07-21 DIAGNOSIS — M7661 Achilles tendinitis, right leg: Secondary | ICD-10-CM | POA: Diagnosis not present

## 2022-07-21 DIAGNOSIS — M25512 Pain in left shoulder: Secondary | ICD-10-CM | POA: Diagnosis not present

## 2022-07-21 DIAGNOSIS — G8929 Other chronic pain: Secondary | ICD-10-CM

## 2022-07-21 NOTE — Progress Notes (Signed)
I, Philbert Riser, LAT, ATC acting as a scribe for [name], MD.  Robert Cruz is a 46 y.o. male who presents to [facility] at [facility] today for f/u L shoulder pain w/ MRI review and R Achilles tendonopathy. Pt was last seen by Dr. [name] on 07/14/22 for his 2nd shockwave therapy treatment. Today, pt reports L shoulder pain is about the same. Pt c/o horrible pain in his R Achilles.   The right Achilles pain has been ongoing for months and is failing typical conservative management including home exercise program directed by me including eccentric exercises daily for at least 20 minutes, nitroglycerin patch protocol, and even shockwave therapy.  We also have tried a retrocalcaneal bursa injection.  None of this worked very well.  Dx imaging: 07/17/22 L shoulder MRI 04/29/22 L shoulder XR  Pertinent review of systems: No fevers or chills  Relevant historical information: Controlled gout   Exam:  BP 132/84   Pulse 78   Ht 6\' 2"  (1.88 m)   Wt (!) 339 lb 3.2 oz (153.9 kg)   SpO2 97%   BMI 43.55 kg/m  General: Well Developed, well nourished, and in no acute distress.   MSK: Left shoulder: Normal-appearing Normal motion pain with abduction.  Intact strength.  Positive Hawkins and Neer's test.  Right Achilles: Tender palpation posterior calcaneus.  Decreased ankle range of motion pain with dorsiflexion and resisted plantarflexion.  Antalgic gait is present.    Xray results: EXAM: MRI OF THE LEFT SHOULDER WITHOUT CONTRAST   TECHNIQUE: Multiplanar, multisequence MR imaging of the shoulder was performed. No intravenous contrast was administered.   COMPARISON:  Left shoulder x-rays dated Apr 29, 2022.   FINDINGS: Rotator cuff: Mild distal supraspinatus tendon with small focal full-thickness tear anteriorly at the insertion. Mild distal infraspinatus tendinosis. The teres minor and subscapularis tendons are unremarkable.   Muscles: No atrophy or abnormal  signal of the muscles of the rotator cuff.   Biceps long head:  Intact and normally positioned.   Acromioclavicular Joint: Mild arthropathy of the acromioclavicular joint. Trace subacromial/subdeltoid bursal fluid.   Glenohumeral Joint: No significant joint effusion. Mild diffuse cartilage thinning. Small inferior glenoid marginal osteophyte.   Labrum: Grossly intact, but evaluation is limited by lack of intraarticular fluid.   Bones:  No marrow abnormality, fracture or dislocation.   Other: None.   IMPRESSION: 1. Mild distal supraspinatus tendinosis with small focal full-thickness tear anteriorly at the insertion. 2. Mild distal infraspinatus tendinosis. 3. Mild acromioclavicular and glenohumeral osteoarthritis.     Electronically Signed   By: May 01, 2022 M.D.   On: 07/18/2022 17:23     I, 07/20/2022, personally (independently) visualized and performed the interpretation of the images attached in this note.   Assessment and Plan: 46 y.o. male with  Chronic left shoulder pain.  Pain due to rotator cuff tendinopathy with small partial rotator cuff tear.  Patient is failing conservative management for this.  We have tried multiple injections and home exercise program.  He is never completed a formal physical therapy course for this.  Typically at this point would be doing surgical consultation with potential physical therapy.  However his ankle has become so dominant relatively recently that it is moved to the forefront and is currently going to proceed to MRI evaluation.  We will evaluate the right Achilles tendinitis with MRI and may even be proceeding to surgery planning for the ankle.  If he is going to have a  surgery for his right ankle I do not think we can really focus on the shoulder at this time.  Certainly we could do physical therapy or even a shoulder surgery but he cannot have an ankle and her shoulder surgery around the same time.  The shoulder will take a  backseat to the ankle.   PDMP not reviewed this encounter. Orders Placed This Encounter  Procedures   MR ANKLE RIGHT WO CONTRAST    Standing Status:   Future    Standing Expiration Date:   07/22/2023    Order Specific Question:   What is the patient's sedation requirement?    Answer:   No Sedation    Order Specific Question:   Does the patient have a pacemaker or implanted devices?    Answer:   No    Order Specific Question:   Preferred imaging location?    Answer:   [facility] (table limit-350lbs)   No orders of the defined types were placed in this encounter.    Discussed warning signs or symptoms. Please see discharge instructions. Patient expresses understanding.   The above documentation has been reviewed and is accurate and complete [name], M.D.

## 2022-07-21 NOTE — Patient Instructions (Signed)
Thank you for coming in today.   Please use Voltaren gel (Generic Diclofenac Gel) up to 4x daily for pain as needed.  This is available over-the-counter as both the name brand Voltaren gel and the generic diclofenac gel.   You should hear from MRI scheduling within 1 week. If you do not hear please let me know.    Check back after MRI

## 2022-07-30 ENCOUNTER — Ambulatory Visit (INDEPENDENT_AMBULATORY_CARE_PROVIDER_SITE_OTHER): Payer: Managed Care, Other (non HMO)

## 2022-07-30 DIAGNOSIS — M7661 Achilles tendinitis, right leg: Secondary | ICD-10-CM | POA: Diagnosis not present

## 2022-08-01 ENCOUNTER — Ambulatory Visit (INDEPENDENT_AMBULATORY_CARE_PROVIDER_SITE_OTHER): Payer: Managed Care, Other (non HMO) | Admitting: Family Medicine

## 2022-08-01 ENCOUNTER — Ambulatory Visit: Payer: Self-pay

## 2022-08-01 VITALS — BP 120/76 | HR 76 | Ht 74.0 in | Wt 332.6 lb

## 2022-08-01 DIAGNOSIS — M7661 Achilles tendinitis, right leg: Secondary | ICD-10-CM | POA: Diagnosis not present

## 2022-08-01 DIAGNOSIS — M25572 Pain in left ankle and joints of left foot: Secondary | ICD-10-CM | POA: Diagnosis not present

## 2022-08-01 DIAGNOSIS — G8929 Other chronic pain: Secondary | ICD-10-CM

## 2022-08-01 DIAGNOSIS — M25571 Pain in right ankle and joints of right foot: Secondary | ICD-10-CM

## 2022-08-01 DIAGNOSIS — M25512 Pain in left shoulder: Secondary | ICD-10-CM

## 2022-08-01 NOTE — Progress Notes (Unsigned)
I, Philbert Riser, LAT, ATC acting as a scribe for [name], MD.  Robert Cruz is a 46 y.o. male who presents to [facility] at [facility] today for f/u R Achilles tendonopathy w/ MRI review. Pt was last seen by Dr. [name] on 07/21/22 at which point he was failing typical conservative management including; HEP, nitroglycerin patch protocol, shockwave therapy, and retrocalcaneal bursa injection. Pt was advised to proceed to R ankle MRI. Pt also here to f/u on L shoulder pain. Today, pt reports R heel/ankle is the same. Pt worsening L shoulder pain for no particular reason.  Dx imaging: 07/30/22 R ankle MRI   07/17/22 L shoulder MRI  04/29/22 L shoulder XR  Pertinent review of systems: No fevers or chills  Relevant historical information: Hypertension   Exam:  BP 120/76   Pulse 76   Ht 6\' 2"  (1.88 m)   Wt (!) 332 lb 9.6 oz (150.9 kg)   SpO2 96%   BMI 42.70 kg/m  General: Well Developed, well nourished, and in no acute distress.   MSK: Left shoulder: Normal-appearing Tender palpation. Pain with abduction.  Right ankle: Tender palpation posterior ankle and Achilles tendon.    Lab and Radiology Results No results found for this or any previous visit (from the past 72 hour(s)). MR ANKLE RIGHT WO CONTRAST  Result Date: 08/01/2022 CLINICAL DATA:  Ankle pain for 1 year.  Achilles tendonitis. EXAM: MRI OF THE RIGHT ANKLE WITHOUT CONTRAST TECHNIQUE: Multiplanar, multisequence MR imaging of the ankle was performed. No intravenous contrast was administered. COMPARISON:  Radiographs dated Apr 13, 2017 FINDINGS: TENDONS Peroneal: Peroneal longus tendon intact. Peroneal brevis intact. Posteromedial: Posterior tibial tendon intact. Flexor hallucis longus tendon intact. Flexor digitorum longus tendon intact. Anterior: Tibialis anterior tendon intact. Extensor hallucis longus tendon intact Extensor digitorum longus tendon intact. Achilles: Thickening of the Achilles tendon with  intermediate signal about its insertion. Plantar Fascia: Intact. LIGAMENTS Lateral: Anterior talofibular ligament intact. Calcaneofibular ligament intact. Posterior talofibular ligament intact. Anterior and posterior tibiofibular ligaments intact. Medial: Deltoid ligament intact. Spring ligament intact. CARTILAGE Ankle Joint: Small joint effusion. Normal ankle mortise. No chondral defect. Subtalar Joints/Sinus Tarsi: Mild edema of the sinus tarsi. Bones: Focal calcaneus marrow edema about the insertion of the Achilles tendon. No fracture or dislocation. Soft Tissue: Mild subcutaneous soft tissue edema without fluid collection or hematoma. Muscles are normal without edema or atrophy. Tarsal tunnel is normal. IMPRESSION: 1. Thickening of the Achilles tendon with intermediate signal as well as mild calcaneal edema about the insertion of the Achilles tendon concerning for Achilles tendinosis with peritenonitis. No evidence of tendon tear. 2. Small tibiotalar and subtalar joint effusion. No evidence of osteochondral injury significant arthropathy. 3. Ligaments of the medial and lateral ankle are intact. 4. Tendons of the flexor, extensor and peroneal compartments are maintained. Electronically Signed   By: Apr 15, 2017 D.O.   On: 08/01/2022 09:36   I, 08/03/2022, personally (independently) visualized and performed the interpretation of the images attached in this note.   Procedure: Real-time Ultrasound Guided Injection of left shoulder subacromial bursa Device: Philips Affiniti 50G Images permanently stored and available for review in PACS Verbal informed consent obtained.  Discussed risks and benefits of procedure. Warned about infection, bleeding, hyperglycemia damage to structures among others. Patient expresses understanding and agreement Time-out conducted.   Noted no overlying erythema, induration, or other signs of local infection.   Skin prepped in a sterile fashion.   Local anesthesia: Topical Ethyl  chloride.   With sterile technique and under real time ultrasound guidance: 40 mg of Kenalog and 2 mL of Marcaine injected into subacromial bursa. Fluid seen entering the bursa.   Completed without difficulty   Pain immediately resolved suggesting accurate placement of the medication.   Advised to call if fevers/chills, erythema, induration, drainage, or persistent bleeding.   Images permanently stored and available for review in the ultrasound unit.  Impression: Technically successful ultrasound guided injection.      Assessment and Plan: 46 y.o. male with right ankle pain and Achilles tendinitis.  Unfortunately this is failing conservative management including home exercise program, nitroglycerin patch protocol, and even shockwave therapy.  At this point there are a few other conservative management options including PRP but I am less optimistic.  I think it is worthwhile for him to have a consultation with orthopedic surgery to discuss surgical options.  He is becoming disabled by his chronic bothersome right Achilles tendinitis pain.  We will place referral now.  Happy to explore other options in the future if that is determined to be his best option after consultation with orthopedic surgery.  He additionally has left shoulder pain due to rotator cuff tendinopathy and small rotator cuff tear.  This also could have been treated surgically although injections are working reasonably well.  We will continue injections until later stop working or he is in a more stable situation to have surgery.   PDMP not reviewed this encounter. Orders Placed This Encounter  Procedures   [place] LIMITED JOINT SPACE STRUCTURES UP LEFT(NO LINKED CHARGES)    Order Specific Question:   Reason for Exam (SYMPTOM  OR DIAGNOSIS REQUIRED)    Answer:   left shoulder pain    Order Specific Question:   Preferred imaging location?    Answer:   [facility] referral to Orthopedic Surgery     Referral Priority:   Routine    Referral Type:   Surgical    Referral Reason:   Specialty Services Required    Referred to Provider:   Nadara Mustard, MD    Requested Specialty:   Orthopedic Surgery    Number of Visits Requested:   1   No orders of the defined types were placed in this encounter.    Discussed warning signs or symptoms. Please see discharge instructions. Patient expresses understanding.   The above documentation has been reviewed and is accurate and complete [name], M.D.

## 2022-08-01 NOTE — Patient Instructions (Signed)
Thank you for coming in today.   I've referred you for a surgical consultation with Dr. Lajoyce Corners. If you don't hear about scheduling, within 1 week, please let me know.   You received an injection today. Seek immediate medical attention if the joint becomes red, extremely painful, or is oozing fluid.   Check back as needed

## 2022-08-11 ENCOUNTER — Telehealth: Payer: Self-pay | Admitting: Gastroenterology

## 2022-08-11 NOTE — Telephone Encounter (Signed)
Patient called requesting to speak with a nurse states he is having abdominal pain and\or virus seeking advise.

## 2022-08-12 ENCOUNTER — Telehealth: Payer: Managed Care, Other (non HMO) | Admitting: Physician Assistant

## 2022-08-12 DIAGNOSIS — K529 Noninfective gastroenteritis and colitis, unspecified: Secondary | ICD-10-CM | POA: Diagnosis not present

## 2022-08-12 MED ORDER — ONDANSETRON HCL 4 MG PO TABS: 4.0000 mg | ORAL_TABLET | Freq: Three times a day (TID) | ORAL | 0 refills | Status: DC | PRN

## 2022-08-12 NOTE — Telephone Encounter (Signed)
The pt states that he was around several people with a stomach virus and he now has had some diarrhea, nausea and vomiting.  He does tell me that he is getting better and feels that it may have run its course.  He also is asking if he should reschedule his previously cancelled EGD colon with Dr Myrtie Neither.  He was advised that he can call and reschedule at his convenience. He will check his schedule and call back to set up.

## 2022-08-12 NOTE — Progress Notes (Signed)
Virtual Visit Consent   Robert Cruz, you are scheduled for a virtual visit with a [facility] provider today. Just as with appointments in the office, your consent must be obtained to participate. Your consent will be active for this visit and any virtual visit you may have with one of our providers in the next 365 days. If you have a MyChart account, a copy of this consent can be sent to you electronically.  As this is a virtual visit, video technology does not allow for your provider to perform a traditional examination. This may limit your provider's ability to fully assess your condition. If your provider identifies any concerns that need to be evaluated in person or the need to arrange testing (such as labs, EKG, etc.), we will make arrangements to do so. Although advances in technology are sophisticated, we cannot ensure that it will always work on either your end or our end. If the connection with a video visit is poor, the visit may have to be switched to a telephone visit. With either a video or telephone visit, we are not always able to ensure that we have a secure connection.  By engaging in this virtual visit, you consent to the provision of healthcare and authorize for your insurance to be billed (if applicable) for the services provided during this visit. Depending on your insurance coverage, you may receive a charge related to this service.  I need to obtain your verbal consent now. Are you willing to proceed with your visit today? Robert Cruz has provided verbal consent on 08/12/2022 for a virtual visit (video or telephone). Margaretann Loveless, PA-C  Date: 08/12/2022 2:15 PM  Virtual Visit via Video Note   I, Margaretann Loveless, connected with  Robert Cruz  ([id number], May 25, 1976) on 08/12/22 at  2:00 PM EDT by a video-enabled telemedicine application and verified that I am speaking with the correct person using two identifiers.  Location: Patient: Virtual Visit Location  Patient: Mobile Provider: Virtual Visit Location Provider: Home Office   I discussed the limitations of evaluation and management by telemedicine and the availability of in person appointments. The patient expressed understanding and agreed to proceed.    History of Present Illness: Robert Cruz is a 46 y.o. who identifies as a male who was assigned male at birth, and is being seen today for nausea and vomiting.  HPI: Emesis  This is a new problem. The current episode started in the past 7 days (tuesday to wednesday). Episode frequency: none. The problem has been unchanged. There has been no fever. Associated symptoms include abdominal pain, chills (last night), diarrhea and myalgias. Pertinent negatives include no arthralgias, coughing, dizziness, fever, headaches or URI. Risk factors include ill contacts. He has tried increased fluids and bed rest (imodium AD) for the symptoms. The treatment provided mild relief.     Problems:  Patient Active Problem List   Diagnosis Date Noted   Right Achilles tendinitis 06/03/2022   Chronic gout 03/30/2022   Diarrhea 12/16/2021   Stage 3a chronic kidney disease (HCC) 09/01/2021   Left elbow pain 10/18/2019   Mixed hyperlipidemia 09/08/2017   Allergic rhinitis 10/11/2016   Chronic migraine without aura without status migrainosus, not intractable 01/14/2016   Resistant hypertension 01/04/2016   Generalized headache 12/16/2015   Colon cancer screening 09/06/2013   GERD (gastroesophageal reflux disease) 09/06/2013   DYSPHAGIA UNSPECIFIED 02/08/2011   OSA (obstructive sleep apnea) 12/28/2008   Severe obesity (BMI >= 40) (HCC)  10/07/2008    Allergies:  Allergies  Allergen Reactions   Amlodipine Other (See Comments)    edema   Bystolic [Nebivolol Hcl] Other (See Comments)    Patient stated medication gave him major headaches   Medications:  Current Outpatient Medications:    ondansetron (ZOFRAN) 4 MG tablet, Take 1 tablet (4 mg total) by  mouth every 8 (eight) hours as needed for nausea or vomiting., Disp: 20 tablet, Rfl: 0   allopurinol (ZYLOPRIM) 300 MG tablet, Take 1 tablet (300 mg total) by mouth daily., Disp: 90 tablet, Rfl: 1   alum hydroxide-mag trisilicate (GAVISCON) 80-20 MG CHEW chewable tablet, Chew 2 tablets by mouth 4 (four) times daily as needed for indigestion or heartburn., Disp: , Rfl:    atenolol (TENORMIN) 100 MG tablet, Take 1 tablet (100 mg total) by mouth daily., Disp: 90 tablet, Rfl: 3   atorvastatin (LIPITOR) 40 MG tablet, Take 1 tablet (40 mg total) by mouth daily., Disp: 90 tablet, Rfl: 3   diclofenac (VOLTAREN) 75 MG EC tablet, Take 1 tablet (75 mg total) by mouth 2 (two) times daily as needed., Disp: 15 tablet, Rfl: 0   hydrochlorothiazide (HYDRODIURIL) 25 MG tablet, Take 1 tablet (25 mg total) by mouth daily., Disp: 90 tablet, Rfl: 3   lisinopril (ZESTRIL) 20 MG tablet, Take 1 tablet (20 mg total) by mouth daily., Disp: 90 tablet, Rfl: 3   nitroGLYCERIN (NITRODUR - DOSED IN MG/24 HR) 0.2 mg/hr patch, Apply 1/2 patch daily to tendon for tendonitis., Disp: 30 patch, Rfl: 1   omeprazole-sodium bicarbonate (ZEGERID) 40-1100 MG capsule, TAKE 1 CAPSULE BY MOUTH ONCE DAILY BEFORE BREAKFAST, Disp: 30 capsule, Rfl: 6   spironolactone (ALDACTONE) 100 MG tablet, Take 1 tablet (100 mg total) by mouth daily., Disp: 90 tablet, Rfl: 3  Observations/Objective: Patient is well-developed, well-nourished in no acute distress.  Resting comfortably at home.  Head is normocephalic, atraumatic.  No labored breathing.  Speech is clear and coherent with logical content.  Patient is alert and oriented at baseline.    Assessment and Plan: 1. Gastroenteritis - ondansetron (ZOFRAN) 4 MG tablet; Take 1 tablet (4 mg total) by mouth every 8 (eight) hours as needed for nausea or vomiting.  Dispense: 20 tablet; Refill: 0  - Suspect viral gastroenteritis - Zofran for nausea - Push fluids, electrolyte beverages - Liquid diet,  then increase to soft/bland (BRAT) diet over next day, then increase diet as tolerated - Seek in person evaluation if not improving or symptoms worsen   Follow Up Instructions: I discussed the assessment and treatment plan with the patient. The patient was provided an opportunity to ask questions and all were answered. The patient agreed with the plan and demonstrated an understanding of the instructions.  A copy of instructions were sent to the patient via MyChart unless otherwise noted below.    The patient was advised to call back or seek an in-person evaluation if the symptoms worsen or if the condition fails to improve as anticipated.  Time:  I spent 10 minutes with the patient via telehealth technology discussing the above problems/concerns.    Margaretann Loveless, PA-C

## 2022-08-12 NOTE — Telephone Encounter (Signed)
Left message on machine to call back  

## 2022-08-12 NOTE — Patient Instructions (Signed)
Robert Cruz, thank you for joining Margaretann Loveless, PA-C for today's virtual visit.  While this provider is not your primary care provider (PCP), if your PCP is located in our provider database this encounter information will be shared with them immediately following your visit.  Consent: (Patient) Robert Cruz provided verbal consent for this virtual visit at the beginning of the encounter.  Current Medications:  Current Outpatient Medications:    ondansetron (ZOFRAN) 4 MG tablet, Take 1 tablet (4 mg total) by mouth every 8 (eight) hours as needed for nausea or vomiting., Disp: 20 tablet, Rfl: 0   allopurinol (ZYLOPRIM) 300 MG tablet, Take 1 tablet (300 mg total) by mouth daily., Disp: 90 tablet, Rfl: 1   alum hydroxide-mag trisilicate (GAVISCON) 80-20 MG CHEW chewable tablet, Chew 2 tablets by mouth 4 (four) times daily as needed for indigestion or heartburn., Disp: , Rfl:    atenolol (TENORMIN) 100 MG tablet, Take 1 tablet (100 mg total) by mouth daily., Disp: 90 tablet, Rfl: 3   atorvastatin (LIPITOR) 40 MG tablet, Take 1 tablet (40 mg total) by mouth daily., Disp: 90 tablet, Rfl: 3   diclofenac (VOLTAREN) 75 MG EC tablet, Take 1 tablet (75 mg total) by mouth 2 (two) times daily as needed., Disp: 15 tablet, Rfl: 0   hydrochlorothiazide (HYDRODIURIL) 25 MG tablet, Take 1 tablet (25 mg total) by mouth daily., Disp: 90 tablet, Rfl: 3   lisinopril (ZESTRIL) 20 MG tablet, Take 1 tablet (20 mg total) by mouth daily., Disp: 90 tablet, Rfl: 3   nitroGLYCERIN (NITRODUR - DOSED IN MG/24 HR) 0.2 mg/hr patch, Apply 1/2 patch daily to tendon for tendonitis., Disp: 30 patch, Rfl: 1   omeprazole-sodium bicarbonate (ZEGERID) 40-1100 MG capsule, TAKE 1 CAPSULE BY MOUTH ONCE DAILY BEFORE BREAKFAST, Disp: 30 capsule, Rfl: 6   spironolactone (ALDACTONE) 100 MG tablet, Take 1 tablet (100 mg total) by mouth daily., Disp: 90 tablet, Rfl: 3   Medications ordered in this encounter:  Meds ordered this  encounter  Medications   ondansetron (ZOFRAN) 4 MG tablet    Sig: Take 1 tablet (4 mg total) by mouth every 8 (eight) hours as needed for nausea or vomiting.    Dispense:  20 tablet    Refill:  0    Order Specific Question:   Supervising Provider    Answer:   Hyacinth Meeker, BRIAN [[id number]]     *If you need refills on other medications prior to your next appointment, please contact your pharmacy*  Follow-Up: Call back or seek an in-person evaluation if the symptoms worsen or if the condition fails to improve as anticipated.  Other Instructions  Viral Gastroenteritis, Adult  Viral gastroenteritis is also known as the stomach flu. This condition may affect your stomach, small intestine, and large intestine. It can cause sudden watery diarrhea, fever, and vomiting. This condition is caused by many different viruses. These viruses can be passed from person to person very easily (are contagious). Diarrhea and vomiting can make you feel weak and cause you to become dehydrated. You may not be able to keep fluids down. Dehydration can make you tired and thirsty, cause you to have a dry mouth, and decrease how often you urinate. It is important to replace the fluids that you lose from diarrhea and vomiting. What are the causes? Gastroenteritis is caused by many viruses, including rotavirus and norovirus. Norovirus is the most common cause in adults. You can get sick after being exposed to the viruses from  other people. You can also get sick by: Eating food, drinking water, or touching a surface contaminated with one of these viruses. Sharing utensils or other personal items with an infected person. What increases the risk? You are more likely to develop this condition if you: Have a weak body defense system (immune system). Live with one or more children who are younger than 2 years. Live in a nursing home. Travel on cruise ships. What are the signs or symptoms? Symptoms of this condition start suddenly  1-3 days after exposure to a virus. Symptoms may last for a few days or for as long as a week. Common symptoms include watery diarrhea and vomiting. Other symptoms include: Fever. Headache. Fatigue. Pain in the abdomen. Chills. Weakness. Nausea. Muscle aches. Loss of appetite. How is this diagnosed? This condition is diagnosed with a medical history and physical exam. You may also have a stool test to check for viruses or other infections. How is this treated? This condition typically goes away on its own. The focus of treatment is to prevent dehydration and restore lost fluids (rehydration). This condition may be treated with: An oral rehydration solution (ORS) to replace important salts and minerals (electrolytes) in your body. Take this if told by your health care provider. This is a drink that is sold at pharmacies and retail stores. Medicines to help with your symptoms. Probiotic supplements to reduce symptoms of diarrhea. Fluids given through an IV, if dehydration is severe. Older adults and people with other diseases or a weak immune system are at higher risk for dehydration. Follow these instructions at home: Eating and drinking  Take an ORS as told by your health care provider. Drink clear fluids in small amounts as you are able. Clear fluids include: Water. Ice chips. Diluted fruit juice. Low-calorie sports drinks. Drink enough fluid to keep your urine pale yellow. Eat small amounts of healthy foods every 3-4 hours as you are able. This may include whole grains, fruits, vegetables, lean meats, and yogurt. Avoid fluids that contain a lot of sugar or caffeine, such as energy drinks, sports drinks, and soda. Avoid spicy or fatty foods. Avoid alcohol. General instructions  Wash your hands often, especially after having diarrhea or vomiting. If soap and water are not available, use hand sanitizer. Make sure that all people in your household wash their hands well and  often. Take over-the-counter and prescription medicines only as told by your health care provider. Rest at home while you recover. Watch your condition for any changes. Take a warm bath to relieve any burning or pain from frequent diarrhea episodes. Keep all follow-up visits. This is important. Contact a health care provider if you: Cannot keep fluids down. Have symptoms that get worse. Have new symptoms. Feel light-headed or dizzy. Have muscle cramps. Get help right away if you: Have chest pain. Have trouble breathing or you are breathing very quickly. Have a fast heartbeat. Feel extremely weak or you faint. Have a severe headache, a stiff neck, or both. Have a rash. Have severe pain, cramping, or bloating in your abdomen. Have skin that feels cold and clammy. Feel confused. Have pain when you urinate. Have signs of dehydration, such as: Dark urine, very little urine, or no urine. Cracked lips. Dry mouth. Sunken eyes. Sleepiness. Weakness. Have signs of bleeding, such as: Seeing blood in your vomit. Having vomit that looks like coffee grounds. Having bloody or black stools or stools that look like tar. These symptoms may be an emergency. Get  help right away. Call 911. Do not wait to see if the symptoms will go away. Do not drive yourself to the hospital. Summary Viral gastroenteritis is also known as the stomach flu. It can cause sudden watery diarrhea, fever, and vomiting. This condition can be passed from person to person very easily (is contagious). Take an oral rehydration solution (ORS) if told by your health care provider. This is a drink that is sold at pharmacies and retail stores. Wash your hands often, especially after having diarrhea or vomiting. If soap and water are not available, use hand sanitizer. This information is not intended to replace advice given to you by your health care provider. Make sure you discuss any questions you have with your health care  provider. Document Revised: 09/20/2021 Document Reviewed: 09/20/2021 Elsevier Patient Education  2023 Elsevier Inc.    If you have been instructed to have an in-person evaluation today at a local Urgent Care facility, please use the link below. It will take you to a list of all of our available [facility], including address, phone number and hours of operation. Please do not delay care.  [facility]  If you or a family member do not have a primary care provider, use the link below to schedule a visit and establish care. When you choose a [facility] primary care physician or advanced practice provider, you gain a long-term partner in health. Find a Primary Care Provider  Learn more about [facility] in-office and virtual care options: [facility] - Get Care Now

## 2022-08-16 ENCOUNTER — Encounter: Payer: Self-pay | Admitting: Orthopedic Surgery

## 2022-08-16 ENCOUNTER — Ambulatory Visit (INDEPENDENT_AMBULATORY_CARE_PROVIDER_SITE_OTHER): Payer: Managed Care, Other (non HMO) | Admitting: Orthopedic Surgery

## 2022-08-16 DIAGNOSIS — M7661 Achilles tendinitis, right leg: Secondary | ICD-10-CM

## 2022-08-16 NOTE — Progress Notes (Signed)
Office Visit Note   Patient: Robert Cruz           Date of Birth: 1976-07-09           MRN: [id number] Visit Date: 08/16/2022              Requested by: Rodolph Bong, MD [address] [place],  [place] [address] PCP: Avanell Shackleton, NP-C  Chief Complaint  Patient presents with   Right Ankle - Pain      HPI: Patient is a 46 year old gentleman who is seen for initial evaluation for right Achilles tendinitis.  He states this has been going on for a year.  Denies any specific injury.  Patient has undergone excellent conservative therapy including steroid injections stretching and shockwave therapy.  Assessment & Plan: Visit Diagnoses:  1. Right Achilles tendinitis     Plan: Patient was given instructions and demonstrated a different way to stretch his Achilles recommended Voltaren gel topically 3 times a day reevaluate in 4 weeks.  If there is not much improvement in the stretch of his Achilles would need to consider a gastrocnemius recession.  We will check a uric acid at follow-up.  Follow-Up Instructions: Return in about 4 weeks (around 09/13/2022).   Ortho Exam  Patient is alert, oriented, no adenopathy, well-dressed, normal affect, normal respiratory effort. Examination patient has good pulses he has dorsiflexion just short of neutral with his knee extended.  There are no palpable nodules or defects in his Achilles he has generalized tenderness to palpation at the insertion.  Review of the MRI scan shows some mild tendinopathy.  Patient states he does not have diabetes his hemoglobin A1c is 6.6.  His last uric acid was 9.7.  Imaging: No results found. No images are attached to the encounter.  Labs: Lab Results  Component Value Date   HGBA1C 6.6 (H) 06/03/2021   HGBA1C 6.6 (H) 09/04/2020   HGBA1C 6.4 07/27/2016   LABURIC 6.2 03/29/2022   LABURIC 9.7 (H) 08/31/2021   REPTSTATUS 11/12/2013 FINAL 11/09/2013   CULT  11/09/2013    No Beta Hemolytic Streptococci  Isolated Performed at [facility]     Lab Results  Component Value Date   ALBUMIN 4.3 06/03/2021   ALBUMIN 4.2 10/08/2020   ALBUMIN 4.3 09/04/2020    No results found for: "MG" Lab Results  Component Value Date   VD25OH 16.34 (L) 07/13/2018    No results found for: "PREALBUMIN"    Latest Ref Rng & Units 09/04/2020    5:04 PM 09/08/2019    8:51 PM 07/13/2018    9:31 AM  CBC EXTENDED  WBC 4.0 - 10.5 K/uL 9.5  7.5  7.7   RBC 4.22 - 5.81 Mil/uL 4.43  4.26  4.23   Hemoglobin 13.0 - 17.0 g/dL 29.9  37.1  69.6   HCT 39.0 - 52.0 % 37.1  35.7  35.5   Platelets 150.0 - 400.0 K/uL 299.0  380  337.0   NEUT# 1.4 - 7.7 K/uL 5.0  4.4  4.3   Lymph# 0.7 - 4.0 K/uL 3.3  2.3  2.4      There is no height or weight on file to calculate BMI.  Orders:  No orders of the defined types were placed in this encounter.  No orders of the defined types were placed in this encounter.    Procedures: No procedures performed  Clinical Data: No additional findings.  ROS:  All other systems negative, except as  noted in the HPI. Review of Systems  Objective: Vital Signs: There were no vitals taken for this visit.  Specialty Comments:  No specialty comments available.  PMFS History: Patient Active Problem List   Diagnosis Date Noted   Right Achilles tendinitis 06/03/2022   Chronic gout 03/30/2022   Diarrhea 12/16/2021   Stage 3a chronic kidney disease (HCC) 09/01/2021   Left elbow pain 10/18/2019   Mixed hyperlipidemia 09/08/2017   Allergic rhinitis 10/11/2016   Chronic migraine without aura without status migrainosus, not intractable 01/14/2016   Resistant hypertension 01/04/2016   Generalized headache 12/16/2015   Colon cancer screening 09/06/2013   GERD (gastroesophageal reflux disease) 09/06/2013   DYSPHAGIA UNSPECIFIED 02/08/2011   OSA (obstructive sleep apnea) 12/28/2008   Severe obesity (BMI >= 40) (HCC) 10/07/2008   Past Medical History:  Diagnosis Date    Chicken pox    GERD (gastroesophageal reflux disease)    Gout 2022   Hypertension    Morbid obesity (HCC)    Sleep apnea     Family History  Problem Relation Age of Onset   Hypertension Father    Depression Father    Hypertension Mother    Kidney disease Maternal Grandmother        Dialysis   Hypertension Maternal Grandmother    Healthy Brother        x1   Healthy Sister        x3   Colon cancer Neg Hx    Esophageal cancer Neg Hx    Rectal cancer Neg Hx    Stomach cancer Neg Hx     History reviewed. No pertinent surgical history. Social History   Occupational History   Occupation: Information systems manager: [employer]  Tobacco Use   Smoking status: Some Days    Types: Cigars   Smokeless tobacco: Never  Vaping Use   Vaping Use: Never used  Substance and Sexual Activity   Alcohol use: No   Drug use: No   Sexual activity: Not on file

## 2022-08-26 ENCOUNTER — Other Ambulatory Visit: Payer: Self-pay | Admitting: Family Medicine

## 2022-08-26 NOTE — Telephone Encounter (Signed)
Rx refill request approved per Dr. Corey's orders. 

## 2022-08-30 ENCOUNTER — Encounter (HOSPITAL_BASED_OUTPATIENT_CLINIC_OR_DEPARTMENT_OTHER): Payer: Self-pay | Admitting: Emergency Medicine

## 2022-08-30 ENCOUNTER — Other Ambulatory Visit: Payer: Self-pay

## 2022-08-30 ENCOUNTER — Emergency Department (HOSPITAL_BASED_OUTPATIENT_CLINIC_OR_DEPARTMENT_OTHER)
Admission: EM | Admit: 2022-08-30 | Discharge: 2022-08-30 | Disposition: A | Discharge status: Home / Self Care | Payer: Managed Care, Other (non HMO) | Attending: Emergency Medicine | Admitting: Emergency Medicine

## 2022-08-30 DIAGNOSIS — R09A2 Foreign body sensation, throat: Secondary | ICD-10-CM

## 2022-08-30 DIAGNOSIS — R0989 Other specified symptoms and signs involving the circulatory and respiratory systems: Secondary | ICD-10-CM | POA: Diagnosis not present

## 2022-08-30 DIAGNOSIS — K219 Gastro-esophageal reflux disease without esophagitis: Secondary | ICD-10-CM

## 2022-08-30 DIAGNOSIS — I10 Essential (primary) hypertension: Secondary | ICD-10-CM | POA: Insufficient documentation

## 2022-08-30 DIAGNOSIS — F17290 Nicotine dependence, other tobacco product, uncomplicated: Secondary | ICD-10-CM | POA: Diagnosis not present

## 2022-08-30 DIAGNOSIS — Z79899 Other long term (current) drug therapy: Secondary | ICD-10-CM | POA: Insufficient documentation

## 2022-08-30 MED ORDER — ALUM & MAG HYDROXIDE-SIMETH 200-200-20 MG/5ML PO SUSP
15.0000 mL | Freq: Once | ORAL | Status: AC
  Administered 2022-08-30: 15 mL via ORAL
  Filled 2022-08-30: qty 30

## 2022-08-30 MED ORDER — LIDOCAINE VISCOUS HCL 2 % MT SOLN
15.0000 mL | Freq: Once | OROMUCOSAL | Status: AC
  Administered 2022-08-30: 15 mL via OROMUCOSAL
  Filled 2022-08-30: qty 15

## 2022-08-30 NOTE — ED Notes (Signed)
Pt verbalizes understanding of discharge instructions. Opportunity for questioning and answers were provided. Pt discharged from ED to home.   ? ?

## 2022-08-30 NOTE — ED Provider Notes (Signed)
[facility] Provider Note: Georgena Spurling, MD, FACEP  CSN: [id number] MRN: [id number] ARRIVAL: 08/30/22 at [place]: [place]  Gastroesophageal Reflux   HISTORY OF PRESENT ILLNESS  08/30/22 2:56 AM Dianna Limbo is a 46 y.o. male with a history of acid reflux on Zegerid daily.  He awakened from sleep about midnight with an exacerbation of his GERD symptoms, particularly a globus sensation in his throat.  It is worse when he lies supine and improves when he sits upright.  He has taken multiple doses of antiacid without relief.  He denies actual throat pain.  He denies chest pain.  He denies shortness of breath.  He denies difficulty swallowing.   Past Medical History:  Diagnosis Date   Chicken pox    GERD (gastroesophageal reflux disease)    Gout 2022   Hypertension    Morbid obesity ([facility])    Sleep apnea     History reviewed. No pertinent surgical history.  Family History  Problem Relation Age of Onset   Hypertension Father    Depression Father    Hypertension Mother    Kidney disease Maternal Grandmother        Dialysis   Hypertension Maternal Grandmother    Healthy Brother        x1   Healthy Sister        x3   Colon cancer Neg Hx    Esophageal cancer Neg Hx    Rectal cancer Neg Hx    Stomach cancer Neg Hx     Social History   Tobacco Use   Smoking status: Some Days    Types: Cigars   Smokeless tobacco: Never  Vaping Use   Vaping Use: Never used  Substance Use Topics   Alcohol use: No   Drug use: No    Prior to Admission medications   Medication Sig Start Date End Date Taking? Authorizing Provider  allopurinol (ZYLOPRIM) 300 MG tablet TAKE 1 TABLET BY MOUTH EVERY DAY 08/26/22   Gregor Hams, MD  alum hydroxide-mag trisilicate (GAVISCON) 02-58 MG CHEW chewable tablet Chew 2 tablets by mouth 4 (four) times daily as needed for indigestion or heartburn.    [provider]  atenolol (TENORMIN) 100 MG tablet Take 1  tablet (100 mg total) by mouth daily. 12/31/21   Nahser, Wonda Cheng, MD  atorvastatin (LIPITOR) 40 MG tablet Take 1 tablet (40 mg total) by mouth daily. 12/31/21   Nahser, Wonda Cheng, MD  diclofenac (VOLTAREN) 75 MG EC tablet Take 1 tablet (75 mg total) by mouth 2 (two) times daily as needed. 05/30/22   Gregor Hams, MD  hydrochlorothiazide (HYDRODIURIL) 25 MG tablet Take 1 tablet (25 mg total) by mouth daily. 12/31/21   Nahser, Wonda Cheng, MD  lisinopril (ZESTRIL) 20 MG tablet Take 1 tablet (20 mg total) by mouth daily. 12/31/21   Nahser, Wonda Cheng, MD  nitroGLYCERIN (NITRODUR - DOSED IN MG/24 HR) 0.2 mg/hr patch Apply 1/2 patch daily to tendon for tendonitis. 07/07/22   Gregor Hams, MD  omeprazole-sodium bicarbonate (ZEGERID) 40-1100 MG capsule TAKE 1 CAPSULE BY MOUTH ONCE DAILY BEFORE BREAKFAST 06/13/22   Zehr, Janett Billow D, PA-C  ondansetron (ZOFRAN) 4 MG tablet Take 1 tablet (4 mg total) by mouth every 8 (eight) hours as needed for nausea or vomiting. 08/12/22   Mar Daring, PA-C  spironolactone (ALDACTONE) 100 MG tablet Take 1 tablet (100 mg total) by mouth daily. 12/31/21   Nahser, Arnette Norris  J, MD    Allergies Amlodipine and Bystolic [nebivolol hcl]   REVIEW OF SYSTEMS  Negative except as noted here or in the History of Present Illness.   PHYSICAL EXAMINATION  Initial Vital Signs Blood pressure (!) 136/90, pulse 78, temperature 98.3 F (36.8 C), temperature source Oral, resp. rate 16, height 6\' 2"  (1.88 m), weight (!) 145.2 kg, SpO2 99 %.  Examination General: Well-developed, well-nourished male in no acute distress; appearance consistent with age of record HENT: normocephalic; atraumatic; no pharyngeal erythema or exudate; no dysphonia; no stridor Eyes: pupils equal, round and reactive to light; extraocular muscles intact Neck: supple Heart: regular rate and rhythm Lungs: clear to auscultation bilaterally Abdomen: soft; nondistended; nontender; bowel sounds present Extremities: No  deformity; full range of motion; pulses normal Neurologic: Awake, alert and oriented; motor function intact in all extremities and symmetric; no facial droop Skin: Warm and dry Psychiatric: Normal mood and affect   RESULTS  Summary of this visit's results, reviewed and interpreted by myself:   EKG Interpretation  Date/Time:  Tuesday August 30 2022 03:04:26 EDT Ventricular Rate:  73 PR Interval:  181 QRS Duration: 92 QT Interval:  387 QTC Calculation: 427 R Axis:   34 Text Interpretation: Sinus rhythm Normal ECG No significant change was found Confirmed by Ermagene Saidi (03-29-1982) on 08/30/2022 3:08:57 AM       Laboratory Studies: No results found for this or any previous visit (from the past 24 hour(s)). Imaging Studies: No results found.  ED COURSE and MDM  Nursing notes, initial and subsequent vitals signs, including pulse oximetry, reviewed and interpreted by myself.  Vitals:   08/30/22 0249 08/30/22 0253  BP:  (!) 136/90  Pulse:  78  Resp:  16  Temp:  98.3 F (36.8 C)  TempSrc:  Oral  SpO2:  99%  Weight: (!) 145.2 kg   Height: 6\' 2"  (1.88 m)    Medications  alum & mag hydroxide-simeth (MAALOX/MYLANTA) 200-200-20 MG/5ML suspension 15 mL (15 mLs Oral Given 08/30/22 0312)  lidocaine (XYLOCAINE) 2 % viscous mouth solution 15 mL (15 mLs Mouth/Throat Given 08/30/22 0312)   3:25 AM Patient got immediate improvement with viscous lidocaine and Maalox by mouth although the effects are now waning as is often the case.  I suspect he had an episode of acid reflux in his sleep that actually burned his throat causing a persistent globus sensation its not relieved by antacids.  He plans to go home and sleep sitting up in his recliner and contact his gastroenterologist later today.  His EKG is normal and the nature of his symptoms are not consistent with cardiac etiology.   PROCEDURES  Procedures   ED DIAGNOSES     ICD-10-CM   1. Gastroesophageal reflux disease without  esophagitis  K21.9     2. Globus sensation  R09.89          09/01/22, MD 08/30/22 [phone]

## 2022-08-30 NOTE — ED Triage Notes (Signed)
Reports GERD symptoms, feels like something in his throat. Worse when laying down. Not relieved with antiacids or omeprazole. Hx of GERD but worse tonight.

## 2022-09-05 ENCOUNTER — Ambulatory Visit (INDEPENDENT_AMBULATORY_CARE_PROVIDER_SITE_OTHER): Payer: Managed Care, Other (non HMO)

## 2022-09-05 DIAGNOSIS — Z23 Encounter for immunization: Secondary | ICD-10-CM | POA: Diagnosis not present

## 2022-09-15 ENCOUNTER — Ambulatory Visit (INDEPENDENT_AMBULATORY_CARE_PROVIDER_SITE_OTHER): Payer: Managed Care, Other (non HMO) | Admitting: Orthopedic Surgery

## 2022-09-15 DIAGNOSIS — M7661 Achilles tendinitis, right leg: Secondary | ICD-10-CM

## 2022-09-16 ENCOUNTER — Encounter: Payer: Self-pay | Admitting: Orthopedic Surgery

## 2022-09-16 NOTE — Progress Notes (Signed)
Office Visit Note   Patient: Robert Cruz           Date of Birth: [birth year]           MRN: [id number] Visit Date: 09/15/2022              Requested by: Girtha Rm, NP-C [place],  [place] [address] PCP: Girtha Rm, NP-C  Chief Complaint  Patient presents with   Right Achilles Tendon - Follow-up      HPI: Patient is a 46 year old gentleman with Achilles tendinitis on the right.  Patient states the initially had pain mid substance of the Achilles now he has insertional pain.  Patient states that he has been taking prednisone on and off currently using 30 mg as needed.  Patient states he has difficulty doing exercise secondary to the pain.  Patient has undergone extracorporal shockwave therapy without relief he has used a quarter of a nitroglycerin patch with temporary relief.  He states that the insertional tendinitis is less painful on the weekend and more painful at work.  Assessment & Plan: Visit Diagnoses:  1. Right Achilles tendinitis     Plan: Recommended patient use a nitroglycerin patch every morning and 10 mg of prednisone every morning continue with stretching and continue with the heel lift.  Discussed that the surgical option would be to proceed with a gastrocnemius recession and he could still have persistent pain.  Plan for drawing a uric acid level at follow-up.  Follow-Up Instructions: Return in about 3 weeks (around 10/06/2022).   Ortho Exam  Patient is alert, oriented, no adenopathy, well-dressed, normal affect, normal respiratory effort. Examination patient has a good dorsalis pedis pulse he has good ankle and subtalar motion he only has dorsiflexion to neutral.  The mid substance of the Achilles has no defects no nodules no tenderness to palpation at this time.  He is tender at the insertion of the Achilles.  There is no redness no cellulitis.  Imaging: No results found. No images are attached to the encounter.  Labs: Lab  Results  Component Value Date   HGBA1C 6.6 (H) 06/03/2021   HGBA1C 6.6 (H) 09/04/2020   HGBA1C 6.4 07/27/2016   LABURIC 6.2 03/29/2022   LABURIC 9.7 (H) 08/31/2021   REPTSTATUS 11/12/2013 FINAL 11/09/2013   CULT  11/09/2013    No Beta Hemolytic Streptococci Isolated Performed at [facility]     Lab Results  Component Value Date   ALBUMIN 4.3 06/03/2021   ALBUMIN 4.2 10/08/2020   ALBUMIN 4.3 09/04/2020    No results found for: "MG" Lab Results  Component Value Date   VD25OH 16.34 (L) 07/13/2018    No results found for: "PREALBUMIN"    Latest Ref Rng & Units 09/04/2020    5:04 PM 09/08/2019    8:51 PM 07/13/2018    9:31 AM  CBC EXTENDED  WBC 4.0 - 10.5 K/uL 9.5  7.5  7.7   RBC 4.22 - 5.81 Mil/uL 4.43  4.26  4.23   Hemoglobin 13.0 - 17.0 g/dL 12.2  11.4  11.8   HCT 39.0 - 52.0 % 37.1  35.7  35.5   Platelets 150.0 - 400.0 K/uL 299.0  380  337.0   NEUT# 1.4 - 7.7 K/uL 5.0  4.4  4.3   Lymph# 0.7 - 4.0 K/uL 3.3  2.3  2.4      There is no height or weight on file to calculate BMI.  Orders:  No orders of the defined types were placed in this encounter.  No orders of the defined types were placed in this encounter.    Procedures: No procedures performed  Clinical Data: No additional findings.  ROS:  All other systems negative, except as noted in the HPI. Review of Systems  Objective: Vital Signs: There were no vitals taken for this visit.  Specialty Comments:  No specialty comments available.  PMFS History: Patient Active Problem List   Diagnosis Date Noted   Right Achilles tendinitis 06/03/2022   Chronic gout 03/30/2022   Diarrhea 12/16/2021   Stage 3a chronic kidney disease ([place]) 09/01/2021   Left elbow pain 10/18/2019   Mixed hyperlipidemia 09/08/2017   Allergic rhinitis 10/11/2016   Chronic migraine without aura without status migrainosus, not intractable 01/14/2016   Resistant hypertension 01/04/2016   Generalized headache 12/16/2015    Colon cancer screening 09/06/2013   GERD (gastroesophageal reflux disease) 09/06/2013   DYSPHAGIA UNSPECIFIED 02/08/2011   OSA (obstructive sleep apnea) 12/28/2008   Severe obesity (BMI >= 40) ([place]) 10/07/2008   Past Medical History:  Diagnosis Date   Chicken pox    GERD (gastroesophageal reflux disease)    Gout 2022   Hypertension    Morbid obesity ([place])    Sleep apnea     Family History  Problem Relation Age of Onset   Hypertension Father    Depression Father    Hypertension Mother    Kidney disease Maternal Grandmother        Dialysis   Hypertension Maternal Grandmother    Healthy Brother        x1   Healthy Sister        x3   Colon cancer Neg Hx    Esophageal cancer Neg Hx    Rectal cancer Neg Hx    Stomach cancer Neg Hx     History reviewed. No pertinent surgical history. Social History   Occupational History   Occupation: Psychologist, occupational: [employer]  Tobacco Use   Smoking status: Some Days    Types: Cigars   Smokeless tobacco: Never  Vaping Use   Vaping Use: Never used  Substance and Sexual Activity   Alcohol use: No   Drug use: No   Sexual activity: Not on file

## 2022-09-27 ENCOUNTER — Ambulatory Visit: Payer: Managed Care, Other (non HMO) | Admitting: Family Medicine

## 2022-10-04 ENCOUNTER — Encounter: Payer: Self-pay | Admitting: Emergency Medicine

## 2022-10-04 ENCOUNTER — Ambulatory Visit (INDEPENDENT_AMBULATORY_CARE_PROVIDER_SITE_OTHER): Payer: Managed Care, Other (non HMO) | Admitting: Emergency Medicine

## 2022-10-04 VITALS — BP 136/78 | HR 84 | Temp 98.1°F | Ht 74.0 in | Wt 344.5 lb

## 2022-10-04 DIAGNOSIS — J22 Unspecified acute lower respiratory infection: Secondary | ICD-10-CM

## 2022-10-04 DIAGNOSIS — R051 Acute cough: Secondary | ICD-10-CM | POA: Diagnosis not present

## 2022-10-04 MED ORDER — HYDROCODONE BIT-HOMATROP MBR 5-1.5 MG/5ML PO SOLN: 5.0000 mL | Freq: Every evening | ORAL | 0 refills | Status: DC | PRN

## 2022-10-04 MED ORDER — AZITHROMYCIN 250 MG PO TABS: ORAL_TABLET | ORAL | 0 refills | Status: DC

## 2022-10-04 NOTE — Patient Instructions (Signed)

## 2022-10-04 NOTE — Assessment & Plan Note (Signed)
Viral upper respiratory infection with secondary bacterial infection of lower respiratory tract.  No pneumonia.  Afebrile. May benefit from daily azithromycin for 5 days. ED precautions given. Advised to contact the office if no better or worse during the next several days.

## 2022-10-04 NOTE — Progress Notes (Signed)
Robert Cruz 46 y.o.   Chief Complaint  Patient presents with   Cough    On and off cough x 2 weeks.... Covid negative     HISTORY OF PRESENT ILLNESS: This is a 46 y.o. male complaining of 2-week history of persistent productive cough. Tested negative for COVID twice. No difficulty breathing or wheezing. Denies chest pain or any other associated symptoms. No other complaints or medical concerns today.  Cough Pertinent negatives include no chest pain, chills, fever, headaches or rash.     Prior to Admission medications   Medication Sig Start Date End Date Taking? Authorizing Provider  allopurinol (ZYLOPRIM) 300 MG tablet TAKE 1 TABLET BY MOUTH EVERY DAY 08/26/22   Gregor Hams, MD  alum hydroxide-mag trisilicate (GAVISCON) 53-61 MG CHEW chewable tablet Chew 2 tablets by mouth 4 (four) times daily as needed for indigestion or heartburn.    [provider]  atenolol (TENORMIN) 100 MG tablet Take 1 tablet (100 mg total) by mouth daily. 12/31/21   Nahser, Wonda Cheng, MD  atorvastatin (LIPITOR) 40 MG tablet Take 1 tablet (40 mg total) by mouth daily. 12/31/21   Nahser, Wonda Cheng, MD  diclofenac (VOLTAREN) 75 MG EC tablet Take 1 tablet (75 mg total) by mouth 2 (two) times daily as needed. 05/30/22   Gregor Hams, MD  hydrochlorothiazide (HYDRODIURIL) 25 MG tablet Take 1 tablet (25 mg total) by mouth daily. 12/31/21   Nahser, Wonda Cheng, MD  lisinopril (ZESTRIL) 20 MG tablet Take 1 tablet (20 mg total) by mouth daily. 12/31/21   Nahser, Wonda Cheng, MD  nitroGLYCERIN (NITRODUR - DOSED IN MG/24 HR) 0.2 mg/hr patch Apply 1/2 patch daily to tendon for tendonitis. 07/07/22   Gregor Hams, MD  omeprazole-sodium bicarbonate (ZEGERID) 40-1100 MG capsule TAKE 1 CAPSULE BY MOUTH ONCE DAILY BEFORE BREAKFAST 06/13/22   Zehr, Janett Billow D, PA-C  ondansetron (ZOFRAN) 4 MG tablet Take 1 tablet (4 mg total) by mouth every 8 (eight) hours as needed for nausea or vomiting. 08/12/22   Mar Daring, PA-C   spironolactone (ALDACTONE) 100 MG tablet Take 1 tablet (100 mg total) by mouth daily. 12/31/21   Nahser, Wonda Cheng, MD    Allergies  Allergen Reactions   Amlodipine Other (See Comments)    edema   Bystolic [Nebivolol Hcl] Other (See Comments)    Patient stated medication gave him major headaches    Patient Active Problem List   Diagnosis Date Noted   Right Achilles tendinitis 06/03/2022   Chronic gout 03/30/2022   Diarrhea 12/16/2021   Stage 3a chronic kidney disease ([place]) 09/01/2021   Left elbow pain 10/18/2019   Mixed hyperlipidemia 09/08/2017   Allergic rhinitis 10/11/2016   Chronic migraine without aura without status migrainosus, not intractable 01/14/2016   Resistant hypertension 01/04/2016   Generalized headache 12/16/2015   Colon cancer screening 09/06/2013   GERD (gastroesophageal reflux disease) 09/06/2013   DYSPHAGIA UNSPECIFIED 02/08/2011   OSA (obstructive sleep apnea) 12/28/2008   Severe obesity (BMI >= 40) ([place]) 10/07/2008    Past Medical History:  Diagnosis Date   Chicken pox    GERD (gastroesophageal reflux disease)    Gout 2022   Hypertension    Morbid obesity ([place])    Sleep apnea     No past surgical history on file.  Social History   Socioeconomic History   Marital status: Married    Spouse name: Not on file   Number of children: 5   Years of education:  13   Highest education level: Not on file  Occupational History   Occupation: Information systems manager: [employer]  Tobacco Use   Smoking status: Some Days    Types: Cigars   Smokeless tobacco: Never  Vaping Use   Vaping Use: Never used  Substance and Sexual Activity   Alcohol use: No   Drug use: No   Sexual activity: Not on file  Other Topics Concern   Not on file  Social History Narrative   Right handed   Two Story Home    Lives with wife and family   Drinks Caffeine everyday   Social Determinants of Health   Financial Resource Strain: Not on file  Food Insecurity: Not on  file  Transportation Needs: Not on file  Physical Activity: Not on file  Stress: Not on file  Social Connections: Not on file  Intimate Partner Violence: Not on file    Family History  Problem Relation Age of Onset   Hypertension Father    Depression Father    Hypertension Mother    Kidney disease Maternal Grandmother        Dialysis   Hypertension Maternal Grandmother    Healthy Brother        x1   Healthy Sister        x3   Colon cancer Neg Hx    Esophageal cancer Neg Hx    Rectal cancer Neg Hx    Stomach cancer Neg Hx      Review of Systems  Constitutional: Negative.  Negative for chills and fever.  HENT:  Positive for congestion.   Respiratory:  Positive for cough.   Cardiovascular: Negative.  Negative for chest pain and palpitations.  Gastrointestinal: Negative.  Negative for abdominal pain, diarrhea, nausea and vomiting.  Genitourinary: Negative.   Skin: Negative.  Negative for rash.  Neurological: Negative.  Negative for dizziness and headaches.  All other systems reviewed and are negative.  Today's Vitals   10/04/22 1551  Pulse: 84  Temp: 98.1 F (36.7 C)  TempSrc: Oral  SpO2: 95%  Weight: (!) 344 lb 8 oz (156.3 kg)  Height: 6\' 2"  (1.88 m)   Body mass index is 44.23 kg/m.   Physical Exam Vitals reviewed.  Constitutional:      Appearance: Normal appearance. He is obese.  HENT:     Head: Normocephalic.     Mouth/Throat:     Mouth: Mucous membranes are moist.     Pharynx: Oropharynx is clear.  Eyes:     Extraocular Movements: Extraocular movements intact.     Conjunctiva/sclera: Conjunctivae normal.     Pupils: Pupils are equal, round, and reactive to light.  Cardiovascular:     Rate and Rhythm: Normal rate and regular rhythm.     Pulses: Normal pulses.     Heart sounds: Normal heart sounds.  Pulmonary:     Effort: Pulmonary effort is normal.     Breath sounds: Normal breath sounds.  Skin:    General: Skin is warm and dry.  Neurological:      Mental Status: He is alert and oriented to person, place, and time.  Psychiatric:        Mood and Affect: Mood normal.        Behavior: Behavior normal.      ASSESSMENT & PLAN: Problem List Items Addressed This Visit       Respiratory   Lower respiratory infection    Viral upper respiratory infection with secondary  bacterial infection of lower respiratory tract.  No pneumonia.  Afebrile. May benefit from daily azithromycin for 5 days. ED precautions given. Advised to contact the office if no better or worse during the next several days.      Relevant Medications   azithromycin (ZITHROMAX) 250 MG tablet     Other   Acute cough - Primary    Take over-the-counter Mucinex DM and cough drops. Hycodan syrup as needed Advised to stay well-hydrated and rest.      Relevant Medications   HYDROcodone bit-homatropine (HYCODAN) 5-1.5 MG/5ML syrup   Patient Instructions  Cough, Adult A cough helps to clear your throat and lungs. A cough may be a sign of an illness or another medical condition. An acute cough may only last 2-3 weeks, while a chronic cough may last 8 or more weeks. Many things can cause a cough. They include: Germs (viruses or bacteria) that attack the airway. Breathing in things that bother (irritate) your lungs. Allergies. Asthma. Mucus that runs down the back of your throat (postnasal drip). Smoking. Acid backing up from the stomach into the tube that moves food from the mouth to the stomach (gastroesophageal reflux). Some medicines. Lung problems. Other medical conditions, such as heart failure or a blood clot in the lung (pulmonary embolism). Follow these instructions at home: Medicines Take over-the-counter and prescription medicines only as told by your doctor. Talk with your doctor before you take medicines that stop a cough (cough suppressants). Lifestyle  Do not smoke, and try not to be around smoke. Do not use any products that contain nicotine  or tobacco, such as cigarettes, e-cigarettes, and chewing tobacco. If you need help quitting, ask your doctor. Drink enough fluid to keep your pee (urine) pale yellow. Avoid caffeine. Do not drink alcohol if your doctor tells you not to drink. General instructions  Watch for any changes in your cough. Tell your doctor about them. Always cover your mouth when you cough. Stay away from things that make you cough, such as perfume, candles, campfire smoke, or cleaning products. If the air is dry, use a cool mist vaporizer or humidifier in your home. If your cough is worse at night, try using extra pillows to raise your head up higher while you sleep. Rest as needed. Keep all follow-up visits as told by your doctor. This is important. Contact a doctor if: You have new symptoms. You cough up pus. Your cough does not get better after 2-3 weeks, or your cough gets worse. Cough medicine does not help your cough and you are not sleeping well. You have pain that gets worse or pain that is not helped with medicine. You have a fever. You are losing weight and you do not know why. You have night sweats. Get help right away if: You cough up blood. You have trouble breathing. Your heartbeat is very fast. These symptoms may be an emergency. Do not wait to see if the symptoms will go away. Get medical help right away. Call your local emergency services (911 in the [place] Do not drive yourself to the hospital. Summary A cough helps to clear your throat and lungs. Many things can cause a cough. Take over-the-counter and prescription medicines only as told by your doctor. Always cover your mouth when you cough. Contact a doctor if you have new symptoms or you have a cough that does not get better or gets worse. This information is not intended to replace advice given to you by your health  care provider. Make sure you discuss any questions you have with your health care provider. Document Revised:  01/10/2020 Document Reviewed: 12/10/2018 Elsevier Patient Education  2023 Elsevier Inc.     Edwina Barth, MD [facility]

## 2022-10-04 NOTE — Assessment & Plan Note (Signed)
Take over-the-counter Mucinex DM and cough drops. Hycodan syrup as needed Advised to stay well-hydrated and rest.

## 2022-10-10 ENCOUNTER — Ambulatory Visit: Payer: Managed Care, Other (non HMO) | Admitting: Orthopedic Surgery

## 2022-10-11 ENCOUNTER — Ambulatory Visit: Payer: Managed Care, Other (non HMO) | Admitting: Family

## 2022-10-11 ENCOUNTER — Other Ambulatory Visit: Payer: Self-pay | Admitting: Family Medicine

## 2022-10-12 NOTE — Telephone Encounter (Signed)
Rx refill request approved per Dr. Corey's orders. 

## 2022-10-26 ENCOUNTER — Ambulatory Visit: Payer: Self-pay

## 2022-10-26 ENCOUNTER — Ambulatory Visit: Payer: Managed Care, Other (non HMO) | Admitting: Family Medicine

## 2022-10-26 VITALS — BP 138/86 | HR 88 | Ht 74.0 in | Wt 344.0 lb

## 2022-10-26 DIAGNOSIS — M25512 Pain in left shoulder: Secondary | ICD-10-CM

## 2022-10-26 DIAGNOSIS — G8929 Other chronic pain: Secondary | ICD-10-CM

## 2022-10-26 DIAGNOSIS — M7661 Achilles tendinitis, right leg: Secondary | ICD-10-CM

## 2022-10-26 NOTE — Patient Instructions (Addendum)
Thank you for coming in today.   You received an injection today. Seek immediate medical attention if the joint becomes red, extremely painful, or is oozing fluid.   Try night splint for plantar fasciitis  Try heel lift. [employer] makes good ones.

## 2022-10-26 NOTE — Progress Notes (Signed)
   I, Robert Cruz, LAT, ATC acting as a scribe for Robert Graham, MD.  Robert Cruz is a 46 y.o. male who presents to [facility] today for L shoulder pain. Pt was last seen by Dr. [name] on 08/01/22 and was given a L subacromial steroid injection and was referred to orthopedic surgery for his R Achilles tendinopathy. Today, pt reports prior last L shoulder injection did not last. Pt notes he going on a cruise next week.   Dr. Lajoyce Corners recommended a retrial of physical therapy for his Achilles tendinitis prior to surgery.  Dx imaging: 07/17/22 L shoulder MRI             04/29/22 L shoulder XR  Pertinent review of systems: No fevers or chills  Relevant historical information: Sleep apnea.   Exam:  BP 138/86   Pulse 88   Ht 6\' 2"  (1.88 m)   Wt (!) 344 lb (156 kg)   SpO2 97%   BMI 44.17 kg/m  General: Well Developed, well nourished, and in no acute distress.   MSK: Left shoulder normal-appearing normal motion pain with abduction.  Positive Hawkins and Neer's test.    Lab and Radiology Results  Procedure: Real-time Ultrasound Guided Injection of left shoulder subacromial bursa Device: Philips Affiniti 50G Images permanently stored and available for review in PACS Verbal informed consent obtained.  Discussed risks and benefits of procedure. Warned about infection, bleeding, hyperglycemia damage to structures among others. Patient expresses understanding and agreement Time-out conducted.   Noted no overlying erythema, induration, or other signs of local infection.   Skin prepped in a sterile fashion.   Local anesthesia: Topical Ethyl chloride.   With sterile technique and under real time ultrasound guidance: 40 mg of Kenalog and 2 mL of Marcaine injected into subacromial bursa. Fluid seen entering the bursa.   Completed without difficulty   Pain immediately resolved suggesting accurate placement of the medication.   Advised to call if fevers/chills,  erythema, induration, drainage, or persistent bleeding.   Images permanently stored and available for review in the ultrasound unit.  Impression: Technically successful ultrasound guided injection.         Assessment and Plan: 46 y.o. 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  This is a chronic issue with an occurrence.  Plan for repeat steroid injection today.  If this does not work as well as we would like we may want to try a glenohumeral injection.  As for his Achilles tendinitis agree with physical therapy trial.  Will try also night splints and heel lifts.   PDMP not reviewed this encounter. Orders Placed This Encounter  Procedures   49 LIMITED JOINT SPACE STRUCTURES UP LEFT(NO LINKED CHARGES)    Order Specific Question:   Reason for Exam (SYMPTOM  OR DIAGNOSIS REQUIRED)    Answer:   left shoulder pain    Order Specific Question:   Preferred imaging location?    Answer:   [facility]   No orders of the defined types were placed in this encounter.    Discussed warning signs or symptoms. Please see discharge instructions. Patient expresses understanding.   The above documentation has been reviewed and is accurate and complete US, M.D.

## 2022-11-24 ENCOUNTER — Encounter: Payer: Self-pay | Admitting: Cardiovascular Disease

## 2022-11-24 ENCOUNTER — Ambulatory Visit: Payer: Managed Care, Other (non HMO) | Attending: Cardiovascular Disease | Admitting: Cardiovascular Disease

## 2022-11-24 VITALS — BP 128/78 | HR 88 | Ht 74.0 in | Wt 344.0 lb

## 2022-11-24 DIAGNOSIS — E782 Mixed hyperlipidemia: Secondary | ICD-10-CM | POA: Diagnosis not present

## 2022-11-24 DIAGNOSIS — I10 Essential (primary) hypertension: Secondary | ICD-10-CM | POA: Diagnosis not present

## 2022-11-24 NOTE — Progress Notes (Signed)
Cardiology Office Note   Date:  11/24/2022   ID:  [name], DOB [birth year], MRN [id number]  PCP:  Avanell Shackleton, NP-C  Cardiologist:   Kristeen Miss, MD   Chief Complaint  Patient presents with   Hypertension        Problem List 1. Essential HTN 2. Obstructive sleep apnea 3, GERD    Previous notes:  Robert Cruz is a 46 y.o. male who presents for evaluation of his HTN He has been on multiple meds with minimal response  Very busy at work .    Not a lot of time to exercise  Has been on a better diet for the past 1 year  or so.  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  No DOE doing fairly moderate exercise ( ie no dyspnea walking up the stadium steps at the [employer] a month ago )  Has not lost any weight on his new diet.   Typically break fast - eggs, [place] sausage /turkey bacon  Lunch - subway - 6 inch or salad   - used to eat lots of fried foods, [employer], chick fil a  Dinner - spag. Occasionally take out from [employer]   Apr 05, 2016:  Robert Cruz is seen back today for follow up of his HTN Echo shows normal LV function with moderate LAE  Has tried clonidine -  Was not effective so it was stopped  Hydralazine was substituted.  BP is still high  Has tried Bystolic - caused a severe headache Still drinking lots of sweet tea Lots of juices. Still eats out quite a bit .  Weight is 343 today .  I advised him to lose 5-10 lbs before his next visit .   May 09, 2016  weight is 341 today  BP is elevated.  Has OSA - has been using CPAP , may need some adjustment on the mask gasket  Has been working lots - worked 24 straight days  Avoiding salt    Oct. 18, 2017:  Robert Cruz is seen back for office visit for HTN Has done well on the Aldactone Feeling better.  Headaches have resolved,   Leg edema has resolved.   09/08/2017 Works now for [employer].  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 Still drinking sweet tea.   Wt = 334 ( down 6 lbs from last year)  Having some abd. Pain after eating  Symptoms sound like esophageal stricture /   hiatal hernia / or  GERD  Has had esophageal dilitation in the past.  Has cut out greasy foods.   Does eat some bacon on occasion.   September 26, 2018:  Robert Cruz is seen today for follow-up visit.  He has a history of morbid obesity, hypertension, hyperlipidemia.  His weight today is 345 pounds 11 pounds from last year. Trying to avoid salt .  Is not drinking sweat tea .  Will be seeing a nutritiionist this week .  No CP or dyspnea.  Working for [employer]   February 25, 2020:   Doing well Wt is down 35 lbs from 2019 Is exercising  Some .  Is married  December 11, 2020: Robert Cruz is seen today for follow-up visit.  Vital signs look good.  His weight  today is 327 pounds   Jan. 27, 2023 Robert Cruz is seen today for follow up of his obesity, HTN,HLD 339 lbs today   Eating too much bread.  Not exercising   Dec. 21, 2023   Has been having CP ,  thinks it may be GERD ,  No CP with exercise     Past Medical History:  Diagnosis Date   Chicken pox    GERD (gastroesophageal reflux disease)    Gout 2022   Hypertension    Morbid obesity (HCC)    Sleep apnea     History reviewed. No pertinent surgical history.   Current Outpatient Medications  Medication Sig Dispense Refill   allopurinol (ZYLOPRIM) 100 MG tablet TAKE 1 TABLET BY MOUTH EVERY DAY 90 tablet 3   alum hydroxide-mag trisilicate (GAVISCON) 80-20 MG CHEW chewable tablet Chew 2 tablets by mouth 4 (four) times daily as needed for indigestion or heartburn.     atenolol (TENORMIN) 100 MG tablet Take 1 tablet (100 mg total) by mouth daily. 90 tablet 3   atorvastatin (LIPITOR) 40 MG tablet Take 1 tablet (40 mg total) by mouth daily. 90 tablet 3   hydrochlorothiazide (HYDRODIURIL) 25 MG tablet Take 1  tablet (25 mg total) by mouth daily. 90 tablet 3   lisinopril (ZESTRIL) 20 MG tablet Take 1 tablet (20 mg total) by mouth daily. 90 tablet 3   nitroGLYCERIN (NITRODUR - DOSED IN MG/24 HR) 0.2 mg/hr patch Apply 1/2 patch daily to tendon for tendonitis. 30 patch 1   omeprazole-sodium bicarbonate (ZEGERID) 40-1100 MG capsule TAKE 1 CAPSULE BY MOUTH ONCE DAILY BEFORE BREAKFAST 30 capsule 6   spironolactone (ALDACTONE) 100 MG tablet Take 1 tablet (100 mg total) by mouth daily. 90 tablet 3   No current facility-administered medications for this visit.    Allergies:   Amlodipine and Bystolic [nebivolol hcl]    Social History:  The patient  reports that he has been smoking cigars. He has never used smokeless tobacco. He reports that he does not drink alcohol and does not use drugs.   Family History:  The patient's family history includes Depression in his father; Healthy in his brother and sister; Hypertension in his father, maternal grandmother, and mother; Kidney disease in his maternal grandmother.    ROS:  Please see the history of present illness.   Physical Exam: Blood pressure 128/78, pulse 88, height 6\' 2"  (1.88 m), weight (!) 344 lb (156 kg), SpO2 96 %.       GEN:  Well nourished, well developed in no acute distress HEENT: Normal NECK: No JVD; No carotid bruits LYMPHATICS: No lymphadenopathy CARDIAC: RRR , no murmurs, rubs, gallops RESPIRATORY:  Clear to auscultation without rales, wheezing or rhonchi  ABDOMEN: Soft, non-tender, non-distended MUSCULOSKELETAL:  No edema; No deformity  SKIN: Warm and dry NEUROLOGIC:  Alert and oriented x 3    EKG:      Recent Labs: 12/31/2021: ALT 15 03/29/2022: BUN 22; Creatinine, Ser 1.38; Potassium 4.0; Sodium 134    Lipid Panel    Component Value Date/Time   CHOL 120 12/31/2021 1636   TRIG 121 12/31/2021 1636   HDL 36 (L) 12/31/2021 1636   CHOLHDL 3.3 12/31/2021 1636   CHOLHDL 4 09/04/2020 1704   VLDL 15.6 09/04/2020 1704    LDLCALC 62 12/31/2021 1636      Wt Readings from Last 3 Encounters:  11/24/22 (!) 344 lb (156 kg)  10/26/22 (!) 344 lb (156 kg)  10/04/22 (!) 344 lb  8 oz (156.3 kg)      Other studies Reviewed: Additional studies/ records that were reviewed today include: . Review of the above records demonstrates:    ASSESSMENT AND PLAN:  1.  Essential HTN-  .       Blood pressure is very well-controlled today.  Continue current medications.  BP is well controlled.   Con t meds Needs to work on weight loss      2. Obesity :   Advised better diet.  Have given him some outlines on a healthier diet.  Of also discussed limiting carbohydrates.   2. Leg edema -    3. Hyperlipidemia:   4. Obstructive sleep apnea: He wears a CPAP regularly.     5.  CP :  does not sound like angina.  More like like GERD , recommended weight loss .    Current medicines are reviewed at length with the patient today.  The patient does not have concerns regarding medicines.    Labs/ tests ordered today include:   Orders Placed This Encounter  Procedures   Lipid panel   ALT   Basic metabolic panel    Disposition:       Kristeen Miss, MD  11/24/2022 11:11 AM    [facility] [address] [place], [place], [place]  [address] Phone: [phone]; Fax: [phone]

## 2022-11-24 NOTE — Patient Instructions (Addendum)
Medication Instructions:  Your physician recommends that you continue on your current medications as directed. Please refer to the Current Medication list given to you today.  *If you need a refill on your cardiac medications before your next appointment, please call your pharmacy*   Lab Work: Lipids, ALT, BMET today If you have labs (blood work) drawn today and your tests are completely normal, you will receive your results only by: MyChart Message (if you have MyChart) OR A paper copy in the mail If you have any lab test that is abnormal or we need to change your treatment, we will call you to review the results.   Testing/Procedures: NONE   Follow-Up: At [facility], you and your health needs are our priority.  As part of our continuing mission to provide you with exceptional heart care, we have created designated Provider Care Teams.  These Care Teams include your primary Cardiologist (physician) and Advanced Practice Providers (APPs -  Physician Assistants and Nurse Practitioners) who all work together to provide you with the care you need, when you need it.  We recommend signing up for the patient portal called "MyChart".  Sign up information is provided on this After Visit Summary.  MyChart is used to connect with patients for Virtual Visits (Telemedicine).  Patients are able to view lab/test results, encounter notes, upcoming appointments, etc.  Non-urgent messages can be sent to your provider as well.   To learn more about what you can do with MyChart, go to [email or web address].    Your next appointment:   1 year(s)  The format for your next appointment:   In Person  Provider:   Kristeen Miss, MD       Important Information About Sugar       Adopting a Healthy Lifestyle.   Weight: Know what a healthy weight is for you (roughly BMI <25) and aim to maintain this. You can calculate your body mass index on your smart phone  Diet: Aim for 7+ servings of  fruits and vegetables daily Limit animal fats in diet for cholesterol and heart health - choose grass fed whenever available Avoid highly processed foods (fast food burgers, tacos, fried chicken, pizza, hot dogs, french fries)  Saturated fat comes in the form of butter, lard, coconut oil, margarine, partially hydrogenated oils, and fat in meat. These increase your risk of cardiovascular disease.  Use healthy plant oils, such as olive, canola, soy, corn, sunflower and peanut.  Whole foods such as fruits, vegetables and whole grains have fiber  Men need > 38 grams of fiber per day Women need > 25 grams of fiber per day  Load up on vegetables and fruits - one-half of your plate: Aim for color and variety, and remember that potatoes dont count. Go for whole grains - one-quarter of your plate: Whole wheat, barley, wheat berries, quinoa, oats, brown rice, and foods made with them. If you want pasta, go with whole wheat pasta. Protein power - one-quarter of your plate: Fish, chicken, beans, and nuts are all healthy, versatile protein sources. Limit red meat. You need carbohydrates for energy! The type of carbohydrate is more important than the amount. Choose carbohydrates such as vegetables, fruits, whole grains, beans, and nuts in the place of white rice, white pasta, potatoes (baked or fried), macaroni and cheese, cakes, cookies, and donuts.  If youre thirsty, drink water. Coffee and tea are good in moderation, but skip sugary drinks and limit milk and dairy products to one or  two daily servings. Keep sugar intake at 6 teaspoons or 24 grams or LESS       Exercise: Aim for 150 min of moderate intensity exercise weekly for heart health, and weights twice weekly for bone health Stay active - any steps are better than no steps! Aim for 7-9 hours of sleep daily      Avoid foods that are White, wheat, sweet  - white foods - limit your  rice, pasta, potatoes, corn.  if you do eat these, make sure its  brown rice, whole wheat pasta  - wheat - limit intake of all bread, biscuits, pizza dough.   The bread you do eat should be whole wheat bread.  - Sweets - limit cakes, cookies, desserts, ice cream, soda, sweet tea   Increase exercise

## 2022-11-25 ENCOUNTER — Encounter: Payer: Self-pay | Admitting: Cardiovascular Disease

## 2022-11-29 ENCOUNTER — Ambulatory Visit: Payer: Managed Care, Other (non HMO) | Admitting: Nurse Practitioner

## 2022-12-01 ENCOUNTER — Other Ambulatory Visit: Payer: Managed Care, Other (non HMO)

## 2022-12-02 ENCOUNTER — Ambulatory Visit: Payer: Managed Care, Other (non HMO)

## 2022-12-02 LAB — BASIC METABOLIC PANEL
BUN/Creatinine Ratio: 15 (ref 9–20)
BUN: 21 mg/dL (ref 6–24)
CO2: 21 mmol/L (ref 20–29)
Calcium: 9.3 mg/dL (ref 8.7–10.2)
Chloride: 102 mmol/L (ref 96–106)
Creatinine, Ser: 1.39 mg/dL — ABNORMAL HIGH (ref 0.76–1.27)
Glucose: 117 mg/dL — ABNORMAL HIGH (ref 70–99)
Potassium: 4.4 mmol/L (ref 3.5–5.2)
Sodium: 138 mmol/L (ref 134–144)
eGFR: 63 mL/min/{1.73_m2} (ref >59)

## 2022-12-02 LAB — LIPID PANEL
Chol/HDL Ratio: 3 ratio (ref 0.0–5.0)
Cholesterol, Total: 127 mg/dL (ref 100–199)
HDL: 43 mg/dL (ref >39)
LDL Chol Calc (NIH): 66 mg/dL (ref 0–99)
Triglycerides: 93 mg/dL (ref 0–149)
VLDL Cholesterol Cal: 18 mg/dL (ref 5–40)

## 2022-12-02 LAB — ALT: ALT: 17 IU/L (ref 0–44)

## 2022-12-16 ENCOUNTER — Other Ambulatory Visit: Payer: Self-pay | Admitting: Gastroenterology

## 2022-12-22 ENCOUNTER — Ambulatory Visit: Payer: Managed Care, Other (non HMO) | Admitting: Family Medicine

## 2023-01-05 ENCOUNTER — Other Ambulatory Visit: Payer: Self-pay | Admitting: Family Medicine

## 2023-01-05 NOTE — Telephone Encounter (Signed)
Rx refill request should be sent to PCP.

## 2023-01-05 NOTE — Progress Notes (Signed)
   I, [name], LAT, ATC acting as a scribe for Robert Leader, MD.  Robert Cruz is a 47 y.o. male who presents to [place] at [facility] today for continued left shoulder pain.  Patient was last seen by Dr. Georgina Snell on 10/26/2022 prior to going on a cruise and was given a left subacromial steroid injection.  Today, patient reports no relief from last steroid injection. Pt is wondering about surgery. Pt reports L shoulder pain has really worsened over the last 1.5 month. Pt c/o struggling w/ pain at night trying to sleep.   Dx imaging: 07/17/22 L shoulder MRI             04/29/22 L shoulder XR  Pertinent review of systems: No fevers or chills  Relevant historical information: Obesity.  Just started on Zepbound   Exam:  BP 130/84   Pulse 79   Ht 6\' 2"  (1.88 m)   Wt (!) 348 lb (157.9 kg)   SpO2 97%   BMI 44.68 kg/m  General: Well Developed, well nourished, and in no acute distress.   MSK: Left shoulder: Decreased range of motion pain with abduction intact strength.    Lab and Radiology Results  Procedure: Real-time Ultrasound Guided Injection of left shoulder glenohumeral joint posterior approach Device: Philips Affiniti 50G Images permanently stored and available for review in PACS Verbal informed consent obtained.  Discussed risks and benefits of procedure. Warned about infection, bleeding, hyperglycemia damage to structures among others. Patient expresses understanding and agreement Time-out conducted.   Noted no overlying erythema, induration, or other signs of local infection.   Skin prepped in a sterile fashion.   Local anesthesia: Topical Ethyl chloride.   With sterile technique and under real time ultrasound guidance: 40 mg of Kenalog and 2 mL of Marcaine injected into glenohumeral joint. Fluid seen entering the joint capsule.   Completed without difficulty   Pain moderately  resolved suggesting accurate placement of the medication.   Advised to  call if fevers/chills, erythema, induration, drainage, or persistent bleeding.   Images permanently stored and available for review in the ultrasound unit.  Impression: Technically successful ultrasound guided injection.         Assessment and Plan: 47 y.o. male with chronic left shoulder pain this is a recurrent problem.  He did have an MRI in 2023 that showed some rotator cuff tendinopathy and mild glenohumeral DJD.  He has not had great benefit recently from subacromial injection.  Will try glenohumeral injection and see if he gets better benefit.  If not would recommend surgical consultation.   PDMP not reviewed this encounter. Orders Placed This Encounter  Procedures   [place] LIMITED JOINT SPACE STRUCTURES UP RIGHT(NO LINKED CHARGES)    Order Specific Question:   Reason for Exam (SYMPTOM  OR DIAGNOSIS REQUIRED)    Answer:   right shoulder pain    Order Specific Question:   Preferred imaging location?    Answer:   [place]   No orders of the defined types were placed in this encounter.    Discussed warning signs or symptoms. Please see discharge instructions. Patient expresses understanding.   The above documentation has been reviewed and is accurate and complete Robert Cruz, M.D.

## 2023-01-06 ENCOUNTER — Other Ambulatory Visit: Payer: Self-pay | Admitting: Cardiovascular Disease

## 2023-01-06 ENCOUNTER — Ambulatory Visit: Payer: Managed Care, Other (non HMO) | Admitting: Family Medicine

## 2023-01-06 ENCOUNTER — Ambulatory Visit: Payer: Self-pay

## 2023-01-06 VITALS — BP 130/84 | HR 79 | Ht 74.0 in | Wt 348.0 lb

## 2023-01-06 DIAGNOSIS — M25512 Pain in left shoulder: Secondary | ICD-10-CM

## 2023-01-06 DIAGNOSIS — G8929 Other chronic pain: Secondary | ICD-10-CM | POA: Diagnosis not present

## 2023-01-06 NOTE — Patient Instructions (Addendum)
Thank you for coming in today.   You received an injection today. Seek immediate medical attention if the joint becomes red, extremely painful, or is oozing fluid.   Let me know if that shoulder does not feel better.

## 2023-01-25 ENCOUNTER — Ambulatory Visit: Payer: Managed Care, Other (non HMO) | Admitting: Family Medicine

## 2023-01-30 ENCOUNTER — Telehealth: Payer: Self-pay | Admitting: Gastroenterology

## 2023-01-30 NOTE — Telephone Encounter (Signed)
The pt has continued reflux despite zegerid and anti reflux precautions. He was last seen in May of 2023 with Alonza Bogus.  The pt has been scheduled to see Dr Loletha Carrow to discuss continued symptoms.  He will call if symptoms worsen.

## 2023-01-30 NOTE — Telephone Encounter (Signed)
Inbound call from patient stating that he has been taking Zegerid for awhile now and it has seemed to stop working. Patient is requesting a call back to discuss if there is an alternative medication he can take and to also see if there is anyway he can get a prescription for nausea. Please advise.

## 2023-02-02 ENCOUNTER — Other Ambulatory Visit: Payer: Self-pay | Admitting: Cardiovascular Disease

## 2023-02-02 ENCOUNTER — Other Ambulatory Visit: Payer: Self-pay | Admitting: Family Medicine

## 2023-02-02 ENCOUNTER — Ambulatory Visit: Payer: Managed Care, Other (non HMO) | Admitting: Gastroenterology

## 2023-02-17 ENCOUNTER — Ambulatory Visit
Admission: EM | Admit: 2023-02-17 | Discharge: 2023-02-17 | Disposition: A | Discharge status: Home / Self Care | Payer: Managed Care, Other (non HMO) | Attending: Urgent Care | Admitting: Urgent Care

## 2023-02-17 DIAGNOSIS — S29019A Strain of muscle and tendon of unspecified wall of thorax, initial encounter: Secondary | ICD-10-CM

## 2023-02-17 DIAGNOSIS — M546 Pain in thoracic spine: Secondary | ICD-10-CM

## 2023-02-17 LAB — POCT URINALYSIS DIP (MANUAL ENTRY)
Bilirubin, UA: NEGATIVE
Blood, UA: NEGATIVE
Glucose, UA: NEGATIVE mg/dL
Ketones, POC UA: NEGATIVE mg/dL
Leukocytes, UA: NEGATIVE
Nitrite, UA: NEGATIVE
Protein Ur, POC: NEGATIVE mg/dL
Spec Grav, UA: 1.02 (ref 1.010–1.025)
Urobilinogen, UA: 0.2 E.U./dL
pH, UA: 5.5 (ref 5.0–8.0)

## 2023-02-17 MED ORDER — TIZANIDINE HCL 4 MG PO TABS: 4.0000 mg | ORAL_TABLET | Freq: Every day | ORAL | 0 refills | Status: DC

## 2023-02-17 MED ORDER — NAPROXEN 500 MG PO TABS: 500.0000 mg | ORAL_TABLET | Freq: Two times a day (BID) | ORAL | 0 refills | Status: DC

## 2023-02-17 MED ORDER — ACETAMINOPHEN 325 MG PO TABS: 650.0000 mg | ORAL_TABLET | Freq: Four times a day (QID) | ORAL | 0 refills | Status: DC | PRN

## 2023-02-17 NOTE — ED Triage Notes (Signed)
Pt c/o left flank pain started this am-NAD-steady gait

## 2023-02-17 NOTE — ED Provider Notes (Signed)
[facility]  Note:  This document was prepared using Systems analyst and may include unintentional dictation errors.  MRN: [id number] DOB: 10-15-1976  Subjective:   Robert Cruz is a 47 y.o. male presenting for acute onset this morning of left-sided thoracic back pain worse with bending, rotating.  Patient reports that he thinks he hurt himself at work.  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  No fever, nausea, vomiting, constipation, diarrhea, hematuria, dysuria, rash.  Has a history of abnormal creatinine levels, stage IIIa chronic kidney disease.  Reports that he can take NSAIDs.  Does not hydrate as well as he should.  No current facility-administered medications for this encounter.  Current Outpatient Medications:    allopurinol (ZYLOPRIM) 100 MG tablet, TAKE 1 TABLET BY MOUTH EVERY DAY, Disp: 90 tablet, Rfl: 3   alum hydroxide-mag trisilicate (GAVISCON) [id number] MG CHEW chewable tablet, Chew 2 tablets by mouth 4 (four) times daily as needed for indigestion or heartburn., Disp: , Rfl:    atenolol (TENORMIN) 100 MG tablet, TAKE 1 TABLET BY MOUTH EVERY DAY, Disp: 90 tablet, Rfl: 2   atorvastatin (LIPITOR) 40 MG tablet, TAKE 1 TABLET BY MOUTH EVERY DAY, Disp: 90 tablet, Rfl: 3   diclofenac (VOLTAREN) 75 MG EC tablet, TAKE 1 TABLET BY MOUTH 2 TIMES DAILY AS NEEDED., Disp: 60 tablet, Rfl: 0   hydrochlorothiazide (HYDRODIURIL) 25 MG tablet, TAKE 1 TABLET (25 MG TOTAL) BY MOUTH DAILY., Disp: 90 tablet, Rfl: 2   lisinopril (ZESTRIL) 20 MG tablet, TAKE 1 TABLET BY MOUTH EVERY DAY, Disp: 90 tablet, Rfl: 3   nitroGLYCERIN (NITRODUR - DOSED IN MG/24 HR) 0.2 mg/hr patch, Apply 1/2 patch daily to tendon for tendonitis., Disp: 30 patch, Rfl: 1   omeprazole-sodium bicarbonate (ZEGERID) 40-1100 MG capsule, TAKE 1 CAPSULE BY MOUTH ONCE DAILY BEFORE BREAKFAST, Disp: 30 capsule, Rfl: 5   spironolactone  (ALDACTONE) 100 MG tablet, TAKE 1 TABLET BY MOUTH EVERY DAY, Disp: 90 tablet, Rfl: 2   tirzepatide (ZEPBOUND) 2.5 MG/0.5ML Pen, Inject 2.5 mg into the skin once a week., Disp: , Rfl:    Allergies  Allergen Reactions   Amlodipine Other (See Comments)    edema   Bystolic [Nebivolol Hcl] Other (See Comments)    Patient stated medication gave him major headaches    Past Medical History:  Diagnosis Date   Chicken pox    GERD (gastroesophageal reflux disease)    Gout 2022   Hypertension    Morbid obesity ([place])    Sleep apnea      History reviewed. No pertinent surgical history.  Family History  Problem Relation Age of Onset   Hypertension Father    Depression Father    Hypertension Mother    Kidney disease Maternal Grandmother        Dialysis   Hypertension Maternal Grandmother    Healthy Brother        x1   Healthy Sister        x3   Colon cancer Neg Hx    Esophageal cancer Neg Hx    Rectal cancer Neg Hx    Stomach cancer Neg Hx     Social History   Tobacco Use   Smoking status: Some Days    Types: Cigars   Smokeless tobacco: Never  Vaping Use   Vaping Use: Never used  Substance Use Topics   Alcohol use: No  Drug use: No    ROS   Objective:   Vitals: BP 111/74 (BP Location: Right Arm)   Pulse 73   Temp 98 F (36.7 C) (Oral)   Resp 18   SpO2 97%   Physical Exam Constitutional:      General: He is not in acute distress.    Appearance: Normal appearance. He is well-developed and normal weight. He is not ill-appearing, toxic-appearing or diaphoretic.  HENT:     Head: Normocephalic and atraumatic.     Right Ear: External ear normal.     Left Ear: External ear normal.     Nose: Nose normal.     Mouth/Throat:     Pharynx: Oropharynx is clear.  Eyes:     General: No scleral icterus.       Right eye: No discharge.        Left eye: No discharge.     Extraocular Movements: Extraocular movements intact.  Cardiovascular:     Rate and Rhythm: Normal  rate.  Pulmonary:     Effort: Pulmonary effort is normal.  Abdominal:     General: Bowel sounds are normal. There is no distension.     Palpations: Abdomen is soft. There is no mass.     Tenderness: There is no abdominal tenderness. There is no right CVA tenderness, left CVA tenderness, guarding or rebound.  Musculoskeletal:     Cervical back: Normal range of motion.     Thoracic back: Spasms and tenderness (over area outlined) present. No swelling, edema, deformity, signs of trauma, lacerations or bony tenderness. Decreased range of motion. No scoliosis.     Lumbar back: No swelling, edema, deformity, signs of trauma, lacerations, spasms, tenderness or bony tenderness. Normal range of motion. Negative right straight leg raise test and negative left straight leg raise test. No scoliosis.       Back:  Neurological:     Mental Status: He is alert and oriented to person, place, and time.  Psychiatric:        Mood and Affect: Mood normal.        Behavior: Behavior normal.        Thought Content: Thought content normal.        Judgment: Judgment normal.     Results for orders placed or performed during the hospital encounter of 02/17/23 (from the past 24 hour(s))  POCT urinalysis dipstick     Status: None   Collection Time: 02/17/23  4:39 PM  Result Value Ref Range   Color, UA yellow yellow   Clarity, UA clear clear   Glucose, UA negative negative mg/dL   Bilirubin, UA negative negative   Ketones, POC UA negative negative mg/dL   Spec Grav, UA 1.020 1.010 - 1.025   Blood, UA negative negative   pH, UA 5.5 5.0 - 8.0   Protein Ur, POC negative negative mg/dL   Urobilinogen, UA 0.2 0.2 or 1.0 E.U./dL   Nitrite, UA Negative Negative   Leukocytes, UA Negative Negative    Assessment and Plan :   PDMP not reviewed this encounter.  1. Acute left-sided thoracic back pain   2. Strain of thoracic region, initial encounter     Creatinine clearance calculated at 136 mL/min based off of  the last creatinine level.  Will manage conservatively for back strain with NSAID and muscle relaxant, rest and modification of physical activity.  Anticipatory guidance provided.  Counseled patient on potential for adverse effects with medications prescribed/recommended today, ER and return-to-clinic  precautions discussed, patient verbalized understanding.    Jaynee Eagles, [place] 02/17/23 1757

## 2023-03-04 ENCOUNTER — Other Ambulatory Visit: Payer: Self-pay | Admitting: Family Medicine

## 2023-03-20 ENCOUNTER — Encounter: Payer: Self-pay | Admitting: *Deleted

## 2023-03-21 NOTE — Progress Notes (Unsigned)
   Robert Payor, PhD, LAT, ATC acting as a scribe for Robert Graham, MD.  Robert Cruz is a 47 y.o. male who presents to [facility] today for cont'd L shoulder pain. Pt was last seen by Dr. Denyse Cruz on 2//24 and was given a L GH steroid injection and advised if not better, would recommend surgical consultation. He was given a L subacromial steroid injection on 10/26/22 that provided no pain relief.   Today, pt reports he got more relief from the [facility] injection, pain relief was not long lasting. Pt only got about 1 month of pain relief.   Dx imaging: 07/17/22 L shoulder MRI             04/29/22 L shoulder XR  Pertinent review of systems: No fevers or chills.  Relevant historical information: Migraine history Controlled gout  Exam:  BP 102/74   Pulse 87   Ht  (1.88 m)   Wt (!) 317 lb (143.8 kg)   SpO2 95%   BMI 40.70 kg/m  General: Well Developed, well nourished, and in no acute distress.   MSK: Left shoulder: Normal-appearing normal motion, pain with abduction    Lab and Radiology Results  Procedure: Real-time Ultrasound Guided Injection of left shoulder glenohumeral joint posterior approach Device: Philips Affiniti 50G Images permanently stored and available for review in PACS Verbal informed consent obtained.  Discussed risks and benefits of procedure. Warned about infection, bleeding, hyperglycemia damage to structures among others. Patient expresses understanding and agreement Time-out conducted.   Noted no overlying erythema, induration, or other signs of local infection.   Skin prepped in a sterile fashion.   Local anesthesia: Topical Ethyl chloride.   With sterile technique and under real time ultrasound guidance: 40 mg of Kenalog and 2 mL Marcaine injected into the glenohumeral joint. Fluid seen entering the joint capsule.   Completed without difficulty   Pain immediately resolved suggesting accurate placement of the medication.    Advised to call if fevers/chills, erythema, induration, drainage, or persistent bleeding.   Images permanently stored and available for review in the ultrasound unit.  Impression: Technically successful ultrasound guided injection.  Left shoulder MRI dated July 18, 2022 IMPRESSION: 1. Mild distal supraspinatus tendinosis with small focal full-thickness tear anteriorly at the insertion. 2. Mild distal infraspinatus tendinosis. 3. Mild acromioclavicular and glenohumeral osteoarthritis.     Electronically Signed   By: Robert Cruz M.D.   On: 07/18/2022 17:23    Assessment and Plan: 47 y.o. male with chronic left shoulder pain.  Glenohumeral injection has been marginally successful performed about 3 months ago.  Plan for repeat injection.  However the shots are not lasting.  Plan to refer to orthopedic surgery for surgical consultation. Repeat injection today prior to referral.  PDMP not reviewed this encounter. Orders Placed This Encounter  Procedures   [place] LIMITED JOINT SPACE STRUCTURES UP LEFT(NO LINKED CHARGES)    Order Specific Question:   Reason for Exam (SYMPTOM  OR DIAGNOSIS REQUIRED)    Answer:   left shoulder pain    Order Specific Question:   Preferred imaging location?    Answer:   [facility]   No orders of the defined types were placed in this encounter.    Discussed warning signs or symptoms. Please see discharge instructions. Patient expresses understanding.   The above documentation has been reviewed and is accurate and complete Robert Cruz, M.D.

## 2023-03-22 ENCOUNTER — Ambulatory Visit (INDEPENDENT_AMBULATORY_CARE_PROVIDER_SITE_OTHER): Payer: Managed Care, Other (non HMO) | Admitting: Gastroenterology

## 2023-03-22 ENCOUNTER — Ambulatory Visit: Payer: Self-pay

## 2023-03-22 ENCOUNTER — Encounter: Payer: Self-pay | Admitting: Gastroenterology

## 2023-03-22 ENCOUNTER — Ambulatory Visit (INDEPENDENT_AMBULATORY_CARE_PROVIDER_SITE_OTHER): Payer: Managed Care, Other (non HMO) | Admitting: Family Medicine

## 2023-03-22 VITALS — BP 102/74 | HR 87 | Ht 74.0 in | Wt 317.0 lb

## 2023-03-22 VITALS — BP 118/72 | HR 95 | Ht 74.0 in | Wt 319.0 lb

## 2023-03-22 DIAGNOSIS — G8929 Other chronic pain: Secondary | ICD-10-CM

## 2023-03-22 DIAGNOSIS — K449 Diaphragmatic hernia without obstruction or gangrene: Secondary | ICD-10-CM

## 2023-03-22 DIAGNOSIS — M25512 Pain in left shoulder: Secondary | ICD-10-CM

## 2023-03-22 DIAGNOSIS — Z1211 Encounter for screening for malignant neoplasm of colon: Secondary | ICD-10-CM | POA: Diagnosis not present

## 2023-03-22 DIAGNOSIS — K219 Gastro-esophageal reflux disease without esophagitis: Secondary | ICD-10-CM | POA: Diagnosis not present

## 2023-03-22 MED ORDER — NA SULFATE-K SULFATE-MG SULF 17.5-3.13-1.6 GM/177ML PO SOLN: 1.0000 | Freq: Once | ORAL | 0 refills | Status: AC

## 2023-03-22 NOTE — Patient Instructions (Signed)
_______________________________________________________  If your blood pressure at your visit was 140/90 or greater, please contact your primary care physician to follow up on this.  _______________________________________________________  If you are age 47 or older, your body mass index should be between 23-30. Your Body mass index is 40.96 kg/m. If this is out of the aforementioned range listed, please consider follow up with your Primary Care Provider.  If you are age 80 or younger, your body mass index should be between 19-25. Your Body mass index is 40.96 kg/m. If this is out of the aformentioned range listed, please consider follow up with your Primary Care Provider.   ________________________________________________________  The [facility] providers would like to encourage you to use [facility] to communicate with providers for non-urgent requests or questions.  Due to long hold times on the telephone, sending your provider a message by [facility] may be a faster and more efficient way to get a response.  Please allow 48 business hours for a response.  Please remember that this is for non-urgent requests.  _______________________________________________________  Bonita Quin have been scheduled for an endoscopy and colonoscopy. Please follow the written instructions given to you at your visit today. Please pick up your prep supplies at the pharmacy within the next 1-3 days. If you use inhalers (even only as needed), please bring them with you on the day of your procedure.  Due to recent changes in healthcare laws, you may see the results of your imaging and laboratory studies on MyChart before your provider has had a chance to review them.  We understand that in some cases there may be results that are confusing or concerning to you. Not all laboratory results come back in the same time frame and the provider may be waiting for multiple results in order to interpret others.  Please give [place] 48 hours in  order for your provider to thoroughly review all the results before contacting the office for clarification of your results.   It was a pleasure to see you today!  Thank you for trusting me with your gastrointestinal care!

## 2023-03-22 NOTE — Progress Notes (Deleted)
[facility] Progress Note  Chief Complaint: ***  Subjective  History: From last office visit here with APP May 2023: "*GERD: Severe symptoms.  Over the years he has tried almost all PPIs and Pepcid.  Says that Tums do not work.  The only thing that helps is Gaviscon and even that is not working recently.  He has tried to make some dietary changes and will continue to do so.  Last EGD was almost 3 years ago.  He had a 2 cm hiatal hernia at that point.  We are going to try Zegerid if we can get it approved.  Maybe the sodium bicarbonate component will help him.  We will plan for repeat EGD as well.  Question if he would be candidate for TIF? *CRC screening: Never had a colonoscopy in the past.  Was scheduled with Dr. Myrtie Neither earlier this year, but had to cancel.  We will reschedule."  No procedures done.  Patient called office 2 months ago complaining of persistent reflux symptoms despite current acid suppression therapy. ***  ROS: Cardiovascular:  no chest pain Respiratory: no dyspnea  The patient's Past Medical, Family and Social History were reviewed and are on file in the EMR.  Objective:  Med list reviewed  Current Outpatient Medications:    allopurinol (ZYLOPRIM) 100 MG tablet, TAKE 1 TABLET BY MOUTH EVERY DAY, Disp: 90 tablet, Rfl: 3   alum hydroxide-mag trisilicate (GAVISCON) 80-20 MG CHEW chewable tablet, Chew 2 tablets by mouth 4 (four) times daily as needed for indigestion or heartburn., Disp: , Rfl:    atenolol (TENORMIN) 100 MG tablet, TAKE 1 TABLET BY MOUTH EVERY DAY, Disp: 90 tablet, Rfl: 2   atorvastatin (LIPITOR) 40 MG tablet, TAKE 1 TABLET BY MOUTH EVERY DAY, Disp: 90 tablet, Rfl: 3   diclofenac (VOLTAREN) 75 MG EC tablet, TAKE 1 TABLET BY MOUTH TWICE A DAY AS NEEDED (Patient not taking: Reported on 03/22/2023), Disp: 60 tablet, Rfl: 0   hydrochlorothiazide (HYDRODIURIL) 25 MG tablet, TAKE 1 TABLET (25 MG TOTAL) BY MOUTH DAILY., Disp: 90 tablet, Rfl: 2    lisinopril (ZESTRIL) 20 MG tablet, TAKE 1 TABLET BY MOUTH EVERY DAY, Disp: 90 tablet, Rfl: 3   naproxen (NAPROSYN) 500 MG tablet, Take 1 tablet (500 mg total) by mouth 2 (two) times daily with a meal., Disp: 30 tablet, Rfl: 0   omeprazole-sodium bicarbonate (ZEGERID) 40-1100 MG capsule, TAKE 1 CAPSULE BY MOUTH ONCE DAILY BEFORE BREAKFAST, Disp: 30 capsule, Rfl: 5   spironolactone (ALDACTONE) 100 MG tablet, TAKE 1 TABLET BY MOUTH EVERY DAY, Disp: 90 tablet, Rfl: 2   tirzepatide (MOUNJARO) 7.5 MG/0.5ML Pen, Inject 7.5 mg into the skin once a week., Disp: , Rfl:    tiZANidine (ZANAFLEX) 4 MG tablet, Take 1 tablet (4 mg total) by mouth at bedtime., Disp: 30 tablet, Rfl: 0   Vital signs in last 24 hrs: There were no vitals filed for this visit. Wt Readings from Last 3 Encounters:  03/22/23 (!) 317 lb (143.8 kg)  01/06/23 (!) 348 lb (157.9 kg)  11/24/22 (!) 344 lb (156 kg)    Physical Exam  *** HEENT: sclera anicteric, oral mucosa moist without lesions Neck: supple, no thyromegaly, JVD or lymphadenopathy Cardiac: ***,  no peripheral edema Pulm: clear to auscultation bilaterally, normal RR and effort noted Abdomen: soft, *** tenderness, with active bowel sounds. No guarding or palpable hepatosplenomegaly. Skin; warm and dry, no jaundice or rash  Labs:   ___________________________________________ Radiologic studies:  ____________________________________________ Other:   _____________________________________________ Assessment & Plan  Assessment: No diagnosis found.    Plan:   *** minutes were spent on this encounter (including chart review, history/exam, counseling/coordination of care, and documentation) > 50% of that time was spent on counseling and coordination of care.   Robert Cruz

## 2023-03-22 NOTE — Progress Notes (Signed)
[facility] Progress Note  Chief Complaint:  Chief Complaint  Patient presents with   Gastroesophageal Reflux    Taking Zegrid and Gaviscon regularly, patient reports it is worse at night   Colonoscopy    Discuss scheduling     Subjective  History: From last office visit here with APP May 2023: "*GERD: Severe symptoms.  Over the years he has tried almost all PPIs and Pepcid.  Says that Tums do not work.  The only thing that helps is Gaviscon and even that is not working recently.  He has tried to make some dietary changes and will continue to do so.  Last EGD was almost 3 years ago.  He had a 2 cm hiatal hernia at that point.  We are going to try Zegerid if we can get it approved.  Maybe the sodium bicarbonate component will help him.  We will plan for repeat EGD as well.  Question if he would be candidate for TIF? *CRC screening: Never had a colonoscopy in the past.  Was scheduled with Dr. Myrtie Neither earlier this year, but had to cancel.  We will reschedule." No procedures done.  Patient called office 2 months ago complaining of persistent reflux symptoms despite current acid suppression therapy.  Patient is accompanied by his wife for today's visit.  Today, he continues to complains of reflux that is worsened at night and when laying down. He also reports currently taking  Zegerid and Gaviscon regularly and typically in the evening.  He recalls that he wasn't compliant with Zegerid 40-1100 mg daily after his EGD in 2020. However, he states he's been taking it regularly for the past year or so and has seen improvements with it. He states that after starting Mounjaro 7.5 mg weekly in February 2024 for weight loss, his antacids medications has not been as effective.   He also complains of nausea but attributes it to the use of Mounjaro which he typically injects in Fridays .  He denies diarrhea, blood in stool, black stool, vomiting, abdominal pain, bloating, unintentional weight  loss,or dysphagia.  ROS: Review of Systems  Constitutional:  Negative for appetite change and fever.  HENT:  Negative for trouble swallowing.   Respiratory:  Negative for cough and shortness of breath.   Cardiovascular:  Negative for chest pain.  Gastrointestinal:  Positive for nausea. Negative for abdominal distention, abdominal pain, anal bleeding, blood in stool, constipation, diarrhea, rectal pain and vomiting.       +reflux/heartburn  Genitourinary:  Negative for dysuria.  Musculoskeletal:  Negative for back pain.  Skin:  Negative for rash.  Neurological:  Negative for weakness.  All other systems reviewed and are negative.  The patient's Past Medical, Family and Social History were reviewed and are on file in the EMR. Past Medical History:  Diagnosis Date   Chicken pox    GERD (gastroesophageal reflux disease)    Gout 2022   Hypertension    Morbid obesity    Sleep apnea     Objective:  Med list reviewed  Current Outpatient Medications:    allopurinol (ZYLOPRIM) 100 MG tablet, TAKE 1 TABLET BY MOUTH EVERY DAY, Disp: 90 tablet, Rfl: 3   alum hydroxide-mag trisilicate (GAVISCON) 80-20 MG CHEW chewable tablet, Chew 2 tablets by mouth 4 (four) times daily as needed for indigestion or heartburn., Disp: , Rfl:    atenolol (TENORMIN) 100 MG tablet, TAKE 1 TABLET BY MOUTH EVERY DAY, Disp: 90 tablet, Rfl: 2   atorvastatin (  LIPITOR) 40 MG tablet, TAKE 1 TABLET BY MOUTH EVERY DAY, Disp: 90 tablet, Rfl: 3   hydrochlorothiazide (HYDRODIURIL) 25 MG tablet, TAKE 1 TABLET (25 MG TOTAL) BY MOUTH DAILY., Disp: 90 tablet, Rfl: 2   lisinopril (ZESTRIL) 20 MG tablet, TAKE 1 TABLET BY MOUTH EVERY DAY, Disp: 90 tablet, Rfl: 3   Na Sulfate-K Sulfate-Mg Sulf 17.5-3.13-1.6 GM/177ML SOLN, Take 1 kit by mouth once for 1 dose., Disp: 354 mL, Rfl: 0   omeprazole-sodium bicarbonate (ZEGERID) 40-1100 MG capsule, TAKE 1 CAPSULE BY MOUTH ONCE DAILY BEFORE BREAKFAST, Disp: 30 capsule, Rfl: 5    spironolactone (ALDACTONE) 100 MG tablet, TAKE 1 TABLET BY MOUTH EVERY DAY, Disp: 90 tablet, Rfl: 2   tirzepatide (MOUNJARO) 7.5 MG/0.5ML Pen, Inject 7.5 mg into the skin once a week., Disp: , Rfl:    Vital signs in last 24 hrs: Vitals:   03/22/23 1357  BP: 118/72  Pulse: 95  SpO2: 96%   Wt Readings from Last 3 Encounters:  03/22/23 (!) 319 lb (144.7 kg)  03/22/23 (!) 317 lb (143.8 kg)  01/06/23 (!) 348 lb (157.9 kg)  His wife present for the entire visit. Physical Exam General: well-appearing   Eyes: sclera anicteric, no redness ENT: oral mucosa moist without lesions, no cervical or supraclavicular lymphadenopathy CV: RRR, no JVD, no peripheral edema Resp: clear to auscultation bilaterally, normal RR and effort noted GI: soft, no tenderness, with active bowel sounds. No guarding or palpable organomegaly noted. Skin; warm and dry, no rash or jaundice noted Neuro: awake, alert and oriented x 3. Normal gross motor function and fluent speech   Labs:    Latest Ref Rng & Units 09/04/2020    5:04 PM 09/08/2019    8:51 PM 07/13/2018    9:31 AM  CBC  WBC 4.0 - 10.5 K/uL 9.5  7.5  7.7   Hemoglobin 13.0 - 17.0 g/dL 16.1  09.6  04.5   Hematocrit 39.0 - 52.0 % 37.1  35.7  35.5   Platelets 150.0 - 400.0 K/uL 299.0  380  337.0       Latest Ref Rng & Units 12/02/2022    7:37 AM 03/29/2022    4:26 PM 12/31/2021    4:36 PM  CMP  Glucose 70 - 99 mg/dL 409  811  914   BUN 6 - 24 mg/dL Creatinine 0.76 - 1.27 mg/dL 7.82  9.56  2.13   Sodium 134 - 144 mmol/L 138  134  136   Potassium 3.5 - 5.2 mmol/L 4.4  4.0  4.8   Chloride 96 - 106 mmol/L 102  102  102   CO2 20 - 29 mmol/L Calcium 8.7 - 10.2 mg/dL 9.3  9.1  9.3   ALT 0 - 44 IU/L 17   15    ___________________________________________ Radiologic studies:   ____________________________________________ Other:   _____________________________________________ Assessment & Plan  Assessment: Encounter  Diagnoses  Name Primary?   Gastroesophageal reflux disease without esophagitis Yes   Hiatal hernia    Colon cancer screening       Plan: Will schedule a Colonoscopy for colorectal cancer screening and an upper endoscopy to evaluate reflux.  (Assess for any esophagitis and the current status of hiatal hernia) Advised on dietary and lifestyle changes to help reduce reflux symptoms.  Most important for his nocturnal symptoms would be elevating head of bed and try for 6 to 8 hours between last  food of the day and laying down for bed.  And intermittent fasting type of schedule would probably work well for him if he is able to do it. Continue Zegerid and Gaviscon at current doses  Advised to use OTC liquid antacids PRN for nightly symptoms of reflux      Charlie Pitter III   30 minutes were spent on this encounter (including chart review, history/exam, counseling/coordination of care, and documentation) > 50% of that time was spent on counseling and coordination of care.   I,Safa M Kadhim,acting as a scribe for Charlie Pitter III, MD.,have documented all relevant documentation on the behalf of Sherrilyn Rist, MD,as directed by  Sherrilyn Rist, MD while in the presence of Sherrilyn Rist, MD.   Marvis Repress III, MD, have reviewed all documentation for this visit. The documentation on 03/22/23 for the exam, diagnosis, procedures, and orders are all accurate and complete.

## 2023-03-22 NOTE — Patient Instructions (Addendum)
Thank you for coming in today.   You received an injection today. Seek immediate medical attention if the joint becomes red, extremely painful, or is oozing fluid.  

## 2023-03-24 ENCOUNTER — Ambulatory Visit: Payer: Managed Care, Other (non HMO) | Admitting: Family Medicine

## 2023-04-12 ENCOUNTER — Other Ambulatory Visit (HOSPITAL_BASED_OUTPATIENT_CLINIC_OR_DEPARTMENT_OTHER): Payer: Self-pay

## 2023-04-12 ENCOUNTER — Ambulatory Visit (HOSPITAL_BASED_OUTPATIENT_CLINIC_OR_DEPARTMENT_OTHER): Payer: Self-pay | Admitting: Orthopaedic Surgery

## 2023-04-12 ENCOUNTER — Ambulatory Visit (HOSPITAL_BASED_OUTPATIENT_CLINIC_OR_DEPARTMENT_OTHER): Payer: Managed Care, Other (non HMO) | Admitting: Orthopaedic Surgery

## 2023-04-12 ENCOUNTER — Telehealth: Payer: Self-pay | Admitting: Gastroenterology

## 2023-04-12 DIAGNOSIS — S46011A Strain of muscle(s) and tendon(s) of the rotator cuff of right shoulder, initial encounter: Secondary | ICD-10-CM | POA: Insufficient documentation

## 2023-04-12 DIAGNOSIS — S46012A Strain of muscle(s) and tendon(s) of the rotator cuff of left shoulder, initial encounter: Secondary | ICD-10-CM | POA: Diagnosis not present

## 2023-04-12 MED ORDER — OXYCODONE HCL 5 MG PO CAPS: 5.0000 mg | ORAL_CAPSULE | ORAL | 0 refills | Status: DC | PRN

## 2023-04-12 MED ORDER — ACETAMINOPHEN 500 MG PO TABS: 500.0000 mg | ORAL_TABLET | Freq: Three times a day (TID) | ORAL | 0 refills | Status: AC

## 2023-04-12 MED ORDER — IBUPROFEN 800 MG PO TABS: 800.0000 mg | ORAL_TABLET | Freq: Three times a day (TID) | ORAL | 0 refills | Status: AC

## 2023-04-12 MED ORDER — ASPIRIN 325 MG PO TBEC: 325.0000 mg | DELAYED_RELEASE_TABLET | Freq: Every day | ORAL | 0 refills | Status: DC

## 2023-04-12 NOTE — Telephone Encounter (Signed)
Patient contacted the on call provider this evening because he has a colonoscopy scheduled for Friday and is having a gout flare and was wondering if he could take his diclofenac.  He states he was advised to not take it in the days prior to his colonoscopy. I told him that if he is having a gout flare, the benefits of taking the diclofenac likely outweigh any potential bleeding risks or renal toxicity risks for his upcoming colonoscopy.  The bleeding risks with diclofenac are very small.   He also took a dose of prednisone (20mg ) which is also okay for his colonoscopy. Will pass along to his primary GI, Dr. Myrtie Neither tomorrow

## 2023-04-12 NOTE — Progress Notes (Signed)
Chief Complaint: Left shoulder pain     History of Present Illness:    Robert Cruz is a 47 y.o. male presents today with ongoing left shoulder pain that he believes may have been a result of an acute injury at work over 1 year prior.  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  He has been seeing Dr. [name] who has been offering and performing ultrasound-guided injection of the shoulder.  These initially gave him great relief although these have subsequently been wearing off.  He has had approximately 6 in total.  He is very active.  He is right-hand dominant.  He does use his arms for laboring at his job.  At this time he does continue to go to the gym and attempt to work all although this has been quite limited by her shoulder pain and weakness.    Surgical History:   None  PMH/PSH/Family History/Social History/Meds/Allergies:    Past Medical History:  Diagnosis Date   Chicken pox    GERD (gastroesophageal reflux disease)    Gout 2022   Hypertension    Morbid obesity (HCC)    Sleep apnea    No past surgical history on file. Social History   Socioeconomic History   Marital status: Married    Spouse name: Not on file   Number of children: 5   Years of education: 13   Highest education level: Not on file  Occupational History   Occupation: Information systems manager: [employer]  Tobacco Use   Smoking status: Some Days    Types: Cigars   Smokeless tobacco: Never  Vaping Use   Vaping Use: Never used  Substance and Sexual Activity   Alcohol use: Yes    Comment: seldom   Drug use: No   Sexual activity: Not on file  Other Topics Concern   Not on file  Social History Narrative   Right handed   Two Story Home    Lives with wife and family   Drinks Caffeine everyday   Social Determinants of Health   Financial Resource Strain: Not on file  Food  Insecurity: Not on file  Transportation Needs: Not on file  Physical Activity: Not on file  Stress: Not on file  Social Connections: Not on file   Family History  Problem Relation Age of Onset   Hypertension Father    Depression Father    Hypertension Mother    Kidney disease Maternal Grandmother        Dialysis   Hypertension Maternal Grandmother    Healthy Brother        x1   Healthy Sister        x3   Colon cancer Neg Hx    Esophageal cancer Neg Hx    Rectal cancer Neg Hx    Stomach cancer Neg Hx    Allergies  Allergen Reactions   Amlodipine Other (See Comments)    edema   Bystolic [Nebivolol Hcl] Other (See Comments)    Patient stated medication gave him major headaches   Current Outpatient Medications  Medication Sig Dispense Refill   acetaminophen (TYLENOL) 500 MG tablet Take 1 tablet (500 mg total) by mouth every 8 (eight) hours  for 10 days. 30 tablet 0   aspirin EC 325 MG tablet Take 1 tablet (325 mg total) by mouth daily. 30 tablet 0   ibuprofen (ADVIL) 800 MG tablet Take 1 tablet (800 mg total) by mouth every 8 (eight) hours for 10 days. Please take with food, please alternate with acetaminophen 30 tablet 0   oxycodone (OXY-IR) 5 MG capsule Take 1 capsule (5 mg total) by mouth every 4 (four) hours as needed (severe pain). 10 capsule 0   allopurinol (ZYLOPRIM) 100 MG tablet TAKE 1 TABLET BY MOUTH EVERY DAY 90 tablet 3   alum hydroxide-mag trisilicate (GAVISCON) 80-20 MG CHEW chewable tablet Chew 2 tablets by mouth 4 (four) times daily as needed for indigestion or heartburn.     atenolol (TENORMIN) 100 MG tablet TAKE 1 TABLET BY MOUTH EVERY DAY 90 tablet 2   atorvastatin (LIPITOR) 40 MG tablet TAKE 1 TABLET BY MOUTH EVERY DAY 90 tablet 3   hydrochlorothiazide (HYDRODIURIL) 25 MG tablet TAKE 1 TABLET (25 MG TOTAL) BY MOUTH DAILY. 90 tablet 2   lisinopril (ZESTRIL) 20 MG tablet TAKE 1 TABLET BY MOUTH EVERY DAY 90 tablet 3   omeprazole-sodium bicarbonate (ZEGERID)  40-1100 MG capsule TAKE 1 CAPSULE BY MOUTH ONCE DAILY BEFORE BREAKFAST 30 capsule 5   spironolactone (ALDACTONE) 100 MG tablet TAKE 1 TABLET BY MOUTH EVERY DAY 90 tablet 2   tirzepatide (MOUNJARO) 7.5 MG/0.5ML Pen Inject 7.5 mg into the skin once a week.     No current facility-administered medications for this visit.   No results found.  Review of Systems:   A ROS was performed including pertinent positives and negatives as documented in the HPI.  Physical Exam :   Constitutional: NAD and appears stated age Neurological: Alert and oriented Psych: Appropriate affect and cooperative There were no vitals taken for this visit.   Comprehensive Musculoskeletal Exam:    Musculoskeletal Exam    Inspection Right Left  Skin No atrophy or winging No atrophy or winging  Palpation    Tenderness None Lateral deltoid  Range of Motion    Flexion (passive) 170 170  Flexion (active) 170 170  Abduction 170 170  ER at the side 70 70  Can reach behind back to T12 L3  Strength     Full Weakness with forward elevation, negative belly press  Special Tests    Pseudoparalytic No No  Neurologic    Fires PIN, radial, median, ulnar, musculocutaneous, axillary, suprascapular, long thoracic, and spinal accessory innervated muscles. No abnormal sensibility  Vascular/Lymphatic    Radial Pulse 2+ 2+  Cervical Exam    Patient has symmetric cervical range of motion with negative Spurling's test.  Special Test: Positive Neer impingement     Imaging:   Xray (3 views left shoulder): Normal  MRI (left shoulder): Small full-thickness tear at the anterior border of the supraspinatus  I personally reviewed and interpreted the radiographs.   Assessment:   47 y.o. male with a left shoulder full-thickness tear at the anterior border of the supraspinatus.  I did discuss that overall he has had multiple injections at this time.  I do not believe that these will continue to provide longer relief as his body  does appear to be acclimating to this.  We did discuss the role for physical therapy versus fixation of his rotator cuff.  Given the fact that he has trialed home strengthening program I do believe that he has optimize his physical strength status but continues to  be symptomatic.  That effect we did discuss the role of left shoulder arthroscopic rotator cuff repair.  After long discussion he and her discussion of the risks and benefits he has elected for this.  Plan :    -Plan for left shoulder arthroscopy with rotator cuff repair   After a lengthy discussion of treatment options, including risks, benefits, alternatives, complications of surgical and nonsurgical conservative options, the patient elected surgical repair.   The patient  is aware of the material risks  and complications including, but not limited to injury to adjacent structures, neurovascular injury, infection, numbness, bleeding, implant failure, thermal burns, stiffness, persistent pain, failure to heal, disease transmission from allograft, need for further surgery, dislocation, anesthetic risks, blood clots, risks of death,and others. The probabilities of surgical success and failure discussed with patient given their particular co-morbidities.The time and nature of expected rehabilitation and recovery was discussed.The patient's questions were all answered preoperatively.  No barriers to understanding were noted. I explained the natural history of the disease process and Rx rationale.  I explained to the patient what I considered to be reasonable expectations given their personal situation.  The final treatment plan was arrived at through a shared patient decision making process model.      I personally saw and evaluated the patient, and participated in the management and treatment plan.  Huel Cote, MD Attending Physician, Orthopedic Surgery  This document was dictated using Dragon voice recognition software. A reasonable  attempt at proof reading has been made to minimize errors.

## 2023-04-12 NOTE — Addendum Note (Signed)
Addended by: Benancio Deeds on: 04/12/2023 02:28 PM   Modules accepted: Orders

## 2023-04-13 NOTE — Telephone Encounter (Addendum)
Thanks for taking the call.  Yes, I agree we can proceed with the colonoscopy as scheduled even with the diclofenac use.  Robert Cruz, Please let him know that.   - HD

## 2023-04-13 NOTE — Telephone Encounter (Signed)
Spoke with pt and he is aware it is ok to proceed as scheduled with his appt.

## 2023-04-14 ENCOUNTER — Encounter: Payer: Self-pay | Admitting: Gastroenterology

## 2023-04-14 ENCOUNTER — Ambulatory Visit: Payer: Managed Care, Other (non HMO) | Admitting: Gastroenterology

## 2023-04-14 VITALS — BP 105/48 | HR 86 | Temp 98.9°F | Resp 12 | Ht 74.0 in | Wt 319.0 lb

## 2023-04-14 DIAGNOSIS — Z1211 Encounter for screening for malignant neoplasm of colon: Secondary | ICD-10-CM

## 2023-04-14 DIAGNOSIS — D128 Benign neoplasm of rectum: Secondary | ICD-10-CM

## 2023-04-14 DIAGNOSIS — K621 Rectal polyp: Secondary | ICD-10-CM

## 2023-04-14 DIAGNOSIS — K219 Gastro-esophageal reflux disease without esophagitis: Secondary | ICD-10-CM

## 2023-04-14 DIAGNOSIS — D124 Benign neoplasm of descending colon: Secondary | ICD-10-CM | POA: Diagnosis not present

## 2023-04-14 MED ORDER — SODIUM CHLORIDE 0.9 % IV SOLN: 500.0000 mL | Freq: Once | INTRAVENOUS | Status: DC

## 2023-04-14 NOTE — Progress Notes (Signed)
Called to room to assist during endoscopic procedure.  Patient ID and intended procedure confirmed with present staff. Received instructions for my participation in the procedure from the performing physician.  

## 2023-04-14 NOTE — Progress Notes (Signed)
Uneventful anesthetic. Report to pacu rn. Vss. Care resumed by rn. 

## 2023-04-14 NOTE — Op Note (Addendum)
[facility] Patient Name: Robert Cruz Procedure Date: 04/14/2023 10:25 AM MRN: [id number] Endoscopist: Sherilyn Cooter L. Myrtie Neither , MD, [id number] Age: 47 Referring MD:  Date of Birth: 27-Dec-1975 Gender: Male Account #: [id number] Procedure:                Colonoscopy Indications:              Screening for colorectal malignant neoplasm, This                            is the patient's first colonoscopy Medicines:                Monitored Anesthesia Care Procedure:                Pre-Anesthesia Assessment:                           - Prior to the procedure, a History and Physical                            was performed, and patient medications and                            allergies were reviewed. The patient's tolerance of                            previous anesthesia was also reviewed. The risks                            and benefits of the procedure and the sedation                            options and risks were discussed with the patient.                            All questions were answered, and informed consent                            was obtained. Prior Anticoagulants: The patient has                            taken no anticoagulant or antiplatelet agents. ASA                            Grade Assessment: III - A patient with severe                            systemic disease. After reviewing the risks and                            benefits, the patient was deemed in satisfactory                            condition to undergo the procedure.  After obtaining informed consent, the colonoscope                            was passed under direct vision. Throughout the                            procedure, the patient's blood pressure, pulse, and                            oxygen saturations were monitored continuously. The                            CF [id number] #[id number] was introduced through the anus                            and advanced to the  the terminal ileum, with                            identification of the appendiceal orifice and IC                            valve. The colonoscopy was performed without                            difficulty. The patient tolerated the procedure                            well. The quality of the bowel preparation was good                            (some residual scattered fibrous debris in the                            right colon despite lavage). The terminal ileum,                            ileocecal valve, appendiceal orifice, and rectum                            were photographed. The bowel preparation used was                            SUPREP via split dose instruction. Scope In: 10:33:46 AM Scope Out: 10:50:48 AM Scope Withdrawal Time: 0 hours 12 minutes 49 seconds  Total Procedure Duration: 0 hours 17 minutes 2 seconds  Findings:                 The perianal and digital rectal examinations were                            normal.                           Two sessile polyps were found in the rectum and  descending colon. The polyps were diminutive in                            size. These polyps were removed with a cold snare.                            Resection and retrieval were complete.                           The exam was otherwise without abnormality on                            direct and retroflexion views. Complications:            No immediate complications. Estimated Blood Loss:     Estimated blood loss was minimal. Impression:               - Two diminutive polyps in the rectum and in the                            descending colon, removed with a cold snare.                            Resected and retrieved.                           - The examination was otherwise normal on direct                            and retroflexion views. Recommendation:           - Patient has a contact number available for                             emergencies. The signs and symptoms of potential                            delayed complications were discussed with the                            patient. Return to normal activities tomorrow.                            Written discharge instructions were provided to the                            patient.                           - Resume previous diet.                           - Continue present medications.                           - Await pathology results.                           -  Repeat colonoscopy is recommended for                            surveillance. The colonoscopy date will be                            determined after pathology results from today's                            exam become available for review.                           - See the other procedure note for documentation of                            additional recommendations. Eugena Rhue L. Myrtie Neither, MD 04/14/2023 11:06:08 AM This report has been signed electronically.

## 2023-04-14 NOTE — Patient Instructions (Signed)
Handout on polyps given. Follow antireflux regimen.     YOU HAD AN ENDOSCOPIC PROCEDURE TODAY AT THE  ENDOSCOPY CENTER:   Refer to the procedure report that was given to you for any specific questions about what was found during the examination.  If the procedure report does not answer your questions, please call your gastroenterologist to clarify.  If you requested that your care partner not be given the details of your procedure findings, then the procedure report has been included in a sealed envelope for you to review at your convenience later.  YOU SHOULD EXPECT: Some feelings of bloating in the abdomen. Passage of more gas than usual.  Walking can help get rid of the air that was put into your GI tract during the procedure and reduce the bloating. If you had a lower endoscopy (such as a colonoscopy or flexible sigmoidoscopy) you may notice spotting of blood in your stool or on the toilet paper. If you underwent a bowel prep for your procedure, you may not have a normal bowel movement for a few days.  Please Note:  You might notice some irritation and congestion in your nose or some drainage.  This is from the oxygen used during your procedure.  There is no need for concern and it should clear up in a day or so.  SYMPTOMS TO REPORT IMMEDIATELY:  Following lower endoscopy (colonoscopy or flexible sigmoidoscopy):  Excessive amounts of blood in the stool  Significant tenderness or worsening of abdominal pains  Swelling of the abdomen that is new, acute  Fever of 100F or higher  Following upper endoscopy (EGD)  Vomiting of blood or coffee ground material  New chest pain or pain under the shoulder blades  Painful or persistently difficult swallowing  New shortness of breath  Fever of 100F or higher  Black, tarry-looking stools  For urgent or emergent issues, a gastroenterologist can be reached at any hour by calling [phone]. Do not use MyChart messaging for urgent concerns.     DIET:  We do recommend a small meal at first, but then you may proceed to your regular diet.  Drink plenty of fluids but you should avoid alcoholic beverages for 24 hours.  ACTIVITY:  You should plan to take it easy for the rest of today and you should NOT DRIVE or use heavy machinery until tomorrow (because of the sedation medicines used during the test).    FOLLOW UP: Our staff will call the number listed on your records the next business day following your procedure.  We will call around 7:15- 8:00 am to check on you and address any questions or concerns that you may have regarding the information given to you following your procedure. If we do not reach you, we will leave a message.     If any biopsies were taken you will be contacted by phone or by letter within the next 1-3 weeks.  Please call us at [phone] if you have not heard about the biopsies in 3 weeks.    SIGNATURES/CONFIDENTIALITY: You and/or your care partner have signed paperwork which will be entered into your electronic medical record.  These signatures attest to the fact that that the information above on your After Visit Summary has been reviewed and is understood.  Full responsibility of the confidentiality of this discharge information lies with you and/or your care-partner.

## 2023-04-14 NOTE — Progress Notes (Signed)
No changes to clinical history since GI office visit on 03/22/23.  The patient is appropriate for an endoscopic procedure in the ambulatory setting.  - Robert Schnyder Danis, MD    

## 2023-04-14 NOTE — Progress Notes (Signed)
Pt's states no medical or surgical changes since previsit or office visit. 

## 2023-04-14 NOTE — Op Note (Signed)
[facility] Patient Name: Robert Cruz Procedure Date: 04/14/2023 10:24 AM MRN: [id number] Endoscopist: Sherilyn Cooter L. Myrtie Neither , MD, [id number] Age: 47 Referring MD:  Date of Birth: 02-May-1976 Gender: Male Account #: [id number] Procedure:                Upper GI endoscopy Indications:              Esophageal reflux symptoms that persist despite                            appropriate therapy Medicines:                Monitored Anesthesia Care Procedure:                Pre-Anesthesia Assessment:                           - Prior to the procedure, a History and Physical                            was performed, and patient medications and                            allergies were reviewed. The patient's tolerance of                            previous anesthesia was also reviewed. The risks                            and benefits of the procedure and the sedation                            options and risks were discussed with the patient.                            All questions were answered, and informed consent                            was obtained. Prior Anticoagulants: The patient has                            taken no anticoagulant or antiplatelet agents. ASA                            Grade Assessment: III - A patient with severe                            systemic disease. After reviewing the risks and                            benefits, the patient was deemed in satisfactory                            condition to undergo the procedure.  After obtaining informed consent, the endoscope was                            passed under direct vision. Throughout the                            procedure, the patient's blood pressure, pulse, and                            oxygen saturations were monitored continuously. The                            Olympus Scope [id number] was introduced through the                            mouth, and advanced to the second  part of duodenum.                            The upper GI endoscopy was accomplished without                            difficulty. The patient tolerated the procedure                            well. Scope In: Scope Out: Findings:                 The larynx was normal.                           A 1-2 cm sliding hiatal hernia was present.                           There is no endoscopic evidence of Barrett's                            esophagus, esophagitis or stricture in the entire                            esophagus.                           The stomach was normal.                           The cardia and gastric fundus were normal on                            retroflexion.                           The examined duodenum was normal. Complications:            No immediate complications. Estimated Blood Loss:     Estimated blood loss: none. Impression:               - Normal larynx.                           -  A 1-2 cm sliding hiatal hernia.                           - Normal stomach.                           - Normal examined duodenum.                           - No specimens collected. Recommendation:           - Patient has a contact number available for                            emergencies. The signs and symptoms of potential                            delayed complications were discussed with the                            patient. Return to normal activities tomorrow.                            Written discharge instructions were provided to the                            patient.                           - Resume previous diet.                           - Continue present medications.                           - See the other procedure note for documentation of                            additional recommendations.                           - Follow an antireflux regimen indefinitely. These                            measures are at least as important as medicines to                             help control reflux symptoms. Not eating within                            several hours of bed and elevating head of bed are                            especially important due to your nocturnal reflux  symptoms. This is even more the case since you are                            taking Mounjaro because this medicine delays the                            rate of stomach emptying.                           - Return to my office at appointment to be                            scheduled. Dannell Gortney L. Myrtie Neither, MD 04/14/2023 11:11:36 AM This report has been signed electronically.

## 2023-04-17 ENCOUNTER — Telehealth: Payer: Self-pay

## 2023-04-17 NOTE — Telephone Encounter (Signed)
Post procedure follow up call, no answer 

## 2023-04-19 ENCOUNTER — Encounter: Payer: Self-pay | Admitting: Gastroenterology

## 2023-05-24 ENCOUNTER — Ambulatory Visit: Payer: Managed Care, Other (non HMO) | Admitting: Family Medicine

## 2023-06-06 ENCOUNTER — Encounter: Payer: Self-pay | Admitting: Family

## 2023-06-06 ENCOUNTER — Ambulatory Visit (INDEPENDENT_AMBULATORY_CARE_PROVIDER_SITE_OTHER): Payer: Managed Care, Other (non HMO) | Admitting: Family

## 2023-06-06 VITALS — BP 122/78 | HR 88 | Ht 74.0 in | Wt 311.0 lb

## 2023-06-06 DIAGNOSIS — R7309 Other abnormal glucose: Secondary | ICD-10-CM | POA: Diagnosis not present

## 2023-06-06 NOTE — Progress Notes (Signed)
Robert Cruz is a 47 y.o. male with the following history as recorded in EpicCare:  Patient Active Problem List   Diagnosis Date Noted   Traumatic complete tear of left rotator cuff 04/12/2023   Traumatic complete tear of right rotator cuff 04/12/2023   Lower respiratory infection 10/04/2022   Right Achilles tendinitis 06/03/2022   Chronic gout 03/30/2022   Diarrhea 12/16/2021   Stage 3a chronic kidney disease (HCC) 09/01/2021   Left elbow pain 10/18/2019   Acute cough 10/03/2019   Mixed hyperlipidemia 09/08/2017   Allergic rhinitis 10/11/2016   Chronic migraine without aura without status migrainosus, not intractable 01/14/2016   Resistant hypertension 01/04/2016   Generalized headache 12/16/2015   Colon cancer screening 09/06/2013   GERD (gastroesophageal reflux disease) 09/06/2013   DYSPHAGIA UNSPECIFIED 02/08/2011   OSA (obstructive sleep apnea) 12/28/2008   Severe obesity (BMI >= 40) (HCC) 10/07/2008    Current Outpatient Medications  Medication Sig Dispense Refill   allopurinol (ZYLOPRIM) 100 MG tablet TAKE 1 TABLET BY MOUTH EVERY DAY 90 tablet 3   alum hydroxide-mag trisilicate (GAVISCON) 80-20 MG CHEW chewable tablet Chew 2 tablets by mouth 4 (four) times daily as needed for indigestion or heartburn.     atenolol (TENORMIN) 100 MG tablet TAKE 1 TABLET BY MOUTH EVERY DAY 90 tablet 2   atorvastatin (LIPITOR) 40 MG tablet TAKE 1 TABLET BY MOUTH EVERY DAY 90 tablet 3   hydrochlorothiazide (HYDRODIURIL) 25 MG tablet TAKE 1 TABLET (25 MG TOTAL) BY MOUTH DAILY. 90 tablet 2   lisinopril (ZESTRIL) 20 MG tablet TAKE 1 TABLET BY MOUTH EVERY DAY 90 tablet 3   omeprazole-sodium bicarbonate (ZEGERID) 40-1100 MG capsule TAKE 1 CAPSULE BY MOUTH ONCE DAILY BEFORE BREAKFAST 30 capsule 5   spironolactone (ALDACTONE) 100 MG tablet TAKE 1 TABLET BY MOUTH EVERY DAY 90 tablet 2   tirzepatide (MOUNJARO) 7.5 MG/0.5ML Pen Inject 7.5 mg into the skin once a week.     oxycodone (OXY-IR) 5 MG  capsule Take 1 capsule (5 mg total) by mouth every 4 (four) hours as needed (severe pain). (Patient not taking: Reported on 04/14/2023) 10 capsule 0   No current facility-administered medications for this visit.    Allergies: Amlodipine and Bystolic [nebivolol hcl]  Past Medical History:  Diagnosis Date   Chicken pox    GERD (gastroesophageal reflux disease)    Gout 2022   Hypertension    Morbid obesity (HCC)    Sleep apnea     No past surgical history on file.  Family History  Problem Relation Age of Onset   Hypertension Father    Depression Father    Hypertension Mother    Kidney disease Maternal Grandmother        Dialysis   Hypertension Maternal Grandmother    Healthy Brother        x1   Healthy Sister        x3   Colon cancer Neg Hx    Esophageal cancer Neg Hx    Rectal cancer Neg Hx    Stomach cancer Neg Hx     Social History   Tobacco Use   Smoking status: Some Days    Types: Cigars   Smokeless tobacco: Never  Substance Use Topics   Alcohol use: Yes    Comment: seldom    Subjective:   Started Mounjaro in Februray 2024 through [facility]- has been getting medications prescribed by virtual provider; has not had any type of follow up done and is  asking to have labs checked today;   Majority of his medications are managed by his cardiologist;   Of note, he had left our office and notes he is also planning to establish with new PCP at [facility] location which is closer to his house;    Objective:  Vitals:   06/06/23 1518  BP: 122/78  Pulse: 88  SpO2: 98%  Weight: (!) 311 lb (141.1 kg)  Height: 6\' 2"  (1.88 m)    General: Well developed, well nourished, in no acute distress  Skin : Warm and dry.  Head: Normocephalic and atraumatic  Lungs: Respirations unlabored; clear to auscultation bilaterally without wheeze, rales, rhonchi  CVS exam: normal rate and regular rhythm.  Neurologic: Alert and oriented; speech intact; face symmetrical; moves all  extremities well; CNII-XII intact without focal deficit   Assessment:  1. Elevated glucose     Plan:  Patient has been buying Mounjaro through virtual healthcare system; will update labs today per his request;  He is planning to establish care with provider at [facility].   No follow-ups on file.  Orders Placed This Encounter  Procedures   CBC with Differential/Platelet   Comp Met (CMET)   Hemoglobin A1c    Requested Prescriptions    No prescriptions requested or ordered in this encounter

## 2023-06-06 NOTE — Patient Instructions (Signed)
Please go ahead and get scheduled with your new PCP at [facility];

## 2023-06-07 ENCOUNTER — Other Ambulatory Visit: Payer: Self-pay | Admitting: Family

## 2023-06-07 DIAGNOSIS — R899 Unspecified abnormal finding in specimens from other organs, systems and tissues: Secondary | ICD-10-CM

## 2023-06-07 LAB — CBC WITH DIFFERENTIAL/PLATELET
Basophils Absolute: 0.1 10*3/uL (ref 0.0–0.1)
Basophils Relative: 1.4 % (ref 0.0–3.0)
Eosinophils Absolute: 0.5 10*3/uL (ref 0.0–0.7)
Eosinophils Relative: 5.3 % — ABNORMAL HIGH (ref 0.0–5.0)
HCT: 35.7 % — ABNORMAL LOW (ref 39.0–52.0)
Hemoglobin: 11.3 g/dL — ABNORMAL LOW (ref 13.0–17.0)
Lymphocytes Relative: 30.7 % (ref 12.0–46.0)
Lymphs Abs: 2.9 10*3/uL (ref 0.7–4.0)
MCHC: 31.8 g/dL (ref 30.0–36.0)
MCV: 84.5 fl (ref 78.0–100.0)
Monocytes Absolute: 0.7 10*3/uL (ref 0.1–1.0)
Monocytes Relative: 7.3 % (ref 3.0–12.0)
Neutro Abs: 5.2 10*3/uL (ref 1.4–7.7)
Neutrophils Relative %: 55.3 % (ref 43.0–77.0)
Platelets: 392 10*3/uL (ref 150.0–400.0)
RBC: 4.22 Mil/uL (ref 4.22–5.81)
RDW: 15.1 % (ref 11.5–15.5)
WBC: 9.4 10*3/uL (ref 4.0–10.5)

## 2023-06-07 LAB — HEMOGLOBIN A1C: Hgb A1c MFr Bld: 5.9 % (ref 4.6–6.5)

## 2023-06-07 LAB — COMPREHENSIVE METABOLIC PANEL
ALT: 10 U/L (ref 0–53)
AST: 14 U/L (ref 0–37)
Albumin: 4.2 g/dL (ref 3.5–5.2)
Alkaline Phosphatase: 85 U/L (ref 39–117)
BUN: 19 mg/dL (ref 6–23)
CO2: 26 mEq/L (ref 19–32)
Calcium: 9.6 mg/dL (ref 8.4–10.5)
Chloride: 101 mEq/L (ref 96–112)
Creatinine, Ser: 1.42 mg/dL (ref 0.40–1.50)
GFR: 58.88 mL/min — ABNORMAL LOW (ref >60.00)
Glucose, Bld: 76 mg/dL (ref 70–99)
Potassium: 4.4 mEq/L (ref 3.5–5.1)
Sodium: 135 mEq/L (ref 135–145)
Total Bilirubin: 0.3 mg/dL (ref 0.2–1.2)
Total Protein: 7.2 g/dL (ref 6.0–8.3)

## 2023-06-15 ENCOUNTER — Other Ambulatory Visit: Payer: Managed Care, Other (non HMO)

## 2023-06-15 DIAGNOSIS — R899 Unspecified abnormal finding in specimens from other organs, systems and tissues: Secondary | ICD-10-CM

## 2023-06-15 LAB — IBC + FERRITIN
Ferritin: 258.7 ng/mL (ref 22.0–322.0)
Iron: 32 ug/dL — ABNORMAL LOW (ref 42–165)
Saturation Ratios: 9.4 % — ABNORMAL LOW (ref 20.0–50.0)
TIBC: 338.8 ug/dL (ref 250.0–450.0)
Transferrin: 242 mg/dL (ref 212.0–360.0)

## 2023-06-22 ENCOUNTER — Ambulatory Visit: Payer: Managed Care, Other (non HMO) | Admitting: Urology

## 2023-06-22 ENCOUNTER — Encounter: Payer: Self-pay | Admitting: Urology

## 2023-06-22 VITALS — BP 95/65 | HR 86 | Ht 74.0 in | Wt 298.0 lb

## 2023-06-22 DIAGNOSIS — Z3009 Encounter for other general counseling and advice on contraception: Secondary | ICD-10-CM

## 2023-06-22 NOTE — Progress Notes (Signed)
Assessment: 1. Consultation for sterilization      Plan: Today I had a long and detailed discussion with the patient regarding vasa ectomy. As to the procedure, no scalpel technique vasectomy is explained and reviewed in detail.  Generalized risks including but not limited to bleeding, infection, orchalgia, testicular atrophy, epididymitis, scrotal hematoma, and chronic pain are discussed.   Additionally, he understands that the possibility of vas recanalization following vasectomy is possible although rare.  Most importantly, the patient understands that he is not sterile initially and will need a semen analysis check to confirm sterility such that no sperm are seen.  He is advised to avoid ejaculation for 10 days following the procedure.  The initial semen analysis will be checked in approximately 12 weeks and in some patients, several months may be required for clearance of all sperm.  He reports a clear understanding of the need for continued birth control until sterility is confirmed.  Otherwise, general issues regarding local anesthesia, prep, alprazolam are discussed and he reports a clear understanding.   Patient is going to discuss further with his wife to look over his schedule and will contact us to set up procedure.  Chief Complaint:  Chief Complaint  Patient presents with   VAS Consult    History of Present Illness:  Robert Cruz is a 47 y.o. male with a past medical history of morbid obesity, sleep apnea, poorly controlled hypertension, gout, DM-2 and stage II CKD who is seen today for vasectomy consultation.  The patient and his wife have carefully considered permanent sterilization and are comfortable with their decision.  Past Medical History:  Past Medical History:  Diagnosis Date   Chicken pox    GERD (gastroesophageal reflux disease)    Gout 2022   Hypertension    Morbid obesity (HCC)    Sleep apnea     Past Surgical History:  No past surgical history on  file.  Allergies:  Allergies  Allergen Reactions   Amlodipine Other (See Comments)    edema   Bystolic [Nebivolol Hcl] Other (See Comments)    Patient stated medication gave him major headaches    Family History:  Family History  Problem Relation Age of Onset   Hypertension Father    Depression Father    Hypertension Mother    Kidney disease Maternal Grandmother        Dialysis   Hypertension Maternal Grandmother    Healthy Brother        x1   Healthy Sister        x3   Colon cancer Neg Hx    Esophageal cancer Neg Hx    Rectal cancer Neg Hx    Stomach cancer Neg Hx     Social History:  Social History   Tobacco Use   Smoking status: Some Days    Types: Cigars   Smokeless tobacco: Never  Vaping Use   Vaping status: Never Used  Substance Use Topics   Alcohol use: Yes    Comment: seldom   Drug use: No    Review of symptoms:  Constitutional:  Negative for unexplained weight loss, night sweats, fever, chills ENT:  Negative for nose bleeds, sinus pain, painful swallowing CV:  Negative for chest pain, shortness of breath, exercise intolerance, palpitations, loss of consciousness Resp:  Negative for cough, wheezing, shortness of breath GI:  Negative for nausea, vomiting, diarrhea, bloody stools GU:  Positives noted in HPI; otherwise negative for gross hematuria, dysuria, urinary incontinence Neuro:  Negative for seizures, poor balance, limb weakness, slurred speech Psych:  Negative for lack of energy, depression, anxiety Endocrine:  Negative for polydipsia, polyuria, symptoms of hypoglycemia (dizziness, hunger, sweating) Hematologic:  Negative for anemia, purpura, petechia, prolonged or excessive bleeding, use of anticoagulants  Allergic:  Negative for difficulty breathing or choking as a result of exposure to anything; no shellfish allergy; no allergic response (rash/itch) to materials, foods  Physical exam: BP 95/65   Pulse 86   Ht 6\' 2"  (1.88 m)   Wt 298 lb  (135.2 kg)   BMI 38.26 kg/m  GENERAL APPEARANCE:  obese bm, NAD  GU:  nl ext genitalia.  Bilateral vas easily palpable

## 2023-07-13 ENCOUNTER — Other Ambulatory Visit: Payer: Self-pay | Admitting: Gastroenterology

## 2023-08-04 ENCOUNTER — Ambulatory Visit (INDEPENDENT_AMBULATORY_CARE_PROVIDER_SITE_OTHER): Payer: Managed Care, Other (non HMO) | Admitting: Family Medicine

## 2023-08-04 ENCOUNTER — Encounter: Payer: Self-pay | Admitting: Family Medicine

## 2023-08-04 ENCOUNTER — Other Ambulatory Visit: Payer: Self-pay | Admitting: Family Medicine

## 2023-08-04 VITALS — BP 112/80 | HR 82 | Temp 97.1°F | Ht 74.0 in | Wt 310.4 lb

## 2023-08-04 DIAGNOSIS — Z7985 Long-term (current) use of injectable non-insulin antidiabetic drugs: Secondary | ICD-10-CM

## 2023-08-04 DIAGNOSIS — Z23 Encounter for immunization: Secondary | ICD-10-CM | POA: Diagnosis not present

## 2023-08-04 DIAGNOSIS — E1122 Type 2 diabetes mellitus with diabetic chronic kidney disease: Secondary | ICD-10-CM | POA: Diagnosis not present

## 2023-08-04 DIAGNOSIS — E669 Obesity, unspecified: Secondary | ICD-10-CM | POA: Insufficient documentation

## 2023-08-04 DIAGNOSIS — E782 Mixed hyperlipidemia: Secondary | ICD-10-CM | POA: Diagnosis not present

## 2023-08-04 DIAGNOSIS — N1831 Chronic kidney disease, stage 3a: Secondary | ICD-10-CM

## 2023-08-04 DIAGNOSIS — M1A3720 Chronic gout due to renal impairment, left ankle and foot, without tophus (tophi): Secondary | ICD-10-CM

## 2023-08-04 MED ORDER — TIRZEPATIDE 2.5 MG/0.5ML ~~LOC~~ SOAJ: 2.5000 mg | SUBCUTANEOUS | 0 refills | Status: DC

## 2023-08-04 MED ORDER — TIRZEPATIDE 5 MG/0.5ML ~~LOC~~ SOAJ: 5.0000 mg | SUBCUTANEOUS | 1 refills | Status: DC

## 2023-08-04 NOTE — Progress Notes (Signed)
New Patient Office Visit  Subjective    Patient ID: Robert Cruz, male    DOB: 05/01/1976  Age: 47 y.o. MRN: [id number]  CC:  Chief Complaint  Patient presents with   Establish Care    Weight loss shots.     HPI [name] presents to establish care  Cardiologist- Dr. Elease Hashimoto   Colonoscopy in May 2024  States he had 1 polyp.   HTN- managed by cardiologist   Smokes cigars  [facility] has been prescribing tirzepatide. He would like for me to start prescribing this and file with his insurance. He has been off of the medication for 3 weeks. Reports losing around 50 lbs.     Drives a truck  Married  2 grandchildren      Outpatient Encounter Medications as of 08/04/2023  Medication Sig   allopurinol (ZYLOPRIM) 100 MG tablet TAKE 1 TABLET BY MOUTH EVERY DAY   alum hydroxide-mag trisilicate (GAVISCON) 80-20 MG CHEW chewable tablet Chew 2 tablets by mouth 4 (four) times daily as needed for indigestion or heartburn.   atenolol (TENORMIN) 100 MG tablet TAKE 1 TABLET BY MOUTH EVERY DAY   atorvastatin (LIPITOR) 40 MG tablet TAKE 1 TABLET BY MOUTH EVERY DAY   hydrochlorothiazide (HYDRODIURIL) 25 MG tablet TAKE 1 TABLET (25 MG TOTAL) BY MOUTH DAILY.   lisinopril (ZESTRIL) 20 MG tablet TAKE 1 TABLET BY MOUTH EVERY DAY   omeprazole-sodium bicarbonate (ZEGERID) 40-1100 MG capsule TAKE 1 CAPSULE BY MOUTH ONCE DAILY BEFORE BREAKFAST   spironolactone (ALDACTONE) 100 MG tablet TAKE 1 TABLET BY MOUTH EVERY DAY   tirzepatide (MOUNJARO) 5 MG/0.5ML Pen Inject 5 mg into the skin once a week.   [DISCONTINUED] tirzepatide [facility]) 2.5 MG/0.5ML Pen Inject 2.5 mg into the skin once a week.   [DISCONTINUED] tirzepatide (MOUNJARO) 7.5 MG/0.5ML Pen Inject 7.5 mg into the skin once a week. (Patient not taking: Reported on 08/04/2023)   No facility-administered encounter medications on file as of 08/04/2023.    Past Medical History:  Diagnosis Date   Chicken pox    GERD  (gastroesophageal reflux disease)    Gout 2022   Hypertension    Morbid obesity (HCC)    Sleep apnea     History reviewed. No pertinent surgical history.  Family History  Problem Relation Age of Onset   Hypertension Father    Depression Father    Hypertension Mother    Kidney disease Maternal Grandmother        Dialysis   Hypertension Maternal Grandmother    Healthy Brother        x1   Healthy Sister        x3   Colon cancer Neg Hx    Esophageal cancer Neg Hx    Rectal cancer Neg Hx    Stomach cancer Neg Hx     Social History   Socioeconomic History   Marital status: Married    Spouse name: Not on file   Number of children: 5   Years of education: 13   Highest education level: Not on file  Occupational History   Occupation: Information systems manager: [employer]  Tobacco Use   Smoking status: Some Days    Types: Cigars   Smokeless tobacco: Never  Vaping Use   Vaping status: Never Used  Substance and Sexual Activity   Alcohol use: Not Currently    Comment: seldom   Drug use: No   Sexual activity: Yes  Other Topics  Concern   Not on file  Social History Narrative   Right handed   Two Story Home    Lives with wife and family   Drinks Caffeine everyday   Social Determinants of Health   Financial Resource Strain: Not on file  Food Insecurity: Not on file  Transportation Needs: Not on file  Physical Activity: Not on file  Stress: Not on file  Social Connections: Not on file  Intimate Partner Violence: Not on file    Review of Systems  Constitutional:  Positive for weight loss. Negative for chills and fever.       Intentional   Respiratory:  Negative for shortness of breath.   Cardiovascular:  Negative for chest pain, palpitations and leg swelling.  Gastrointestinal:  Negative for abdominal pain, constipation, diarrhea, nausea and vomiting.  Genitourinary:  Negative for dysuria, frequency and urgency.  Neurological:  Negative for dizziness and focal  weakness.        Objective    BP 112/80 (BP Location: Left Arm, Patient Position: Sitting, Cuff Size: Large)   Pulse 82   Temp (!) 97.1 F (36.2 C) (Temporal)   Ht 6\' 2"  (1.88 m)   Wt (!) 310 lb 6.4 oz (140.8 kg)   SpO2 96%   BMI 39.85 kg/m   Physical Exam Constitutional:      General: He is not in acute distress.    Appearance: He is not ill-appearing.  Eyes:     Extraocular Movements: Extraocular movements intact.     Conjunctiva/sclera: Conjunctivae normal.  Cardiovascular:     Rate and Rhythm: Normal rate and regular rhythm.  Pulmonary:     Effort: Pulmonary effort is normal.     Breath sounds: Normal breath sounds.  Musculoskeletal:     Cervical back: Normal range of motion and neck supple.     Right lower leg: No edema.     Left lower leg: No edema.  Skin:    General: Skin is warm and dry.  Neurological:     General: No focal deficit present.     Mental Status: He is alert and oriented to person, place, and time.  Psychiatric:        Mood and Affect: Mood normal.        Behavior: Behavior normal.        Thought Content: Thought content normal.         Assessment & Plan:   Problem List Items Addressed This Visit       Endocrine   Type 2 diabetes mellitus with chronic kidney disease, without long-term current use of insulin (HCC) - Primary   Relevant Medications   tirzepatide (MOUNJARO) 5 MG/0.5ML Pen     Genitourinary   Stage 3a chronic kidney disease (HCC)     Other   Chronic gout   Mixed hyperlipidemia   Obesity (BMI 30-39.9)   Relevant Medications   tirzepatide (MOUNJARO) 5 MG/0.5ML Pen   Other Visit Diagnoses     Need for influenza vaccination       Relevant Orders   Flu vaccine trivalent PF, 6mos and older(Flulaval,Afluria,Fluarix,Fluzone) (Completed)      Reviewed notes and results from recent visits with GI and cardiology.  Mounjaro prescribed at 2.5 mg since he has been off of 7.5 mg dose for 3 weeks. Notified that 2.5 mg dose  is not available. Mounjaro 5 mg was then prescribed.  Continue current medications.  Continue follow up with specialists.    Return in about 2  months (around 10/04/2023) for Fasting follow up.   Hetty Blend, NP-C

## 2023-08-04 NOTE — Patient Instructions (Signed)
Continue your current medications.   Let's see how you do restarting Mounjaro.   Let me know when you are ready for a refill of your Mounjaro (week 3)  Follow up in 8 weeks

## 2023-08-11 ENCOUNTER — Other Ambulatory Visit (HOSPITAL_COMMUNITY): Payer: Self-pay

## 2023-08-11 ENCOUNTER — Telehealth: Payer: Self-pay

## 2023-08-11 NOTE — Telephone Encounter (Signed)
Pharmacy Patient Advocate Encounter   Received notification from CoverMyMeds that prior authorization for [facility] 5MG /0.5ML pen-injectors is required/requested.   Insurance verification completed.   The patient is insured through [employer] .   Per test claim: APPROVED from 08/11/23 to 08/10/24. Ran test claim, Copay is $0. This test claim was processed through [facility]- copay amounts may vary at other pharmacies due to pharmacy/plan contracts, or as the patient moves through the different stages of their insurance plan.   CaseId:[id number] Key: [id number]

## 2023-08-14 ENCOUNTER — Encounter: Payer: Self-pay | Admitting: Family Medicine

## 2023-08-14 NOTE — Telephone Encounter (Signed)
I see you ran test claim on 9/6, does this mean it has been approved already and I can notify pt?

## 2023-08-16 ENCOUNTER — Other Ambulatory Visit (HOSPITAL_COMMUNITY): Payer: Self-pay

## 2023-08-16 NOTE — Telephone Encounter (Signed)
Encounter on 08-11-2023 shows a PA approval. Patient should be able to get this filled now.

## 2023-08-17 ENCOUNTER — Ambulatory Visit: Payer: Managed Care, Other (non HMO) | Admitting: Family Medicine

## 2023-08-17 ENCOUNTER — Ambulatory Visit: Payer: Managed Care, Other (non HMO)

## 2023-08-17 ENCOUNTER — Encounter: Payer: Self-pay | Admitting: Internal Medicine

## 2023-08-17 ENCOUNTER — Ambulatory Visit: Payer: Managed Care, Other (non HMO) | Admitting: Internal Medicine

## 2023-08-17 VITALS — BP 120/76 | HR 86 | Temp 98.8°F | Ht 74.0 in | Wt 315.0 lb

## 2023-08-17 DIAGNOSIS — J069 Acute upper respiratory infection, unspecified: Secondary | ICD-10-CM | POA: Insufficient documentation

## 2023-08-17 DIAGNOSIS — R051 Acute cough: Secondary | ICD-10-CM | POA: Diagnosis not present

## 2023-08-17 LAB — POC COVID19 BINAXNOW: SARS Coronavirus 2 Ag: NEGATIVE

## 2023-08-17 LAB — POCT INFLUENZA A/B
Influenza A, POC: NEGATIVE
Influenza B, POC: NEGATIVE

## 2023-08-17 MED ORDER — HYDROCODONE BIT-HOMATROP MBR 5-1.5 MG/5ML PO SOLN
5.0000 mL | Freq: Three times a day (TID) | ORAL | 0 refills | Status: DC | PRN
Start: 2023-08-17 — End: 2024-04-15

## 2023-08-17 NOTE — Patient Instructions (Signed)

## 2023-08-17 NOTE — Progress Notes (Unsigned)
Subjective:  Patient ID: Robert Cruz, male    DOB: 01/19/1976  Age: 47 y.o. MRN: [id number]  CC: Cough and URI   HPI [name] presents for f/up ----  Discussed the use of AI scribe software for clinical note transcription with the patient, who gave verbal consent to proceed.  History of Present Illness   The patient, with a history of COVID-19 infection two years ago, presents with a dry cough, nasal congestion, and frequent sneezing for the past three to four days. He also reports a persistent tingling sensation in the nose. The symptoms started gradually and have now developed into a more noticeable cough. The patient denies any production of phlegm or blood with the cough. He also denies any shortness of breath, wheezing, fever, chills, chest pain, or swelling in the legs or feet.  The patient had a sore throat at the onset of the symptoms, which has since resolved. He also reports a runny nose, which has been a significant issue. He has tried over-the-counter antihistamines, but these have not provided any relief.  The patient had a flu shot two weeks ago and had a negative COVID-19 test recently. He has not been around anyone who has been sick recently. He has an appetite and denies any gastrointestinal issues.       Outpatient Medications Prior to Visit  Medication Sig Dispense Refill   allopurinol (ZYLOPRIM) 100 MG tablet TAKE 1 TABLET BY MOUTH EVERY DAY 90 tablet 3   alum hydroxide-mag trisilicate (GAVISCON) 80-20 MG CHEW chewable tablet Chew 2 tablets by mouth 4 (four) times daily as needed for indigestion or heartburn.     atenolol (TENORMIN) 100 MG tablet TAKE 1 TABLET BY MOUTH EVERY DAY 90 tablet 2   atorvastatin (LIPITOR) 40 MG tablet TAKE 1 TABLET BY MOUTH EVERY DAY 90 tablet 3   hydrochlorothiazide (HYDRODIURIL) 25 MG tablet TAKE 1 TABLET (25 MG TOTAL) BY MOUTH DAILY. 90 tablet 2   lisinopril (ZESTRIL) 20 MG tablet TAKE 1 TABLET BY MOUTH EVERY DAY 90 tablet 3    omeprazole-sodium bicarbonate (ZEGERID) 40-1100 MG capsule TAKE 1 CAPSULE BY MOUTH ONCE DAILY BEFORE BREAKFAST 30 capsule 6   spironolactone (ALDACTONE) 100 MG tablet TAKE 1 TABLET BY MOUTH EVERY DAY 90 tablet 2   tirzepatide (MOUNJARO) 5 MG/0.5ML Pen Inject 5 mg into the skin once a week. 2 mL 1   No facility-administered medications prior to visit.    ROS Review of Systems  Constitutional:  Negative for appetite change, chills, diaphoresis, fatigue and fever.  HENT:  Positive for congestion, postnasal drip, rhinorrhea, sneezing and sore throat. Negative for facial swelling, sinus pressure, sinus pain and trouble swallowing.   Eyes: Negative.   Respiratory:  Positive for cough. Negative for chest tightness and shortness of breath.   Cardiovascular:  Negative for chest pain, palpitations and leg swelling.  Gastrointestinal: Negative.  Negative for abdominal pain, constipation, diarrhea, nausea and vomiting.  Genitourinary: Negative.  Negative for difficulty urinating.  Musculoskeletal: Negative.  Negative for arthralgias and back pain.  Skin: Negative.  Negative for color change and rash.  Neurological: Negative.  Negative for dizziness and weakness.  Hematological:  Negative for adenopathy. Does not bruise/bleed easily.  Psychiatric/Behavioral: Negative.      Objective:  BP 120/76 (BP Location: Left Arm, Patient Position: Sitting, Cuff Size: Large)   Pulse 86   Temp 98.8 F (37.1 C) (Oral)   Ht 6\' 2"  (1.88 m)   Wt (!) 315 lb (  142.9 kg)   SpO2 95%   BMI 40.44 kg/m   BP Readings from Last 3 Encounters:  08/17/23 120/76  08/04/23 112/80  06/22/23 95/65    Wt Readings from Last 3 Encounters:  08/17/23 (!) 315 lb (142.9 kg)  08/04/23 (!) 310 lb 6.4 oz (140.8 kg)  06/22/23 298 lb (135.2 kg)    Physical Exam Vitals reviewed.  Constitutional:      General: He is not in acute distress.    Appearance: Normal appearance. He is not toxic-appearing or diaphoretic.  HENT:      Mouth/Throat:     Mouth: Mucous membranes are moist.  Eyes:     General: No scleral icterus.    Conjunctiva/sclera: Conjunctivae normal.  Cardiovascular:     Rate and Rhythm: Normal rate and regular rhythm.     Heart sounds: No murmur heard. Pulmonary:     Effort: Pulmonary effort is normal.     Breath sounds: No stridor. No wheezing, rhonchi or rales.  Abdominal:     General: Abdomen is flat.     Palpations: There is no mass.     Tenderness: There is no abdominal tenderness. There is no guarding.     Hernia: No hernia is present.  Musculoskeletal:        General: Normal range of motion.     Cervical back: Neck supple.     Right lower leg: No edema.     Left lower leg: No edema.  Lymphadenopathy:     Cervical: No cervical adenopathy.  Skin:    General: Skin is warm and dry.  Neurological:     General: No focal deficit present.     Mental Status: He is alert. Mental status is at baseline.  Psychiatric:        Mood and Affect: Mood normal.        Behavior: Behavior normal.     Lab Results  Component Value Date   WBC 9.4 06/06/2023   HGB 11.3 (L) 06/06/2023   HCT 35.7 (L) 06/06/2023   PLT 392.0 06/06/2023   GLUCOSE 76 06/06/2023   CHOL 127 12/02/2022   TRIG 93 12/02/2022   HDL 43 12/02/2022   LDLCALC 66 12/02/2022   ALT 10 06/06/2023   AST 14 06/06/2023   NA 135 06/06/2023   K 4.4 06/06/2023   CL 101 06/06/2023   CREATININE 1.42 06/06/2023   BUN 19 06/06/2023   CO2 26 06/06/2023   TSH 1.13 07/13/2018   PSA 0.48 07/13/2018   HGBA1C 5.9 06/06/2023    DG Chest 2 View  Result Date: 08/17/2023 CLINICAL DATA:  Cough for 2 days EXAM: CHEST - 2 VIEW COMPARISON:  10/10/2017 FINDINGS: The heart size and mediastinal contours are within normal limits. Both lungs are clear. The visualized skeletal structures are unremarkable. IMPRESSION: No active cardiopulmonary disease. Electronically Signed   By: Jasmine Pang M.D.   On: 08/17/2023 18:29     Assessment & Plan:    URI, acute -     HYDROcodone Bit-Homatrop MBr; Take 5 mLs by mouth every 8 (eight) hours as needed for cough.  Dispense: 120 mL; Refill: 0 -     POC COVID-19 BinaxNow -     POCT Influenza A/B  Acute cough- chest x-ray is negative for mass or infiltrate.  Testing for COVID and influenza are negative.  Will treat for viral URI. -     DG Chest 2 View; Future -     HYDROcodone Bit-Homatrop MBr; Take  5 mLs by mouth every 8 (eight) hours as needed for cough.  Dispense: 120 mL; Refill: 0 -     POC COVID-19 BinaxNow -     POCT Influenza A/B     Follow-up: Return in about 3 weeks (around 09/07/2023).  Sanda Linger, MD

## 2023-09-13 ENCOUNTER — Other Ambulatory Visit: Payer: Self-pay

## 2023-09-13 ENCOUNTER — Ambulatory Visit: Payer: Managed Care, Other (non HMO) | Admitting: Family Medicine

## 2023-09-13 VITALS — BP 98/70 | HR 88 | Ht 74.0 in | Wt 312.0 lb

## 2023-09-13 DIAGNOSIS — G8929 Other chronic pain: Secondary | ICD-10-CM

## 2023-09-13 DIAGNOSIS — M25512 Pain in left shoulder: Secondary | ICD-10-CM

## 2023-09-13 DIAGNOSIS — M1A0720 Idiopathic chronic gout, left ankle and foot, without tophus (tophi): Secondary | ICD-10-CM | POA: Diagnosis not present

## 2023-09-13 MED ORDER — COLCHICINE 0.6 MG PO TABS: 0.6000 mg | ORAL_TABLET | Freq: Every day | ORAL | 2 refills | Status: DC | PRN

## 2023-09-13 MED ORDER — ALLOPURINOL 100 MG PO TABS: 200.0000 mg | ORAL_TABLET | Freq: Every day | ORAL | 3 refills | Status: DC

## 2023-09-13 NOTE — Progress Notes (Unsigned)
   Rubin Payor, PhD, LAT, ATC acting as a scribe for [name], MD.  Robert Cruz is a 47 y.o. male who presents to [facility] today for cont'd L shoulder pain. Pt was last seen by Dr. [name] on 03/22/23 and was given a L GH steroid injection and was referred to orthopedic surgery. Pt had a visit w/ Dr. Steward Drone on 04/12/23 and was planning to undergo L shoulder arthroscopy w/ RC repair.   Today, pt reports L shoulder pain is just gradually starting to return. He had been intending on pursuing the [facility] surgery. He notes good AROM and L shoulder has been feeling pretty good overall.   He also would like to discussed his gout rx and dosing, as he has had 2 fairly recent gout flares.  Dx imaging: 07/17/22 L shoulder MRI             04/29/22 L shoulder XR   Pertinent review of systems: ***  Relevant historical information: ***   Exam:  There were no vitals taken for this visit. General: Well Developed, well nourished, and in no acute distress.   MSK: ***    Lab and Radiology Results No results found for this or any previous visit (from the past 72 hour(s)). No results found.     Assessment and Plan: 47 y.o. male with ***   PDMP not reviewed this encounter. No orders of the defined types were placed in this encounter.  No orders of the defined types were placed in this encounter.    Discussed warning signs or symptoms. Please see discharge instructions. Patient expresses understanding.   ***

## 2023-09-13 NOTE — Patient Instructions (Signed)
Thank you for coming in today.   You received an injection today. Seek immediate medical attention if the joint becomes red, extremely painful, or is oozing fluid.  

## 2023-10-15 ENCOUNTER — Other Ambulatory Visit: Payer: Self-pay | Admitting: Family Medicine

## 2023-10-16 NOTE — Telephone Encounter (Signed)
Last OV 09/13/23 Next OV not scheduled  Last refill: 09/13/23

## 2023-11-18 ENCOUNTER — Other Ambulatory Visit: Payer: Self-pay | Admitting: Cardiovascular Disease

## 2023-12-21 NOTE — Progress Notes (Signed)
   I, Stevenson Clinch, CMA  acting as a scribe for Robert Graham, MD.  Robert Cruz is a 48 y.o. male who presents to [facility] at [facility] today for cont'd L shoulder pain. Pt was last seen by Dr. [name] on 09/13/23 and was given a L GH steroid injection and his allopurinol was increased to 200mg .  Today, pt reports worsening sx, waxing and waning, over the past few weeks. Attributes exacerbations to work activities, heavy lifting. ROM overall well maintained.   Dx imaging: 07/17/22 L shoulder MRI             04/29/22 L shoulder XR  Pertinent review of systems: No fevers or chills  Relevant historical information: Recurrent left shoulder pain   Exam:  BP 112/74   Pulse 78   Ht 6\' 2"  (1.88 m)   Wt 300 lb (136.1 kg)   SpO2 98%   BMI 38.52 kg/m  General: Well Developed, well nourished, and in no acute distress.   MSK: Left shoulder normal-appearing normal motion pain with abduction.    Lab and Radiology Results  Procedure: Real-time Ultrasound Guided Injection of left shoulder glenohumeral joint posterior approach Device: Philips Affiniti 50G/GE Logiq Images permanently stored and available for review in PACS Verbal informed consent obtained.  Discussed risks and benefits of procedure. Warned about infection, bleeding, hyperglycemia damage to structures among others. Patient expresses understanding and agreement Time-out conducted.   Noted no overlying erythema, induration, or other signs of local infection.   Skin prepped in a sterile fashion.   Local anesthesia: Topical Ethyl chloride.   With sterile technique and under real time ultrasound guidance: 40 mg of Kenalog and 2 mL of Marcaine injected into shoulder joint. Fluid seen entering the joint capsule.   Completed without difficulty   Pain immediately resolved suggesting accurate placement of the medication.   Advised to call if fevers/chills, erythema, induration, drainage, or persistent bleeding.    Images permanently stored and available for review in the ultrasound unit.  Impression: Technically successful ultrasound guided injection.         Assessment and Plan: 48 y.o. male with chronic left shoulder pain.  This is a recurrent problem.  Previous injection was about 3 months ago.  Prior to that it had been about 6 months.  He does have an MRI from late 2023 that shows rotator cuff tendinitis with small focal tear.  He already has had a consultation with Dr. Steward Drone in the past.  If today's injection is not long-lasting would recommend recheck back with orthopedic surgeon.   PDMP not reviewed this encounter. Orders Placed This Encounter  Procedures   [place] LIMITED JOINT SPACE STRUCTURES UP LEFT(NO LINKED CHARGES)    Reason for Exam (SYMPTOM  OR DIAGNOSIS REQUIRED):   left shoulder pain    Preferred imaging location?:   [facility]   No orders of the defined types were placed in this encounter.    Discussed warning signs or symptoms. Please see discharge instructions. Patient expresses understanding.   The above documentation has been reviewed and is accurate and complete Robert Cruz, M.D.

## 2023-12-22 ENCOUNTER — Ambulatory Visit: Payer: Managed Care, Other (non HMO) | Admitting: Family Medicine

## 2023-12-22 ENCOUNTER — Encounter: Payer: Self-pay | Admitting: Family Medicine

## 2023-12-22 ENCOUNTER — Other Ambulatory Visit: Payer: Self-pay

## 2023-12-22 VITALS — BP 112/74 | HR 78 | Ht 74.0 in | Wt 300.0 lb

## 2023-12-22 DIAGNOSIS — M25512 Pain in left shoulder: Secondary | ICD-10-CM | POA: Diagnosis not present

## 2023-12-22 DIAGNOSIS — G8929 Other chronic pain: Secondary | ICD-10-CM | POA: Diagnosis not present

## 2023-12-22 NOTE — Patient Instructions (Signed)
Thank you for coming in today.   You received an injection today. Seek immediate medical attention if the joint becomes red, extremely painful, or is oozing fluid.  

## 2024-01-22 ENCOUNTER — Other Ambulatory Visit: Payer: Self-pay | Admitting: Cardiovascular Disease

## 2024-02-02 ENCOUNTER — Other Ambulatory Visit: Payer: Self-pay | Admitting: Cardiovascular Disease

## 2024-02-11 ENCOUNTER — Other Ambulatory Visit: Payer: Self-pay | Admitting: Cardiovascular Disease

## 2024-02-14 ENCOUNTER — Other Ambulatory Visit: Payer: Self-pay | Admitting: Cardiovascular Disease

## 2024-02-14 MED ORDER — ATORVASTATIN CALCIUM 40 MG PO TABS: 40.0000 mg | ORAL_TABLET | Freq: Every day | ORAL | 0 refills | Status: DC

## 2024-02-17 ENCOUNTER — Other Ambulatory Visit: Payer: Self-pay | Admitting: Gastroenterology

## 2024-02-24 ENCOUNTER — Other Ambulatory Visit: Payer: Self-pay | Admitting: Cardiovascular Disease

## 2024-02-26 NOTE — Telephone Encounter (Signed)
 Called and scheduled patient for visit on 03/27/24. One month supply sent to pharmacy.

## 2024-02-26 NOTE — Telephone Encounter (Signed)
 Pt is requesting refills for Lisinopril 20 mg (1 tab daily) and Atenolol 100 mg (1 tab daily) Pt last appt was 11/24/22 with Dr Elease Hashimoto. Pt does not have upcoming appt scheduled. Please advice, would Dr Elease Hashimoto want these meds refilled without appt.   Thank you

## 2024-03-01 ENCOUNTER — Other Ambulatory Visit: Payer: Self-pay | Admitting: Cardiovascular Disease

## 2024-03-11 ENCOUNTER — Ambulatory Visit (INDEPENDENT_AMBULATORY_CARE_PROVIDER_SITE_OTHER)

## 2024-03-11 ENCOUNTER — Other Ambulatory Visit: Payer: Self-pay | Admitting: Cardiovascular Disease

## 2024-03-11 ENCOUNTER — Encounter: Payer: Self-pay | Admitting: Family Medicine

## 2024-03-11 ENCOUNTER — Other Ambulatory Visit: Payer: Self-pay

## 2024-03-11 ENCOUNTER — Ambulatory Visit: Admitting: Family Medicine

## 2024-03-11 VITALS — BP 128/82 | HR 78 | Ht 74.0 in | Wt 304.0 lb

## 2024-03-11 DIAGNOSIS — M1631 Unilateral osteoarthritis resulting from hip dysplasia, right hip: Secondary | ICD-10-CM | POA: Diagnosis not present

## 2024-03-11 DIAGNOSIS — M25551 Pain in right hip: Secondary | ICD-10-CM | POA: Diagnosis not present

## 2024-03-11 NOTE — Progress Notes (Signed)
 I, Stevenson Clinch, CMA acting as a scribe for [name], MD.  Robert Cruz is a 48 y.o. male who presents to [facility] at [facility] today for R hip pain. Pt was last seen by Dr. [name] on 12/22/23 for his L shoulder.  Today, pt c/o R hip pain x 2-wks. Pt locates pain to right hip, anterior aspect, radiating into the groin. Some n/t, denies weakness.  He notes some pain in the lateral hip as well.  Pain worse with ambulation, prolonged sitting and rolling over in bed. Denies testicular pain or bladder dysfunction.  He cannot think of any activity or injury that might of caused this.  Radiates: into the groin Aggravates: sitting, ambulation, turning over Treatments tried: on allopurinol for gout  Pertinent review of systems: No fevers or chills  Relevant historical information: Hypertension, diabetes, CKD. 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  Exam:  BP 128/82   Pulse 78   Ht 6\' 2"  (1.88 m)   Wt (!) 304 lb (137.9 kg)   SpO2 98%   BMI 39.03 kg/m  General: Well Developed, well nourished, and in no acute distress.   MSK: Right hip normal-appearing Decreased range of motion. Intact strength to flexion and abduction. Pain with flexion and rotation.    Lab and Radiology Results  Procedure: Real-time Ultrasound Guided Injection of right hip from acetabular joint anterior approach Device: Philips Affiniti 50G/GE Logiq Images permanently stored and available for review in PACS Verbal informed consent obtained.  Discussed risks and benefits of procedure. Warned about infection, bleeding, hyperglycemia damage to structures among others. Patient expresses understanding and agreement Time-out conducted.   Noted no overlying erythema, induration, or other signs of local infection.   Skin prepped in a sterile fashion.   Local anesthesia: Topical Ethyl chloride.   With sterile technique and under real time ultrasound guidance: 40  mg of Kenalog and 2 mL of Marcaine injected into hip joint. Fluid seen entering the joint capsule.   Completed without difficulty   Pain immediately resolved suggesting accurate placement of the medication.   Advised to call if fevers/chills, erythema, induration, drainage, or persistent bleeding.   Images permanently stored and available for review in the ultrasound unit.  Impression: Technically successful ultrasound guided injection.      X-ray images right hip and lumbar spine obtained today personally and independently interpreted.  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  No acute fractures are visible.  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  Await formal radiology review    Assessment and Plan: 48 y.o. male with right anterior hip pain.  Pain is quite severe and has only been ongoing for about 2 weeks.  Based on imaging hip arthritis and hip impingement is the most likely explanation.  He did have an interarticular injection today which provided moderate benefit.  I expect he likely will need a hip replacement at some point relatively soon.  Will go ahead and refer to orthopedic surgery now to start talking about that so we can start planning.  He knows that he will have to wait at least 3 months after today's injection before he can have a hip replacement.   PDMP not reviewed this encounter. Orders Placed This Encounter  Procedures   [place] LIMITED JOINT SPACE STRUCTURES LOW RIGHT(NO LINKED CHARGES)    Reason for Exam (SYMPTOM  OR DIAGNOSIS REQUIRED):  right hip pain    Preferred imaging location?:   [facility] Lumbar Spine 2-3 Views    Standing Status:   Future    Number of Occurrences:   1    Expiration Date:   04/10/2024    Reason for Exam (SYMPTOM  OR DIAGNOSIS REQUIRED):   right hip pain    Preferred imaging location?:   [facility]   DG HIP UNILAT W OR W/O PELVIS 2-3 VIEWS  RIGHT    Standing Status:   Future    Number of Occurrences:   1    Expiration Date:   04/10/2024    Reason for Exam (SYMPTOM  OR DIAGNOSIS REQUIRED):   right hip pain    Preferred imaging location?:   [facility]   Ambulatory referral to Orthopedic Surgery    Referral Priority:   Routine    Referral Type:   Surgical    Referral Reason:   Specialty Services Required    Referred to Provider:   Kathryne Hitch, MD    Requested Specialty:   Orthopedic Surgery    Number of Visits Requested:   1   No orders of the defined types were placed in this encounter.    Discussed warning signs or symptoms. Please see discharge instructions. Patient expresses understanding.   The above documentation has been reviewed and is accurate and complete [name], M.D.

## 2024-03-11 NOTE — Patient Instructions (Addendum)
 Thank you for coming in today.   You received an injection today. Seek immediate medical attention if the joint becomes red, extremely painful, or is oozing fluid.   A referral has been placed for an Orthopedic consult with Dr. Magnus Ivan.

## 2024-03-13 ENCOUNTER — Encounter: Payer: Self-pay | Admitting: Family Medicine

## 2024-03-13 NOTE — Progress Notes (Signed)
 Hip x-ray shows arthritis on both sides worse on the right.  How are you feeling after the injection?

## 2024-03-13 NOTE — Progress Notes (Signed)
 Low back x-ray looks okay to radiology.

## 2024-03-14 ENCOUNTER — Telehealth: Payer: Self-pay | Admitting: Family Medicine

## 2024-03-14 NOTE — Telephone Encounter (Signed)
 Forwarding to Dr. [name] to review and advise.

## 2024-03-14 NOTE — Telephone Encounter (Signed)
 Seen Monday for R hip, recd steroid injection. Has appt with surgeon 04/01/2024.  Struggling with pain, the shot helped until he went back to work on Wednesday and was on his feet. Pt would like help/advice/alternative to ease the hip pain until he is able to have the surgery.

## 2024-03-15 MED ORDER — HYDROCODONE-ACETAMINOPHEN 5-325 MG PO TABS: 1.0000 | ORAL_TABLET | Freq: Four times a day (QID) | ORAL | 0 refills | Status: DC | PRN

## 2024-03-15 NOTE — Telephone Encounter (Signed)
 I prescribed hydrocodone.  This is a strong pain medicine.  Use with caution as it could make you sleepy.

## 2024-03-15 NOTE — Telephone Encounter (Signed)
 Left a voice message for patient letting him know that the medication has been sent to his pharmacy. Mychart message was also sent.

## 2024-03-15 NOTE — Addendum Note (Signed)
 Addended by: Rodolph Bong on: 03/15/2024 01:23 PM   Modules accepted: Orders

## 2024-03-15 NOTE — Telephone Encounter (Signed)
 Robert Cruz called back and I talked with him. He is seeing Dr Magnus Ivan on 4/28

## 2024-03-19 ENCOUNTER — Encounter: Payer: Self-pay | Admitting: Family Medicine

## 2024-03-19 ENCOUNTER — Other Ambulatory Visit: Payer: Self-pay | Admitting: Family Medicine

## 2024-03-19 MED ORDER — TIRZEPATIDE 7.5 MG/0.5ML ~~LOC~~ SOAJ: 7.5000 mg | SUBCUTANEOUS | 0 refills | Status: DC

## 2024-03-21 ENCOUNTER — Other Ambulatory Visit: Payer: Self-pay | Admitting: Gastroenterology

## 2024-03-25 ENCOUNTER — Telehealth: Payer: Self-pay | Admitting: Family Medicine

## 2024-03-25 NOTE — Telephone Encounter (Signed)
 Patient called stating that his hip has really been bothering him. He has tried to stick it out until his appointment with Ortho on 4/28 but it is getting worse and working 8-9 hour days has been making it almost unbearable. He asked if it would be possible for for Dr Alease Hunter to write a work note shortening his day to 6 hours?  Please advise.

## 2024-03-25 NOTE — Telephone Encounter (Signed)
 Letter drafted and sent to pt via My Chart

## 2024-03-26 ENCOUNTER — Encounter: Payer: Self-pay | Admitting: Cardiovascular Disease

## 2024-03-26 NOTE — Progress Notes (Unsigned)
 Cardiology Office Note   Date:  03/27/2024   ID:  Robert Cruz, DOB 1976/11/04, MRN [id number]  PCP:  Abram Abraham, NP-C  Cardiologist:   Ahmad Alert, MD   Chief Complaint  Patient presents with   Hypertension        Hyperlipidemia   Problem List 1. Essential HTN 2. Obstructive sleep apnea 3, GERD    Previous notes:  Robert Cruz is a 48 y.o. male who presents for evaluation of his HTN He has been on multiple meds with minimal response  Very busy at work .    Not a lot of time to exercise  Has been on a better diet for the past 1 year  or so.  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  No DOE doing fairly moderate exercise ( ie no dyspnea walking up the stadium steps at the [employer] a month ago )  Has not lost any weight on his new diet.   Typically break fast - eggs, [place] sausage /turkey bacon  Lunch - subway - 6 inch or salad   - used to eat lots of fried foods, [employer], chick fil a  Dinner - spag. Occasionally take out from [employer]   Apr 05, 2016:  Robert Cruz is seen back today for follow up of his HTN Echo shows normal LV function with moderate LAE  Has tried clonidine  -  Was not effective so it was stopped  Hydralazine  was substituted.  BP is still high  Has tried Bystolic  - caused a severe headache Still drinking lots of sweet tea Lots of juices. Still eats out quite a bit .  Weight is 343 today .  I advised him to lose 5-10 lbs before his next visit .   May 09, 2016  weight is 341 today  BP is elevated.  Has OSA - has been using CPAP , may need some adjustment on the mask gasket  Has been working lots - worked 24 straight days  Avoiding salt    Oct. 18, 2017:  Robert Cruz is seen back for office visit for HTN Has done well on the Aldactone  Feeling better.  Headaches have resolved,   Leg edema has resolved.   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 Still drinking sweet tea.   Wt = 334 ( down 6 lbs from last year)  Having some abd. Pain after eating  Symptoms sound like esophageal stricture /   hiatal hernia / or  GERD  Has had esophageal dilitation in the past.  Has cut out greasy foods.   Does eat some bacon on occasion.   September 26, 2018:  Robert Cruz is seen today for follow-up visit.  He has a history of morbid obesity, hypertension, hyperlipidemia.  His weight today is 345 pounds 11 pounds from last year. Trying to avoid salt .  Is not drinking sweat tea .  Will be seeing a nutritiionist this week .  No CP or dyspnea.  Working for [employer]   February 25, 2020:   Doing well Wt is down 35 lbs from 2019 Is exercising  Some .  Is married  December 11, 2020: Robert Cruz is seen today for follow-up visit.  Vital signs look good.  His weight today is 327 pounds   Jan. 27, 2023 Robert Cruz is seen today for follow up of his obesity, HTN,HLD 339 lbs today   Eating too much bread.  Not exercising   Dec. 21, 2023   Has been having CP ,  thinks it may be GERD ,  No CP with exercise     March 27, 2024 Robert Cruz is seen for follow up of his obesity , HTN, HLD  Wt is 296   Needs to have a hip replacement  No CP , no dyspnea  He is at low risk for his hip surgery    He is on Mounjaro     Past Medical History:  Diagnosis Date   Chicken pox    GERD (gastroesophageal reflux disease)    Gout 2022   Hypertension    Morbid obesity (HCC)    Sleep apnea     History reviewed. No pertinent surgical history.   Current Outpatient Medications  Medication Sig Dispense Refill   allopurinol  (ZYLOPRIM ) 100 MG tablet TAKE 1 TABLET BY MOUTH EVERY DAY 90 tablet 3   alum hydroxide-mag trisilicate (GAVISCON) 80-20 MG CHEW chewable tablet Chew 2 tablets by mouth 4 (four) times daily as needed for indigestion or heartburn.     atenolol  (TENORMIN ) 100  MG tablet Take 1 tablet (100 mg total) by mouth daily. Must keep upcoming appt for refills 30 tablet 0   atorvastatin  (LIPITOR) 40 MG tablet Take 1 tablet (40 mg total) by mouth daily. 15 tablet 0   hydrochlorothiazide  (HYDRODIURIL ) 25 MG tablet TAKE 1 TABLET (25 MG TOTAL) BY MOUTH DAILY. 30 tablet 0   HYDROcodone  bit-homatropine (HYCODAN) 5-1.5 MG/5ML syrup Take 5 mLs by mouth every 8 (eight) hours as needed for cough. 120 mL 0   HYDROcodone -acetaminophen  (NORCO/VICODIN) 5-325 MG tablet Take 1 tablet by mouth every 6 (six) hours as needed. 15 tablet 0   lisinopril  (ZESTRIL ) 20 MG tablet Take 1 tablet (20 mg total) by mouth daily. Must keep upcoming appt for refills 30 tablet 0   omeprazole -sodium bicarbonate  (ZEGERID ) 40-1100 MG capsule TAKE 1 CAPSULE BY MOUTH ONCE DAILY BEFORE BREAKFAST 30 capsule 0   spironolactone  (ALDACTONE ) 100 MG tablet TAKE 1 TABLET BY MOUTH EVERY DAY 90 tablet 2   tirzepatide  (MOUNJARO ) 7.5 MG/0.5ML Pen Inject 7.5 mg into the skin once a week. 6 mL 0   colchicine  0.6 MG tablet TAKE 1 TABLET (0.6 MG TOTAL) BY MOUTH DAILY AS NEEDED (GOUT OR PSUEDOGOUT PAIN). 90 tablet 1   No current facility-administered medications for this visit.    Allergies:   Amlodipine  and Bystolic  [nebivolol  hcl]    Social History:  The patient  reports that he has been smoking cigars. He has never used smokeless tobacco. He reports that he does not currently use alcohol. He reports that he does not use drugs.   Family History:  The patient's family history includes Depression in his father; Healthy in his brother and sister; Hypertension in his father, maternal grandmother, and mother; Kidney disease in his maternal grandmother.    ROS:  Please see the history of present illness.    Physical Exam: Blood pressure 118/88, pulse 92, height 6\' 2"  (1.88 m), weight 296 lb (134.3 kg), SpO2 97%.       GEN:  Well nourished, well developed in no acute distress HEENT: Normal NECK: No JVD; No  carotid bruits LYMPHATICS: No lymphadenopathy CARDIAC: RRR , no murmurs, rubs, gallops RESPIRATORY:  Clear to auscultation without rales, wheezing  or rhonchi  ABDOMEN: Soft, non-tender, non-distended MUSCULOSKELETAL:  No edema; No deformity  SKIN: Warm and dry NEUROLOGIC:  Alert and oriented x 3   EKG:    EKG Interpretation Date/Time:  Wednesday March 27 2024 16:35:29 EDT Ventricular Rate:  94 PR Interval:  170 QRS Duration:  78 QT Interval:  358 QTC Calculation: 447 R Axis:   37  Text Interpretation: Normal sinus rhythm Normal ECG When compared with ECG of 30-Aug-2022 03:04, No significant change since last tracing Confirmed by Ahmad Alert [phone]) on 03/27/2024 5:46:22 PM     Recent Labs: 06/06/2023: ALT 10; BUN 19; Creatinine, Ser 1.42; Hemoglobin 11.3; Platelets 392.0; Potassium 4.4; Sodium 135    Lipid Panel    Component Value Date/Time   CHOL 127 12/02/2022 0737   TRIG 93 12/02/2022 0737   HDL 43 12/02/2022 0737   CHOLHDL 3.0 12/02/2022 0737   CHOLHDL 4 09/04/2020 1704   VLDL 15.6 09/04/2020 1704   LDLCALC 66 12/02/2022 0737      Wt Readings from Last 3 Encounters:  03/27/24 296 lb (134.3 kg)  03/11/24 (!) 304 lb (137.9 kg)  12/22/23 300 lb (136.1 kg)      Other studies Reviewed: Additional studies/ records that were reviewed today include: . Review of the above records demonstrates:    ASSESSMENT AND PLAN:  1.  Essential HTN-  .  BP is well controlled.  Continue current medications.  2. Obesity : He is currently on Mounjaro .  He is working on weight loss.  2. Leg edema -    3. Hyperlipidemia: Continue atorvastatin .  Will check labs tomorrow.  4. Obstructive sleep apnea: 5.  CP :   Current medicines are reviewed at length with the patient today.  The patient does not have concerns regarding medicines.    Labs/ tests ordered today include:   Orders Placed This Encounter  Procedures   EKG 12-Lead    Disposition:       Ahmad Alert, MD   03/27/2024 4:56 PM    [facility] [address] [place], [place], [place]  [address] Phone: [phone]; Fax: [phone]

## 2024-03-27 ENCOUNTER — Ambulatory Visit: Attending: Cardiovascular Disease | Admitting: Cardiovascular Disease

## 2024-03-27 ENCOUNTER — Encounter: Payer: Self-pay | Admitting: Cardiovascular Disease

## 2024-03-27 VITALS — BP 118/88 | HR 92 | Ht 74.0 in | Wt 296.0 lb

## 2024-03-27 DIAGNOSIS — I10 Essential (primary) hypertension: Secondary | ICD-10-CM | POA: Diagnosis not present

## 2024-03-27 DIAGNOSIS — E782 Mixed hyperlipidemia: Secondary | ICD-10-CM

## 2024-03-27 DIAGNOSIS — Z79899 Other long term (current) drug therapy: Secondary | ICD-10-CM | POA: Diagnosis not present

## 2024-03-27 MED ORDER — ATORVASTATIN CALCIUM 40 MG PO TABS: 40.0000 mg | ORAL_TABLET | Freq: Every day | ORAL | 3 refills | Status: AC

## 2024-03-27 MED ORDER — ATENOLOL 100 MG PO TABS
100.0000 mg | ORAL_TABLET | Freq: Every day | ORAL | 3 refills | Status: AC
Start: 2024-03-27 — End: ?

## 2024-03-27 MED ORDER — HYDROCHLOROTHIAZIDE 25 MG PO TABS
25.0000 mg | ORAL_TABLET | Freq: Every day | ORAL | 3 refills | Status: AC
Start: 2024-03-27 — End: ?

## 2024-03-27 NOTE — Patient Instructions (Signed)
 Medication Instructions:  REFILLED Cardiac medications  Lab Work: Lipids, ALT, BMET tomorrow If you have labs (blood work) drawn today and your tests are completely normal, you will receive your results only by: MyChart Message (if you have MyChart) OR A paper copy in the mail If you have any lab test that is abnormal or we need to change your treatment, we will call you to review the results.  Follow-Up: At [facility], you and your health needs are our priority.  As part of our continuing mission to provide you with exceptional heart care, our providers are all part of one team.  This team includes your primary Cardiologist (physician) and Advanced Practice Providers or APPs (Physician Assistants and Nurse Practitioners) who all work together to provide you with the care you need, when you need it.  Your next appointment:   1 year(s)  Provider:   Ahmad Alert, MD        1st Floor: - Lobby - Registration  - Pharmacy  - Lab - Cafe  2nd Floor: - PV Lab - Diagnostic Testing (echo, CT, nuclear med)  3rd Floor: - Vacant  4th Floor: - TCTS (cardiothoracic surgery) - AFib Clinic - Structural Heart Clinic - Vascular Surgery  - Vascular Ultrasound  5th Floor: - HeartCare Cardiology (general and EP) - Clinical Pharmacy for coumadin, hypertension, lipid, weight-loss medications, and med management appointments    Valet parking services will be available as well.

## 2024-03-28 LAB — LIPID PANEL
Chol/HDL Ratio: 3.9 ratio (ref 0.0–5.0)
Cholesterol, Total: 154 mg/dL (ref 100–199)
HDL: 40 mg/dL (ref >39)
LDL Chol Calc (NIH): 98 mg/dL (ref 0–99)
Triglycerides: 85 mg/dL (ref 0–149)
VLDL Cholesterol Cal: 16 mg/dL (ref 5–40)

## 2024-03-28 LAB — BASIC METABOLIC PANEL WITH GFR
BUN/Creatinine Ratio: 16 (ref 9–20)
BUN: 21 mg/dL (ref 6–24)
CO2: 18 mmol/L — ABNORMAL LOW (ref 20–29)
Calcium: 9.4 mg/dL (ref 8.7–10.2)
Chloride: 100 mmol/L (ref 96–106)
Creatinine, Ser: 1.34 mg/dL — ABNORMAL HIGH (ref 0.76–1.27)
Glucose: 108 mg/dL — ABNORMAL HIGH (ref 70–99)
Potassium: 4.3 mmol/L (ref 3.5–5.2)
Sodium: 135 mmol/L (ref 134–144)
eGFR: 65 mL/min/{1.73_m2} (ref >59)

## 2024-03-28 LAB — ALT: ALT: 15 IU/L (ref 0–44)

## 2024-04-01 ENCOUNTER — Encounter: Payer: Self-pay | Admitting: Cardiovascular Disease

## 2024-04-01 ENCOUNTER — Ambulatory Visit (INDEPENDENT_AMBULATORY_CARE_PROVIDER_SITE_OTHER): Admitting: Orthopaedic Surgery

## 2024-04-01 VITALS — Ht 74.0 in | Wt 296.0 lb

## 2024-04-01 DIAGNOSIS — M1611 Unilateral primary osteoarthritis, right hip: Secondary | ICD-10-CM | POA: Insufficient documentation

## 2024-04-01 DIAGNOSIS — M25551 Pain in right hip: Secondary | ICD-10-CM | POA: Diagnosis not present

## 2024-04-01 MED ORDER — METHOCARBAMOL 500 MG PO TABS: 500.0000 mg | ORAL_TABLET | Freq: Four times a day (QID) | ORAL | 1 refills | Status: DC | PRN

## 2024-04-01 MED ORDER — TRAMADOL HCL 50 MG PO TABS: 50.0000 mg | ORAL_TABLET | Freq: Two times a day (BID) | ORAL | 0 refills | Status: DC | PRN

## 2024-04-01 NOTE — Progress Notes (Signed)
 The patient is a 48 year old gentleman that I am seeing for the first time.  He was referred from Dr. Garlan Juniper to evaluate and treat known arthritis of his right hip.  He actually has x-rays on the canopy system for us  to go over with him.  He reports daily and severe right hip and groin pain.  He has had injections of steroid in the right hip that only helped minimally.  He says the right hip gives out on him and it does wake him up at night.  He has been sitting for long period time or driving for a while and he gets up his hip is very painful and stiff.  He denies any issues with his left hip.  He is someone that has a BMI of 38.  It is highest weight he weighed well over 350 pounds or more.  He has been on a weight loss journey.  He is not a diabetic.  He does have a higher creatinine so it is not really prudent for him at this point to take any oral anti-inflammatories.  Diclofenac  has helped some.  At this point his hip pain is detrimentally affecting his mobility, his quality of life and his activities daily living and is 10 out of 10.  He was put on light duty work appropriately but his job wants him to be out of work completely given the nature of his physical demands of that job.  On exam his left hip moves smoothly and fluidly and has no blocks or rotation and no pain in the groin.  His right hip which is painful is very stiff with limitations in rotation and severe pain in the groin with rotation.  X-rays on the canopy system showing his pelvis and right hip show complete loss of joint space at the superior lateral weightbearing aspect of the hip with bone-on-bone wear as well as flattening of the femoral head and osteophytes.  We had a long and thorough discussion about hip replacement surgery.  I showed him a hip replacement model and gave him a handout about hip replacement surgery.  We discussed the risk and benefits of the surgery and what to expect from an intraoperative and postoperative  standpoint.  All question concerns were answered and addressed.  I agree with him being out of work completely until the time of surgery and for up to 6 to 8 weeks after surgery as he recovers.  Since he cannot take anti-inflammatories I will send in a short course of tramadol  and Robaxin  to see if this will help him but I told him I would not provide anything stronger until after surgery.  He agrees with this as well.  All questions and concerns were answered and addressed.  Will be in touch with scheduling him for a right total hip replacement.

## 2024-04-03 ENCOUNTER — Telehealth: Payer: Self-pay | Admitting: Orthopaedic Surgery

## 2024-04-03 NOTE — Telephone Encounter (Signed)
 Pt came in and filled out a release form and paid a fee of $20.00 by card for (FMLA/Disability forms that were faxed in).  Release form was placed in tray for Tammy H to pick up.

## 2024-04-15 NOTE — Progress Notes (Signed)
 COVID Vaccine received:  [x]  No []  Yes Date of any COVID positive Test in last 90 days:  none  PCP - Alyson Back, NP-C at [facility]  Cardiologist - Ahmad Alert, MD  cardiac clearance in 03-27-24 Epic note  Chest x-ray - 08-17-2023  2v Epic EKG -  03-27-2024  Epic Stress Test -  ECHO - 07-28-2020  Epic Cardiac Cath -   Pacemaker / ICD device [x]  No []  Yes   Spinal Cord Stimulator:[x]  No []  Yes       History of Sleep Apnea? []  No [x]  Yes   (study 2017) CPAP used?- []  No [x]  Yes    Does the patient monitor blood sugar?   []  N/A   [x]  No []  Yes  Patient has: []  NO Hx DM   [x]  Pre-DM   []  DM1  []   DM2 Last A1c was: 5.9  on   06-06-23   MOUNJARO - Sunday,  Last injection was 04-15-2023  Patient is aware.   Blood Thinner / Instructions: none Aspirin  Instructions:  none  ERAS Protocol Ordered: []  No  [x]  Yes PRE-SURGERY []  ENSURE  [x]  G2  Patient is to be NPO after: 0430  Dental hx: []  Dentures:  []  N/A      [x]  Bridge or Partial: Partials in the front,                   []  Loose or Damaged teeth:   Comments: Patient was given the 5 CHG shower / bath instructions for THA surgery along with 2 bottles of the CHG soap. Patient will start this on:  Sunday 04-21-2024  Activity level: Patient is able  to climb a flight of stairs without difficulty; [x]  No CP  [x]  No SOB, but would have leg pain Patient can perform ADLs without assistance.   Anesthesia review:Pre-DM, GERD, HTN, Migraines, CKD3, some days cigars.  OSA-CPAP  Patient denies shortness of breath, fever, cough and chest pain at PAT appointment.  Patient verbalized understanding and agreement to the Pre-Surgical Instructions that were given to them at this PAT appointment. Patient was also educated of the need to review these PAT instructions again prior to his surgery.I reviewed the appropriate phone numbers to call if they have any and questions or concerns.

## 2024-04-15 NOTE — Patient Instructions (Signed)
 SURGICAL WAITING ROOM VISITATION Patients having surgery or a procedure may have no more than 2 support people in the waiting area - these visitors may rotate in the visitor waiting room.   If the patient needs to stay at the hospital during part of their recovery, the visitor guidelines for inpatient rooms apply.  PRE-OP VISITATION  Pre-op nurse will coordinate an appropriate time for 1 support person to accompany the patient in pre-op.  This support person may not rotate.  This visitor will be contacted when the time is appropriate for the visitor to come back in the pre-op area.  Please refer to the [facility] website for the visitor guidelines for Inpatients (after your surgery is over and you are in a regular room).  You are not required to quarantine at this time prior to your surgery. However, you must do this: Hand Hygiene often Do NOT share personal items Notify your provider if you are in close contact with someone who has COVID or you develop fever 100.4 or greater, new onset of sneezing, cough, sore throat, shortness of breath or body aches.  If you test positive for Covid or have been in contact with anyone that has tested positive in the last 10 days please notify you surgeon.    Your procedure is scheduled on:  THURSDAY  Apr 25, 2024  Report to [facility] Main Entrance: [address] entrance where the [employer] is available.   Report to admitting at: 05:15    AM  Call this number if you have any questions or problems the morning of surgery [phone]  Do not eat food after Midnight the night prior to your surgery/procedure.  After Midnight you may have the following liquids until  04:30 AM DAY OF SURGERY  Clear Liquid Diet Water Black Coffee (sugar ok, NO MILK/CREAM OR CREAMERS)  Tea (sugar ok, NO MILK/CREAM OR CREAMERS) regular and decaf                             Plain Jell-O  with no fruit (NO RED)                                           Fruit ices  (not with fruit pulp, NO RED)                                     Popsicles (NO RED)                                                                  Juice: NO CITRUS JUICES: only apple, WHITE grape, WHITE cranberry Sports drinks like Gatorade or Powerade (NO RED)                   The day of surgery:  Drink ONE (1) Pre-Surgery G2 at  04:30 AM the morning of surgery. Drink in one sitting. Do not sip.  This drink was given to you during your hospital pre-op appointment visit. Nothing else to drink after completing  the Pre-Surgery G2 : No candy, chewing gum or throat lozenges.    FOLLOW ANY ADDITIONAL PRE OP INSTRUCTIONS YOU RECEIVED FROM YOUR SURGEON'S OFFICE!!!   Oral Hygiene is also important to reduce your risk of infection.        Remember - BRUSH YOUR TEETH THE MORNING OF SURGERY WITH YOUR REGULAR TOOTHPASTE  Do NOT smoke after Midnight the night before surgery.  STOP TAKING all Vitamins, Herbs and supplements 1 week before your surgery.   MOUNJARO - Last injection was Sunday Apr 14, 2024.  DO NOT inject again until AFTER your surgery.   Take ONLY these medicines the morning of surgery with A SIP OF WATER: Omeprazole - Sodium Bicarb (Zegerid ), Allopurinol , atenolol , and Tramadol  if needed for pain.    DO NOT TAKE: Lisinopril , spironolactone  or Furosemide the morning of your surgery. You may take them the day BEFORE surgery.    If You have been diagnosed with Sleep Apnea - Bring CPAP mask and tubing day of surgery. We will provide you with a CPAP machine on the day of your surgery.                   You may not have any metal on your body including jewelry, and body piercing  Do not wear  lotions, powders,  cologne, or deodorant  Men may shave face and neck.  Contacts, Hearing Aids, dentures or bridgework may not be worn into surgery. DENTURES WILL BE REMOVED PRIOR TO SURGERY PLEASE DO NOT APPLY "Poly grip" OR ADHESIVES!!!  You may bring a small overnight bag with you on the  day of surgery, only pack items that are not valuable. [facility] IS NOT RESPONSIBLE   FOR VALUABLES THAT ARE LOST OR STOLEN.   Do not bring your home medications to the hospital. The Pharmacy will dispense medications listed on your medication list to you during your admission in the Hospital.  Please read over the following fact sheets you were given: IF YOU HAVE QUESTIONS ABOUT YOUR PRE-OP INSTRUCTIONS, PLEASE CALL [phone].     Pre-operative 5 CHG Bath Instructions   You can play a key role in reducing the risk of infection after surgery. Your skin needs to be as free of germs as possible. You can reduce the number of germs on your skin by washing with CHG (chlorhexidine gluconate) soap before surgery. CHG is an antiseptic soap that kills germs and continues to kill germs even after washing.   DO NOT use if you have an allergy to chlorhexidine/CHG or antibacterial soaps. If your skin becomes reddened or irritated, stop using the CHG and notify one of our RNs at [phone]  Please shower with the CHG soap starting 4 days before surgery using the following schedule: START SHOWERS ON   SUNDAY  04-21-2024  Please keep in mind the following:  DO NOT shave, including legs and underarms, starting the day of your first shower.   You may shave your face at any point before/day of surgery.   Place clean sheets on your bed the day you start using CHG soap. Use a clean washcloth (not used since being washed) for each shower. DO NOT sleep with pets once you start using the CHG.   CHG Shower Instructions:  If you choose to wash your hair and private area, wash first with your normal shampoo/soap.  After you use shampoo/soap, rinse your hair and body thoroughly to remove shampoo/soap residue.  Turn the water OFF and apply  about 3 tablespoons (45 ml) of CHG soap to a CLEAN washcloth.  Apply CHG soap ONLY FROM YOUR NECK DOWN TO YOUR TOES (washing for 3-5 minutes)  DO NOT use CHG soap on face, private areas, open wounds, or sores.  Pay special attention to the area where your surgery is being performed.  If you are having back surgery, having someone wash your back for you may be helpful.  Wait 2 minutes after CHG soap is applied, then you may rinse off the CHG soap.  Pat dry with a clean towel  Put on clean clothes/pajamas   If you choose to wear lotion, please use ONLY the CHG-compatible lotions on the back of this paper.     Additional instructions for the day of surgery: DO NOT APPLY any lotions, deodorants, cologne, or perfumes.   Put on clean/comfortable clothes.  Brush your teeth.  Ask your nurse before applying any prescription medications to the skin.      CHG Compatible Lotions   Aveeno Moisturizing lotion  Cetaphil Moisturizing Cream  Cetaphil Moisturizing Lotion  Clairol Herbal Essence Moisturizing Lotion, Dry Skin  Clairol Herbal Essence Moisturizing Lotion, Extra Dry Skin  Clairol Herbal Essence Moisturizing Lotion, Normal Skin  Curel Age Defying Therapeutic Moisturizing Lotion with Alpha Hydroxy  Curel Extreme Care Body Lotion  Curel Soothing Hands Moisturizing Hand Lotion  Curel Therapeutic Moisturizing Cream, Fragrance-Free  Curel Therapeutic Moisturizing Lotion, Fragrance-Free  Curel Therapeutic Moisturizing Lotion, Original Formula  Eucerin Daily Replenishing Lotion  Eucerin Dry Skin Therapy Plus Alpha Hydroxy Crme  Eucerin Dry Skin Therapy Plus Alpha Hydroxy Lotion  Eucerin Original Crme  Eucerin Original Lotion  Eucerin Plus Crme Eucerin Plus Lotion  Eucerin TriLipid Replenishing Lotion  Keri Anti-Bacterial Hand Lotion  Keri Deep Conditioning Original Lotion Dry Skin Formula Softly Scented  Keri Deep Conditioning Original Lotion, Fragrance Free Sensitive Skin Formula   Keri Lotion Fast Absorbing Fragrance Free Sensitive Skin Formula  Keri Lotion Fast Absorbing Softly Scented Dry Skin Formula  Keri Original Lotion  Keri Skin Renewal Lotion Keri Silky Smooth Lotion  Keri Silky Smooth Sensitive Skin Lotion  Nivea Body Creamy Conditioning Oil  Nivea Body Extra Enriched Lotion  Nivea Body Original Lotion  Nivea Body Sheer Moisturizing Lotion Nivea Crme  Nivea Skin Firming Lotion  NutraDerm 30 Skin Lotion  NutraDerm Skin Lotion  NutraDerm Therapeutic Skin Cream  NutraDerm Therapeutic Skin Lotion  ProShield Protective Hand Cream  Provon moisturizing lotion   FAILURE TO FOLLOW THESE INSTRUCTIONS MAY RESULT IN THE CANCELLATION OF YOUR SURGERY  PATIENT SIGNATURE_________________________________  NURSE SIGNATURE__________________________________  ________________________________________________________________________        Robert Cruz    An incentive spirometer is a tool that can help keep your lungs clear and active. This tool measures how well you are filling your lungs with each breath.  Taking long deep breaths may help reverse or decrease the chance of developing breathing (pulmonary) problems (especially infection) following: A long period of time when you are unable to move or be active. BEFORE THE PROCEDURE  If the spirometer includes an indicator to show your best effort, your nurse or respiratory therapist will set it to a desired goal. If possible, sit up straight or lean slightly forward. Try not to slouch. Hold the incentive spirometer in an upright position. INSTRUCTIONS FOR USE  Sit on the edge of your bed if possible, or sit up as far as you can in bed or on a chair. Hold the incentive spirometer in an upright position. Breathe out normally. Place the mouthpiece in your mouth and seal your lips tightly around it. Breathe in slowly and as deeply as possible, raising the piston or the ball toward the top of the  column. Hold your breath for 3-5 seconds or for as long as possible. Allow the piston or ball to fall to the bottom of the column. Remove the mouthpiece from your mouth and breathe out normally. Rest for a few seconds and repeat Steps 1 through 7 at least 10 times every 1-2 hours when you are awake. Take your time and take a few normal breaths between deep breaths. The spirometer may include an indicator to show your best effort. Use the indicator as a goal to work toward during each repetition. After each set of 10 deep breaths, practice coughing to be sure your lungs are clear. If you have an incision (the cut made at the time of surgery), support your incision when coughing by placing a pillow or rolled up towels firmly against it. Once you are able to get out of bed, walk around indoors and cough well. You may stop using the incentive spirometer when instructed by your caregiver.  RISKS AND COMPLICATIONS Take your time so you do not get dizzy or light-headed. If you are in pain, you may need to take or ask for pain medication before doing incentive spirometry. It is harder to take a deep breath if you are having pain. AFTER USE Rest and breathe slowly and easily. It can be helpful to keep track of a log of your progress. Your caregiver can provide you with a simple table to help with this. If you are using the spirometer at home, follow these instructions: SEEK MEDICAL CARE IF:  You are having difficultly using the spirometer. You have trouble using the spirometer as often as instructed. Your pain medication is not giving enough relief while using the spirometer. You develop fever of 100.5 F (38.1 C) or higher.                                                                                                    SEEK IMMEDIATE MEDICAL CARE IF:  You cough up bloody sputum that had not been present before. You develop fever of 102 F (38.9 C) or greater. You develop worsening pain at or near  the incision site. MAKE SURE YOU:  Understand these instructions. Will watch your  condition. Will get help right away if you are not doing well or get worse. Document Released: 04/03/2007 Document Revised: 02/13/2012 Document Reviewed: 06/04/2007 [facility] Patient Information 2014 [place], [place].      WHAT IS A BLOOD TRANSFUSION? Blood Transfusion Information  A transfusion is the replacement of blood or some of its parts. Blood is made up of multiple cells which provide different functions. Red blood cells carry oxygen and are used for blood loss replacement. White blood cells fight against infection. Platelets control bleeding. Plasma helps clot blood. Other blood products are available for specialized needs, such as hemophilia or other clotting disorders. BEFORE THE TRANSFUSION  Who gives blood for transfusions?  Healthy volunteers who are fully evaluated to make sure their blood is safe. This is blood bank blood. Transfusion therapy is the safest it has ever been in the practice of medicine. Before blood is taken from a donor, a complete history is taken to make sure that person has no history of diseases nor engages in risky social behavior (examples are intravenous drug use or sexual activity with multiple partners). The donor's travel history is screened to minimize risk of transmitting infections, such as malaria. The donated blood is tested for signs of infectious diseases, such as HIV and hepatitis. The blood is then tested to be sure it is compatible with you in order to minimize the chance of a transfusion reaction. If you or a relative donates blood, this is often done in anticipation of surgery and is not appropriate for emergency situations. It takes many days to process the donated blood. RISKS AND COMPLICATIONS Although transfusion therapy is very safe and saves many lives, the main dangers of transfusion include:  Getting an infectious disease. Developing a transfusion  reaction. This is an allergic reaction to something in the blood you were given. Every precaution is taken to prevent this. The decision to have a blood transfusion has been considered carefully by your caregiver before blood is given. Blood is not given unless the benefits outweigh the risks. AFTER THE TRANSFUSION Right after receiving a blood transfusion, you will usually feel much better and more energetic. This is especially true if your red blood cells have gotten low (anemic). The transfusion raises the level of the red blood cells which carry oxygen, and this usually causes an energy increase. The nurse administering the transfusion will monitor you carefully for complications. HOME CARE INSTRUCTIONS  No special instructions are needed after a transfusion. You may find your energy is better. Speak with your caregiver about any limitations on activity for underlying diseases you may have. SEEK MEDICAL CARE IF:  Your condition is not improving after your transfusion. You develop redness or irritation at the intravenous (IV) site. SEEK IMMEDIATE MEDICAL CARE IF:  Any of the following symptoms occur over the next 12 hours: Shaking chills. You have a temperature by mouth above 102 F (38.9 C), not controlled by medicine. Chest, back, or muscle pain. People around you feel you are not acting correctly or are confused. Shortness of breath or difficulty breathing. Dizziness and fainting. You get a rash or develop hives. You have a decrease in urine output. Your urine turns a dark color or changes to pink, red, or brown. Any of the following symptoms occur over the next 10 days: You have a temperature by mouth above 102 F (38.9 C), not controlled by medicine. Shortness of breath. Weakness after normal activity. The white part of the eye turns yellow (jaundice). You have a  decrease in the amount of urine or are urinating less often. Your urine turns a dark color or changes to pink, red, or  brown. Document Released: 11/18/2000 Document Revised: 02/13/2012 Document Reviewed: 07/07/2008 [facility] Patient Information 2014 [place], [place].  _______________________________________________________________________          If you would like to see a video about joint replacement:   [email or web address]

## 2024-04-16 ENCOUNTER — Encounter (HOSPITAL_COMMUNITY): Payer: Self-pay

## 2024-04-16 ENCOUNTER — Encounter (HOSPITAL_COMMUNITY)
Admission: RE | Admit: 2024-04-16 | Discharge: 2024-04-16 | Disposition: A | Discharge status: Home / Self Care | Source: Ambulatory Visit | Attending: Orthopaedic Surgery | Admitting: Orthopaedic Surgery

## 2024-04-16 ENCOUNTER — Other Ambulatory Visit: Payer: Self-pay

## 2024-04-16 VITALS — BP 107/78 | HR 90 | Temp 98.7°F | Resp 16 | Ht 74.0 in | Wt 282.0 lb

## 2024-04-16 DIAGNOSIS — M1611 Unilateral primary osteoarthritis, right hip: Secondary | ICD-10-CM | POA: Diagnosis not present

## 2024-04-16 DIAGNOSIS — Z01812 Encounter for preprocedural laboratory examination: Secondary | ICD-10-CM | POA: Insufficient documentation

## 2024-04-16 DIAGNOSIS — Z01818 Encounter for other preprocedural examination: Secondary | ICD-10-CM

## 2024-04-16 DIAGNOSIS — M25522 Pain in left elbow: Secondary | ICD-10-CM

## 2024-04-16 DIAGNOSIS — R7303 Prediabetes: Secondary | ICD-10-CM

## 2024-04-16 DIAGNOSIS — I1A0 Resistant hypertension: Secondary | ICD-10-CM | POA: Insufficient documentation

## 2024-04-16 HISTORY — DX: Chronic kidney disease, unspecified: N18.9

## 2024-04-16 HISTORY — DX: Unspecified osteoarthritis, unspecified site: M19.90

## 2024-04-16 HISTORY — DX: Prediabetes: R73.03

## 2024-04-16 LAB — CBC
HCT: 42.5 % (ref 39.0–52.0)
Hemoglobin: 13.5 g/dL (ref 13.0–17.0)
MCH: 26.9 pg (ref 26.0–34.0)
MCHC: 31.8 g/dL (ref 30.0–36.0)
MCV: 84.7 fL (ref 80.0–100.0)
Platelets: 426 10*3/uL — ABNORMAL HIGH (ref 150–400)
RBC: 5.02 MIL/uL (ref 4.22–5.81)
RDW: 14.7 % (ref 11.5–15.5)
WBC: 8.2 10*3/uL (ref 4.0–10.5)
nRBC: 0 % (ref 0.0–0.2)

## 2024-04-16 LAB — BASIC METABOLIC PANEL WITH GFR
Anion gap: 7 (ref 5–15)
BUN: 16 mg/dL (ref 6–20)
CO2: 23 mmol/L (ref 22–32)
Calcium: 9.3 mg/dL (ref 8.9–10.3)
Chloride: 102 mmol/L (ref 98–111)
Creatinine, Ser: 1.28 mg/dL — ABNORMAL HIGH (ref 0.61–1.24)
GFR, Estimated: >60 mL/min (ref >60)
Glucose, Bld: 93 mg/dL (ref 70–99)
Potassium: 4 mmol/L (ref 3.5–5.1)
Sodium: 132 mmol/L — ABNORMAL LOW (ref 135–145)

## 2024-04-16 LAB — SURGICAL PCR SCREEN
MRSA, PCR: NEGATIVE
Staphylococcus aureus: NEGATIVE

## 2024-04-18 ENCOUNTER — Other Ambulatory Visit: Payer: Self-pay | Admitting: Cardiovascular Disease

## 2024-04-18 ENCOUNTER — Other Ambulatory Visit: Payer: Self-pay

## 2024-04-18 DIAGNOSIS — Z96641 Presence of right artificial hip joint: Secondary | ICD-10-CM

## 2024-04-22 ENCOUNTER — Other Ambulatory Visit: Payer: Self-pay | Admitting: Gastroenterology

## 2024-04-24 NOTE — H&P (Signed)
 TOTAL HIP ADMISSION H&P  Patient is admitted for right total hip arthroplasty.  Subjective:  Chief Complaint: right hip pain  HPI: Robert Cruz, 48 y.o. male, has a history of pain and functional disability in the right hip(s) due to arthritis and patient has failed non-surgical conservative treatments for greater than 12 weeks to include NSAID's and/or analgesics, corticosteriod injections, flexibility and strengthening excercises, weight reduction as appropriate, and activity modification.  Onset of symptoms was gradual starting 2 years ago with gradually worsening course since that time.The patient noted no past surgery on the right hip(s).  Patient currently rates pain in the right hip at 10 out of 10 with activity. Patient has night pain, worsening of pain with activity and weight bearing, trendelenberg gait, pain that interfers with activities of daily living, and pain with passive range of motion. Patient has evidence of subchondral sclerosis, periarticular osteophytes, and joint space narrowing by imaging studies. This condition presents safety issues increasing the risk of falls.  There is no current active infection.  Patient Active Problem List   Diagnosis Date Noted   Unilateral primary osteoarthritis, right hip 04/01/2024   URI, acute 08/17/2023   Obesity (BMI 30-39.9) 08/04/2023   Type 2 diabetes mellitus with chronic kidney disease, without long-term current use of insulin (HCC) 08/04/2023   Traumatic complete tear of left rotator cuff 04/12/2023   Traumatic complete tear of right rotator cuff 04/12/2023   Lower respiratory infection 10/04/2022   Right Achilles tendinitis 06/03/2022   Chronic gout 03/30/2022   Diarrhea 12/16/2021   Stage 3a chronic kidney disease (HCC) 09/01/2021   Left elbow pain 10/18/2019   Acute cough 10/03/2019   Mixed hyperlipidemia 09/08/2017   Allergic rhinitis 10/11/2016   Chronic migraine without aura without status migrainosus, not intractable  01/14/2016   Resistant hypertension 01/04/2016   Generalized headache 12/16/2015   Colon cancer screening 09/06/2013   GERD (gastroesophageal reflux disease) 09/06/2013   Dysphagia 02/08/2011   OSA (obstructive sleep apnea) 12/28/2008   Severe obesity (BMI >= 40) (HCC) 10/07/2008   Past Medical History:  Diagnosis Date   Arthritis    gout and osteoarthritis   Chicken pox    Chronic kidney disease    GERD (gastroesophageal reflux disease)    Gout 2022   Hypertension    Morbid obesity (HCC)    Pre-diabetes    Sleep apnea    uses CPAP    Past Surgical History:  Procedure Laterality Date   COLONOSCOPY W/ BIOPSIES     ESOPHAGOGASTRODUODENOSCOPY     x 3    No current facility-administered medications for this encounter.   Current Outpatient Medications  Medication Sig Dispense Refill Last Dose/Taking   allopurinol  (ZYLOPRIM ) 100 MG tablet Take 200 mg by mouth in the morning.   Taking   alum hydroxide-mag trisilicate (GAVISCON) 80-20 MG CHEW chewable tablet Chew 2 tablets by mouth 4 (four) times daily as needed for indigestion or heartburn.   Taking As Needed   atenolol  (TENORMIN ) 100 MG tablet Take 1 tablet (100 mg total) by mouth daily. Must keep upcoming appt for refills 90 tablet 3 Taking   atorvastatin  (LIPITOR) 40 MG tablet Take 1 tablet (40 mg total) by mouth daily. 90 tablet 3 Taking   hydrochlorothiazide  (HYDRODIURIL ) 25 MG tablet Take 1 tablet (25 mg total) by mouth daily. 90 tablet 3 Taking   lisinopril  (ZESTRIL ) 20 MG tablet Take 1 tablet (20 mg total) by mouth daily. Must keep upcoming appt for refills 30 tablet  0 Taking   methocarbamol  (ROBAXIN ) 500 MG tablet Take 1 tablet (500 mg total) by mouth every 6 (six) hours as needed. 40 tablet 1 Taking As Needed   tirzepatide  (MOUNJARO ) 7.5 MG/0.5ML Pen Inject 7.5 mg into the skin once a week. (Patient taking differently: Inject 7.5 mg into the skin every Sunday.) 6 mL 0 Taking Differently   traMADol  (ULTRAM ) 50 MG tablet  Take 1-2 tablets (50-100 mg total) by mouth every 12 (twelve) hours as needed. 30 tablet 0 Taking As Needed   omeprazole -sodium bicarbonate  (ZEGERID ) 40-1100 MG capsule TAKE 1 CAPSULE BY MOUTH ONCE DAILY BEFORE BREAKFAST 30 capsule 3    spironolactone  (ALDACTONE ) 100 MG tablet TAKE 1 TABLET BY MOUTH EVERY DAY 90 tablet 3    Allergies  Allergen Reactions   Amlodipine  Other (See Comments)    edema   Bystolic  [Nebivolol  Hcl] Other (See Comments)    Patient stated medication gave him major headaches    Social History   Tobacco Use   Smoking status: Some Days    Types: Cigars   Smokeless tobacco: Never  Substance Use Topics   Alcohol use: Not Currently    Comment: seldom    Family History  Problem Relation Age of Onset   Hypertension Father    Depression Father    Hypertension Mother    Kidney disease Maternal Grandmother        Dialysis   Hypertension Maternal Grandmother    Healthy Brother        x1   Healthy Sister        x3   Colon cancer Neg Hx    Esophageal cancer Neg Hx    Rectal cancer Neg Hx    Stomach cancer Neg Hx      Review of Systems  Objective:  Physical Exam Vitals reviewed.  Constitutional:      Appearance: Normal appearance. He is obese.  HENT:     Head: Normocephalic and atraumatic.  Eyes:     Extraocular Movements: Extraocular movements intact.     Pupils: Pupils are equal, round, and reactive to light.  Cardiovascular:     Rate and Rhythm: Normal rate and regular rhythm.  Pulmonary:     Effort: Pulmonary effort is normal.     Breath sounds: Normal breath sounds.  Abdominal:     Palpations: Abdomen is soft.  Musculoskeletal:     Cervical back: Normal range of motion and neck supple.     Right hip: Tenderness and bony tenderness present. Decreased range of motion. Decreased strength.  Neurological:     Mental Status: He is alert.  Psychiatric:        Behavior: Behavior normal.     Vital signs in last 24 hours:     Labs:   Estimated body mass index is 36.21 kg/m as calculated from the following:   Height as of 04/16/24: 6\' 2"  (1.88 m).   Weight as of 04/16/24: 127.9 kg.   Imaging Review Plain radiographs demonstrate severe degenerative joint disease of the right hip(s). The bone quality appears to be excellent for age and reported activity level.      Assessment/Plan:  End stage arthritis, right hip(s)  The patient history, physical examination, clinical judgement of the provider and imaging studies are consistent with end stage degenerative joint disease of the right hip(s) and total hip arthroplasty is deemed medically necessary. The treatment options including medical management, injection therapy, arthroscopy and arthroplasty were discussed at length. The risks and benefits  of total hip arthroplasty were presented and reviewed. The risks due to aseptic loosening, infection, stiffness, dislocation/subluxation,  thromboembolic complications and other imponderables were discussed.  The patient acknowledged the explanation, agreed to proceed with the plan and consent was signed. Patient is being admitted for inpatient treatment for surgery, pain control, PT, OT, prophylactic antibiotics, VTE prophylaxis, progressive ambulation and ADL's and discharge planning.The patient is planning to be discharged home with home health services

## 2024-04-24 NOTE — Anesthesia Preprocedure Evaluation (Addendum)
 Anesthesia Evaluation  Patient identified by MRN, date of birth, ID band Patient awake    Reviewed: Allergy & Precautions, NPO status , Patient's Chart, lab work & pertinent test results  History of Anesthesia Complications Negative for: history of anesthetic complications  Airway Mallampati: III  TM Distance: >3 FB Neck ROM: Full    Dental  (+) Dental Advisory Given,    Pulmonary neg shortness of breath, sleep apnea and Continuous Positive Airway Pressure Ventilation , neg COPD, neg recent URI, Current Smoker   Pulmonary exam normal breath sounds clear to auscultation       Cardiovascular hypertension (atenolol , HCTZ, spironolactone ), Pt. on home beta blockers (-) angina (-) Past MI, (-) Cardiac Stents and (-) CABG (-) dysrhythmias  Rhythm:Regular Rate:Normal  HLD  TTE 07/28/2020: IMPRESSIONS    1. Left ventricular ejection fraction, by estimation, is 55 to 60%. The  left ventricle has normal function. The left ventricle has no regional  wall motion abnormalities. Left ventricular diastolic parameters are  consistent with Grade II diastolic  dysfunction (pseudonormalization).   2. Right ventricular systolic function is normal. The right ventricular  size is normal. Tricuspid regurgitation signal is inadequate for assessing  PA pressure.   3. The mitral valve is normal in structure. Trivial mitral valve  regurgitation. No evidence of mitral stenosis.   4. The aortic valve is tricuspid. Aortic valve regurgitation is not  visualized. No aortic stenosis is present.   5. The inferior vena cava is normal in size with greater than 50%  respiratory variability, suggesting right atrial pressure of 3 mmHg.   6. Negative bubble study, no evidence for PFO or ASD.     Neuro/Psych  Headaches, neg Seizures TIA (patient reports he doesn't think he actually had a TIA, he thinks he was drunk during that episode)   GI/Hepatic Neg liver  ROS,GERD  Medicated,,  Endo/Other  diabetes, Type 2  Class 3 obesity  Renal/GU CRFRenal disease     Musculoskeletal  (+) Arthritis , Osteoarthritis,    Abdominal  (+) + obese  Peds  Hematology negative hematology ROS (+) Lab Results      Component                Value               Date                      WBC                      8.2                 04/16/2024                HGB                      13.5                04/16/2024                HCT                      42.5                04/16/2024                MCV  84.7                04/16/2024                PLT                      426 (H)             04/16/2024              Anesthesia Other Findings Last Mounjaro : 2 weeks ago   Reproductive/Obstetrics                             Anesthesia Physical Anesthesia Plan  ASA: 3  Anesthesia Plan: MAC and Spinal   Post-op Pain Management: Tylenol  PO (pre-op)*   Induction: Intravenous  PONV Risk Score and Plan: 0 and Ondansetron , Dexamethasone , Propofol infusion, TIVA, Midazolam and Treatment may vary due to age or medical condition  Airway Management Planned: Natural Airway and Simple Face Mask  Additional Equipment:   Intra-op Plan:   Post-operative Plan:   Informed Consent: I have reviewed the patients History and Physical, chart, labs and discussed the procedure including the risks, benefits and alternatives for the proposed anesthesia with the patient or authorized representative who has indicated his/her understanding and acceptance.     Dental advisory given  Plan Discussed with: CRNA and Anesthesiologist  Anesthesia Plan Comments: (I have discussed risks of neuraxial anesthesia including but not limited to infection, bleeding, nerve injury, back pain, headache, seizures, and failure of block. Patient denies bleeding disorders and is not currently anticoagulated. Labs have been reviewed. Risks and benefits  discussed. All patient's questions answered.   Discussed with patient risks of MAC including, but not limited to, minor pain or discomfort, hearing people in the room, and possible need for backup general anesthesia. Risks for general anesthesia also discussed including, but not limited to, sore throat, hoarse voice, chipped/damaged teeth, injury to vocal cords, nausea and vomiting, allergic reactions, lung infection, heart attack, stroke, and death. All questions answered. )        Anesthesia Quick Evaluation

## 2024-04-25 ENCOUNTER — Observation Stay (HOSPITAL_COMMUNITY)

## 2024-04-25 ENCOUNTER — Observation Stay (HOSPITAL_COMMUNITY)
Admission: RE | Admit: 2024-04-25 | Discharge: 2024-04-26 | Disposition: A | Discharge status: Home / Self Care | Source: Ambulatory Visit | Attending: Orthopaedic Surgery | Admitting: Orthopaedic Surgery

## 2024-04-25 ENCOUNTER — Other Ambulatory Visit: Payer: Self-pay

## 2024-04-25 ENCOUNTER — Ambulatory Visit (HOSPITAL_BASED_OUTPATIENT_CLINIC_OR_DEPARTMENT_OTHER): Admitting: Anesthesiology

## 2024-04-25 ENCOUNTER — Ambulatory Visit (HOSPITAL_COMMUNITY): Admitting: Anesthesiology

## 2024-04-25 ENCOUNTER — Ambulatory Visit (HOSPITAL_COMMUNITY)

## 2024-04-25 ENCOUNTER — Encounter (HOSPITAL_COMMUNITY): Payer: Self-pay | Admitting: Orthopaedic Surgery

## 2024-04-25 ENCOUNTER — Encounter (HOSPITAL_COMMUNITY)
Admission: RE | Disposition: A | Discharge status: Home / Self Care | Payer: Self-pay | Source: Ambulatory Visit | Attending: Orthopaedic Surgery

## 2024-04-25 DIAGNOSIS — Z79899 Other long term (current) drug therapy: Secondary | ICD-10-CM | POA: Insufficient documentation

## 2024-04-25 DIAGNOSIS — N1831 Chronic kidney disease, stage 3a: Secondary | ICD-10-CM

## 2024-04-25 DIAGNOSIS — Z96641 Presence of right artificial hip joint: Secondary | ICD-10-CM

## 2024-04-25 DIAGNOSIS — F17290 Nicotine dependence, other tobacco product, uncomplicated: Secondary | ICD-10-CM | POA: Insufficient documentation

## 2024-04-25 DIAGNOSIS — I129 Hypertensive chronic kidney disease with stage 1 through stage 4 chronic kidney disease, or unspecified chronic kidney disease: Secondary | ICD-10-CM | POA: Insufficient documentation

## 2024-04-25 DIAGNOSIS — E1122 Type 2 diabetes mellitus with diabetic chronic kidney disease: Secondary | ICD-10-CM | POA: Diagnosis not present

## 2024-04-25 DIAGNOSIS — M1611 Unilateral primary osteoarthritis, right hip: Principal | ICD-10-CM | POA: Insufficient documentation

## 2024-04-25 HISTORY — PX: TOTAL HIP ARTHROPLASTY: SHX124

## 2024-04-25 LAB — TYPE AND SCREEN
ABO/RH(D): O POS
Antibody Screen: NEGATIVE

## 2024-04-25 LAB — ABO/RH: ABO/RH(D): O POS

## 2024-04-25 SURGERY — ARTHROPLASTY, HIP, TOTAL, ANTERIOR APPROACH
Anesthesia: Monitor Anesthesia Care | Site: Hip | Laterality: Right

## 2024-04-25 MED ORDER — OXYCODONE HCL 5 MG PO TABS
10.0000 mg | ORAL_TABLET | ORAL | Status: DC | PRN
  Administered 2024-04-25 – 2024-04-26 (×3): 15 mg via ORAL
  Filled 2024-04-25 (×3): qty 3

## 2024-04-25 MED ORDER — ALUM & MAG HYDROXIDE-SIMETH 200-200-20 MG/5ML PO SUSP: 30.0000 mL | ORAL | Status: DC | PRN

## 2024-04-25 MED ORDER — FENTANYL CITRATE PF 50 MCG/ML IJ SOSY: 25.0000 ug | PREFILLED_SYRINGE | INTRAMUSCULAR | Status: DC | PRN

## 2024-04-25 MED ORDER — ATENOLOL 50 MG PO TABS
100.0000 mg | ORAL_TABLET | Freq: Every day | ORAL | Status: DC
Start: 2024-04-26 — End: 2024-04-26
  Administered 2024-04-26: 100 mg via ORAL
  Filled 2024-04-25: qty 2

## 2024-04-25 MED ORDER — ONDANSETRON HCL 4 MG/2ML IJ SOLN: 4.0000 mg | Freq: Four times a day (QID) | INTRAMUSCULAR | Status: DC | PRN

## 2024-04-25 MED ORDER — STERILE WATER FOR IRRIGATION IR SOLN
Status: DC | PRN
  Administered 2024-04-25: 1000 mL

## 2024-04-25 MED ORDER — OXYCODONE HCL 5 MG/5ML PO SOLN: 5.0000 mg | Freq: Once | ORAL | Status: AC | PRN

## 2024-04-25 MED ORDER — PROPOFOL 10 MG/ML IV BOLUS
INTRAVENOUS | Status: AC
  Filled 2024-04-25: qty 20

## 2024-04-25 MED ORDER — PHENOL 1.4 % MT LIQD: 1.0000 | OROMUCOSAL | Status: DC | PRN

## 2024-04-25 MED ORDER — CEFAZOLIN SODIUM-DEXTROSE 2-4 GM/100ML-% IV SOLN
2.0000 g | Freq: Four times a day (QID) | INTRAVENOUS | Status: AC
  Administered 2024-04-25 (×2): 2 g via INTRAVENOUS
  Filled 2024-04-25 (×2): qty 100

## 2024-04-25 MED ORDER — METOCLOPRAMIDE HCL 5 MG/ML IJ SOLN: 5.0000 mg | Freq: Three times a day (TID) | INTRAMUSCULAR | Status: DC | PRN

## 2024-04-25 MED ORDER — SODIUM CHLORIDE 0.9 % IV BOLUS
500.0000 mL | Freq: Once | INTRAVENOUS | Status: AC
  Administered 2024-04-25: 500 mL via INTRAVENOUS

## 2024-04-25 MED ORDER — OXYCODONE HCL 5 MG PO TABS
ORAL_TABLET | ORAL | Status: AC
  Filled 2024-04-25: qty 1

## 2024-04-25 MED ORDER — PANTOPRAZOLE SODIUM 40 MG PO TBEC
40.0000 mg | DELAYED_RELEASE_TABLET | Freq: Every day | ORAL | Status: DC
  Administered 2024-04-26: 40 mg via ORAL
  Filled 2024-04-25 (×2): qty 1

## 2024-04-25 MED ORDER — SPIRONOLACTONE 25 MG PO TABS
100.0000 mg | ORAL_TABLET | Freq: Every day | ORAL | Status: DC
Start: 2024-04-25 — End: 2024-04-26
  Administered 2024-04-25 – 2024-04-26 (×2): 100 mg via ORAL
  Filled 2024-04-25 (×2): qty 4

## 2024-04-25 MED ORDER — HYDROCHLOROTHIAZIDE 25 MG PO TABS
25.0000 mg | ORAL_TABLET | Freq: Every day | ORAL | Status: DC
  Administered 2024-04-25 – 2024-04-26 (×2): 25 mg via ORAL
  Filled 2024-04-25 (×2): qty 1

## 2024-04-25 MED ORDER — HYDROMORPHONE HCL 1 MG/ML IJ SOLN
0.5000 mg | INTRAMUSCULAR | Status: DC | PRN
  Administered 2024-04-25: 0.5 mg via INTRAVENOUS
  Filled 2024-04-25: qty 1

## 2024-04-25 MED ORDER — MIDAZOLAM HCL 2 MG/2ML IJ SOLN
INTRAMUSCULAR | Status: AC
  Filled 2024-04-25: qty 2

## 2024-04-25 MED ORDER — OXYCODONE HCL 5 MG PO TABS
5.0000 mg | ORAL_TABLET | ORAL | Status: DC | PRN
  Administered 2024-04-25: 10 mg via ORAL
  Administered 2024-04-26: 5 mg via ORAL
  Filled 2024-04-25 (×2): qty 2

## 2024-04-25 MED ORDER — ONDANSETRON HCL 4 MG/2ML IJ SOLN
INTRAMUSCULAR | Status: DC | PRN
  Administered 2024-04-25: 4 mg via INTRAVENOUS

## 2024-04-25 MED ORDER — SODIUM CHLORIDE 0.9 % IR SOLN
Status: DC | PRN
Start: 2024-04-25 — End: 2024-04-25
  Administered 2024-04-25: 1000 mL

## 2024-04-25 MED ORDER — 0.9 % SODIUM CHLORIDE (POUR BTL) OPTIME
TOPICAL | Status: DC | PRN
  Administered 2024-04-25: 1000 mL

## 2024-04-25 MED ORDER — ACETAMINOPHEN 325 MG PO TABS: 325.0000 mg | ORAL_TABLET | Freq: Four times a day (QID) | ORAL | Status: DC | PRN

## 2024-04-25 MED ORDER — CEFAZOLIN SODIUM-DEXTROSE 3-4 GM/150ML-% IV SOLN
3.0000 g | INTRAVENOUS | Status: AC
  Administered 2024-04-25: 3 g via INTRAVENOUS
  Filled 2024-04-25: qty 150

## 2024-04-25 MED ORDER — PROPOFOL 1000 MG/100ML IV EMUL
INTRAVENOUS | Status: AC
  Filled 2024-04-25: qty 100

## 2024-04-25 MED ORDER — DEXAMETHASONE SODIUM PHOSPHATE 10 MG/ML IJ SOLN
INTRAMUSCULAR | Status: AC
  Filled 2024-04-25: qty 1

## 2024-04-25 MED ORDER — METHOCARBAMOL 500 MG PO TABS: 500.0000 mg | ORAL_TABLET | Freq: Four times a day (QID) | ORAL | Status: DC | PRN

## 2024-04-25 MED ORDER — CHLORHEXIDINE GLUCONATE 0.12 % MT SOLN
15.0000 mL | Freq: Once | OROMUCOSAL | Status: AC
  Administered 2024-04-25: 15 mL via OROMUCOSAL

## 2024-04-25 MED ORDER — SODIUM CHLORIDE 0.9 % IV SOLN: INTRAVENOUS | Status: DC

## 2024-04-25 MED ORDER — DOCUSATE SODIUM 100 MG PO CAPS
100.0000 mg | ORAL_CAPSULE | Freq: Two times a day (BID) | ORAL | Status: DC
  Administered 2024-04-25 – 2024-04-26 (×3): 100 mg via ORAL
  Filled 2024-04-25 (×3): qty 1

## 2024-04-25 MED ORDER — ONDANSETRON HCL 4 MG/2ML IJ SOLN
INTRAMUSCULAR | Status: AC
  Filled 2024-04-25: qty 2

## 2024-04-25 MED ORDER — HYDROMORPHONE HCL 1 MG/ML IJ SOLN
0.5000 mg | INTRAMUSCULAR | Status: DC | PRN
  Administered 2024-04-25: 1 mg via INTRAVENOUS
  Filled 2024-04-25: qty 1

## 2024-04-25 MED ORDER — OXYCODONE HCL 5 MG PO TABS
5.0000 mg | ORAL_TABLET | Freq: Once | ORAL | Status: AC | PRN
  Administered 2024-04-25: 5 mg via ORAL

## 2024-04-25 MED ORDER — METHOCARBAMOL 1000 MG/10ML IJ SOLN: 500.0000 mg | Freq: Four times a day (QID) | INTRAMUSCULAR | Status: DC | PRN

## 2024-04-25 MED ORDER — ASPIRIN 81 MG PO CHEW
81.0000 mg | CHEWABLE_TABLET | Freq: Two times a day (BID) | ORAL | Status: DC
  Administered 2024-04-25 – 2024-04-26 (×2): 81 mg via ORAL
  Filled 2024-04-25 (×2): qty 1

## 2024-04-25 MED ORDER — CEFAZOLIN SODIUM 1 G IJ SOLR
INTRAMUSCULAR | Status: AC
  Filled 2024-04-25: qty 10

## 2024-04-25 MED ORDER — DEXAMETHASONE SODIUM PHOSPHATE 10 MG/ML IJ SOLN
INTRAMUSCULAR | Status: DC | PRN
  Administered 2024-04-25: 8 mg via INTRAVENOUS

## 2024-04-25 MED ORDER — PROPOFOL 500 MG/50ML IV EMUL
INTRAVENOUS | Status: DC | PRN
  Administered 2024-04-25: 100 ug/kg/min via INTRAVENOUS

## 2024-04-25 MED ORDER — ALLOPURINOL 100 MG PO TABS
200.0000 mg | ORAL_TABLET | Freq: Every day | ORAL | Status: DC
  Filled 2024-04-25: qty 2

## 2024-04-25 MED ORDER — METOCLOPRAMIDE HCL 5 MG PO TABS: 5.0000 mg | ORAL_TABLET | Freq: Three times a day (TID) | ORAL | Status: DC | PRN

## 2024-04-25 MED ORDER — ONDANSETRON HCL 4 MG PO TABS: 4.0000 mg | ORAL_TABLET | Freq: Four times a day (QID) | ORAL | Status: DC | PRN

## 2024-04-25 MED ORDER — LISINOPRIL 20 MG PO TABS
20.0000 mg | ORAL_TABLET | Freq: Every day | ORAL | Status: DC
  Administered 2024-04-25: 20 mg via ORAL
  Filled 2024-04-25: qty 1

## 2024-04-25 MED ORDER — AMISULPRIDE (ANTIEMETIC) 5 MG/2ML IV SOLN: 10.0000 mg | Freq: Once | INTRAVENOUS | Status: DC | PRN

## 2024-04-25 MED ORDER — LACTATED RINGERS IV SOLN: INTRAVENOUS | Status: DC

## 2024-04-25 MED ORDER — ORAL CARE MOUTH RINSE: 15.0000 mL | Freq: Once | OROMUCOSAL | Status: AC

## 2024-04-25 MED ORDER — KETOROLAC TROMETHAMINE 15 MG/ML IJ SOLN
7.5000 mg | Freq: Four times a day (QID) | INTRAMUSCULAR | Status: DC
  Administered 2024-04-25: 7.5 mg via INTRAVENOUS
  Filled 2024-04-25: qty 1

## 2024-04-25 MED ORDER — TRANEXAMIC ACID-NACL 1000-0.7 MG/100ML-% IV SOLN
1000.0000 mg | INTRAVENOUS | Status: AC
  Administered 2024-04-25: 1000 mg via INTRAVENOUS
  Filled 2024-04-25: qty 100

## 2024-04-25 MED ORDER — GABAPENTIN 100 MG PO CAPS
100.0000 mg | ORAL_CAPSULE | Freq: Three times a day (TID) | ORAL | Status: DC
  Administered 2024-04-25 – 2024-04-26 (×3): 100 mg via ORAL
  Filled 2024-04-25 (×3): qty 1

## 2024-04-25 MED ORDER — POVIDONE-IODINE 10 % EX SWAB: 2.0000 | Freq: Once | CUTANEOUS | Status: DC

## 2024-04-25 MED ORDER — MIDAZOLAM HCL 2 MG/2ML IJ SOLN
INTRAMUSCULAR | Status: DC | PRN
  Administered 2024-04-25: 2 mg via INTRAVENOUS

## 2024-04-25 MED ORDER — ACETAMINOPHEN 500 MG PO TABS
1000.0000 mg | ORAL_TABLET | Freq: Once | ORAL | Status: AC
  Administered 2024-04-25: 1000 mg via ORAL
  Filled 2024-04-25: qty 2

## 2024-04-25 MED ORDER — DIPHENHYDRAMINE HCL 12.5 MG/5ML PO ELIX
12.5000 mg | ORAL_SOLUTION | ORAL | Status: DC | PRN
  Administered 2024-04-26: 25 mg via ORAL
  Filled 2024-04-25: qty 10

## 2024-04-25 MED ORDER — MENTHOL 3 MG MT LOZG: 1.0000 | LOZENGE | OROMUCOSAL | Status: DC | PRN

## 2024-04-25 MED ORDER — ATORVASTATIN CALCIUM 40 MG PO TABS
40.0000 mg | ORAL_TABLET | Freq: Every day | ORAL | Status: DC
  Administered 2024-04-25 – 2024-04-26 (×2): 40 mg via ORAL
  Filled 2024-04-25 (×2): qty 1

## 2024-04-25 SURGICAL SUPPLY — 36 items
BAG COUNTER SPONGE SURGICOUNT (BAG) ×1 IMPLANT
BAG ZIPLOCK 12X15 (MISCELLANEOUS) IMPLANT
BENZOIN TINCTURE PRP APPL 2/3 (GAUZE/BANDAGES/DRESSINGS) IMPLANT
BLADE SAW SGTL 18X1.27X75 (BLADE) ×1 IMPLANT
COVER PERINEAL POST (MISCELLANEOUS) ×1 IMPLANT
COVER SURGICAL LIGHT HANDLE (MISCELLANEOUS) ×1 IMPLANT
CUP ACETAB W/GRIPTION 54 (Plate) IMPLANT
DRAPE FOOT SWITCH (DRAPES) ×1 IMPLANT
DRAPE STERI IOBAN 125X83 (DRAPES) ×1 IMPLANT
DRAPE U-SHAPE 47X51 STRL (DRAPES) ×2 IMPLANT
DRSG AQUACEL AG ADV 3.5X10 (GAUZE/BANDAGES/DRESSINGS) ×1 IMPLANT
DURAPREP 26ML APPLICATOR (WOUND CARE) ×1 IMPLANT
ELECT PENCIL ROCKER SW 15FT (MISCELLANEOUS) ×1 IMPLANT
ELECT REM PT RETURN 15FT ADLT (MISCELLANEOUS) ×1 IMPLANT
GAUZE XEROFORM 1X8 LF (GAUZE/BANDAGES/DRESSINGS) IMPLANT
GLOVE BIO SURGEON STRL SZ7.5 (GLOVE) ×1 IMPLANT
GLOVE BIOGEL PI IND STRL 8 (GLOVE) ×2 IMPLANT
GLOVE ECLIPSE 8.0 STRL XLNG CF (GLOVE) ×1 IMPLANT
GOWN STRL REUS W/ TWL XL LVL3 (GOWN DISPOSABLE) ×2 IMPLANT
HEAD CERAMIC 36 PLUS5 (Hips) IMPLANT
HOLDER FOLEY CATH W/STRAP (MISCELLANEOUS) ×1 IMPLANT
KIT TURNOVER KIT A (KITS) IMPLANT
LINER NEUTRAL 54X36MM PLUS 4 (Hips) IMPLANT
PACK ANTERIOR HIP CUSTOM (KITS) ×1 IMPLANT
SET HNDPC FAN SPRY TIP SCT (DISPOSABLE) ×1 IMPLANT
STAPLER SKIN PROX 35W (STAPLE) IMPLANT
STEM FEMORAL SZ5 HIGH ACTIS (Stem) IMPLANT
STRIP CLOSURE SKIN 1/2X4 (GAUZE/BANDAGES/DRESSINGS) IMPLANT
SUT ETHIBOND NAB CT1 #1 30IN (SUTURE) ×1 IMPLANT
SUT ETHILON 2 0 PS N (SUTURE) IMPLANT
SUT MNCRL AB 4-0 PS2 18 (SUTURE) IMPLANT
SUT VIC AB 0 CT1 36 (SUTURE) ×1 IMPLANT
SUT VIC AB 1 CT1 36 (SUTURE) ×1 IMPLANT
SUT VIC AB 2-0 CT1 TAPERPNT 27 (SUTURE) ×2 IMPLANT
TRAY FOLEY MTR SLVR 16FR STAT (SET/KITS/TRAYS/PACK) IMPLANT
YANKAUER SUCT BULB TIP NO VENT (SUCTIONS) ×1 IMPLANT

## 2024-04-25 NOTE — Evaluation (Signed)
 Physical Therapy Evaluation Patient Details Name: UNNAMED ZEIEN MRN: [id number] DOB: Apr 21, 1976 Today's Date: 04/25/2024  History of Present Illness  Pt s/p R THR adn with hx of DM, obesity, CKD, and bilat rotator cuff tears  Clinical Impression  Pt s/p R THR and presents with decreased R LE strength/ROM, post op pain and post op orthostatic hypotension limiting functional mobility.  Pt motivated and should progress to dc home with family assist.        If plan is discharge home, recommend the following: A little help with walking and/or transfers;A little help with bathing/dressing/bathroom;Assistance with cooking/housework;Help with stairs or ramp for entrance;Assist for transportation   Can travel by private vehicle        Equipment Recommendations Rolling walker (2 wheels)  Recommendations for Other Services       Functional Status Assessment Patient has had a recent decline in their functional status and demonstrates the ability to make significant improvements in function in a reasonable and predictable amount of time.     Precautions / Restrictions Precautions Precautions: Fall Precaution/Restrictions Comments: Pt orthostatic on eval Restrictions Weight Bearing Restrictions Per Provider Order: No Other Position/Activity Restrictions: WBAT      Mobility  Bed Mobility Overal bed mobility: Needs Assistance Bed Mobility: Supine to Sit, Sit to Supine     Supine to sit: Contact guard Sit to supine: Contact guard assist   General bed mobility comments: for safety only    Transfers Overall transfer level: Needs assistance Equipment used: Rolling walker (2 wheels) Transfers: Sit to/from Stand Sit to Stand: Contact guard assist           General transfer comment: cues for LE management and use of UEs to self assist    Ambulation/Gait               General Gait Details: Stood only - orthostatic with standing  Stairs            Wheelchair  Mobility     Tilt Bed    Modified Rankin (Stroke Patients Only)       Balance Overall balance assessment: Mild deficits observed, not formally tested                                           Pertinent Vitals/Pain Pain Assessment Pain Assessment: 0-10 Pain Score: 10-Worst pain ever Pain Location: R hip Pain Descriptors / Indicators: Aching, Grimacing, Sore Pain Intervention(s): Limited activity within patient's tolerance, Monitored during session, Premedicated before session, Patient requesting pain meds-RN notified, RN gave pain meds during session, Ice applied    Home Living Family/patient expects to be discharged to:: Private residence Living Arrangements: Spouse/significant other;Children Available Help at Discharge: Family;Available 24 hours/day Type of Home: House Home Access: Level entry     Alternate Level Stairs-Number of Steps: flight Home Layout: Two level Home Equipment: None      Prior Function Prior Level of Function : Independent/Modified Independent                     Extremity/Trunk Assessment   Upper Extremity Assessment Upper Extremity Assessment: Overall WFL for tasks assessed    Lower Extremity Assessment Lower Extremity Assessment: RLE deficits/detail RLE: Unable to fully assess due to pain    Cervical / Trunk Assessment Cervical / Trunk Assessment: Normal  Communication   Communication Communication: No apparent difficulties  Cognition Arousal: Alert Behavior During Therapy: WFL for tasks assessed/performed   PT - Cognitive impairments: No apparent impairments                         Following commands: Intact       Cueing Cueing Techniques: Verbal cues     General Comments      Exercises Total Joint Exercises Ankle Circles/Pumps: AROM, Both, 15 reps, Supine   Assessment/Plan    PT Assessment    PT Problem List         PT Treatment Interventions      PT Goals (Current  goals can be found in the Care Plan section)  Acute Rehab PT Goals Patient Stated Goal: Regain IND, decreased pain PT Goal Formulation: With patient Time For Goal Achievement: 05/02/24 Potential to Achieve Goals: Good    Frequency       Co-evaluation               AM-PAC PT "6 Clicks" Mobility  Outcome Measure Help needed turning from your back to your side while in a flat bed without using bedrails?: A Little Help needed moving from lying on your back to sitting on the side of a flat bed without using bedrails?: A Little Help needed moving to and from a bed to a chair (including a wheelchair)?: A Little Help needed standing up from a chair using your arms (e.g., wheelchair or bedside chair)?: A Little Help needed to walk in hospital room?: Total Help needed climbing 3-5 steps with a railing? : Total 6 Click Score: 14    End of Session Equipment Utilized During Treatment: Gait belt Activity Tolerance: Patient limited by pain;Other (comment) (orthostatic) Patient left: in bed;with call bell/phone within reach;with bed alarm set;with family/visitor present Nurse Communication: Mobility status (orthostatic)      Time: 6962-9528 PT Time Calculation (min) (ACUTE ONLY): 20 min   Charges:   PT Evaluation $PT Eval Low Complexity: 1 Low   PT General Charges $$ ACUTE PT VISIT: 1 Visit         Thedora Finlay PT Acute Rehabilitation Services Pager [phone] Office [phone]   Uri Turnbough 04/25/2024, 4:09 PM

## 2024-04-25 NOTE — Transfer of Care (Signed)
 Immediate Anesthesia Transfer of Care Note  Patient: Robert Cruz  Procedure(s) Performed: ARTHROPLASTY, HIP, TOTAL, ANTERIOR APPROACH (Right: Hip)  Patient Location: PACU  Anesthesia Type:Spinal  Level of Consciousness: drowsy  Airway & Oxygen Therapy: Patient Spontanous Breathing and Patient connected to face mask oxygen  Post-op Assessment: Report given to RN and Post -op Vital signs reviewed and stable  Post vital signs: Reviewed and stable  Last Vitals:  Vitals Value Taken Time  BP 103/65   Temp    Pulse 74 04/25/24 0905  Resp 12 04/25/24 0905  SpO2 97 % 04/25/24 0905  Vitals shown include unfiled device data.  Last Pain:  Vitals:   04/25/24 0611  TempSrc: Oral  PainSc:          Complications: No notable events documented.

## 2024-04-25 NOTE — Op Note (Signed)
 Operative Note  Date of operation: 04/25/2024 Preoperative diagnosis: Right hip primary osteoarthritis Postoperative diagnosis: Same  Procedure: Right direct anterior total hip arthroplasty  Implants: Implant Name Type Inv. Item Serial No. Manufacturer Lot No. LRB No. Used Action  CUP ACETAB W/GRIPTION 54 - [id number] Plate CUP ACETAB W/GRIPTION 54  [employer] [id number] Right 1 Implanted  LINER NEUTRAL 54X36MM PLUS 4 - [id number] Hips LINER NEUTRAL 54X36MM PLUS 4  DEPUY ORTH[employer] [id number] Right 1 Implanted  HEAD CERAMIC 36 PLUS5 - [id number] Hips HEAD CERAMIC 36 PLUS5  DEPU[employer]TH[employer] [id number] Right 1 Implanted  STEM FEMORAL SZ5 HIGH ACTIS - [id number] Stem STEM FEMORAL SZ5 HIGH ACTIS  [employer] [id number] Right 1 Implanted   Surgeon: Jeanella Milan. Lucienne Ryder, MD Assistant: Malena Scull, PA-C  Anesthesia: Spinal EBL: 400 cc Antibiotics: IV Ancef Complications: None  Indications: The patient is an active 48 year old gentleman who unfortunately has severe debilitating arthritis involving his right hip.  He used to be significantly morbidly obese and has been on a weight loss journey.  His BMI was in the mid to high 40s and is down to 36.  He continues to have debilitating hip pain in spite of his weight loss and his x-rays do show significant arthritis of the right hip.  He has tried and failed conservative treatment for over a year and at this point wishes to proceed with a total hip arthroplasty.  We had a long and thorough discussion about the risks of acute blood loss anemia, nerve vessel injury, fracture, infection, DVT, dislocation, implant failure, leg length differences and wound healing issues.  He understands that our goals are hopefully decreased pain, improved mobility and improved quality of life.  Procedure description: After informed consent was obtained and the appropriate right hip was marked, the patient was brought to the operating room and set up on the  stretcher where spinal anesthesia was obtained.  He was then laid in supine position on stretcher and a Foley catheter was placed.  We assessed his leg lengths and the slightly shorter on the right side.  Traction boots were placed on both his feet and next he was placed supine on the Hana fracture table with a perineal post and placed in both legs in inline skeletal traction devices but no traction applied.  His right operative hip and pelvis were assessed radiographically.  The right hip was prepped and draped with DuraPrep and sterile drapes.  A timeout was called and he was identified as the correct patient the correct right hip.  An incision was then made just inferior and posterior to the ASIS and carried slightly obliquely down the leg.  Dissection was carried down to the tensor fascia lata muscle and the tensor fascia was divided longitudinally to proceed with a direct anterior process of the hip.  Circumflex vessels were identified and cauterized.  The hip capsule was identified and opened up in L-type format.  Cobra retractors were placed around the medial and lateral femoral neck and a femoral neck cut was made with an oscillating saw just proximal to the lesser trochanter and this cut was completed with an osteotome.  A corkscrew guide was placed in the femoral head and the femoral head was removed its entirety and there was a wide area devoid of cartilage.  A bent Hohmann was then placed over the medial acetabular rim and remnants of the acetabular labrum and other debris removed.  Reaming was initiated from a size 43 reamer and stepwise increments going up  to a size 53 reamer with all reamers placed under direct visualization and the last reamer placed under direct fluoroscopy in order to obtain the depth and reaming, the inclination and the anteversion.  The real DePuy sector GRIPTION acetabular opponent size 54 was then placed without difficulty followed by a 36+4 thin liner based on the patient's  offset.  Attention was then turned to the femur.  With the right leg externally rotated to 120 degrees, extended and adducted, a Mueller retractor was placed medially and Hohmann retractor behind the greater trochanter.  The lateral joint capsule was released and a box cutting osteotome was used in the femoral canal.  Broaching was then initiated using the Actis broaching system from a size 0 going up to a size 5.  With a size 5 in place we trialed a standard offset femoral neck and a 36+1.5 trial head ball.  The right leg was brought over and up and with traction and internal rotation reduced in the pelvis.  Based on radiographic and clinical assessment we felt like we needed just a little more leg length.  We dislocated the hip and with the trial components.  We then placed the real Actis femoral component with high offset size 5 and the real 36+5 ceramic head ball.  Again this was reduced in the pelvis and we are pleased with range of motion and stability as well as offset and leg length.  The soft tissue was then irrigated with normal saline solution.  Remnants of the joint capsule were closed with interrupted #1 Ethibond suture followed by #1 Vicryl to close the tensor fascia.  0 Vicryl was used to close the deep tissue and 2-0 Vicryl was used to close subcutaneous tissue.  The skin was closed with staples.  An Aquacel dressing was applied.  The patient was taken off the Hana table and taken recovery room.  Malena Scull, PA-C did assist during the entire case and beginning to end and his assistance was crucial and medically necessary for soft tissue management and retraction, helping guide implant placement and a layered closure of the wound.

## 2024-04-25 NOTE — Interval H&P Note (Signed)
 History and Physical Interval Note: The patient understands that he is here today for a right total hip replacement to treat his significant right hip pain and arthritis.  There has been no interval change in his medical status.  The risks and benefits of surgery have been discussed in detail and informed consent has been obtained.  The right operative hip has been marked.  04/25/2024 7:08 AM  Robert Cruz  has presented today for surgery, with the diagnosis of Right hip osteoarthritis.  The various methods of treatment have been discussed with the patient and family. After consideration of risks, benefits and other options for treatment, the patient has consented to  Procedure(s): ARTHROPLASTY, HIP, TOTAL, ANTERIOR APPROACH (Right) as a surgical intervention.  The patient's history has been reviewed, patient examined, no change in status, stable for surgery.  I have reviewed the patient's chart and labs.  Questions were answered to the patient's satisfaction.     Arnie Lao

## 2024-04-25 NOTE — Discharge Instructions (Signed)
 Per [facility] clinic policy, our goal is ensure optimal postoperative pain control with a multimodal pain management strategy. For all [facility] patients, our goal is to wean post-operative narcotic medications by 6 weeks post-operatively. If this is not possible due to utilization of pain medication prior to surgery, your [facility] doctor will support your acute post-operative pain control for the first 6 weeks postoperatively, with a plan to transition you back to your primary pain team following that. Robert Cruz will work to ensure a Therapist, occupational.  INSTRUCTIONS AFTER JOINT REPLACEMENT   Remove items at home which could result in a fall. This includes throw rugs or furniture in walking pathways ICE to the affected joint every three hours while awake for 30 minutes at a time, for at least the first 3-5 days, and then as needed for pain and swelling.  Continue to use ice for pain and swelling. You may notice swelling that will progress down to the foot and ankle.  This is normal after surgery.  Elevate your leg when you are not up walking on it.   Continue to use the breathing machine you got in the hospital (incentive spirometer) which will help keep your temperature down.  It is common for your temperature to cycle up and down following surgery, especially at night when you are not up moving around and exerting yourself.  The breathing machine keeps your lungs expanded and your temperature down.   DIET:  As you were doing prior to hospitalization, we recommend a well-balanced diet.  DRESSING / WOUND CARE / SHOWERING  Keep the surgical dressing until follow up.  The dressing is water proof, so you can shower without any extra covering.  IF THE DRESSING FALLS OFF or the wound gets wet inside, change the dressing with sterile gauze.  Please use good hand washing techniques before changing the dressing.  Do not use any lotions or creams on the incision until instructed by your surgeon.     ACTIVITY  Increase activity slowly as tolerated, but follow the weight bearing instructions below.   No driving for 6 weeks or until further direction given by your physician.  You cannot drive while taking narcotics.  No lifting or carrying greater than 10 lbs. until further directed by your surgeon. Avoid periods of inactivity such as sitting longer than an hour when not asleep. This helps prevent blood clots.  You may return to work once you are authorized by your doctor.     WEIGHT BEARING   Weight bearing as tolerated with assist device (walker, cane, etc) as directed, use it as long as suggested by your surgeon or therapist, typically at least 4-6 weeks.   EXERCISES  Results after joint replacement surgery are often greatly improved when you follow the exercise, range of motion and muscle strengthening exercises prescribed by your doctor. Safety measures are also important to protect the joint from further injury. Any time any of these exercises cause you to have increased pain or swelling, decrease what you are doing until you are comfortable again and then slowly increase them. If you have problems or questions, call your caregiver or physical therapist for advice.   Rehabilitation is important following a joint replacement. After just a few days of immobilization, the muscles of the leg can become weakened and shrink (atrophy).  These exercises are designed to build up the tone and strength of the thigh and leg muscles and to improve motion. Often times heat used for twenty to thirty minutes before  working out will loosen up your tissues and help with improving the range of motion but do not use heat for the first two weeks following surgery (sometimes heat can increase post-operative swelling).   These exercises can be done on a training (exercise) mat, on the floor, on a table or on a bed. Use whatever works the best and is most comfortable for you.    Use music or television  while you are exercising so that the exercises are a pleasant break in your day. This will make your life better with the exercises acting as a break in your routine that you can look forward to.   Perform all exercises about fifteen times, three times per day or as directed.  You should exercise both the operative leg and the other leg as well.  Exercises include:   Quad Sets - Tighten up the muscle on the front of the thigh (Quad) and hold for 5-10 seconds.   Straight Leg Raises - With your knee straight (if you were given a brace, keep it on), lift the leg to 60 degrees, hold for 3 seconds, and slowly lower the leg.  Perform this exercise against resistance later as your leg gets stronger.  Leg Slides: Lying on your back, slowly slide your foot toward your buttocks, bending your knee up off the floor (only go as far as is comfortable). Then slowly slide your foot back down until your leg is flat on the floor again.  Angel Wings: Lying on your back spread your legs to the side as far apart as you can without causing discomfort.  Hamstring Strength:  Lying on your back, push your heel against the floor with your leg straight by tightening up the muscles of your buttocks.  Repeat, but this time bend your knee to a comfortable angle, and push your heel against the floor.  You may put a pillow under the heel to make it more comfortable if necessary.   A rehabilitation program following joint replacement surgery can speed recovery and prevent re-injury in the future due to weakened muscles. Contact your doctor or a physical therapist for more information on knee rehabilitation.    CONSTIPATION  Constipation is defined medically as fewer than three stools per week and severe constipation as less than one stool per week.  Even if you have a regular bowel pattern at home, your normal regimen is likely to be disrupted due to multiple reasons following surgery.  Combination of anesthesia, postoperative  narcotics, change in appetite and fluid intake all can affect your bowels.   YOU MUST use at least one of the following options; they are listed in order of increasing strength to get the job done.  They are all available over the counter, and you may need to use some, POSSIBLY even all of these options:    Drink plenty of fluids (prune juice may be helpful) and high fiber foods Colace 100 mg by mouth twice a day  Senokot for constipation as directed and as needed Dulcolax (bisacodyl), take with full glass of water  Miralax (polyethylene glycol) once or twice a day as needed.  If you have tried all these things and are unable to have a bowel movement in the first 3-4 days after surgery call either your surgeon or your primary doctor.    If you experience loose stools or diarrhea, hold the medications until you stool forms back up.  If your symptoms do not get better within 1 week  or if they get worse, check with your doctor.  If you experience "the worst abdominal pain ever" or develop nausea or vomiting, please contact the office immediately for further recommendations for treatment.   ITCHING:  If you experience itching with your medications, try taking only a single pain pill, or even half a pain pill at a time.  You can also use Benadryl over the counter for itching or also to help with sleep.   TED HOSE STOCKINGS:  Use stockings on both legs until for at least 2 weeks or as directed by physician office. They may be removed at night for sleeping.  MEDICATIONS:  See your medication summary on the "After Visit Summary" that nursing will review with you.  You may have some home medications which will be placed on hold until you complete the course of blood thinner medication.  It is important for you to complete the blood thinner medication as prescribed.  PRECAUTIONS:  If you experience chest pain or shortness of breath - call 911 immediately for transfer to the hospital emergency department.    If you develop a fever greater that 101 F, purulent drainage from wound, increased redness or drainage from wound, foul odor from the wound/dressing, or calf pain - CONTACT YOUR SURGEON.                                                   FOLLOW-UP APPOINTMENTS:  If you do not already have a post-op appointment, please call the office for an appointment to be seen by your surgeon.  Guidelines for how soon to be seen are listed in your "After Visit Summary", but are typically between 1-4 weeks after surgery.  OTHER INSTRUCTIONS:   Knee Replacement:  Do not place pillow under knee, focus on keeping the knee straight while resting. CPM instructions: 0-90 degrees, 2 hours in the morning, 2 hours in the afternoon, and 2 hours in the evening. Place foam block, curve side up under heel at all times except when in CPM or when walking.  DO NOT modify, tear, cut, or change the foam block in any way.  POST-OPERATIVE OPIOID TAPER INSTRUCTIONS: It is important to wean off of your opioid medication as soon as possible. If you do not need pain medication after your surgery it is ok to stop day one. Opioids include: Codeine, Hydrocodone(Norco, Vicodin), Oxycodone(Percocet, oxycontin) and hydromorphone amongst others.  Long term and even short term use of opiods can cause: Increased pain response Dependence Constipation Depression Respiratory depression And more.  Withdrawal symptoms can include Flu like symptoms Nausea, vomiting And more Techniques to manage these symptoms Hydrate well Eat regular healthy meals Stay active Use relaxation techniques(deep breathing, meditating, yoga) Do Not substitute Alcohol to help with tapering If you have been on opioids for less than two weeks and do not have pain than it is ok to stop all together.  Plan to wean off of opioids This plan should start within one week post op of your joint replacement. Maintain the same interval or time between taking each dose  and first decrease the dose.  Cut the total daily intake of opioids by one tablet each day Next start to increase the time between doses. The last dose that should be eliminated is the evening dose.   MAKE SURE YOU:  Understand these instructions.  Get help right away if you are not doing well or get worse.    Thank you for letting us be a part of your medical care team.  It is a privilege we respect greatly.  We hope these instructions will help you stay on track for a fast and full recovery!      Dental Antibiotics:  In most cases prophylactic antibiotics for Dental procdeures after total joint surgery are not necessary.  Exceptions are as follows:  1. History of prior total joint infection  2. Severely immunocompromised (Organ Transplant, cancer chemotherapy, Rheumatoid biologic meds such as Humera)  3. Poorly controlled diabetes (A1C &gt; 8.0, blood glucose over 200)  If you have one of these conditions, contact your surgeon for an antibiotic prescription, prior to your dental procedure.

## 2024-04-25 NOTE — Anesthesia Procedure Notes (Signed)
 Procedure Name: MAC Date/Time: 04/25/2024 7:24 AM  Performed by: Ulanda Gambles, CRNAPre-anesthesia Checklist: Patient identified, Emergency Drugs available, Suction available and Patient being monitored Oxygen Delivery Method: Simple face mask

## 2024-04-25 NOTE — Anesthesia Postprocedure Evaluation (Signed)
 Anesthesia Post Note  Patient: Robert Cruz  Procedure(s) Performed: ARTHROPLASTY, HIP, TOTAL, ANTERIOR APPROACH (Right: Hip)     Patient location during evaluation: PACU Anesthesia Type: MAC and Spinal Level of consciousness: awake Pain management: pain level controlled Vital Signs Assessment: post-procedure vital signs reviewed and stable Respiratory status: spontaneous breathing, respiratory function stable and nonlabored ventilation Cardiovascular status: blood pressure returned to baseline and stable Postop Assessment: no headache, no backache and no apparent nausea or vomiting Anesthetic complications: no   No notable events documented.  Last Vitals:  Vitals:   04/25/24 1015 04/25/24 1037  BP: 121/84 118/83  Pulse: 79 75  Resp: 19 20  Temp:  36.7 C  SpO2: 99% 99%    Last Pain:  Vitals:   04/25/24 1037  TempSrc: Oral  PainSc: 6                  Conard Decent

## 2024-04-25 NOTE — Anesthesia Procedure Notes (Addendum)
 Spinal  Patient location during procedure: OR Start time: 04/25/2024 7:25 AM End time: 04/25/2024 7:28 AM Reason for block: surgical anesthesia Staffing Performed: resident/CRNA  Resident/CRNA: Ulanda Gambles, CRNA Performed by: Ulanda Gambles, CRNA Authorized by: Conard Decent, MD   Preanesthetic Checklist Completed: patient identified, IV checked, risks and benefits discussed, surgical consent, monitors and equipment checked, pre-op evaluation and timeout performed Spinal Block Patient position: sitting Prep: DuraPrep Patient monitoring: heart rate, continuous pulse ox and blood pressure Approach: midline Location: L3-4 Injection technique: single-shot Needle Needle type: Pencan  Needle gauge: 24 G Needle length: 10 cm Additional Notes Expiration date of kit checked. Clear CSF flow prior to injection. Dr. Leilani Punter present

## 2024-04-25 NOTE — Progress Notes (Signed)
   04/25/24 2025  BiPAP/CPAP/SIPAP  $ Non-Invasive Home Ventilator  Initial  BiPAP/CPAP/SIPAP Pt Type Adult  BiPAP/CPAP/SIPAP DREAMSTATIOND  Mask Type Full face mask  Dentures removed? Not applicable  FiO2 (%) 21 %  Patient Home Machine No  Patient Home Mask Yes  Patient Home Tubing Yes  Auto Titrate Yes  Minimum cmH2O 10 cmH2O  Maximum cmH2O 15 cmH2O  Device Plugged into RED Power Outlet Yes  BiPAP/CPAP /SiPAP Vitals  Pulse Rate 77  Resp 18  SpO2 100 %  Bilateral Breath Sounds Clear  MEWS Score/Color  MEWS Score 0  MEWS Score Color Robert Cruz

## 2024-04-26 ENCOUNTER — Encounter (HOSPITAL_COMMUNITY): Payer: Self-pay | Admitting: Orthopaedic Surgery

## 2024-04-26 DIAGNOSIS — M1611 Unilateral primary osteoarthritis, right hip: Secondary | ICD-10-CM | POA: Diagnosis not present

## 2024-04-26 LAB — CBC
HCT: 33.2 % — ABNORMAL LOW (ref 39.0–52.0)
Hemoglobin: 11.1 g/dL — ABNORMAL LOW (ref 13.0–17.0)
MCH: 28.1 pg (ref 26.0–34.0)
MCHC: 33.4 g/dL (ref 30.0–36.0)
MCV: 84.1 fL (ref 80.0–100.0)
Platelets: 352 10*3/uL (ref 150–400)
RBC: 3.95 MIL/uL — ABNORMAL LOW (ref 4.22–5.81)
RDW: 14.6 % (ref 11.5–15.5)
WBC: 16.5 10*3/uL — ABNORMAL HIGH (ref 4.0–10.5)
nRBC: 0 % (ref 0.0–0.2)

## 2024-04-26 LAB — BASIC METABOLIC PANEL WITH GFR
Anion gap: 10 (ref 5–15)
BUN: 21 mg/dL — ABNORMAL HIGH (ref 6–20)
CO2: 22 mmol/L (ref 22–32)
Calcium: 8.7 mg/dL — ABNORMAL LOW (ref 8.9–10.3)
Chloride: 102 mmol/L (ref 98–111)
Creatinine, Ser: 1.22 mg/dL (ref 0.61–1.24)
GFR, Estimated: >60 mL/min
Glucose, Bld: 121 mg/dL — ABNORMAL HIGH (ref 70–99)
Potassium: 3.9 mmol/L (ref 3.5–5.1)
Sodium: 134 mmol/L — ABNORMAL LOW (ref 135–145)

## 2024-04-26 MED ORDER — ASPIRIN 81 MG PO CHEW: 81.0000 mg | CHEWABLE_TABLET | Freq: Two times a day (BID) | ORAL | 0 refills | Status: DC

## 2024-04-26 MED ORDER — OXYCODONE HCL 5 MG PO TABS
5.0000 mg | ORAL_TABLET | Freq: Four times a day (QID) | ORAL | 0 refills | Status: DC | PRN
Start: 2024-04-26 — End: 2024-05-01

## 2024-04-26 MED ORDER — TIZANIDINE HCL 4 MG PO TABS: 4.0000 mg | ORAL_TABLET | Freq: Four times a day (QID) | ORAL | 1 refills | Status: DC | PRN

## 2024-04-26 NOTE — Progress Notes (Signed)
 Physical Therapy Treatment Patient Details Name: Robert Cruz MRN: [id number] DOB: 1976-11-01 Today's Date: 04/26/2024   History of Present Illness Pt s/p R THR adn with hx of DM, obesity, CKD, and bilat rotator cuff tears    PT Comments  Pt motivated and progressing well with mobility. Robert Cruz  HEP initiated and pt up to ambulate in hall - no c/o dizziness and pain much better controlled.  Will follow up with stair training prior to dc.   If plan is discharge home, recommend the following: A little help with walking and/or transfers;A little help with bathing/dressing/bathroom;Assistance with cooking/housework;Help with stairs or ramp for entrance;Assist for transportation   Can travel by private vehicle        Equipment Recommendations  Rolling walker (2 wheels)    Recommendations for Other Services       Precautions / Restrictions Precautions Precautions: Fall Precaution/Restrictions Comments: Pt orthostatic on eval Restrictions Weight Bearing Restrictions Per Provider Order: No Other Position/Activity Restrictions: WBAT     Mobility  Bed Mobility Overal bed mobility: Needs Assistance Bed Mobility: Supine to Sit     Supine to sit: Supervision     General bed mobility comments: Increaed time, pt self assisting R LE with gait belt    Transfers Overall transfer level: Needs assistance Equipment used: Rolling walker (2 wheels) Transfers: Sit to/from Stand Sit to Stand: Contact guard assist           General transfer comment: cues for LE management and use of UEs to self assist    Ambulation/Gait Ambulation/Gait assistance: Contact guard assist, Supervision Gait Distance (Feet): 140 Feet Assistive device: Rolling walker (2 wheels) Gait Pattern/deviations: Step-to pattern, Step-through pattern, Decreased step length - right, Decreased step length - left, Shuffle, Trunk flexed Gait velocity: decr     General Gait Details: cues for posture and position from  [employer]             Wheelchair Mobility     Tilt Bed    Modified Rankin (Stroke Patients Only)       Balance Overall balance assessment: Mild deficits observed, not formally tested                                          Communication Communication Communication: No apparent difficulties  Cognition Arousal: Alert Behavior During Therapy: WFL for tasks assessed/performed   PT - Cognitive impairments: No apparent impairments                         Following commands: Intact      Cueing Cueing Techniques: Verbal cues  Exercises Total Joint Exercises Ankle Circles/Pumps: AROM, Both, 15 reps, Supine Quad Sets: AROM, Both, 10 reps, Supine Heel Slides: AAROM, Right, 20 reps, Supine Hip ABduction/ADduction: AAROM, Right, 15 reps, Supine Long Arc Quad: AAROM, Right, 10 reps, Seated    General Comments        Pertinent Vitals/Pain Pain Assessment Pain Assessment: 0-10 Pain Score: 6  Pain Location: R hip Pain Descriptors / Indicators: Aching, Grimacing, Sore Pain Intervention(s): Limited activity within patient's tolerance, Monitored during session, Premedicated before session, Ice applied    Home Living                          Prior Function  PT Goals (current goals can now be found in the care plan section) Acute Rehab PT Goals Patient Stated Goal: Regain IND, decreased pain PT Goal Formulation: With patient Time For Goal Achievement: 05/02/24 Potential to Achieve Goals: Good Progress towards PT goals: Progressing toward goals    Frequency    7X/week      PT Plan      Co-evaluation              AM-PAC PT "6 Clicks" Mobility   Outcome Measure  Help needed turning from your back to your side while in a flat bed without using bedrails?: A Little Help needed moving from lying on your back to sitting on the side of a flat bed without using bedrails?: A Little Help needed moving to  and from a bed to a chair (including a wheelchair)?: A Little Help needed standing up from a chair using your arms (e.g., wheelchair or bedside chair)?: A Little Help needed to walk in hospital room?: A Little Help needed climbing 3-5 steps with a railing? : A Lot 6 Click Score: 17    End of Session Equipment Utilized During Treatment: Gait belt Activity Tolerance: Patient tolerated treatment well Patient left: in chair;with call bell/phone within reach;with chair alarm set;with family/visitor present Nurse Communication: Mobility status PT Visit Diagnosis: Difficulty in walking, not elsewhere classified (R26.2)     Time: 0454-0981 PT Time Calculation (min) (ACUTE ONLY): 31 min  Charges:    $Gait Training: 8-22 mins $Therapeutic Exercise: 8-22 mins PT General Charges $$ ACUTE PT VISIT: 1 Visit                     Robert Cruz PT Acute Rehabilitation Services Pager [phone] Office [phone]    Robert Cruz 04/26/2024, 12:20 PM

## 2024-04-26 NOTE — Plan of Care (Signed)
 Problem: Education: Goal: Knowledge of General Education information will improve Description: Including pain rating scale, medication(s)/side effects and non-pharmacologic comfort measures Outcome: Adequate for Discharge   Problem: Health Behavior/Discharge Planning: Goal: Ability to manage health-related needs will improve Outcome: Adequate for Discharge   Problem: Clinical Measurements: Goal: Ability to maintain clinical measurements within normal limits will improve Outcome: Adequate for Discharge Goal: Will remain free from infection Outcome: Adequate for Discharge Goal: Diagnostic test results will improve Outcome: Adequate for Discharge Goal: Respiratory complications will improve Outcome: Adequate for Discharge Goal: Cardiovascular complication will be avoided Outcome: Adequate for Discharge   Problem: Activity: Goal: Risk for activity intolerance will decrease Outcome: Adequate for Discharge   Problem: Nutrition: Goal: Adequate nutrition will be maintained Outcome: Adequate for Discharge   Problem: Coping: Goal: Level of anxiety will decrease Outcome: Adequate for Discharge   Problem: Elimination: Goal: Will not experience complications related to bowel motility Outcome: Adequate for Discharge Goal: Will not experience complications related to urinary retention Outcome: Adequate for Discharge   Problem: Pain Managment: Goal: General experience of comfort will improve and/or be controlled Outcome: Adequate for Discharge   Problem: Safety: Goal: Ability to remain free from injury will improve Outcome: Adequate for Discharge   Problem: Skin Integrity: Goal: Risk for impaired skin integrity will decrease Outcome: Adequate for Discharge   Problem: Education: Goal: Knowledge of the prescribed therapeutic regimen will improve Outcome: Adequate for Discharge Goal: Understanding of discharge needs will improve Outcome: Adequate for Discharge Goal:  Individualized Educational Video(s) Outcome: Adequate for Discharge   Problem: Activity: Goal: Ability to avoid complications of mobility impairment will improve Outcome: Adequate for Discharge Goal: Ability to tolerate increased activity will improve Outcome: Adequate for Discharge   Problem: Clinical Measurements: Goal: Postoperative complications will be avoided or minimized Outcome: Adequate for Discharge   Problem: Pain Management: Goal: Pain level will decrease with appropriate interventions Outcome: Adequate for Discharge   Problem: Skin Integrity: Goal: Will show signs of wound healing Outcome: Adequate for Discharge   Problem: Education: Goal: Knowledge of General Education information will improve Description: Including pain rating scale, medication(s)/side effects and non-pharmacologic comfort measures Outcome: Adequate for Discharge   Problem: Health Behavior/Discharge Planning: Goal: Ability to manage health-related needs will improve Outcome: Adequate for Discharge   Problem: Clinical Measurements: Goal: Ability to maintain clinical measurements within normal limits will improve Outcome: Adequate for Discharge Goal: Will remain free from infection Outcome: Adequate for Discharge Goal: Diagnostic test results will improve Outcome: Adequate for Discharge Goal: Respiratory complications will improve Outcome: Adequate for Discharge Goal: Cardiovascular complication will be avoided Outcome: Adequate for Discharge   Problem: Activity: Goal: Risk for activity intolerance will decrease Outcome: Adequate for Discharge   Problem: Nutrition: Goal: Adequate nutrition will be maintained Outcome: Adequate for Discharge   Problem: Coping: Goal: Level of anxiety will decrease Outcome: Adequate for Discharge   Problem: Elimination: Goal: Will not experience complications related to bowel motility Outcome: Adequate for Discharge Goal: Will not experience  complications related to urinary retention Outcome: Adequate for Discharge   Problem: Pain Managment: Goal: General experience of comfort will improve and/or be controlled Outcome: Adequate for Discharge   Problem: Safety: Goal: Ability to remain free from injury will improve Outcome: Adequate for Discharge   Problem: Skin Integrity: Goal: Risk for impaired skin integrity will decrease Outcome: Adequate for Discharge   Problem: Education: Goal: Knowledge of the prescribed therapeutic regimen will improve Outcome: Adequate for Discharge Goal: Understanding of discharge needs will improve  Outcome: Adequate for Discharge Goal: Individualized Educational Video(s) Outcome: Adequate for Discharge   Problem: Activity: Goal: Ability to avoid complications of mobility impairment will improve Outcome: Adequate for Discharge Goal: Ability to tolerate increased activity will improve Outcome: Adequate for Discharge   Problem: Clinical Measurements: Goal: Postoperative complications will be avoided or minimized Outcome: Adequate for Discharge   Problem: Pain Management: Goal: Pain level will decrease with appropriate interventions Outcome: Adequate for Discharge   Problem: Skin Integrity: Goal: Will show signs of wound healing Outcome: Adequate for Discharge

## 2024-04-26 NOTE — Discharge Summary (Signed)
 Patient ID: PRIDE GONZALES MRN: [id number] DOB/AGE: Dec 17, 1975 48 y.o.  Admit date: 04/25/2024 Discharge date: 04/26/2024  Admission Diagnoses:  Principal Problem:   Unilateral primary osteoarthritis, right hip Active Problems:   Status post total replacement of right hip   Discharge Diagnoses:  Same  Past Medical History:  Diagnosis Date   Arthritis    gout and osteoarthritis   Chicken pox    Chronic kidney disease    GERD (gastroesophageal reflux disease)    Gout 2022   Hypertension    Morbid obesity (HCC)    Pre-diabetes    Sleep apnea    uses CPAP    Surgeries: Procedure(s): ARTHROPLASTY, HIP, TOTAL, ANTERIOR APPROACH on 04/25/2024   Consultants:   Discharged Condition: Improved  Hospital Course: COLLIER BOHNET is an 48 y.o. male who was admitted 04/25/2024 for operative treatment ofUnilateral primary osteoarthritis, right hip. Patient has severe unremitting pain that affects sleep, daily activities, and work/hobbies. After pre-op clearance the patient was taken to the operating room on 04/25/2024 and underwent  Procedure(s): ARTHROPLASTY, HIP, TOTAL, ANTERIOR APPROACH.    Patient was given perioperative antibiotics:  Anti-infectives (From admission, onward)    Start     Dose/Rate Route Frequency Ordered Stop   04/25/24 1330  ceFAZolin (ANCEF) IVPB 2g/100 mL premix        2 g 200 mL/hr over 30 Minutes Intravenous Every 6 hours 04/25/24 1034 04/25/24 1923   04/25/24 0600  ceFAZolin (ANCEF) IVPB 3g/150 mL premix        3 g 300 mL/hr over 30 Minutes Intravenous On call to O.R. 04/25/24 7846 04/25/24 0758        Patient was given sequential compression devices, early ambulation, and chemoprophylaxis to prevent DVT.  Patient benefited maximally from hospital stay and there were no complications.    Recent vital signs: Patient Vitals for the past 24 hrs:  BP Temp Temp src Pulse Resp SpO2  04/26/24 1013 115/82 (!) 97.5 F (36.4 C) Oral 78 17 97 %  04/26/24  0617 117/81 98.2 F (36.8 C) -- 81 18 100 %  04/26/24 0205 124/71 98.6 F (37 C) -- 74 16 99 %  04/25/24 2234 131/83 98.3 F (36.8 C) -- 77 15 99 %  04/25/24 2025 -- -- -- 77 18 100 %     Recent laboratory studies:  Recent Labs    04/26/24 0152  WBC 16.5*  HGB 11.1*  HCT 33.2*  PLT 352  NA 134*  K 3.9  CL 102  CO2 22  BUN 21*  CREATININE 1.22  GLUCOSE 121*  CALCIUM  8.7*     Discharge Medications:   Allergies as of 04/26/2024       Reactions   Amlodipine  Other (See Comments)   edema   Bystolic  [nebivolol  Hcl] Other (See Comments)   Patient stated medication gave him major headaches        Medication List     STOP taking these medications    methocarbamol  500 MG tablet Commonly known as: ROBAXIN    traMADol  50 MG tablet Commonly known as: ULTRAM        TAKE these medications    allopurinol  100 MG tablet Commonly known as: ZYLOPRIM  Take 200 mg by mouth in the morning.   alum hydroxide-mag trisilicate 80-20 MG Chew chewable tablet Commonly known as: GAVISCON Chew 2 tablets by mouth 4 (four) times daily as needed for indigestion or heartburn.   aspirin  81 MG chewable tablet Chew 1 tablet (81 mg total)  by mouth 2 (two) times daily.   atenolol  100 MG tablet Commonly known as: TENORMIN  Take 1 tablet (100 mg total) by mouth daily. Must keep upcoming appt for refills   atorvastatin  40 MG tablet Commonly known as: LIPITOR Take 1 tablet (40 mg total) by mouth daily.   hydrochlorothiazide  25 MG tablet Commonly known as: HYDRODIURIL  Take 1 tablet (25 mg total) by mouth daily.   lisinopril  20 MG tablet Commonly known as: ZESTRIL  Take 1 tablet (20 mg total) by mouth daily. Must keep upcoming appt for refills   omeprazole -sodium bicarbonate  40-1100 MG capsule Commonly known as: ZEGERID  TAKE 1 CAPSULE BY MOUTH ONCE DAILY BEFORE BREAKFAST   oxyCODONE  5 MG immediate release tablet Commonly known as: Oxy IR/ROXICODONE  Take 1-2 tablets (5-10 mg total)  by mouth every 6 (six) hours as needed for moderate pain (pain score 4-6) (pain score 4-6).   spironolactone  100 MG tablet Commonly known as: ALDACTONE  TAKE 1 TABLET BY MOUTH EVERY DAY   tirzepatide  7.5 MG/0.5ML Pen Commonly known as: MOUNJARO  Inject 7.5 mg into the skin once a week. What changed: when to take this   tiZANidine  4 MG tablet Commonly known as: Zanaflex  Take 1 tablet (4 mg total) by mouth every 6 (six) hours as needed for muscle spasms.               Durable Medical Equipment  (From admission, onward)           Start     Ordered   04/25/24 1034  DME 3 n 1  Once        04/25/24 1034   04/25/24 1034  DME Walker rolling  Once       Question Answer Comment  Walker: With 5 Inch Wheels   Patient needs a walker to treat with the following condition Status post total replacement of right hip      04/25/24 1034            Diagnostic Studies: DG Pelvis Portable Result Date: 04/25/2024 CLINICAL DATA:  Postoperative right hip. EXAM: PORTABLE PELVIS 1-2 VIEWS COMPARISON:  Pelvis and right hip radiographs 03/11/2024 FINDINGS: Interval total right hip arthroplasty. No perihardware lucency is seen to indicate hardware failure or loosening. Expected postoperative changes including lateral right hip subcutaneous air and intra-articular air. Lateral right hip surgical skin staples. Moderate superior left femoroacetabular joint space narrowing and peripheral acetabular and femoral head-neck junction degenerative osteophytes. Mild bilateral sacroiliac joint space narrowing with moderate subchondral sclerosis. The pubic symphysis joint space is maintained. IMPRESSION: Interval total right hip arthroplasty without evidence of hardware failure. Electronically Signed   By: Bertina Broccoli M.D.   On: 04/25/2024 12:48   DG HIP UNILAT WITH PELVIS 1V RIGHT Result Date: 04/25/2024 CLINICAL DATA:  Total right hip arthroplasty. Intraoperative fluoroscopy. EXAM: DG HIP (WITH OR WITHOUT  PELVIS) 1V RIGHT COMPARISON:  Pelvis and right hip radiographs 03/11/2024 FINDINGS: Images were performed intraoperatively without the presence of a radiologist. Interval total right hip arthroplasty. No hardware complication is seen. Total fluoroscopy images: 3 Total fluoroscopy time: 23 seconds Total dose: Radiation Exposure Index (as provided by the fluoroscopic device): 4.84 mGy air Kerma Please see intraoperative findings for further detail. IMPRESSION: Intraoperative fluoroscopy for total right hip arthroplasty. Electronically Signed   By: Bertina Broccoli M.D.   On: 04/25/2024 12:26   DG C-Arm 1-60 Min-No Report Result Date: 04/25/2024 Fluoroscopy was utilized by the requesting physician.  No radiographic interpretation.   DG C-Arm 1-60 Min-No Report  Result Date: 04/25/2024 Fluoroscopy was utilized by the requesting physician.  No radiographic interpretation.    Disposition: Discharge disposition: 01-Home or Self Care          Follow-up Information     Arnie Lao, MD Follow up in 2 week(s).   Specialty: Orthopedic Surgery Contact information: [address] [place]  [place] [address] [phone]                  Signed: Arnie Lao 04/26/2024, 2:28 PM

## 2024-04-26 NOTE — TOC Transition Note (Signed)
 Transition of Care [facility]) - Discharge Note   Patient Details  Name: Robert Cruz MRN: [id number] Date of Birth: 1976-06-12  [facility] CM/SW Contact:  Tessie Fila, RN Phone Number: 04/26/2024, 10:22 AM   Clinical Narrative:    Met with pt and pt spt spouse at bedside. Pt confirms that he has OP Rehab set up and has received his appt date and time. Order for RW given to [facility] with [employer]. RW delivered to pt room. There are no other TOC needs at this time.   Final next level of care: OP Rehab Barriers to Discharge: No Barriers Identified   Patient Goals and CMS Choice Patient states their goals for this hospitalization and ongoing recovery are:: To return home and build strength through OP Rehab CMS Medicare.gov Compare Post Acute Care list provided to:: Other (Comment Required) (NA) Choice offered to / list presented to : NA  ownership interest in [facility].provided to:: Parent NA    Discharge Placement                       Discharge Plan and Services Additional resources added to the After Visit Summary for                  DME Arranged: Walker rolling DME Agency: [employer] Date DME Agency Contacted: 04/26/24 Time DME Agency Contacted: 0900 Representative spoke with at DME Agency: Lavonia Powers with [employer] HH Arranged: NA HH Agency: NA        Social Drivers of Health (SDOH) Interventions SDOH Screenings   Food Insecurity: No Food Insecurity (04/25/2024)  Housing: Low Risk  (04/25/2024)  Transportation Needs: No Transportation Needs (04/25/2024)  Utilities: Not At Risk (04/25/2024)  Depression (PHQ2-9): Low Risk  (08/04/2023)  Tobacco Use: High Risk (04/25/2024)     Readmission Risk Interventions     No data to display

## 2024-04-26 NOTE — Progress Notes (Signed)
 Physical Therapy Treatment Patient Details Name: Robert Cruz MRN: [id number] DOB: [birth year] Today's Date: 04/26/2024   History of Present Illness Pt s/p R THR adn with hx of DM, obesity, CKD, and bilat rotator cuff tears    PT Comments  Pt continues in good spirits, pain well controlled and progressing well with mobility.  Pt reviewed written HEP with progression, reviewed car transfers, up to ambulate in hall and negotiated stairs.  Pt eager for dc home this date.    If plan is discharge home, recommend the following: A little help with walking and/or transfers;A little help with bathing/dressing/bathroom;Assistance with cooking/housework;Help with stairs or ramp for entrance;Assist for transportation   Can travel by private vehicle        Equipment Recommendations  Rolling walker (2 wheels)    Recommendations for Other Services       Precautions / Restrictions Precautions Precautions: Fall Precaution/Restrictions Comments: Pt orthostatic on eval Restrictions Weight Bearing Restrictions Per Provider Order: No Other Position/Activity Restrictions: WBAT     Mobility  Bed Mobility Overal bed mobility: Needs Assistance Bed Mobility: Supine to Sit     Supine to sit: Supervision     General bed mobility comments: Increaed time, pt self assisting R LE with gait belt    Transfers Overall transfer level: Needs assistance Equipment used: Rolling walker (2 wheels) Transfers: Sit to/from Stand Sit to Stand: Supervision           General transfer comment: min cues for LE management and use of UEs to self assist    Ambulation/Gait Ambulation/Gait assistance: Supervision Gait Distance (Feet): 75 Feet Assistive device: Rolling walker (2 wheels) Gait Pattern/deviations: Step-to pattern, Step-through pattern, Decreased step length - right, Decreased step length - left, Shuffle, Trunk flexed Gait velocity: decr     General Gait Details: cues for posture and position  from RW   Stairs Stairs: Yes Stairs assistance: Contact guard assist Stair Management: One rail Right, Step to pattern, Forwards, With crutches Number of Stairs: 3 General stair comments: cues for sequence and foot/crutch placement   Wheelchair Mobility     Tilt Bed    Modified Rankin (Stroke Patients Only)       Balance Overall balance assessment: Mild deficits observed, not formally tested                                          Communication Communication Communication: No apparent difficulties  Cognition Arousal: Alert Behavior During Therapy: WFL for tasks assessed/performed   PT - Cognitive impairments: No apparent impairments                         Following commands: Intact      Cueing Cueing Techniques: Verbal cues  Exercises Total Joint Exercises Ankle Circles/Pumps: AROM, Both, 15 reps, Supine Quad Sets: AROM, Both, 10 reps, Supine Heel Slides: AAROM, Right, 20 reps, Supine Hip ABduction/ADduction: AAROM, Right, 15 reps, Supine Long Arc Quad: AAROM, Right, 10 reps, Seated    General Comments        Pertinent Vitals/Pain Pain Assessment Pain Assessment: 0-10 Pain Score: 6  Pain Location: R hip Pain Descriptors / Indicators: Aching, Sore Pain Intervention(s): Limited activity within patient's tolerance, Monitored during session, Premedicated before session, Ice applied    Home Living  Prior Function            PT Goals (current goals can now be found in the care plan section) Acute Rehab PT Goals Patient Stated Goal: Regain IND, decreased pain PT Goal Formulation: With patient Time For Goal Achievement: 05/02/24 Potential to Achieve Goals: Good Progress towards PT goals: Progressing toward goals    Frequency    7X/week      PT Plan      Co-evaluation              AM-PAC PT "6 Clicks" Mobility   Outcome Measure  Help needed turning from your back to  your side while in a flat bed without using bedrails?: A Little Help needed moving from lying on your back to sitting on the side of a flat bed without using bedrails?: A Little Help needed moving to and from a bed to a chair (including a wheelchair)?: A Little Help needed standing up from a chair using your arms (e.g., wheelchair or bedside chair)?: A Little Help needed to walk in hospital room?: A Little Help needed climbing 3-5 steps with a railing? : A Little 6 Click Score: 18    End of Session Equipment Utilized During Treatment: Gait belt Activity Tolerance: Patient tolerated treatment well Patient left: in chair;with call bell/phone within reach;with chair alarm set;with family/visitor present Nurse Communication: Mobility status PT Visit Diagnosis: Difficulty in walking, not elsewhere classified (R26.2)     Time: 9604-5409 PT Time Calculation (min) (ACUTE ONLY): 21 min  Charges:    $Gait Training: 8-22 mins $Therapeutic Exercise: 8-22 mins $Therapeutic Activity: 8-22 mins PT General Charges $$ ACUTE PT VISIT: 1 Visit                     Thedora Finlay PT Acute Rehabilitation Services Pager [phone] Office [phone]    Robert Cruz 04/26/2024, 12:36 PM

## 2024-04-26 NOTE — Progress Notes (Signed)
 Subjective: 1 Day Post-Op Procedure(s) (LRB): ARTHROPLASTY, HIP, TOTAL, ANTERIOR APPROACH (Right) Patient reports pain as moderate.    Objective: Vital signs in last 24 hours: Temp:  [97 F (36.1 C)-98.6 F (37 C)] 98.2 F (36.8 C) (05/23 0617) Pulse Rate:  [63-85] 81 (05/23 0617) Resp:  [11-21] 18 (05/23 0617) BP: (96-131)/(65-93) 117/81 (05/23 0617) SpO2:  [96 %-100 %] 100 % (05/23 0617) FiO2 (%):  [21 %] 21 % (05/22 2025)  Intake/Output from previous day: 05/22 0701 - 05/23 0700 In: 2560.7 [P.O.:720; I.V.:1490.7; IV Piggyback:350] Out: 2300 [Urine:1900; Blood:400] Intake/Output this shift: No intake/output data recorded.  Recent Labs    04/26/24 0152  HGB 11.1*   Recent Labs    04/26/24 0152  WBC 16.5*  RBC 3.95*  HCT 33.2*  PLT 352   Recent Labs    04/26/24 0152  NA 134*  K 3.9  CL 102  CO2 22  BUN 21*  CREATININE 1.22  GLUCOSE 121*  CALCIUM  8.7*   No results for input(s): "LABPT", "INR" in the last 72 hours.  Sensation intact distally Intact pulses distally Dorsiflexion/Plantar flexion intact Incision: dressing C/D/I   Assessment/Plan: 1 Day Post-Op Procedure(s) (LRB): ARTHROPLASTY, HIP, TOTAL, ANTERIOR APPROACH (Right) Up with therapy Discharge home with home health later today.      Robert Cruz 04/26/2024, 7:24 AM

## 2024-05-01 ENCOUNTER — Telehealth: Payer: Self-pay | Admitting: Orthopaedic Surgery

## 2024-05-01 ENCOUNTER — Other Ambulatory Visit: Payer: Self-pay | Admitting: Orthopaedic Surgery

## 2024-05-01 MED ORDER — OXYCODONE HCL 5 MG PO TABS: 5.0000 mg | ORAL_TABLET | Freq: Four times a day (QID) | ORAL | 0 refills | Status: DC | PRN

## 2024-05-01 NOTE — Telephone Encounter (Signed)
(  Patient s/p THA 04/25/24) Patient requesting refill on Oxycodone /[facility] [address], . Callback [phone]

## 2024-05-05 ENCOUNTER — Emergency Department (HOSPITAL_BASED_OUTPATIENT_CLINIC_OR_DEPARTMENT_OTHER)
Admission: EM | Admit: 2024-05-05 | Discharge: 2024-05-05 | Disposition: A | Discharge status: Home / Self Care | Attending: Emergency Medicine | Admitting: Emergency Medicine

## 2024-05-05 DIAGNOSIS — N189 Chronic kidney disease, unspecified: Secondary | ICD-10-CM | POA: Insufficient documentation

## 2024-05-05 DIAGNOSIS — K0889 Other specified disorders of teeth and supporting structures: Secondary | ICD-10-CM | POA: Insufficient documentation

## 2024-05-05 DIAGNOSIS — I129 Hypertensive chronic kidney disease with stage 1 through stage 4 chronic kidney disease, or unspecified chronic kidney disease: Secondary | ICD-10-CM | POA: Diagnosis not present

## 2024-05-05 DIAGNOSIS — Z7982 Long term (current) use of aspirin: Secondary | ICD-10-CM | POA: Insufficient documentation

## 2024-05-05 MED ORDER — PENICILLIN V POTASSIUM 500 MG PO TABS: 500.0000 mg | ORAL_TABLET | Freq: Four times a day (QID) | ORAL | 0 refills | Status: AC

## 2024-05-05 NOTE — ED Provider Notes (Signed)
 [facility] AT [facility] Provider Note   CSN: [id number] Arrival date & time: 05/05/24  3086     History  Chief Complaint  Patient presents with   Dental Pain    Robert Cruz is a 48 y.o. male.  Patient is a 48 year old male who presents with pain in his right lower tooth.  It started about 5 days ago.  Is gotten worse over the last few days.  He feels like he has had some swelling to the right side of the face.  No known fevers.  No vomiting.  He has a dentist that he is going to call tomorrow for follow-up.  He had a recent hip replacement and he has been doing well from that.  He has tried some of the oxycodone  that he had from the surgery to help control his tooth pain.       Home Medications Prior to Admission medications   Medication Sig Start Date End Date Taking? Authorizing Provider  penicillin  v potassium (VEETID) 500 MG tablet Take 1 tablet (500 mg total) by mouth 4 (four) times daily for 7 days. 05/05/24 05/12/24 Yes Hershel Los, MD  allopurinol  (ZYLOPRIM ) 100 MG tablet Take 200 mg by mouth in the morning.    [provider]  alum hydroxide-mag trisilicate (GAVISCON) 80-20 MG CHEW chewable tablet Chew 2 tablets by mouth 4 (four) times daily as needed for indigestion or heartburn.    [provider]  aspirin  81 MG chewable tablet Chew 1 tablet (81 mg total) by mouth 2 (two) times daily. 04/26/24   Arnie Lao, MD  atenolol  (TENORMIN ) 100 MG tablet Take 1 tablet (100 mg total) by mouth daily. Must keep upcoming appt for refills 03/27/24   Nahser, Lela Purple, MD  atorvastatin  (LIPITOR) 40 MG tablet Take 1 tablet (40 mg total) by mouth daily. 03/27/24   Nahser, Lela Purple, MD  hydrochlorothiazide  (HYDRODIURIL ) 25 MG tablet Take 1 tablet (25 mg total) by mouth daily. 03/27/24   Nahser, Lela Purple, MD  lisinopril  (ZESTRIL ) 20 MG tablet Take 1 tablet (20 mg total) by mouth daily. Must keep upcoming appt for refills 02/26/24    Nahser, Lela Purple, MD  omeprazole -sodium bicarbonate  (ZEGERID ) 40-1100 MG capsule TAKE 1 CAPSULE BY MOUTH ONCE DAILY BEFORE BREAKFAST 04/22/24   Albertina Hugger, MD  oxyCODONE  (OXY IR/ROXICODONE ) 5 MG immediate release tablet Take 1-2 tablets (5-10 mg total) by mouth every 6 (six) hours as needed for moderate pain (pain score 4-6) (pain score 4-6). 05/01/24   Arnie Lao, MD  spironolactone  (ALDACTONE ) 100 MG tablet TAKE 1 TABLET BY MOUTH EVERY DAY 04/18/24   Nahser, Lela Purple, MD  tirzepatide  (MOUNJARO ) 7.5 MG/0.5ML Pen Inject 7.5 mg into the skin once a week. Patient taking differently: Inject 7.5 mg into the skin every Sunday. 03/19/24   Henson, Vickie L, NP-C  tiZANidine  (ZANAFLEX ) 4 MG tablet Take 1 tablet (4 mg total) by mouth every 6 (six) hours as needed for muscle spasms. 04/26/24   Arnie Lao, MD      Allergies    Amlodipine  and Bystolic  [nebivolol  hcl]    Review of Systems   Review of Systems  Constitutional:  Negative for fever.  HENT:  Positive for dental problem and facial swelling. Negative for drooling, sore throat, trouble swallowing and voice change.   Respiratory:  Negative for shortness of breath.   Gastrointestinal:  Negative for nausea and vomiting.  Neurological:  Positive for headaches.  Physical Exam Updated Vital Signs BP 107/73 (BP Location: Right Arm)   Pulse 94   Temp 98.4 F (36.9 C) (Oral)   Resp 18   SpO2 98%  Physical Exam HENT:     Head: Normocephalic and atraumatic.     Mouth/Throat:     Mouth: Mucous membranes are moist.     Comments: Positive tenderness to his right lower back molar.  No induration or fluctuance.  No obvious facial swelling.  No trismus.  No elevation of the tongue. Cardiovascular:     Rate and Rhythm: Normal rate.  Pulmonary:     Effort: Pulmonary effort is normal.  Skin:    General: Skin is warm and dry.  Neurological:     Mental Status: He is alert.     ED Results / Procedures / Treatments    Labs (all labs ordered are listed, but only abnormal results are displayed) Labs Reviewed - No data to display  EKG None  Radiology No results found.  Procedures Procedures    Medications Ordered in ED Medications - No data to display  ED Course/ Medical Decision Making/ A&P                                 Medical Decision Making Risk Prescription drug management.   This patient presents to the ED for concern of dental pain, this involves an extensive number of treatment options, and is a complaint that carries with it a high risk of complications and morbidity.  I considered the following differential and admission for this acute, potentially life threatening condition.  The differential diagnosis includes dental abscess, impacted tooth, Ludwig angina, mass  MDM:    Patient has some localized tenderness around the tooth.  No appreciable abscess or facial swelling.  No evidence of deeper tissue infection.  No mass palpated.  He does not appear to be systemically ill.  (Labs, imaging, consults)  Additional history obtained from chart.  External records from outside source obtained and reviewed including recent notes   Reevaluation: After the interventions noted above, I reevaluated the patient and found that they have :stayed the same  Social Determinants of Health:  none  Disposition: Discharged to home in good condition.  Will follow-up with his dentist tomorrow.  Return precautions were given.  Co morbidities that complicate the patient evaluation  Past Medical History:  Diagnosis Date   Arthritis    gout and osteoarthritis   Chicken pox    Chronic kidney disease    GERD (gastroesophageal reflux disease)    Gout 2022   Hypertension    Morbid obesity (HCC)    Pre-diabetes    Sleep apnea    uses CPAP     Medicines Meds ordered this encounter  Medications   penicillin  v potassium (VEETID) 500 MG tablet    Sig: Take 1 tablet (500 mg total) by mouth 4  (four) times daily for 7 days.    Dispense:  28 tablet    Refill:  0    I have reviewed the patients home medicines and have made adjustments as needed  Problem List / ED Course: Problem List Items Addressed This Visit   None Visit Diagnoses       Pain, dental    -  Primary             Final Clinical Impression(s) / ED Diagnoses Final diagnoses:  Pain, dental  Rx / DC Orders ED Discharge Orders          Ordered    penicillin  v potassium (VEETID) 500 MG tablet  4 times daily        05/05/24 1610              Hershel Los, MD 05/05/24 [phone]

## 2024-05-05 NOTE — Discharge Instructions (Addendum)
 Make an appointment to follow-up with your dentist or oral surgeon as soon as possible.  Return to the emergency room if you have any worsening symptoms.

## 2024-05-05 NOTE — ED Triage Notes (Signed)
 C/o R lower wisdom tooth pain x3 days. Sensitivity to R side of face. Denies fever. Hip replacement 10 days ago.

## 2024-05-05 NOTE — ED Notes (Signed)
 Discharge paperwork given and verbally understood.

## 2024-05-08 ENCOUNTER — Other Ambulatory Visit: Payer: Self-pay

## 2024-05-08 ENCOUNTER — Other Ambulatory Visit (HOSPITAL_BASED_OUTPATIENT_CLINIC_OR_DEPARTMENT_OTHER): Payer: Self-pay

## 2024-05-08 ENCOUNTER — Emergency Department (HOSPITAL_BASED_OUTPATIENT_CLINIC_OR_DEPARTMENT_OTHER)
Admission: EM | Admit: 2024-05-08 | Discharge: 2024-05-08 | Disposition: A | Discharge status: Home / Self Care | Attending: Emergency Medicine | Admitting: Emergency Medicine

## 2024-05-08 ENCOUNTER — Encounter (HOSPITAL_BASED_OUTPATIENT_CLINIC_OR_DEPARTMENT_OTHER): Payer: Self-pay | Admitting: Emergency Medicine

## 2024-05-08 DIAGNOSIS — N189 Chronic kidney disease, unspecified: Secondary | ICD-10-CM | POA: Insufficient documentation

## 2024-05-08 DIAGNOSIS — K047 Periapical abscess without sinus: Secondary | ICD-10-CM | POA: Insufficient documentation

## 2024-05-08 DIAGNOSIS — Z7982 Long term (current) use of aspirin: Secondary | ICD-10-CM | POA: Diagnosis not present

## 2024-05-08 DIAGNOSIS — K0889 Other specified disorders of teeth and supporting structures: Secondary | ICD-10-CM | POA: Diagnosis present

## 2024-05-08 DIAGNOSIS — Z79899 Other long term (current) drug therapy: Secondary | ICD-10-CM | POA: Diagnosis not present

## 2024-05-08 DIAGNOSIS — I129 Hypertensive chronic kidney disease with stage 1 through stage 4 chronic kidney disease, or unspecified chronic kidney disease: Secondary | ICD-10-CM | POA: Diagnosis not present

## 2024-05-08 MED ORDER — AMOXICILLIN-POT CLAVULANATE 875-125 MG PO TABS
1.0000 | ORAL_TABLET | Freq: Two times a day (BID) | ORAL | 0 refills | Status: AC
  Filled 2024-05-08: qty 28, 14d supply, fill #0

## 2024-05-08 MED ORDER — VALACYCLOVIR HCL 1 G PO TABS
1000.0000 mg | ORAL_TABLET | Freq: Three times a day (TID) | ORAL | 0 refills | Status: AC
  Filled 2024-05-08: qty 21, 7d supply, fill #0

## 2024-05-08 NOTE — Discharge Instructions (Signed)
 Your history, exam, and evaluation today seem consistent with persistent dental infection that did not cleared up with the initial round of antibiotics.  We also discussed it being a salivary gland infection versus ear infection versus early discomfort related to shingles without rash.  We agreed together to escalate her antibiotic coverage and print a prescription as well for the antiviral to help with shingles if you develop a rash.  Please do not take this unless you get a rash.  Please follow-up with your dentist on Friday as we discussed and use your home pain medicine.  Please rest and stay hydrated.  If any symptoms change or worsen acutely, please return to the nearest emergency department.

## 2024-05-08 NOTE — ED Provider Notes (Signed)
 [place] EMERGENCY DEPARTMENT AT [facility] Provider Note   CSN: [id number] Arrival date & time: 05/08/24  4098     History  Chief Complaint  Patient presents with   Dental Pain    Robert Cruz is a 48 y.o. male.  The history is provided by the patient and medical records. No language interpreter was used.  Dental Pain Location:  Lower Lower teeth location:  32/RL 3rd molar Quality:  Aching and dull Severity:  Moderate Onset quality:  Gradual Duration:  1 week Timing:  Constant Progression:  Worsening Chronicity:  New Context: not dental fracture and not trauma   Relieved by:  Nothing Worsened by:  Nothing Ineffective treatments: penicilln VK. Associated symptoms: facial pain   Associated symptoms: no congestion, no drooling, no facial swelling, no fever, no gum swelling, no headaches, no neck pain, no neck swelling, no oral bleeding, no oral lesions and no trismus        Home Medications Prior to Admission medications   Medication Sig Start Date End Date Taking? Authorizing Provider  allopurinol  (ZYLOPRIM ) 100 MG tablet Take 200 mg by mouth in the morning.    [provider]  alum hydroxide-mag trisilicate (GAVISCON) 80-20 MG CHEW chewable tablet Chew 2 tablets by mouth 4 (four) times daily as needed for indigestion or heartburn.    [provider]  aspirin  81 MG chewable tablet Chew 1 tablet (81 mg total) by mouth 2 (two) times daily. 04/26/24   Arnie Lao, MD  atenolol  (TENORMIN ) 100 MG tablet Take 1 tablet (100 mg total) by mouth daily. Must keep upcoming appt for refills 03/27/24   Nahser, Lela Purple, MD  atorvastatin  (LIPITOR) 40 MG tablet Take 1 tablet (40 mg total) by mouth daily. 03/27/24   Nahser, Lela Purple, MD  hydrochlorothiazide  (HYDRODIURIL ) 25 MG tablet Take 1 tablet (25 mg total) by mouth daily. 03/27/24   Nahser, Lela Purple, MD  lisinopril  (ZESTRIL ) 20 MG tablet Take 1 tablet (20 mg total) by mouth daily. Must keep  upcoming appt for refills 02/26/24   Nahser, Lela Purple, MD  omeprazole -sodium bicarbonate  (ZEGERID ) 40-1100 MG capsule TAKE 1 CAPSULE BY MOUTH ONCE DAILY BEFORE BREAKFAST 04/22/24   Albertina Hugger, MD  oxyCODONE  (OXY IR/ROXICODONE ) 5 MG immediate release tablet Take 1-2 tablets (5-10 mg total) by mouth every 6 (six) hours as needed for moderate pain (pain score 4-6) (pain score 4-6). 05/01/24   Arnie Lao, MD  penicillin  v potassium (VEETID) 500 MG tablet Take 1 tablet (500 mg total) by mouth 4 (four) times daily for 7 days. 05/05/24 05/12/24  Hershel Los, MD  spironolactone  (ALDACTONE ) 100 MG tablet TAKE 1 TABLET BY MOUTH EVERY DAY 04/18/24   Nahser, Lela Purple, MD  tirzepatide  (MOUNJARO ) 7.5 MG/0.5ML Pen Inject 7.5 mg into the skin once a week. Patient taking differently: Inject 7.5 mg into the skin every Sunday. 03/19/24   Henson, Vickie L, NP-C  tiZANidine  (ZANAFLEX ) 4 MG tablet Take 1 tablet (4 mg total) by mouth every 6 (six) hours as needed for muscle spasms. 04/26/24   Arnie Lao, MD      Allergies    Amlodipine  and Bystolic  [nebivolol  hcl]    Review of Systems   Review of Systems  Constitutional:  Negative for chills, fatigue and fever.  HENT:  Positive for dental problem and ear pain. Negative for congestion, drooling, facial swelling, mouth sores, rhinorrhea, trouble swallowing and voice change.   Eyes:  Negative for pain,  redness and visual disturbance.  Respiratory:  Negative for shortness of breath.   Cardiovascular:  Negative for chest pain.  Gastrointestinal:  Negative for abdominal pain, constipation, diarrhea, nausea and vomiting.  Genitourinary:  Negative for flank pain.  Musculoskeletal:  Negative for back pain and neck pain.  Skin:  Negative for rash and wound.  Neurological:  Negative for weakness, light-headedness, numbness and headaches.       R face tingling  Psychiatric/Behavioral:  Negative for agitation and confusion.   All other systems  reviewed and are negative.   Physical Exam Updated Vital Signs BP 110/78   Pulse 100   Temp 98.5 F (36.9 C) (Oral)   Resp 20   Ht 6\' 2"  (1.88 m)   Wt 125.6 kg   SpO2 98%   BMI 35.56 kg/m  Physical Exam Vitals and nursing note reviewed.  Constitutional:      General: He is not in acute distress.    Appearance: He is well-developed. He is not ill-appearing.  HENT:     Head: Atraumatic. No contusion or laceration.     Jaw: Tenderness present.     Salivary Glands: Right salivary gland is tender. Left salivary gland is not tender.      Comments: Tenderness to right lower jaw and submandibular area.  No PTA or RPA on exam.  Minimal dental tenderness but some posterior right molar tenderness.  No drainage or abscess easily seen to drain.  No trismus.  No stridor.  No external neck tenderness lower but some to the jawline.  Tenderness near the ear but no evidence of otitis media or otitis externa.  No mastoid tenderness.  No rash to suggest shingles.  Eyes unremarkable.    Right Ear: Tympanic membrane normal.     Left Ear: Tympanic membrane normal.     Nose: No congestion or rhinorrhea.     Mouth/Throat:     Mouth: Mucous membranes are moist.     Pharynx: No oropharyngeal exudate or posterior oropharyngeal erythema.  Eyes:     Extraocular Movements: Extraocular movements intact.     Conjunctiva/sclera: Conjunctivae normal.     Pupils: Pupils are equal, round, and reactive to light.  Neck:     Vascular: No carotid bruit.  Cardiovascular:     Rate and Rhythm: Normal rate and regular rhythm.     Heart sounds: No murmur heard. Pulmonary:     Effort: Pulmonary effort is normal. No respiratory distress.     Breath sounds: Normal breath sounds. No wheezing, rhonchi or rales.  Chest:     Chest wall: No tenderness.  Abdominal:     Palpations: Abdomen is soft.     Tenderness: There is no abdominal tenderness. There is no right CVA tenderness, left CVA tenderness, guarding or rebound.   Musculoskeletal:        General: Tenderness present. No swelling.     Cervical back: Neck supple. No tenderness.  Skin:    General: Skin is warm and dry.     Capillary Refill: Capillary refill takes less than 2 seconds.     Findings: No erythema or rash.  Neurological:     General: No focal deficit present.     Mental Status: He is alert.  Psychiatric:        Mood and Affect: Mood normal.     ED Results / Procedures / Treatments   Labs (all labs ordered are listed, but only abnormal results are displayed) Labs Reviewed - No data to  display  EKG None  Radiology No results found.  Procedures Procedures    Medications Ordered in ED Medications - No data to display  ED Course/ Medical Decision Making/ A&P                                 Medical Decision Making   LIEV BROCKBANK is a 48 y.o. male with a past medical history significant for hypertension, GERD, gout, CKD, hyperlipidemia, recent right hip surgery, and recent dental infection who presents with continued right jaw pain.  According to patient, for the last week he has had pain in his right lower jaw near a tooth he is concerned about and was started on penicillin  VK.  He is scheduled to see a dentist on Friday but over the last day or 2 his pain has slightly worsened and is now closer to his ear.  He is worried it is not improving with the antibiotics.  He reports no fevers, chills, nausea, vomiting.  Denies constipation, diarrhea, or urinary changes now.  He reports no chest pain shortness breath or cough and denies any difficulty swallowing, breathing, or voice changes.  Denies any rash specifically no rash to look like shingles.  He does not report ear drainage or any vision changes.  No eye pain itself.  No trauma.  Patient was worried that the antibiotics were not helping it.  On exam, lungs clear.  Chest nontender.  Abdomen nontender.  No focal neurologic deficits.  No stridor.  Patient had some soreness and  tenderness at the right lower jawline and in the submandibular area.  No significant swelling seen.  No clear rash to suggest shingles and his eye exam was unremarkable.  Normal extraocular movements.  Your exam did not show evidence of otitis media or otitis externa.  Oropharyngeal exam did not show PTA or RPA.  No purulence or swelling seen at the jawline and he had minimal tenderness with palpation with a tongue depressor.  Patient otherwise well-appearing.  Clinically I suspect either dental pain that did not get cleared up with the initial antibiotics versus early sialoadenitis versus otitis media versus shingles with pain before the rash.  We decided to treat all the above it we will give versus referral Augmentin and he will see the dentist on Friday.  He will also be given a prescription for valacyclovir to take if he develops a shingles-like rash.  We also discussed doing an eye exam with drops and fluorescein and given his lack of any eye symptoms eye pain or rash on his face we agreed to hold on it at this time.  We also agreed to hold on extensive labs or CT imaging given his lack of significant swelling on exam or other symptoms.  Patient agreed with plan of care.  Patient understands return precautions and follow-up instructions and was discharged in good condition with understanding the plan of care.        Final Clinical Impression(s) / ED Diagnoses Final diagnoses:  Dental infection  Pain, dental    Rx / DC Orders ED Discharge Orders          Ordered    amoxicillin -clavulanate (AUGMENTIN) 875-125 MG tablet  Every 12 hours        05/08/24 0809    valACYclovir (VALTREX) 1000 MG tablet  3 times daily        05/08/24 0809  Clinical Impression: 1. Dental infection   2. Pain, dental     Disposition: Discharge  Condition: Good  I have discussed the results, Dx and Tx plan with the pt(& family if present). He/she/they expressed understanding and agree(s)  with the plan. Discharge instructions discussed at great length. Strict return precautions discussed and pt &/or family have verbalized understanding of the instructions. No further questions at time of discharge.    New Prescriptions   AMOXICILLIN -CLAVULANATE (AUGMENTIN) 875-125 MG TABLET    Take 1 tablet by mouth every 12 (twelve) hours for 14 days.   VALACYCLOVIR (VALTREX) 1000 MG TABLET    Take 1 tablet (1,000 mg total) by mouth 3 (three) times daily for 7 days.    Follow Up: Abram Abraham, NP-C [address] [place] [address] [phone]     your dentist on Friday        Sebastyan Snodgrass, Marine Sia, MD 05/08/24 [phone]

## 2024-05-09 ENCOUNTER — Ambulatory Visit (INDEPENDENT_AMBULATORY_CARE_PROVIDER_SITE_OTHER): Admitting: Physician Assistant

## 2024-05-09 ENCOUNTER — Encounter: Payer: Self-pay | Admitting: Physician Assistant

## 2024-05-09 DIAGNOSIS — Z96641 Presence of right artificial hip joint: Secondary | ICD-10-CM

## 2024-05-09 NOTE — Progress Notes (Addendum)
 HPI: Robert Cruz returns today status post right total hip arthroplasty 04/25/2024.  He states he is overall doing well.  He states has some pain but he is not taking any pain medications.  He has been on aspirin  for DVT prophylaxis.  He states that he was on aspirin  prior to surgery.  He is mostly carrying a cane to ambulate.  Currently on Augmentin  due to a tooth infection which presented postop.  He has been placed on Augmentin  for total of 14 days.  He was seen in the ER on 05/05/2024 due to right lower tooth pain.  Review of systems see HPI otherwise negative  Physical exam: General Well-developed well-nourished male no acute distress mood affect appropriate. Right hip: Ambulates without an antalgic gait.  Right hip surgical incision no signs of infection or dehiscence.  Slight area of erythema near the proximal incision.  Staples removed Steri-Strips applied.  Positive seroma 150 cc of serosanguineous fluid obtained.  Right calf supple nontender dorsiflexion plantarflexion right ankle intact.  Impression: Status post right total hip arthroplasty  Plan: Advised him that he should see a dentist soon as possible for the tooth.  He will continue his amoxicillin .  Will see him back in just 2 weeks for wound check.  Sooner if there is any questions concerns.  Questions were encouraged and answered at length.

## 2024-05-10 ENCOUNTER — Encounter: Payer: Self-pay | Admitting: Physical Therapy

## 2024-05-10 ENCOUNTER — Ambulatory Visit: Admitting: Physical Therapy

## 2024-05-10 ENCOUNTER — Other Ambulatory Visit: Payer: Self-pay

## 2024-05-10 DIAGNOSIS — R262 Difficulty in walking, not elsewhere classified: Secondary | ICD-10-CM

## 2024-05-10 DIAGNOSIS — M6281 Muscle weakness (generalized): Secondary | ICD-10-CM

## 2024-05-10 DIAGNOSIS — M25551 Pain in right hip: Secondary | ICD-10-CM

## 2024-05-10 DIAGNOSIS — R2681 Unsteadiness on feet: Secondary | ICD-10-CM

## 2024-05-10 NOTE — Therapy (Signed)
 OUTPATIENT PHYSICAL THERAPY LOWER EXTREMITY EVALUATION   Patient Name: Robert Cruz MRN: [id number] DOB:1976/10/24, 48 y.o., male Today's Date: 05/10/2024  END OF SESSION:  PT End of Session - 05/10/24 1155     Visit Number 1    Number of Visits 17    Date for PT Re-Evaluation 07/05/24    Authorization Type [employer]    Authorization Time Period 05/10/24 to 07/05/24    PT Start Time 1107   pt arrived a few minutes late   PT Stop Time 1143    PT Time Calculation (min) 36 min    Activity Tolerance Patient tolerated treatment well    Behavior During Therapy [facility] for tasks assessed/performed             Past Medical History:  Diagnosis Date   Arthritis    gout and osteoarthritis   Chicken pox    Chronic kidney disease    GERD (gastroesophageal reflux disease)    Gout 2022   Hypertension    Morbid obesity (HCC)    Pre-diabetes    Sleep apnea    uses CPAP   Past Surgical History:  Procedure Laterality Date   COLONOSCOPY W/ BIOPSIES     ESOPHAGOGASTRODUODENOSCOPY     x 3   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   Procedure: ARTHROPLASTY, HIP, TOTAL, ANTERIOR APPROACH;  Surgeon: Arnie Lao, MD;  Location: [facility] ORS;  Service: Orthopedics;  Laterality: Right;   Patient Active Problem List   Diagnosis Date Noted   Status post total replacement of right hip 04/25/2024   Unilateral primary osteoarthritis, right hip 04/01/2024   URI, acute 08/17/2023   Obesity (BMI 30-39.9) 08/04/2023   Type 2 diabetes mellitus with chronic kidney disease, without long-term current use of insulin (HCC) 08/04/2023   Traumatic complete tear of left rotator cuff 04/12/2023   Traumatic complete tear of right rotator cuff 04/12/2023   Lower respiratory infection 10/04/2022   Right Achilles tendinitis 06/03/2022   Chronic gout 03/30/2022   Diarrhea 12/16/2021   Stage 3a chronic kidney disease (HCC) 09/01/2021   Left elbow pain 10/18/2019   Acute cough 10/03/2019   Mixed  hyperlipidemia 09/08/2017   Allergic rhinitis 10/11/2016   Chronic migraine without aura without status migrainosus, not intractable 01/14/2016   Resistant hypertension 01/04/2016   Generalized headache 12/16/2015   Colon cancer screening 09/06/2013   GERD (gastroesophageal reflux disease) 09/06/2013   Dysphagia 02/08/2011   OSA (obstructive sleep apnea) 12/28/2008   Severe obesity (BMI >= 40) (HCC) 10/07/2008    PCP: Alyson Back NP-C   REFERRING PROVIDER: Arnie Lao, MD  REFERRING DIAG: Diagnosis [phone] (ICD-10-CM) - Status post right hip replacement  THERAPY DIAG:  Pain in right hip  Muscle weakness (generalized)  Difficulty in walking, not elsewhere classified  Unsteadiness on feet  Rationale for Evaluation and Treatment: Rehabilitation  ONSET DATE: R anterior THA surgery 04/25/24  SUBJECTIVE:   SUBJECTIVE STATEMENT:  Surgery was the 22nd, needed a RW at first but its much better. Got some fluid drained yesterday and staples removed, its much better. Everything is still a little difficult, most difficult is getting shoes on and getting in/out of cars. Its hard to get comfortable sleeping. Steps can be a challenge. After I do something for a long time it does bother me. I mostly carry the cane with me in case the hip starts feeling bad. Things can be difficult but everything is manageable.   PERTINENT HISTORY: See above  PAIN:  Are you having pain? Yes: NPRS scale: 4/10 Pain location: surgical hip Pain description: general soreness  Aggravating factors: doing too much  Relieving factors: rest and ice   PRECAUTIONS: Anterior hip  RED FLAGS: None   WEIGHT BEARING RESTRICTIONS: No  FALLS:  Has patient fallen in last 6 months? 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    PATIENT SURVEYS:   Patient-Specific Activity Scoring Scheme  "0" represents "unable to perform." "10" represents "able to perform at prior level. 0 1 2 3 4 5 6 7 8 9  10 (Date and Score)   Activity Eval     1. Tying shoes  1     2. Putting on socks  1     3. Walking up and down steps  4   4.    5.    Score 2    Total score = sum of the activity scores/number of activities Minimum detectable change (90%CI) for average score = 2 points Minimum detectable change (90%CI) for single activity score = 3 points     COGNITION: Overall cognitive status: Within functional limits for tasks assessed     SENSATION: Some numbness at incision and where swelling is   EDEMA:   As expected post-op       LOWER EXTREMITY MMT:  MMT Right eval Left eval  Hip flexion 3 pain limited  5  Hip extension    Hip abduction 3 slight pain 4  Hip adduction    Hip internal rotation    Hip external rotation    Knee flexion 4+ 4+  Knee extension 4 5  Ankle dorsiflexion    Ankle plantarflexion    Ankle inversion    Ankle eversion     (Blank rows = not tested)    FUNCTIONAL TESTS:  5 times sit to stand: 25.5 seconds offloads surgical LE Timed up and go (TUG): 23.5 seconds hurrycane  3 minute walk test: 355ft carried hurrycane but didn't use it   GAIT: Distance walked: 36ft Assistive device utilized: carried cane but did not use it  Level of assistance: Modified independence Comments: antalgic gait pattern, reduced gait speed                                                                                                                                 TREATMENT DATE:    05/10/24  Eval, POC, discussed general progression and expectations for recovery following anterior THA, continue with hospital HEP   Bridges x12 Supine hip  flexion to tolerance R LE x12 STS from high mat table cues to not shift off R LE x10    PATIENT EDUCATION:  Education details: as above  Person educated: Patient Education method: Programmer, multimedia, Facilities manager, and Handouts Education comprehension: verbalized understanding, returned demonstration, and needs further education  HOME EXERCISE PROGRAM:  Hospital HEP for now   ASSESSMENT:  CLINICAL IMPRESSION:   Patient is a 47 y.o. M who was seen today for physical therapy evaluation and treatment for Diagnosis Z96.641 (ICD-10-CM) - Status post right hip replacement. Presents as expected with no significant concerns following his procedure, his main concern is being able to get back to his rather physical job without difficulties or issues. Very eager to get back to work but has a good understanding that he does need time to heal and strengthen after surgery, very motivated.   OBJECTIVE IMPAIRMENTS: Abnormal gait, decreased activity tolerance, decreased balance, decreased knowledge of use of DME, decreased mobility, difficulty walking, decreased ROM, decreased strength, impaired flexibility, impaired sensation, obesity, and pain.   ACTIVITY LIMITATIONS: carrying, lifting, sitting, standing, squatting, sleeping, stairs, transfers, and locomotion level  PARTICIPATION LIMITATIONS: driving, shopping, community activity, occupation, and yard work  PERSONAL FACTORS: Age, Fitness, Past/current experiences, and Time since onset of injury/illness/exacerbation are also affecting patient's functional outcome.   REHAB POTENTIAL: Excellent  CLINICAL DECISION MAKING: Stable/uncomplicated  EVALUATION COMPLEXITY: Low   GOALS: Goals reviewed with patient? Yes  SHORT TERM GOALS: Target date: 06/07/2024   Will be compliant with appropriate  progressive HEP  Baseline: Goal status: INITIAL  2.  No pain with car transfers or bed mobility  Baseline:  Goal status: INITIAL  3.  Gait deviations to have normalized  Baseline:  Goal status: INITIAL  4.  Will complete 5xSTS test in 17 seconds or less without off-shifting from surgical LE  Baseline:  Goal status: INITIAL    LONG TERM GOALS: Target date: 07/05/2024    MMT to have improved by at least 1 grade in all weak groups  Baseline:  Goal status: INITIAL  2.  Will complete TUG and 5XSTS in 12 seconds or less no device  Baseline:  Goal status: INITIAL  3.  Will ambulate at least 535ft in 3WMT no device no gait deviations  Baseline:  Goal status: INITIAL  4.  Will be able to perform simulated work duties in clinic with good mechanics and no increased pain  Baseline:  Goal status: INITIAL  5.  PSFS to have improved by at least 3 points  Baseline:  Goal status: INITIAL     PLAN:  PT FREQUENCY: 2x/week  PT DURATION: 8 weeks  PLANNED INTERVENTIONS: 97750- Physical Performance Testing, 97110-Therapeutic exercises, 97530- Therapeutic activity, [id number]- Neuromuscular re-education, 97535- Self Care, 16109- Manual therapy, [phone]- Gait training, [phone]- Aquatic Therapy, 97016- Vasopneumatic device, and DME instructions  PLAN FOR NEXT SESSION: strength, functional activity tolerance, balance, promote good biomechanics especially for work related tasks   Terrel Ferries, PT, DPT 05/10/24 11:57 AM

## 2024-05-14 ENCOUNTER — Ambulatory Visit (INDEPENDENT_AMBULATORY_CARE_PROVIDER_SITE_OTHER): Admitting: Rehabilitative and Restorative Service Providers"

## 2024-05-14 ENCOUNTER — Encounter: Payer: Self-pay | Admitting: Rehabilitative and Restorative Service Providers"

## 2024-05-14 DIAGNOSIS — R262 Difficulty in walking, not elsewhere classified: Secondary | ICD-10-CM | POA: Diagnosis not present

## 2024-05-14 DIAGNOSIS — R2681 Unsteadiness on feet: Secondary | ICD-10-CM

## 2024-05-14 DIAGNOSIS — M6281 Muscle weakness (generalized): Secondary | ICD-10-CM

## 2024-05-14 DIAGNOSIS — M25551 Pain in right hip: Secondary | ICD-10-CM

## 2024-05-14 NOTE — Therapy (Signed)
 OUTPATIENT PHYSICAL THERAPY TREATMENT   Patient Name: Robert Cruz MRN: [id number] DOB:01-16-1976, 48 y.o., male Today's Date: 05/14/2024  END OF SESSION:  PT End of Session - 05/14/24 0945     Visit Number 2    Number of Visits 17    Date for PT Re-Evaluation 07/05/24    Authorization Type [employer]    Authorization Time Period 05/10/24 to 07/05/24    PT Start Time 0937    PT Stop Time 1015    PT Time Calculation (min) 38 min    Activity Tolerance Patient tolerated treatment well    Behavior During Therapy [facility] for tasks assessed/performed              Past Medical History:  Diagnosis Date   Arthritis    gout and osteoarthritis   Chicken pox    Chronic kidney disease    GERD (gastroesophageal reflux disease)    Gout 2022   Hypertension    Morbid obesity (HCC)    Pre-diabetes    Sleep apnea    uses CPAP   Past Surgical History:  Procedure Laterality Date   COLONOSCOPY W/ BIOPSIES     ESOPHAGOGASTRODUODENOSCOPY     x 3   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   Procedure: ARTHROPLASTY, HIP, TOTAL, ANTERIOR APPROACH;  Surgeon: Arnie Lao, MD;  Location: [facility];  Service: Orthopedics;  Laterality: Right;   Patient Active Problem List   Diagnosis Date Noted   Status post total replacement of right hip 04/25/2024   Unilateral primary osteoarthritis, right hip 04/01/2024   URI, acute 08/17/2023   Obesity (BMI 30-39.9) 08/04/2023   Type 2 diabetes mellitus with chronic kidney disease, without long-term current use of insulin (HCC) 08/04/2023   Traumatic complete tear of left rotator cuff 04/12/2023   Traumatic complete tear of right rotator cuff 04/12/2023   Lower respiratory infection 10/04/2022   Right Achilles tendinitis 06/03/2022   Chronic gout 03/30/2022   Diarrhea 12/16/2021   Stage 3a chronic kidney disease (HCC) 09/01/2021   Left elbow pain 10/18/2019   Acute cough 10/03/2019   Mixed hyperlipidemia 09/08/2017   Allergic rhinitis  10/11/2016   Chronic migraine without aura without status migrainosus, not intractable 01/14/2016   Resistant hypertension 01/04/2016   Generalized headache 12/16/2015   Colon cancer screening 09/06/2013   GERD (gastroesophageal reflux disease) 09/06/2013   Dysphagia 02/08/2011   OSA (obstructive sleep apnea) 12/28/2008   Severe obesity (BMI >= 40) (HCC) 10/07/2008    PCP: Alyson Back NP-C   REFERRING PROVIDER: Arnie Lao, MD  REFERRING DIAG: Diagnosis [phone] (ICD-10-CM) - Status post right hip replacement  THERAPY DIAG:  Pain in right hip  Muscle weakness (generalized)  Difficulty in walking, not elsewhere classified  Unsteadiness on feet  Rationale for Evaluation and Treatment: Rehabilitation  ONSET DATE: R anterior THA surgery 04/25/24  SUBJECTIVE:   SUBJECTIVE STATEMENT: Pt indicated fluid build up was noted.   PERTINENT HISTORY: See above   PAIN:  NPRS scale: 4/10 Pain location: surgical hip Pain description: general soreness  Aggravating factors: doing too much  Relieving factors: rest and ice   PRECAUTIONS: Anterior hip  RED FLAGS: None   WEIGHT BEARING RESTRICTIONS: No  FALLS:  Has patient fallen in last 6 months? 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    PATIENT SURVEYS:   Patient-Specific Activity Scoring Scheme  "0" represents "unable to perform." "10" represents "able to perform at prior level. 0 1 2 3 4 5 6 7 8 9  10 (Date and Score)   Activity Eval   05/10/2024    1. Tying shoes  1     2. Putting on socks  1     3. Walking up and down steps  4   4.    5.    Score 2    Total score = sum of the activity scores/number of activities Minimum detectable change (90%CI) for average score = 2 points Minimum detectable change (90%CI) for single activity score = 3 points  COGNITION: 05/10/2024 Overall cognitive status: Within functional limits for tasks assessed     SENSATION: 05/10/2024 Some numbness at incision and where swelling is   EDEMA:  05/10/2024 As expected post-op   LOWER EXTREMITY ROM:      Right 05/14/2024 Left 05/14/2024  Hip flexion 95 PROM in supine    Hip extension    Hip abduction    Hip adduction    Hip internal rotation    Hip external rotation 30 PROM in supine 70 deg hip flexion    Knee flexion 26 PROM in supine 70 deg hip flexion   Knee extension    Ankle dorsiflexion    Ankle plantarflexion    Ankle inversion    Ankle eversion     (Blank rows = not tested)   LOWER EXTREMITY MMT:  MMT Right Eval 05/10/2024 Left Eval 05/10/2024  Hip flexion 3 pain limited  5  Hip extension    Hip abduction 3 slight pain 4  Hip adduction    Hip internal rotation    Hip external rotation    Knee flexion 4+ 4+  Knee extension 4 5  Ankle dorsiflexion    Ankle plantarflexion    Ankle inversion    Ankle eversion     (Blank rows = not tested)    FUNCTIONAL TESTS:  05/10/2024 5 times sit to stand: 25.5 seconds offloads surgical LE Timed up and go (TUG): 23.5 seconds hurrycane  3 minute walk test: 343ft carried hurrycane but didn't use it   GAIT: 05/10/2024 Distance walked: 319ft Assistive device utilized: carried cane but did not use it  Level of assistance: Modified independence Comments: antalgic gait pattern, reduced gait speed                                                                                                                                               TREATMENT  DATE:  05/14/2024 Therex: Supine SKC 15 sec x 3 Rt leg  Supine figure 4 push away 15 sec x 3 Rt leg Supine bridge 2 x 10 2-3 sec hold  Sidelying Rt hip clam shell 2 x 15 Seated Rt leg quad set with SLR x 10 slowly   Nustep Lvl 5 for ROM 8 mins UE/LE   Review of handout for HEP.   Neuro Re-ed (muscle activation, balance control improvements) Tandem stance 1 min x 1 bilateral in corner Seated Rt leg quad set 5 sec hold x 10  Cues for techniques and cues for HEP use.    TREATMENT         DATE: 05/10/24  Eval, POC, discussed general progression and expectations for recovery following anterior THA, continue with hospital HEP   Bridges x12 Supine hip flexion to tolerance R LE x12 STS from high mat table cues to not shift off R LE x10    PATIENT EDUCATION:  05/10/2024 Education details:  HEP update Person educated: Patient Education method: Explanation, Demonstration, and Handouts Education comprehension: verbalized understanding, returned demonstration, and verbal cues required  HOME EXERCISE PROGRAM: Access Code: [email or web address] URL: [email or web address] Date: 05/14/2024 Prepared by: Bonna Bustard  Exercises - Supine Single Knee to Chest Stretch  - 2 x daily - 7 x weekly - 1 sets - 3-5 reps - 15 hold - Supine Figure 4 Piriformis Stretch  - 2 x daily - 7 x weekly - 1 sets - 5 reps - 15 hold - Supine Bridge  - 1-2 x daily - 7 x weekly - 1-2 sets - 10 reps - 2 hold - Clamshell (Mirrored)  - 1-2 x daily - 7 x weekly - 2-3 sets - 10-15 reps - Seated Quad Set  - 3-5 x daily - 7 x weekly - 1 sets - 10 reps - 5 hold - Tandem Stance in Corner  - 1 x daily - 7 x weekly - 1 sets - 2-3 reps - 30-60 hold   ASSESSMENT:  CLINICAL IMPRESSION: Assessment of Rt hip mobility was performed with limitations noted in passive range as measured today.  Established updated HEP with handout provided today.  Continued skilled PT services indicated to progress mobility, strength and balance for  improvement towards goals.   OBJECTIVE IMPAIRMENTS: Abnormal gait, decreased activity tolerance, decreased balance, decreased knowledge of use of DME, decreased mobility, difficulty walking, decreased ROM, decreased strength, impaired flexibility, impaired sensation, obesity, and pain.   ACTIVITY LIMITATIONS: carrying, lifting, sitting, standing, squatting, sleeping, stairs, transfers, and locomotion level  PARTICIPATION LIMITATIONS: driving, shopping, community activity, occupation, and yard work  PERSONAL FACTORS: Age, Fitness, Past/current experiences, and Time since onset of injury/illness/exacerbation are also affecting patient's functional outcome.   REHAB POTENTIAL: Excellent  CLINICAL DECISION MAKING: Stable/uncomplicated  EVALUATION COMPLEXITY: Low   GOALS: Goals reviewed with patient? Yes  SHORT TERM GOALS: Target date: 06/07/2024   Will be compliant with appropriate progressive HEP  Baseline: Goal status: on going 05/14/2024  2.  No pain with car transfers or bed mobility  Baseline:  Goal status: on going 05/14/2024  3.  Gait deviations to have normalized  Baseline:  Goal status: on going 05/14/2024  4.  Will complete 5xSTS test in 17 seconds or less without off-shifting from surgical LE  Baseline:  Goal status: on going 05/14/2024    LONG TERM GOALS: Target date: 07/05/2024    MMT to have improved by at least 1 grade in all weak groups  Baseline:  Goal  status: INITIAL  2.  Will complete TUG and 5XSTS in 12 seconds or less no device  Baseline:  Goal status: INITIAL  3.  Will ambulate at least 545ft in 3WMT no device no gait deviations  Baseline:  Goal status: INITIAL  4.  Will be able to perform simulated work duties in clinic with good mechanics and no increased pain  Baseline:  Goal status: INITIAL  5.  PSFS to have improved by at least 3 points  Baseline:  Goal status: INITIAL     PLAN:  PT FREQUENCY: 2x/week  PT DURATION: 8 weeks  PLANNED  INTERVENTIONS: 97750- Physical Performance Testing, 97110-Therapeutic exercises, 97530- Therapeutic activity, [id number]- Neuromuscular re-education, 97535- Self Care, 16109- Manual therapy, [id number]- Gait training, [phone]- Aquatic Therapy, 97016- Vasopneumatic device, and DME instructions  PLAN FOR NEXT SESSION: Rt hip mobility, progressive strengthening.   Bonna Bustard, PT, DPT, OCS, ATC 05/14/24  10:12 AM

## 2024-05-17 ENCOUNTER — Ambulatory Visit (INDEPENDENT_AMBULATORY_CARE_PROVIDER_SITE_OTHER): Admitting: Rehabilitative and Restorative Service Providers"

## 2024-05-17 ENCOUNTER — Encounter: Payer: Self-pay | Admitting: Rehabilitative and Restorative Service Providers"

## 2024-05-17 DIAGNOSIS — M25551 Pain in right hip: Secondary | ICD-10-CM | POA: Diagnosis not present

## 2024-05-17 DIAGNOSIS — M6281 Muscle weakness (generalized): Secondary | ICD-10-CM | POA: Diagnosis not present

## 2024-05-17 DIAGNOSIS — R2681 Unsteadiness on feet: Secondary | ICD-10-CM

## 2024-05-17 DIAGNOSIS — R262 Difficulty in walking, not elsewhere classified: Secondary | ICD-10-CM | POA: Diagnosis not present

## 2024-05-17 NOTE — Therapy (Signed)
 OUTPATIENT PHYSICAL THERAPY TREATMENT   Patient Name: Robert Cruz MRN: [id number] DOB:May 04, 1976, 48 y.o., male Today's Date: 05/17/2024  END OF SESSION:  PT End of Session - 05/17/24 0805     Visit Number 3    Number of Visits 17    Date for PT Re-Evaluation 07/05/24    Authorization Type [employer]    Authorization Time Period 05/10/24 to 07/05/24    PT Start Time 0805    PT Stop Time 0845    PT Time Calculation (min) 40 min    Activity Tolerance Patient tolerated treatment well    Behavior During Therapy [facility] for tasks assessed/performed            Past Medical History:  Diagnosis Date   Arthritis    gout and osteoarthritis   Chicken pox    Chronic kidney disease    GERD (gastroesophageal reflux disease)    Gout 2022   Hypertension    Morbid obesity (HCC)    Pre-diabetes    Sleep apnea    uses CPAP   Past Surgical History:  Procedure Laterality Date   COLONOSCOPY W/ BIOPSIES     ESOPHAGOGASTRODUODENOSCOPY     x 3   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   Procedure: ARTHROPLASTY, HIP, TOTAL, ANTERIOR APPROACH;  Surgeon: Arnie Lao, MD;  Location: [facility];  Service: Orthopedics;  Laterality: Right;   Patient Active Problem List   Diagnosis Date Noted   Status post total replacement of right hip 04/25/2024   Unilateral primary osteoarthritis, right hip 04/01/2024   URI, acute 08/17/2023   Obesity (BMI 30-39.9) 08/04/2023   Type 2 diabetes mellitus with chronic kidney disease, without long-term current use of insulin (HCC) 08/04/2023   Traumatic complete tear of left rotator cuff 04/12/2023   Traumatic complete tear of right rotator cuff 04/12/2023   Lower respiratory infection 10/04/2022   Right Achilles tendinitis 06/03/2022   Chronic gout 03/30/2022   Diarrhea 12/16/2021   Stage 3a chronic kidney disease (HCC) 09/01/2021   Left elbow pain 10/18/2019   Acute cough 10/03/2019   Mixed hyperlipidemia 09/08/2017   Allergic rhinitis 10/11/2016    Chronic migraine without aura without status migrainosus, not intractable 01/14/2016   Resistant hypertension 01/04/2016   Generalized headache 12/16/2015   Colon cancer screening 09/06/2013   GERD (gastroesophageal reflux disease) 09/06/2013   Dysphagia 02/08/2011   OSA (obstructive sleep apnea) 12/28/2008   Severe obesity (BMI >= 40) (HCC) 10/07/2008    PCP: Alyson Back NP-C   REFERRING PROVIDER: Arnie Lao, MD  REFERRING DIAG: Diagnosis [phone] (ICD-10-CM) - Status post right hip replacement  THERAPY DIAG:  Pain in right hip  Muscle weakness (generalized)  Difficulty in walking, not elsewhere classified  Unsteadiness on feet  Rationale for Evaluation and Treatment: Rehabilitation  ONSET DATE: R anterior THA surgery 04/25/24  SUBJECTIVE:   SUBJECTIVE STATEMENT: Robert Cruz reports better sleep over the past week.  Up stairs and activities at the end of the day are difficult.  He is almost 100% off the cane.  PERTINENT HISTORY: See above   PAIN:  NPRS scale: 0-3/10 this week Pain location: surgical hip Pain description: general soreness  Aggravating factors: doing too much  Relieving factors: rest and ice   PRECAUTIONS: Anterior hip  RED FLAGS: None   WEIGHT BEARING RESTRICTIONS: No  FALLS:  Has patient fallen in last 6 months? 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    PATIENT SURVEYS:   Patient-Specific Activity Scoring Scheme  0 represents "unable to perform." 10  represents "able to perform at prior level. 0 1 2 3 4 5 6 7 8 9  10 (Date and Score)   Activity Eval  05/10/2024    1. Tying shoes  1     2. Putting on socks  1     3. Walking up and down steps  4   4.    5.    Score 2    Total score = sum of the activity scores/number of activities Minimum detectable change (90%CI) for average score = 2 points Minimum detectable change (90%CI) for single activity score = 3 points  COGNITION: 05/10/2024 Overall cognitive status: Within functional limits for tasks assessed     SENSATION: 05/10/2024 Some numbness at incision and where swelling is   EDEMA:  05/10/2024 As expected post-op   LOWER EXTREMITY ROM:      Right 05/14/2024 Left 05/14/2024  Hip flexion 95 PROM in supine    Hip extension    Hip abduction    Hip adduction    Hip internal rotation    Hip external rotation 30 PROM in supine 70 deg hip flexion    Knee flexion 26 PROM in supine 70 deg hip flexion   Knee extension    Ankle dorsiflexion    Ankle plantarflexion    Ankle inversion    Ankle eversion     (Blank rows = not tested)   LOWER EXTREMITY MMT:  MMT Right Eval 05/10/2024 Left Eval 05/10/2024  Hip flexion 3 pain limited  5  Hip extension    Hip abduction 3 slight pain 4  Hip adduction    Hip internal rotation    Hip external rotation    Knee flexion 4+ 4+  Knee extension 4 5  Ankle dorsiflexion    Ankle plantarflexion    Ankle inversion    Ankle eversion     (Blank rows = not tested)    FUNCTIONAL TESTS:  05/10/2024 5 times sit to stand: 25.5 seconds offloads surgical LE Timed up and go (TUG): 23.5 seconds hurrycane  3 minute walk test: 340ft carried hurrycane but didn't use it   GAIT: 05/10/2024 Distance walked: 355ft Assistive device utilized: carried cane but did not use it  Level of assistance: Modified independence Comments: antalgic gait pattern, reduced gait speed  TREATMENT         DATE:  05/17/2024 NuStep Level 6 for 5 minutes Single knee to chest with other leg straight 5 x 20 seconds Figure 4 stretch with push 5 x 20 seconds Yoga Bridge 10 x 5 seconds Side lie clams Black Thera-Band 15 x 3 seconds, slow eccentrics  Functional Activities: Hip hike at counter top and in door frame 10 x 5-10 seconds in each position  Neuromuscular re-education: Tandem balance eyes open; head turning and eyes closed 2 x 20 seconds    05/14/2024 Therex: Supine SKC 15 sec x 3 Rt leg  Supine figure 4 push away 15 sec x 3 Rt leg Supine bridge 2 x 10 2-3 sec hold  Sidelying Rt hip clam shell 2 x 15 Seated Rt leg quad set with SLR x 10 slowly   Nustep Lvl 5 for ROM 8 mins UE/LE   Review of handout for HEP.   Neuro Re-ed (muscle activation, balance control improvements) Tandem stance 1 min x 1 bilateral in corner Seated Rt leg quad set 5 sec hold x 10  Cues for techniques and cues for HEP use.    TREATMENT         DATE: 05/10/24  Eval, POC, discussed general progression and expectations for recovery following anterior THA, continue with hospital HEP   Bridges x12 Supine hip flexion to tolerance R LE x12 STS from high mat table cues to not shift off R LE x10    PATIENT EDUCATION:  05/10/2024 Education details:  HEP update Person educated: Patient Education method: Explanation, Demonstration, and Handouts Education comprehension: verbalized understanding, returned demonstration, and verbal cues required  HOME EXERCISE PROGRAM: Access Code: [email or web address] URL: [email or web address] Date: 05/17/2024 Prepared by: Terral Ferrari  Exercises - Supine Single Knee to Chest Stretch  - 2-3 x daily - 7 x weekly - 1 sets - 5 reps - 20 seconds hold - Supine Figure 4 Piriformis Stretch  - 2-3 x daily - 7 x weekly - 1 sets - 5 reps - 20 seconds hold - Supine Bridge  - 1-2 x daily - 7 x weekly -  2 sets - 10 reps - 5 seconds hold - Clamshell (Mirrored)  - 1-2 x daily - 7 x weekly - 2 sets - 10-15 reps - 3 seconds hold - Seated Quad Set  - 3-5 x daily - 7 x weekly - 1 sets - 10 reps - 5 hold - Tandem Stance in Corner  - 1-2 x daily - 7 x weekly - 1 sets - 5 reps - 20-30 seconds hold - Standing Hip Hiking  - 2-3 x daily - 7 x weekly - 2 sets - 10 reps - 5-10 seconds hold  ASSESSMENT:  CLINICAL IMPRESSION: Robert Cruz is doing a good job with his current home exercise program which is addressing impairments noted at evaluation.  We did make some minor progressions to hip abductor strength along with encouraging continued compliance with his current home exercises which should allow him to do things like don socks and shoes, cut toenails and be able to function more normally.  Robert Cruz is on track to meet long-term goals within the established plan of care.  OBJECTIVE IMPAIRMENTS: Abnormal gait, decreased activity tolerance, decreased balance, decreased knowledge of use of DME, decreased mobility, difficulty walking, decreased ROM, decreased strength, impaired flexibility, impaired sensation, obesity, and pain.   ACTIVITY LIMITATIONS: carrying, lifting, sitting, standing, squatting, sleeping, stairs, transfers, and locomotion level  PARTICIPATION LIMITATIONS: driving, shopping, community activity,  occupation, and yard work  PERSONAL FACTORS: Age, Fitness, Past/current experiences, and Time since onset of injury/illness/exacerbation are also affecting patient's functional outcome.   REHAB POTENTIAL: Excellent  CLINICAL DECISION MAKING: Stable/uncomplicated  EVALUATION COMPLEXITY: Low   GOALS: Goals reviewed with patient? Yes  SHORT TERM GOALS: Target date: 06/07/2024   Will be compliant with appropriate progressive HEP  Baseline: Goal status: Met 05/17/2024  2.  No pain with car transfers or bed mobility  Baseline:  Goal status: on going 05/17/2024  3.  Gait deviations to have normalized   Baseline:  Goal status: on going 05/17/2024  4.  Will complete 5xSTS test in 17 seconds or less without off-shifting from surgical LE  Baseline:  Goal status: on going 05/14/2024    LONG TERM GOALS: Target date: 07/05/2024    MMT to have improved by at least 1 grade in all weak groups  Baseline:  Goal status: INITIAL  2.  Will complete TUG and 5XSTS in 12 seconds or less no device  Baseline:  Goal status: INITIAL  3.  Will ambulate at least 565ft in 3WMT no device no gait deviations  Baseline:  Goal status: INITIAL  4.  Will be able to perform simulated work duties in clinic with good mechanics and no increased pain  Baseline:  Goal status: INITIAL  5.  PSFS to have improved by at least 3 points  Baseline:  Goal status: INITIAL     PLAN:  PT FREQUENCY: 2x/week  PT DURATION: 8 weeks  PLANNED INTERVENTIONS: 97750- Physical Performance Testing, 97110-Therapeutic exercises, 97530- Therapeutic activity, [id number]- Neuromuscular re-education, 97535- Self Care, 11914- Manual therapy, [id number]- Gait training, [phone]- Aquatic Therapy, 97016- Vasopneumatic device, and DME instructions  PLAN FOR NEXT SESSION: Rt hip mobility, progressive strengthening (hip abductors emphasis).   Joli Neas PT, MPT 05/17/24  12:34 PM

## 2024-05-20 ENCOUNTER — Telehealth: Payer: Self-pay | Admitting: *Deleted

## 2024-05-20 NOTE — Telephone Encounter (Signed)
 Pt called wanting to know if he can be seen earlier due to fluid on his rt hip. He has appt. On 05-23-24 @ 1:30. He says it is very painful and would appreciate if he can be seen earlier he wants a call back @ [phone]

## 2024-05-21 NOTE — Telephone Encounter (Signed)
 done

## 2024-05-22 ENCOUNTER — Encounter: Admitting: Physician Assistant

## 2024-05-22 ENCOUNTER — Ambulatory Visit (INDEPENDENT_AMBULATORY_CARE_PROVIDER_SITE_OTHER): Admitting: Physical Therapy

## 2024-05-22 ENCOUNTER — Encounter: Payer: Self-pay | Admitting: Physical Therapy

## 2024-05-22 ENCOUNTER — Encounter: Payer: Self-pay | Admitting: Radiology

## 2024-05-22 ENCOUNTER — Ambulatory Visit (INDEPENDENT_AMBULATORY_CARE_PROVIDER_SITE_OTHER): Admitting: Physician Assistant

## 2024-05-22 ENCOUNTER — Encounter: Payer: Self-pay | Admitting: Physician Assistant

## 2024-05-22 DIAGNOSIS — R2681 Unsteadiness on feet: Secondary | ICD-10-CM

## 2024-05-22 DIAGNOSIS — M6281 Muscle weakness (generalized): Secondary | ICD-10-CM

## 2024-05-22 DIAGNOSIS — R262 Difficulty in walking, not elsewhere classified: Secondary | ICD-10-CM

## 2024-05-22 DIAGNOSIS — M25551 Pain in right hip: Secondary | ICD-10-CM

## 2024-05-22 DIAGNOSIS — Z96641 Presence of right artificial hip joint: Secondary | ICD-10-CM

## 2024-05-22 NOTE — Therapy (Signed)
 OUTPATIENT PHYSICAL THERAPY TREATMENT   Patient Name: Robert Cruz MRN: [id number] DOB:March 26, 1976, 48 y.o., male Today's Date: 05/22/2024  END OF SESSION:  PT End of Session - 05/22/24 0806     Visit Number 4    Number of Visits 17    Date for PT Re-Evaluation 07/05/24    Authorization Type [employer]    Authorization Time Period 05/10/24 to 07/05/24    PT Start Time 0803    PT Stop Time 0845    PT Time Calculation (min) 42 min    Activity Tolerance Patient tolerated treatment well    Behavior During Therapy [facility] for tasks assessed/performed             Past Medical History:  Diagnosis Date   Arthritis    gout and osteoarthritis   Chicken pox    Chronic kidney disease    GERD (gastroesophageal reflux disease)    Gout 2022   Hypertension    Morbid obesity (HCC)    Pre-diabetes    Sleep apnea    uses CPAP   Past Surgical History:  Procedure Laterality Date   COLONOSCOPY W/ BIOPSIES     ESOPHAGOGASTRODUODENOSCOPY     x 3   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   Procedure: ARTHROPLASTY, HIP, TOTAL, ANTERIOR APPROACH;  Surgeon: Arnie Lao, MD;  Location: [facility];  Service: Orthopedics;  Laterality: Right;   Patient Active Problem List   Diagnosis Date Noted   Status post total replacement of right hip 04/25/2024   Unilateral primary osteoarthritis, right hip 04/01/2024   URI, acute 08/17/2023   Obesity (BMI 30-39.9) 08/04/2023   Type 2 diabetes mellitus with chronic kidney disease, without long-term current use of insulin (HCC) 08/04/2023   Traumatic complete tear of left rotator cuff 04/12/2023   Traumatic complete tear of right rotator cuff 04/12/2023   Lower respiratory infection 10/04/2022   Right Achilles tendinitis 06/03/2022   Chronic gout 03/30/2022   Diarrhea 12/16/2021   Stage 3a chronic kidney disease (HCC) 09/01/2021   Left elbow pain 10/18/2019   Acute cough 10/03/2019   Mixed hyperlipidemia 09/08/2017   Allergic rhinitis  10/11/2016   Chronic migraine without aura without status migrainosus, not intractable 01/14/2016   Resistant hypertension 01/04/2016   Generalized headache 12/16/2015   Colon cancer screening 09/06/2013   GERD (gastroesophageal reflux disease) 09/06/2013   Dysphagia 02/08/2011   OSA (obstructive sleep apnea) 12/28/2008   Severe obesity (BMI >= 40) (HCC) 10/07/2008    PCP: Alyson Back NP-C   REFERRING PROVIDER: Arnie Lao, MD  REFERRING DIAG: Diagnosis [id number] (ICD-10-CM) - Status post right hip replacement  THERAPY DIAG:  Pain in right hip  Muscle weakness (generalized)  Difficulty in walking, not elsewhere classified  Unsteadiness on feet  Rationale for Evaluation and Treatment: Rehabilitation  ONSET DATE: R anterior THA surgery 04/25/24  SUBJECTIVE:   SUBJECTIVE STATEMENT: His exercises are going well.  Walking without cane with some soreness if does too much.  He has fluid on hip that he is being removed today.   PERTINENT HISTORY: See above   PAIN:  NPRS scale:  rest up to 3/10 (from fluid) and with activities  0-3/10 this week Pain location: right surgical hip Pain description: general soreness  Aggravating factors: doing too much  Relieving factors: rest and ice   PRECAUTIONS: Anterior hip  RED FLAGS: None   WEIGHT BEARING RESTRICTIONS: No  FALLS:  Has patient fallen in last 6 months? 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    PATIENT SURVEYS:   Patient-Specific  Activity Scoring Scheme  0 represents "unable to perform." 10 represents "able to perform at prior level. 0 1 2 3 4 5 6 7 8 9  10 (Date and Score)   Activity Eval  05/10/2024 05/22/24   1. Tying shoes  1  7   2. Putting on socks  1   7  3. Walking up and down steps  4 7  4. Work tasks  3  5.    Score 2 6   Total score = sum of the activity scores/number of activities Minimum detectable change (90%CI) for average score = 2 points Minimum detectable change (90%CI) for single activity score = 3 points  COGNITION: 05/10/2024 Overall cognitive status: Within functional limits for tasks assessed     SENSATION: 05/10/2024 Some numbness at incision and where swelling is   EDEMA:  05/10/2024 As expected post-op   LOWER EXTREMITY ROM:      Right 05/14/2024 Left 05/14/2024  Hip flexion 95 PROM in supine    Hip extension    Hip abduction    Hip adduction    Hip internal rotation    Hip external rotation 30 PROM in supine 70 deg hip flexion    Knee flexion 26 PROM in supine 70 deg hip flexion   Knee extension    Ankle dorsiflexion    Ankle plantarflexion    Ankle inversion    Ankle eversion     (Blank rows = not tested)   LOWER EXTREMITY MMT:  MMT Right Eval 05/10/2024 Left Eval 05/10/2024  Hip flexion 3 pain limited  5  Hip extension    Hip abduction 3 slight pain 4  Hip adduction    Hip internal rotation    Hip external rotation    Knee flexion 4+ 4+  Knee extension 4 5  Ankle dorsiflexion    Ankle plantarflexion    Ankle inversion    Ankle eversion     (Blank rows = not tested)    FUNCTIONAL TESTS:  05/10/2024 5 times sit to stand: 25.5 seconds offloads surgical LE Timed up and go (TUG): 23.5 seconds hurrycane  3 minute walk test: 338ft carried hurrycane but didn't use it   GAIT: 05/10/2024 Distance walked: 368ft Assistive device utilized: carried cane but did not use it  Level of assistance: Modified independence Comments: antalgic gait pattern, reduced  gait speed  TREATMENT         DATE:  05/22/2024 Therapeutic Exercise: NuStep seat 14 BLEs only Level 6 for 8 minutes  Therapeutic Activities: Stepping over hurdle alternating LEs with stance press upright thru BLEs 5 reps 6 hurdle & 5 reps 9 hurdle without UE support. Anterior/posterior and laterally  Clam shell with PT correcting form to isolate Gluteus Medius better.  Shoulders, pelvis and feet against a wall (can do on couch at home) 3 reps without resistance, 10 reps with black T-band resistance. 1st set with feet together.  2nd set with right foot 6 higher on wall with no resistance for 10 reps. Pt verbalized better understanding.   Bridge with concentric / eccentric BLEs but stabilization in up motion by sliding single LE outward leg press.  Bridge with Black T-band resistance abduction 10 reps.  PT tactile & verbal cues on update to exercise.  Standing in doorway with PT cues on positioning so that he does not have a lateral trunk lean.  10 reps hip hiking only, then progressed second set of 10 reps to hip hiking and moving left lower extremity forward to height that he can still control right side pelvis from dropping.  Patient verbalized understanding of this as an HEP progression. PT updated HEP with HO advancing intensity of exercises.  Patient verbalized understanding after performing in clinic.     TREATMENT         DATE:  05/17/2024 NuStep Level 6 for 5 minutes Single knee to chest with other leg straight 5 x 20 seconds Figure 4 stretch with push 5 x 20 seconds Yoga Bridge 10 x 5 seconds Side lie clams Black Thera-Band 15 x 3 seconds, slow eccentrics  Functional Activities: Hip hike at counter top and in door frame 10 x 5-10 seconds in each position  Neuromuscular re-education: Tandem balance eyes open; head turning and eyes closed  2 x 20 seconds    05/14/2024 Therex: Supine SKC 15 sec x 3 Rt leg  Supine figure 4 push away 15 sec x 3 Rt leg Supine bridge 2 x 10 2-3 sec hold  Sidelying Rt hip clam shell 2 x 15 Seated Rt leg quad set with SLR x 10 slowly   Nustep Lvl 5 for ROM 8 mins UE/LE   Review of handout for HEP.   Neuro Re-ed (muscle activation, balance control improvements) Tandem stance 1 min x 1 bilateral in corner Seated Rt leg quad set 5 sec hold x 10  Cues for techniques and cues for HEP use.      PATIENT EDUCATION:  05/10/2024 Education details:  HEP update Person educated: Patient Education method: Explanation, Demonstration, and Handouts Education comprehension: verbalized understanding, returned demonstration, and verbal cues required  HOME EXERCISE PROGRAM: Access Code: [email or web address] URL: [email or web address] Date: 05/22/2024 Prepared by: Lorie Rook  Exercises - Supine Single Knee to Chest Stretch  - 2-3 x daily - 7 x weekly - 1 sets - 5 reps - 20 seconds hold - Supine Figure 4 Piriformis Stretch  - 2-3 x daily - 7 x weekly - 1 sets - 5 reps - 20 seconds hold - Supine Bridge  - 1-2 x daily - 7 x weekly - 2 sets - 10 reps - 5 seconds hold - Supine Bridge with Resistance Band  - 1 x daily - 7 x weekly - 2 sets - 10 reps - 5 seconds hold - Supine Bridge with Leg Extension  - 1 x daily - 7 x weekly - 2  sets - 10 reps - 5 seconds hold - Clamshell (Mirrored)  - 1-2 x daily - 7 x weekly - 2 sets - 10-15 reps - 3 seconds hold - Seated Quad Set  - 3-5 x daily - 7 x weekly - 1 sets - 10 reps - 5 hold - Tandem Stance in Corner  - 1-2 x daily - 7 x weekly - 1 sets - 5 reps - 20-30 seconds hold - Standing Hip Hiking  - 2-3 x daily - 7 x weekly - 2 sets - 10 reps - 5-10 seconds hold - standing hip hike with straight leg forward kick  - 1 x daily - 7 x weekly - 2 sets - 10 reps - 5 seconds hold  ASSESSMENT:  CLINICAL IMPRESSION: PT reviewed HEP with corrections to has technique and  progressed intensity as tolerated.  Patient appears to understand HEP better.  OBJECTIVE IMPAIRMENTS: Abnormal gait, decreased activity tolerance, decreased balance, decreased knowledge of use of DME, decreased mobility, difficulty walking, decreased ROM, decreased strength, impaired flexibility, impaired sensation, obesity, and pain.   ACTIVITY LIMITATIONS: carrying, lifting, sitting, standing, squatting, sleeping, stairs, transfers, and locomotion level  PARTICIPATION LIMITATIONS: driving, shopping, community activity, occupation, and yard work  PERSONAL FACTORS: Age, Fitness, Past/current experiences, and Time since onset of injury/illness/exacerbation are also affecting patient's functional outcome.   REHAB POTENTIAL: Excellent  CLINICAL DECISION MAKING: Stable/uncomplicated  EVALUATION COMPLEXITY: Low   GOALS: Goals reviewed with patient? Yes  SHORT TERM GOALS: Target date: 06/07/2024   Will be compliant with appropriate progressive HEP  Baseline: Goal status: Met 05/22/2024 for HEP to date  2.  No pain with car transfers or bed mobility  Baseline:  Goal status: Ongoing  05/22/2024  3.  Gait deviations to have normalized  Baseline:  Goal status: Ongoing  05/22/2024  4.  Will complete 5xSTS test in 17 seconds or less without off-shifting from surgical LE  Baseline:  Goal status: Ongoing  05/22/2024    LONG TERM GOALS: Target date: 07/05/2024    MMT to have improved by at least 1 grade in all weak groups  Baseline:  Goal status: Ongoing  05/22/2024  2.  Will complete TUG and 5XSTS in 12 seconds or less no device  Baseline:  Goal status: Ongoing  05/22/2024  3.  Will ambulate at least 522ft in 3WMT no device no gait deviations  Baseline:  Goal status:   Ongoing  05/22/2024  4.  Will be able to perform simulated work duties in clinic with good mechanics and no increased pain  Baseline:  Goal status: Ongoing  05/22/2024  5.  PSFS to have improved by at least 3 points   Baseline:  Goal status: Ongoing  05/22/2024     PLAN:  PT FREQUENCY: 2x/week  PT DURATION: 8 weeks  PLANNED INTERVENTIONS: 97750- Physical Performance Testing, 97110-Therapeutic exercises, 97530- Therapeutic activity, [id number]- Neuromuscular re-education, 97535- Self Care, 16109- Manual therapy, [id number]- Gait training, [phone]- Aquatic Therapy, 97016- Vasopneumatic device, and DME instructions  PLAN FOR NEXT SESSION: Continue with progressive strengthening exercises for his hip.  Rt hip mobility, work towards [employer].   Cambridge Deleo, PT, DPT 05/22/2024, 4:13 PM

## 2024-05-22 NOTE — Progress Notes (Signed)
 HPI: [name] returns today status post right total hip arthroplasty 04/23/2024.  Here for wound check possible reaspiration.  He states he is really having no pain some discomfort due to the fluid accumulation.  He is using no assistive devices to ambulate.  He is working on range of motion strengthening the hip.  Review of systems: Denies fevers chills.  Physical exam: General Well-developed well-nourished male no acute distress. Right hip surgical incisions healing well no signs of wound dehiscence or infection.  Positive seroma.  70 cc of serosanguineous fluid was aspirated today patient tolerates well.  Right calf supple nontender.  Impression: Status post right total hip arthroplasty  Plan: Will see him back in 1 month to see how he is doing overall.  His anticipated return to work date is July 15, 2024.  Questions were encouraged and answered at length.

## 2024-05-23 ENCOUNTER — Encounter: Admitting: Physician Assistant

## 2024-05-28 ENCOUNTER — Ambulatory Visit (INDEPENDENT_AMBULATORY_CARE_PROVIDER_SITE_OTHER): Admitting: Physical Therapy

## 2024-05-28 ENCOUNTER — Encounter: Payer: Self-pay | Admitting: Physical Therapy

## 2024-05-28 DIAGNOSIS — M25551 Pain in right hip: Secondary | ICD-10-CM

## 2024-05-28 DIAGNOSIS — M6281 Muscle weakness (generalized): Secondary | ICD-10-CM

## 2024-05-28 DIAGNOSIS — R262 Difficulty in walking, not elsewhere classified: Secondary | ICD-10-CM | POA: Diagnosis not present

## 2024-05-28 DIAGNOSIS — R2681 Unsteadiness on feet: Secondary | ICD-10-CM

## 2024-05-28 NOTE — Therapy (Signed)
 OUTPATIENT PHYSICAL THERAPY TREATMENT   Patient Name: Robert Cruz MRN: [id number] DOB:1976-10-28, 48 y.o., male Today's Date: 05/28/2024  END OF SESSION:  PT End of Session - 05/28/24 0848     Visit Number 5    Number of Visits 17    Date for PT Re-Evaluation 07/05/24    Authorization Type [employer]    Authorization Time Period 05/10/24 to 07/05/24    PT Start Time 0845    PT Stop Time 0930    PT Time Calculation (min) 45 min    Activity Tolerance Patient tolerated treatment well    Behavior During Therapy [facility] [place] for tasks assessed/performed              Past Medical History:  Diagnosis Date   Arthritis    gout and osteoarthritis   Chicken pox    Chronic kidney disease    GERD (gastroesophageal reflux disease)    Gout 2022   Hypertension    Morbid obesity (HCC)    Pre-diabetes    Sleep apnea    uses CPAP   Past Surgical History:  Procedure Laterality Date   COLONOSCOPY W/ BIOPSIES     ESOPHAGOGASTRODUODENOSCOPY     x 3   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   Procedure: ARTHROPLASTY, HIP, TOTAL, ANTERIOR APPROACH;  Surgeon: Vernetta Lonni GRADE, MD;  Location: [facility];  Service: Orthopedics;  Laterality: Right;   Patient Active Problem List   Diagnosis Date Noted   Status post total replacement of right hip 04/25/2024   Unilateral primary osteoarthritis, right hip 04/01/2024   URI, acute 08/17/2023   Obesity (BMI 30-39.9) 08/04/2023   Type 2 diabetes mellitus with chronic kidney disease, without long-term current use of insulin (HCC) 08/04/2023   Traumatic complete tear of left rotator cuff 04/12/2023   Traumatic complete tear of right rotator cuff 04/12/2023   Lower respiratory infection 10/04/2022   Right Achilles tendinitis 06/03/2022   Chronic gout 03/30/2022   Diarrhea 12/16/2021   Stage 3a chronic kidney disease (HCC) 09/01/2021   Left elbow pain 10/18/2019   Acute cough 10/03/2019   Mixed hyperlipidemia 09/08/2017   Allergic rhinitis  10/11/2016   Chronic migraine without aura without status migrainosus, not intractable 01/14/2016   Resistant hypertension 01/04/2016   Generalized headache 12/16/2015   Colon cancer screening 09/06/2013   GERD (gastroesophageal reflux disease) 09/06/2013   Dysphagia 02/08/2011   OSA (obstructive sleep apnea) 12/28/2008   Severe obesity (BMI >= 40) (HCC) 10/07/2008    PCP: Lendia Nordmann NP-C   REFERRING PROVIDER: Vernetta Lonni GRADE, MD  REFERRING DIAG: Diagnosis [phone] (ICD-10-CM) - Status post right hip replacement  THERAPY DIAG:  Pain in right hip  Muscle weakness (generalized)  Difficulty in walking, not elsewhere classified  Unsteadiness on feet  Rationale for Evaluation and Treatment: Rehabilitation  ONSET DATE: R anterior THA surgery 04/25/24  SUBJECTIVE:   SUBJECTIVE STATEMENT: The exercises progression has gone well.  He had fluid removed on 05/22/24 for 2nd time which helped his hip pain.  He used self-propelled push mower for 30 min and weed-eating / blower 20 min more.  He had some fatigue but no significant pain increase.    PERTINENT HISTORY: See above   PAIN:  NPRS scale:  rest up to 1/10  and with activities  0-1/10 this week Pain location: right surgical hip Pain description: general soreness  Aggravating factors: doing too much  Relieving factors: rest and ice   PRECAUTIONS: Anterior hip  RED FLAGS: None  WEIGHT BEARING RESTRICTIONS: No  FALLS:  Has patient fallen in last 6 months? 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    PATIENT SURVEYS:   Patient-Specific Activity Scoring Scheme  0 represents "unable to perform." 10 represents "able to perform at prior level. 0 1 2 3 4 5 6 7 8 9  10 (Date and Score)   Activity Eval  05/10/2024 05/22/24   1. Tying shoes  1  7   2. Putting on socks  1   7  3. Walking up and down steps  4 7  4. Work tasks  3  5.    Score 2 6   Total score = sum of the activity scores/number of activities Minimum detectable change (90%CI) for average score = 2 points Minimum detectable change (90%CI) for single activity score = 3 points  COGNITION: 05/10/2024 Overall cognitive status: Within functional limits for tasks assessed     SENSATION: 05/10/2024 Some numbness at incision and where swelling is   EDEMA:  05/10/2024 As expected post-op   LOWER EXTREMITY ROM:      Right 05/14/2024  Hip flexion 95 PROM in supine   Hip extension   Hip abduction   Hip adduction   Hip internal rotation   Hip external rotation 30 PROM in supine 70 deg hip flexion   Knee flexion 26 PROM in supine 70 deg hip flexion  Knee extension   Ankle dorsiflexion   Ankle plantarflexion   Ankle inversion   Ankle eversion    (Blank rows = not tested)   LOWER EXTREMITY MMT:  MMT Right Eval 05/10/2024 Left Eval 05/10/2024  Hip flexion 3 pain limited  5  Hip extension    Hip abduction 3 slight pain 4  Hip adduction    Hip internal rotation    Hip external rotation    Knee flexion 4+ 4+  Knee extension 4 5  Ankle dorsiflexion    Ankle plantarflexion    Ankle inversion    Ankle eversion     (Blank rows = not tested)    FUNCTIONAL TESTS:  05/10/2024 5 times sit to stand: 25.5 seconds offloads surgical LE Timed up and go (TUG): 23.5 seconds hurrycane  3 minute walk test: 331ft carried hurrycane but didn't use it   GAIT: 05/10/2024 Distance walked: 335ft Assistive device utilized: carried cane but did not use it  Level of assistance:  Modified independence Comments: antalgic gait pattern, reduced gait speed  TREATMENT         DATE:  05/28/2024 Therapeutic Exercise: NuStep seat 14 BLEs only Level 8 for 8 minutes Gastroc stretch step heel depression 30 sec hold 3 reps Hamstring stretch foot on 2nd step 30 sec hold 3 reps Quad stretch standing with towel to assist knee flexion 30 sec hold 3 reps  Therapeutic Activities: Standing in doorway with PT cues on positioning so that he does not have a lateral trunk lean. RLE abduction control with LLE hip hike. 10 reps hip hiking only, 2nd set of 10 reps to hip hiking and moving left lower extremity forward to height that he can still control right side pelvis from dropping. 3rd set of 10 reps to hip hiking and moving left lower extremity backward to height that he can still control right side pelvis from dropping. 4th set of 10 reps to hip hiking and moving left lower extremity abduction to height that he can still control right side pelvis from dropping. 5th set of 10 reps to hip hiking and moving left lower extremity adduction to height that he can still control right side pelvis from dropping.Patient verbalized understanding of this as an HEP progression. Stairs: step-to pattern 2 rails BUE support 11 steps PT demo & verbal cues on technique to use RLE to lower & raise body weight.  Pt performed with supervision / cues to correct but no instability noted. Pt appears safe to perform at home. Side step up with BUEs on rail using RLE including active to step RLE on/off step, weight shift over RLE for step-up & step-down for 10 reps.  Pt appears safe to perform at home.   Forward Plank at sink 30 sec hold 3 reps.   PT updated HEP with demo, verbal & HO cues.  Pt verbalized understanding after performing during session.    TREATMENT         DATE:   05/22/2024 Therapeutic Exercise: NuStep seat 14 BLEs only Level 6 for 8 minutes  Therapeutic Activities: Stepping over hurdle alternating LEs with stance press upright thru BLEs 5 reps 6 hurdle & 5 reps 9 hurdle without UE support. Anterior/posterior and laterally  Clam shell with PT correcting form to isolate Gluteus Medius better.  Shoulders, pelvis and feet against a wall (can do on couch at home) 3 reps without resistance, 10 reps with black T-band resistance. 1st set with feet together.  2nd set with right foot 6 higher on wall with no resistance for 10 reps. Pt verbalized better understanding.   Bridge with concentric / eccentric BLEs but stabilization in up motion by sliding single LE outward leg press.  Bridge with Black T-band resistance abduction 10 reps.  PT tactile & verbal cues on update to exercise.  Standing in doorway with PT cues on positioning so that he does not have a lateral trunk lean.  10 reps hip hiking only, then progressed second set of 10 reps to hip hiking and moving left lower extremity forward to height that he can still control right side pelvis from dropping.  Patient verbalized understanding of this as an HEP progression. PT updated HEP with HO advancing intensity of exercises.  Patient verbalized understanding after performing in clinic.     TREATMENT         DATE:  05/17/2024 NuStep Level 6 for 5 minutes Single knee to chest with other leg straight 5 x 20 seconds Figure 4 stretch with push 5 x 20 seconds Yoga Bridge 10 x 5 seconds  Side lie clams Black Thera-Band 15 x 3 seconds, slow eccentrics  Functional Activities: Hip hike at counter top and in door frame 10 x 5-10 seconds in each position  Neuromuscular re-education: Tandem balance eyes open; head turning and eyes closed 2 x 20 seconds     PATIENT EDUCATION:  05/10/2024 Education details:  HEP update Person educated: Patient Education method: Programmer, multimedia, Demonstration, and  Handouts Education comprehension: verbalized understanding, returned demonstration, and verbal cues required  HOME EXERCISE PROGRAM: Access Code: [email or web address] URL: [email or web address] Date: 05/28/2024 Prepared by: Grayce Spatz  Exercises - Supine Single Knee to Chest Stretch  - 2-3 x daily - 7 x weekly - 1 sets - 5 reps - 20 seconds hold - Supine Figure 4 Piriformis Stretch  - 2-3 x daily - 7 x weekly - 1 sets - 5 reps - 20 seconds hold - Supine Bridge  - 1-2 x daily - 7 x weekly - 2 sets - 10 reps - 5 seconds hold - Supine Bridge with Resistance Band  - 1 x daily - 7 x weekly - 2 sets - 10 reps - 5 seconds hold - Supine Bridge with Leg Extension  - 1 x daily - 7 x weekly - 2 sets - 10 reps - 5 seconds hold - Clamshell (Mirrored)  - 1-2 x daily - 7 x weekly - 2 sets - 10-15 reps - 3 seconds hold - Seated Quad Set  - 3-5 x daily - 7 x weekly - 1 sets - 10 reps - 5 hold - Tandem Stance in Corner  - 1-2 x daily - 7 x weekly - 1 sets - 5 reps - 20-30 seconds hold - Standing Hip Hiking  - 2-3 x daily - 7 x weekly - 2 sets - 10 reps - 5-10 seconds hold - standing hip hike with leg kick 4 directions  - 1 x daily - 7 x weekly - 2 sets - 10 reps - 5 seconds hold - Gastroc Stretch on Step  - 1-2 x daily - 7 x weekly - 1 sets - 3 reps - 20-30 seconds hold - Standing Hamstring Stretch with Step  - 1-2 x daily - 7 x weekly - 1 sets - 3 reps - 20-30 seconds hold - Standing Quad Stretch with Towel and Arm Support  - 1-2 x daily - 7 x weekly - 1 sets - 3 reps - 20-30 seconds hold - Standing Plank on Wall  - 1 x daily - 7 x weekly - 1 sets - 3 reps - 30 seconds hold  ASSESSMENT:  CLINICAL IMPRESSION: PT progressed HEP including using RLE on stairs.   Patient appears to understand HEP better.  OBJECTIVE IMPAIRMENTS: Abnormal gait, decreased activity tolerance, decreased balance, decreased knowledge of use of DME, decreased mobility, difficulty walking, decreased ROM, decreased strength,  impaired flexibility, impaired sensation, obesity, and pain.   ACTIVITY LIMITATIONS: carrying, lifting, sitting, standing, squatting, sleeping, stairs, transfers, and locomotion level  PARTICIPATION LIMITATIONS: driving, shopping, community activity, occupation, and yard work  PERSONAL FACTORS: Age, Fitness, Past/current experiences, and Time since onset of injury/illness/exacerbation are also affecting patient's functional outcome.   REHAB POTENTIAL: Excellent  CLINICAL DECISION MAKING: Stable/uncomplicated  EVALUATION COMPLEXITY: Low   GOALS: Goals reviewed with patient? Yes  SHORT TERM GOALS: Target date: 06/07/2024   Will be compliant with appropriate progressive HEP  Baseline: Goal status: Met 05/22/2024 for HEP to date  2.  No pain with car transfers or bed mobility  Baseline:  Goal  status: Ongoing   05/28/2024  3.  Gait deviations to have normalized  Baseline:  Goal status: Ongoing   05/28/2024  4.  Will complete 5xSTS test in 17 seconds or less without off-shifting from surgical LE  Baseline:  Goal status: Ongoing   05/28/2024    LONG TERM GOALS: Target date: 07/05/2024  MMT to have improved by at least 1 grade in all weak groups  Baseline:  Goal status: Ongoing   05/28/2024  2.  Will complete TUG and 5XSTS in 12 seconds or less no device  Baseline:  Goal status: Ongoing   05/28/2024  3.  Will ambulate at least 57ft in 3WMT no device no gait deviations  Baseline:  Goal status:   Ongoing   05/28/2024  4.  Will be able to perform simulated work duties in clinic with good mechanics and no increased pain  Baseline:  Goal status: Ongoing  05/28/2024  5.  PSFS to have improved by at least 3 points  Baseline:  Goal status: Ongoing  05/28/2024     PLAN:  PT FREQUENCY: 2x/week  PT DURATION: 8 weeks  PLANNED INTERVENTIONS: 97750- Physical Performance Testing, 97110-Therapeutic exercises, 97530- Therapeutic activity, [id number]- Neuromuscular re-education, 97535- Self  Care, [id number]- Manual therapy, [id number]- Gait training, [phone]- Aquatic Therapy, [id number]- Vasopneumatic device, and DME instructions  PLAN FOR NEXT SESSION:  check AROM for objective weekly measurement.   Continue with progressive strengthening exercises for his hip.  Rt hip mobility, work towards [employer].   Grayce Spatz, PT, DPT 05/28/2024, 10:09 AM

## 2024-05-31 ENCOUNTER — Encounter: Payer: Self-pay | Admitting: Physical Therapy

## 2024-05-31 ENCOUNTER — Ambulatory Visit (INDEPENDENT_AMBULATORY_CARE_PROVIDER_SITE_OTHER): Admitting: Physical Therapy

## 2024-05-31 ENCOUNTER — Telehealth: Payer: Self-pay | Admitting: Orthopaedic Surgery

## 2024-05-31 DIAGNOSIS — M6281 Muscle weakness (generalized): Secondary | ICD-10-CM | POA: Diagnosis not present

## 2024-05-31 DIAGNOSIS — R2681 Unsteadiness on feet: Secondary | ICD-10-CM

## 2024-05-31 DIAGNOSIS — M25551 Pain in right hip: Secondary | ICD-10-CM | POA: Diagnosis not present

## 2024-05-31 DIAGNOSIS — R262 Difficulty in walking, not elsewhere classified: Secondary | ICD-10-CM

## 2024-05-31 NOTE — Therapy (Signed)
 OUTPATIENT PHYSICAL THERAPY TREATMENT   Patient Name: Robert Cruz MRN: [id number] DOB:1976-09-07, 48 y.o., male Today's Date: 05/31/2024  END OF SESSION:  PT End of Session - 05/31/24 0938     Visit Number 6    Number of Visits 17    Date for PT Re-Evaluation 07/05/24    Authorization Type [employer]    Authorization Time Period 05/10/24 to 07/05/24    PT Start Time 0932    PT Stop Time 1013    PT Time Calculation (min) 41 min    Activity Tolerance Patient tolerated treatment well    Behavior During Therapy [facility] for tasks assessed/performed               Past Medical History:  Diagnosis Date   Arthritis    gout and osteoarthritis   Chicken pox    Chronic kidney disease    GERD (gastroesophageal reflux disease)    Gout 2022   Hypertension    Morbid obesity (HCC)    Pre-diabetes    Sleep apnea    uses CPAP   Past Surgical History:  Procedure Laterality Date   COLONOSCOPY W/ BIOPSIES     ESOPHAGOGASTRODUODENOSCOPY     x 3   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   Procedure: ARTHROPLASTY, HIP, TOTAL, ANTERIOR APPROACH;  Surgeon: Vernetta Lonni GRADE, MD;  Location: [facility];  Service: Orthopedics;  Laterality: Right;   Patient Active Problem List   Diagnosis Date Noted   Status post total replacement of right hip 04/25/2024   Unilateral primary osteoarthritis, right hip 04/01/2024   URI, acute 08/17/2023   Obesity (BMI 30-39.9) 08/04/2023   Type 2 diabetes mellitus with chronic kidney disease, without long-term current use of insulin (HCC) 08/04/2023   Traumatic complete tear of left rotator cuff 04/12/2023   Traumatic complete tear of right rotator cuff 04/12/2023   Lower respiratory infection 10/04/2022   Right Achilles tendinitis 06/03/2022   Chronic gout 03/30/2022   Diarrhea 12/16/2021   Stage 3a chronic kidney disease (HCC) 09/01/2021   Left elbow pain 10/18/2019   Acute cough 10/03/2019   Mixed hyperlipidemia 09/08/2017   Allergic rhinitis  10/11/2016   Chronic migraine without aura without status migrainosus, not intractable 01/14/2016   Resistant hypertension 01/04/2016   Generalized headache 12/16/2015   Colon cancer screening 09/06/2013   GERD (gastroesophageal reflux disease) 09/06/2013   Dysphagia 02/08/2011   OSA (obstructive sleep apnea) 12/28/2008   Severe obesity (BMI >= 40) (HCC) 10/07/2008    PCP: Lendia Nordmann NP-C   REFERRING PROVIDER: Vernetta Lonni GRADE, MD  REFERRING DIAG: Diagnosis [phone] (ICD-10-CM) - Status post right hip replacement  THERAPY DIAG:  Pain in right hip  Muscle weakness (generalized)  Difficulty in walking, not elsewhere classified  Unsteadiness on feet  Rationale for Evaluation and Treatment: Rehabilitation  ONSET DATE: R anterior THA surgery 04/25/24  SUBJECTIVE:   SUBJECTIVE STATEMENT:  Nothing new since last time, still progressing. Doing OK keeping up with HEP. Its come along since that first day, happy with how its coming along    PERTINENT HISTORY: See above   PAIN:  NPRS scale:  1/10 Pain location: right surgical hip Pain description: general soreness  Aggravating factors: doing too much, some movements like stretches  Relieving factors: rest and ice   PRECAUTIONS: Anterior hip  RED FLAGS: None   WEIGHT BEARING RESTRICTIONS: No  FALLS:  Has patient fallen in last 6 months? 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    PATIENT SURVEYS:   Patient-Specific Activity Scoring Scheme  0  represents "unable to perform." 10 represents "able to perform at prior level. 0 1 2 3 4 5 6 7 8 9  10 (Date and Score)   Activity Eval  05/10/2024 05/22/24   1. Tying shoes  1  7   2. Putting on socks  1   7  3. Walking up and down steps  4 7  4. Work tasks  3  5.    Score 2 6   Total score = sum of the activity scores/number of activities Minimum detectable change (90%CI) for average score = 2 points Minimum detectable change (90%CI) for single activity score = 3 points  COGNITION: 05/10/2024 Overall cognitive status: Within functional limits for tasks assessed     SENSATION: 05/10/2024 Some numbness at incision and where swelling is   EDEMA:  05/10/2024 As expected post-op   LOWER EXTREMITY ROM:      Right 05/14/2024  Hip flexion 95 PROM in supine   Hip extension   Hip abduction   Hip adduction   Hip internal rotation   Hip external rotation 30 PROM in supine 70 deg hip flexion   Knee flexion 26 PROM in supine 70 deg hip flexion  Knee extension   Ankle dorsiflexion   Ankle plantarflexion   Ankle inversion   Ankle eversion    (Blank rows = not tested)   LOWER EXTREMITY MMT:  MMT Right Eval 05/10/2024 Left Eval 05/10/2024  Hip flexion 3 pain limited  5  Hip extension    Hip abduction 3 slight pain 4  Hip adduction    Hip internal rotation    Hip external rotation    Knee flexion 4+ 4+  Knee extension 4 5  Ankle dorsiflexion    Ankle plantarflexion    Ankle inversion    Ankle eversion     (Blank rows = not tested)    FUNCTIONAL TESTS:  05/10/2024 5 times sit to stand: 25.5 seconds offloads surgical LE; 05/31/24 13 seconds no UEs no offshifting   Timed up and go (TUG): 23.5 seconds hurrycane; 05/31/24 8.6 seconds no device   3 minute walk test: 377ft carried hurrycane but didn't use it   GAIT: 05/10/2024 Distance walked: 311ft Assistive device utilized: carried cane but did not use it  Level of assistance: Modified independence Comments: antalgic gait  pattern, reduced gait speed  05/31/24   Scifit bike seat 13 full rotations x8 minutes at L4 (Nustep not available)  5xSTS, TUG   For functional strength and hip mobility:  Forward step ups 2x12 R (1st round with 6 inch box, 2nd round with 8 inch) Lateral step ups  2x12 R (1st round with 6 inch box, 2nd round with 8 inch) Forward step downs  2x12 R (1st round with 6 inch box, 2nd round with 8 inch) 3D hip excursions for hip mobility x20 each direction Squats in // bars self selected depth x12  Shuttle BLE press 100# 2x15  Shuttle single LE press 62# 2x15 B Hip hikes + swing x12 B     TREATMENT         DATE:  05/28/2024 Therapeutic Exercise: NuStep seat 14 BLEs only Level 8 for 8 minutes Gastroc stretch step heel depression 30 sec hold 3 reps Hamstring stretch foot on 2nd step 30 sec hold 3 reps Quad stretch standing with towel to assist knee flexion 30 sec hold 3 reps  Therapeutic Activities: Standing in doorway with PT cues on positioning so that he does not have a lateral trunk lean. RLE abduction control with LLE hip hike. 10 reps hip hiking only, 2nd set of 10 reps to hip hiking and moving left lower extremity forward to height that he can still control right side pelvis from dropping. 3rd set of 10 reps to hip hiking and moving left lower extremity backward to height that he can still control right side pelvis from dropping. 4th set of 10 reps to hip hiking and moving left lower extremity abduction to height that he can still control right side pelvis from dropping. 5th set of 10 reps to hip hiking and moving left lower extremity adduction to height that he can still control right side pelvis from dropping.Patient verbalized understanding of this as an HEP progression. Stairs: step-to pattern 2 rails BUE support 11 steps PT demo & verbal  cues on technique to use RLE to lower & raise body weight.  Pt performed with supervision / cues to correct but no instability noted. Pt appears safe to perform at home. Side step up with BUEs on rail using RLE including active to step RLE on/off step, weight shift over RLE for step-up & step-down for 10 reps.  Pt appears safe to perform at home.   Forward Plank at sink 30 sec hold 3 reps.   PT updated HEP with demo, verbal & HO cues.  Pt verbalized understanding after performing during session.    TREATMENT         DATE:  05/22/2024 Therapeutic Exercise: NuStep seat 14 BLEs only Level 6 for 8 minutes  Therapeutic Activities: Stepping over hurdle alternating LEs with stance press upright thru BLEs 5 reps 6 hurdle & 5 reps 9 hurdle without UE support. Anterior/posterior and laterally  Clam shell with PT correcting form to isolate Gluteus Medius better.  Shoulders, pelvis and feet against a wall (can do on couch at home) 3 reps without resistance, 10 reps with black T-band resistance. 1st set with feet together.  2nd set with right foot 6 higher on wall with no resistance for 10 reps. Pt verbalized better understanding.   Bridge with concentric / eccentric BLEs but stabilization in up motion by sliding single LE outward leg press.  Bridge with Black T-band resistance abduction 10 reps.  PT tactile & verbal cues on update to exercise.  Standing in doorway with PT cues on positioning so  that he does not have a lateral trunk lean.  10 reps hip hiking only, then progressed second set of 10 reps to hip hiking and moving left lower extremity forward to height that he can still control right side pelvis from dropping.  Patient verbalized understanding of this as an HEP progression. PT updated HEP with HO advancing intensity of exercises.  Patient verbalized understanding after performing in clinic.     TREATMENT         DATE:  05/17/2024 NuStep Level 6 for 5 minutes Single knee to chest with other  leg straight 5 x 20 seconds Figure 4 stretch with push 5 x 20 seconds Yoga Bridge 10 x 5 seconds Side lie clams Black Thera-Band 15 x 3 seconds, slow eccentrics  Functional Activities: Hip hike at counter top and in door frame 10 x 5-10 seconds in each position  Neuromuscular re-education: Tandem balance eyes open; head turning and eyes closed 2 x 20 seconds     PATIENT EDUCATION:  05/10/2024 Education details:  HEP update Person educated: Patient Education method: Programmer, multimedia, Demonstration, and Handouts Education comprehension: verbalized understanding, returned demonstration, and verbal cues required  HOME EXERCISE PROGRAM: Access Code: [email or web address] URL: [email or web address] Date: 05/28/2024 Prepared by: Grayce Spatz  Exercises - Supine Single Knee to Chest Stretch  - 2-3 x daily - 7 x weekly - 1 sets - 5 reps - 20 seconds hold - Supine Figure 4 Piriformis Stretch  - 2-3 x daily - 7 x weekly - 1 sets - 5 reps - 20 seconds hold - Supine Bridge  - 1-2 x daily - 7 x weekly - 2 sets - 10 reps - 5 seconds hold - Supine Bridge with Resistance Band  - 1 x daily - 7 x weekly - 2 sets - 10 reps - 5 seconds hold - Supine Bridge with Leg Extension  - 1 x daily - 7 x weekly - 2 sets - 10 reps - 5 seconds hold - Clamshell (Mirrored)  - 1-2 x daily - 7 x weekly - 2 sets - 10-15 reps - 3 seconds hold - Seated Quad Set  - 3-5 x daily - 7 x weekly - 1 sets - 10 reps - 5 hold - Tandem Stance in Corner  - 1-2 x daily - 7 x weekly - 1 sets - 5 reps - 20-30 seconds hold - Standing Hip Hiking  - 2-3 x daily - 7 x weekly - 2 sets - 10 reps - 5-10 seconds hold - standing hip hike with leg kick 4 directions  - 1 x daily - 7 x weekly - 2 sets - 10 reps - 5 seconds hold - Gastroc Stretch on Step  - 1-2 x daily - 7 x weekly - 1 sets - 3 reps - 20-30 seconds hold - Standing Hamstring Stretch with Step  - 1-2 x daily - 7 x weekly - 1 sets - 3 reps - 20-30 seconds hold - Standing Quad Stretch  with Towel and Arm Support  - 1-2 x daily - 7 x weekly - 1 sets - 3 reps - 20-30 seconds hold - Standing Plank on Wall  - 1 x daily - 7 x weekly - 1 sets - 3 reps - 30 seconds hold  ASSESSMENT:  CLINICAL IMPRESSION:  Updated 5xSTS and TUG today, doing well overall and sounds like he's making some consistent improvements. Otherwise kept working on strengthening and balance work today, sounds like per MD notes he may be potentially headed  back to work early August- this definitely seems reasonable given rate of progress with skilled PT services.    OBJECTIVE IMPAIRMENTS: Abnormal gait, decreased activity tolerance, decreased balance, decreased knowledge of use of DME, decreased mobility, difficulty walking, decreased ROM, decreased strength, impaired flexibility, impaired sensation, obesity, and pain.   ACTIVITY LIMITATIONS: carrying, lifting, sitting, standing, squatting, sleeping, stairs, transfers, and locomotion level  PARTICIPATION LIMITATIONS: driving, shopping, community activity, occupation, and yard work  PERSONAL FACTORS: Age, Fitness, Past/current experiences, and Time since onset of injury/illness/exacerbation are also affecting patient's functional outcome.   REHAB POTENTIAL: Excellent  CLINICAL DECISION MAKING: Stable/uncomplicated  EVALUATION COMPLEXITY: Low   GOALS: Goals reviewed with patient? Yes  SHORT TERM GOALS: Target date: 06/07/2024   Will be compliant with appropriate progressive HEP  Baseline: Goal status: Met 05/22/2024 for HEP to date  2.  No pain with car transfers or bed mobility  Baseline:  Goal status: MET 05/31/24  3.  Gait deviations to have normalized  Baseline:  Goal status: Ongoing   05/28/2024  4.  Will complete 5xSTS test in 17 seconds or less without off-shifting from surgical LE  Baseline:  Goal status: MET 05/31/24    LONG TERM GOALS: Target date: 07/05/2024  MMT to have improved by at least 1 grade in all weak groups  Baseline:   Goal status: Ongoing   05/28/2024  2.  Will complete TUG and 5XSTS in 12 seconds or less no device  Baseline:  Goal status: PARTIALLY MET 05/31/24 TUG 8.6 seconds, 5xSTS 13 seconds   3.  Will ambulate at least 51ft in 3WMT no device no gait deviations  Baseline:  Goal status:   Ongoing   05/28/2024  4.  Will be able to perform simulated work duties in clinic with good mechanics and no increased pain  Baseline:  Goal status: Ongoing  05/28/2024  5.  PSFS to have improved by at least 3 points  Baseline:  Goal status: Ongoing  05/28/2024     PLAN:  PT FREQUENCY: 2x/week  PT DURATION: 8 weeks  PLANNED INTERVENTIONS: 97750- Physical Performance Testing, 97110-Therapeutic exercises, 97530- Therapeutic activity, [id number]- Neuromuscular re-education, 97535- Self Care, [id number]- Manual therapy, [id number]- Gait training, [phone]- Aquatic Therapy, [id number]- Vasopneumatic device, and DME instructions  PLAN FOR NEXT SESSION:    Continue with progressive strengthening exercises for his hip.  Rt hip mobility, work towards STG's.  Josette Rough, PT, DPT 05/31/24 10:13 AM

## 2024-05-31 NOTE — Telephone Encounter (Signed)
 Pt came in office signed medical release form Tammy has pt [place]  Life forms.

## 2024-06-03 NOTE — Therapy (Incomplete)
 OUTPATIENT PHYSICAL THERAPY TREATMENT   Patient Name: Robert Cruz MRN: [id number] DOB:Apr 18, 1976, 48 y.o., male Today's Date: 06/04/2024  END OF SESSION:  PT End of Session - 06/04/24 0844     Visit Number 7    Number of Visits 17    Date for PT Re-Evaluation 07/05/24    Authorization Type [employer]    Authorization Time Period 05/10/24 to 07/05/24    PT Start Time 0806    PT Stop Time 0844    PT Time Calculation (min) 38 min    Activity Tolerance Patient tolerated treatment well    Behavior During Therapy [facility] for tasks assessed/performed                Past Medical History:  Diagnosis Date   Arthritis    gout and osteoarthritis   Chicken pox    Chronic kidney disease    GERD (gastroesophageal reflux disease)    Gout 2022   Hypertension    Morbid obesity (HCC)    Pre-diabetes    Sleep apnea    uses CPAP   Past Surgical History:  Procedure Laterality Date   COLONOSCOPY W/ BIOPSIES     ESOPHAGOGASTRODUODENOSCOPY     x 3   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   Procedure: ARTHROPLASTY, HIP, TOTAL, ANTERIOR APPROACH;  Surgeon: Vernetta Lonni GRADE, MD;  Location: [facility];  Service: Orthopedics;  Laterality: Right;   Patient Active Problem List   Diagnosis Date Noted   Status post total replacement of right hip 04/25/2024   Unilateral primary osteoarthritis, right hip 04/01/2024   URI, acute 08/17/2023   Obesity (BMI 30-39.9) 08/04/2023   Type 2 diabetes mellitus with chronic kidney disease, without long-term current use of insulin (HCC) 08/04/2023   Traumatic complete tear of left rotator cuff 04/12/2023   Traumatic complete tear of right rotator cuff 04/12/2023   Lower respiratory infection 10/04/2022   Right Achilles tendinitis 06/03/2022   Chronic gout 03/30/2022   Diarrhea 12/16/2021   Stage 3a chronic kidney disease (HCC) 09/01/2021   Left elbow pain 10/18/2019   Acute cough 10/03/2019   Mixed hyperlipidemia 09/08/2017   Allergic rhinitis  10/11/2016   Chronic migraine without aura without status migrainosus, not intractable 01/14/2016   Resistant hypertension 01/04/2016   Generalized headache 12/16/2015   Colon cancer screening 09/06/2013   GERD (gastroesophageal reflux disease) 09/06/2013   Dysphagia 02/08/2011   OSA (obstructive sleep apnea) 12/28/2008   Severe obesity (BMI >= 40) (HCC) 10/07/2008    PCP: Lendia Nordmann NP-C   REFERRING PROVIDER: Vernetta Lonni GRADE, MD  REFERRING DIAG: Diagnosis [phone] (ICD-10-CM) - Status post right hip replacement  THERAPY DIAG:  Pain in right hip  Muscle weakness (generalized)  Difficulty in walking, not elsewhere classified  Unsteadiness on feet  Rationale for Evaluation and Treatment: Rehabilitation  ONSET DATE: R anterior THA surgery 04/25/24  SUBJECTIVE:   SUBJECTIVE STATEMENT: Patient reports feeling no soreness after previous PT session and feeling good with overall progress. Consistent with his HEP. He expresses he has no pain during sleep or any overall fatigue.    PERTINENT HISTORY: See above   PAIN:  NPRS scale: 0/10 Pain location: right surgical hip Pain description: general soreness  Aggravating factors: doing too much, some movements like stretches  Relieving factors: rest and ice   PRECAUTIONS: Anterior hip  RED FLAGS: None   WEIGHT BEARING RESTRICTIONS: No  FALLS:  Has patient fallen in last 6 months? 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 Occupation demands include stepping up and down from box truck with tires that weigh between10-20 pounds. PT educated patient on how to unload/load easier by putting tires at edge of truck, stepping out of truck, and then grabbing the tire.   PLOF: Independent, Independent with basic ADLs, Independent with gait,  and Independent with transfers  PATIENT GOALS: get stronger, get back to PLOF   NEXT MD VISIT: Referring 05/23/24  OBJECTIVE:  Note: Objective measures were completed at Evaluation unless otherwise noted.    PATIENT SURVEYS:   Patient-Specific Activity Scoring Scheme  0 represents "unable to perform." 10 represents "able to perform at prior level. 0 1 2 3 4 5 6 7 8 9  10 (Date and Score)   Activity Eval  05/10/2024 05/22/24   1. Tying shoes  1  7   2. Putting on socks  1   7  3. Walking up and down steps  4 7  4. Work tasks  3  5.    Score 2 6   Total score = sum of the activity scores/number of activities Minimum detectable change (90%CI) for average score = 2 points Minimum detectable change (90%CI) for single activity score = 3 points  COGNITION: 05/10/2024 Overall cognitive status: Within functional limits for tasks assessed     SENSATION: 05/10/2024 Some numbness at incision and where swelling is   EDEMA:  05/10/2024 As expected post-op   LOWER EXTREMITY ROM:      Right 05/14/2024  Hip flexion 95 PROM in supine   Hip extension   Hip abduction   Hip adduction   Hip internal rotation   Hip external rotation 30 PROM in supine 70 deg hip flexion   Knee flexion 26 PROM in supine 70 deg hip flexion  Knee extension   Ankle dorsiflexion   Ankle plantarflexion   Ankle inversion   Ankle eversion    (Blank rows = not tested)   LOWER EXTREMITY MMT:  MMT Right Eval 05/10/2024 Left Eval 05/10/2024  Hip flexion 3 pain limited  5  Hip extension    Hip abduction 3 slight pain 4  Hip adduction    Hip internal rotation    Hip external rotation    Knee flexion 4+ 4+  Knee extension 4 5  Ankle dorsiflexion    Ankle plantarflexion    Ankle inversion    Ankle eversion     (Blank rows = not tested)    FUNCTIONAL TESTS:  05/10/2024 5 times sit to stand: 25.5 seconds offloads surgical LE; 05/31/24 13 seconds no UEs no offshifting   Timed up and go (TUG): 23.5  seconds hurrycane; 05/31/24 8.6 seconds no device   3 minute walk test: 363ft carried hurrycane but didn't use it   GAIT: 05/10/2024 Distance walked: 319ft Assistive device utilized: carried cane but did not use it  Level of assistance: Modified independence Comments: antalgic gait pattern, reduced gait speed                     TREATMENT         DATE:  06/04/2024 Therapeutic Exercise: Recumbent bike seat 9 BLEs only, Level 4 for 8 minutes  Therapeutic Activities: Forward step ups and down in // bars, no UE support on step up, L UE support with step downs, down with L  LE leading and up with R LE leading, 2x12 with 8 inch box. Patient demonstrated control on ascent and descent. PT demo and verbal cues when necessary.  Lateral step ups in // bars with 8 inch box. R LE leading lateral step up with L LE crossing in front and behind to touch corner of box, 2x12. UE finger assist. Patient demonstrated control on ascent and crossing of L LE. Last 3 reps of first set, patient attempted to do exercise with no UE support, only minimal sway. PT demo and verbal cues when necessary.                                                                                                                              05/31/24   Scifit bike seat 13 full rotations x8 minutes at L4 (Nustep not available)  5xSTS, TUG   For functional strength and hip mobility:  Forward step ups 2x12 R (1st round with 6 inch box, 2nd round with 8 inch) Lateral step ups  2x12 R (1st round with 6 inch box, 2nd round with 8 inch) Forward step downs  2x12 R (1st round with 6 inch box, 2nd round with 8 inch) 3D hip excursions for hip mobility x20 each direction Squats in // bars self selected depth x12  Shuttle BLE press 100# 2x15  Shuttle single LE press 62# 2x15 B Hip hikes + swing x12 B     TREATMENT         DATE:  05/28/2024 Therapeutic Exercise: NuStep seat 14 BLEs only Level 8 for 8 minutes Gastroc stretch step heel  depression 30 sec hold 3 reps Hamstring stretch foot on 2nd step 30 sec hold 3 reps Quad stretch standing with towel to assist knee flexion 30 sec hold 3 reps  Therapeutic Activities: Standing in doorway with PT cues on positioning so that he does not have a lateral trunk lean. RLE abduction control with LLE hip hike. 10 reps hip hiking only, 2nd set of 10 reps to hip hiking and moving left lower extremity forward to height that he can still control right side pelvis from dropping. 3rd set of 10 reps to hip hiking and moving left lower extremity backward to height that he can still control right side pelvis from dropping. 4th set of 10 reps to hip hiking and moving left lower extremity abduction to height that he can still control right side pelvis from dropping. 5th set of 10 reps to hip hiking and moving left lower extremity adduction to height that he can still control right side pelvis from dropping.Patient verbalized understanding of this as an HEP progression. Stairs: step-to pattern 2 rails BUE support 11 steps PT demo & verbal cues on technique to use RLE to lower & raise body weight.  Pt performed with supervision / cues to correct but no instability noted. Pt appears safe to perform at home. Side step up with BUEs on rail using RLE including  active to step RLE on/off step, weight shift over RLE for step-up & step-down for 10 reps.  Pt appears safe to perform at home.   Forward Plank at sink 30 sec hold 3 reps.   PT updated HEP with demo, verbal & HO cues.  Pt verbalized understanding after performing during session.    TREATMENT         DATE:  05/22/2024 Therapeutic Exercise: NuStep seat 14 BLEs only Level 6 for 8 minutes  Therapeutic Activities: Stepping over hurdle alternating LEs with stance press upright thru BLEs 5 reps 6 hurdle & 5 reps 9 hurdle without UE support. Anterior/posterior and laterally  Clam shell with PT correcting form to isolate Gluteus Medius better.  Shoulders,  pelvis and feet against a wall (can do on couch at home) 3 reps without resistance, 10 reps with black T-band resistance. 1st set with feet together.  2nd set with right foot 6 higher on wall with no resistance for 10 reps. Pt verbalized better understanding.   Bridge with concentric / eccentric BLEs but stabilization in up motion by sliding single LE outward leg press.  Bridge with Black T-band resistance abduction 10 reps.  PT tactile & verbal cues on update to exercise.  Standing in doorway with PT cues on positioning so that he does not have a lateral trunk lean.  10 reps hip hiking only, then progressed second set of 10 reps to hip hiking and moving left lower extremity forward to height that he can still control right side pelvis from dropping.  Patient verbalized understanding of this as an HEP progression. PT updated HEP with HO advancing intensity of exercises.  Patient verbalized understanding after performing in clinic.    PATIENT EDUCATION:  05/10/2024 Education details:  HEP update Person educated: Patient Education method: Explanation, Demonstration, and Handouts Education comprehension: verbalized understanding, returned demonstration, and verbal cues required  HOME EXERCISE PROGRAM: Access Code: [email or web address] URL: [email or web address] Date: 05/28/2024 Prepared by: Grayce Spatz  Exercises - Supine Single Knee to Chest Stretch  - 2-3 x daily - 7 x weekly - 1 sets - 5 reps - 20 seconds hold - Supine Figure 4 Piriformis Stretch  - 2-3 x daily - 7 x weekly - 1 sets - 5 reps - 20 seconds hold - Supine Bridge  - 1-2 x daily - 7 x weekly - 2 sets - 10 reps - 5 seconds hold - Supine Bridge with Resistance Band  - 1 x daily - 7 x weekly - 2 sets - 10 reps - 5 seconds hold - Supine Bridge with Leg Extension  - 1 x daily - 7 x weekly - 2 sets - 10 reps - 5 seconds hold - Clamshell (Mirrored)  - 1-2 x daily - 7 x weekly - 2 sets - 10-15 reps - 3 seconds hold - Seated Quad Set   - 3-5 x daily - 7 x weekly - 1 sets - 10 reps - 5 hold - Tandem Stance in Corner  - 1-2 x daily - 7 x weekly - 1 sets - 5 reps - 20-30 seconds hold - Standing Hip Hiking  - 2-3 x daily - 7 x weekly - 2 sets - 10 reps - 5-10 seconds hold - standing hip hike with leg kick 4 directions  - 1 x daily - 7 x weekly - 2 sets - 10 reps - 5 seconds hold - Gastroc Stretch on Step  - 1-2 x daily - 7 x weekly - 1 sets -  3 reps - 20-30 seconds hold - Standing Hamstring Stretch with Step  - 1-2 x daily - 7 x weekly - 1 sets - 3 reps - 20-30 seconds hold - Standing Quad Stretch with Towel and Arm Support  - 1-2 x daily - 7 x weekly - 1 sets - 3 reps - 20-30 seconds hold - Standing Plank on Wall  - 1 x daily - 7 x weekly - 1 sets - 3 reps - 30 seconds hold  ASSESSMENT:  CLINICAL IMPRESSION: Patient did well overall and was successful with step up progressions. Continues to progress strengthening exercises for R hip to achieve functional demands required for occupation. Patient will continue to benefit from skilled PT to reach his goals.   OBJECTIVE IMPAIRMENTS: Abnormal gait, decreased activity tolerance, decreased balance, decreased knowledge of use of DME, decreased mobility, difficulty walking, decreased ROM, decreased strength, impaired flexibility, impaired sensation, obesity, and pain.   ACTIVITY LIMITATIONS: carrying, lifting, sitting, standing, squatting, sleeping, stairs, transfers, and locomotion level  PARTICIPATION LIMITATIONS: driving, shopping, community activity, occupation, and yard work  PERSONAL FACTORS: Age, Fitness, Past/current experiences, and Time since onset of injury/illness/exacerbation are also affecting patient's functional outcome.   REHAB POTENTIAL: Excellent  CLINICAL DECISION MAKING: Stable/uncomplicated  EVALUATION COMPLEXITY: Low   GOALS: Goals reviewed with patient? Yes  SHORT TERM GOALS: Target date: 06/07/2024   Will be compliant with appropriate progressive HEP   Baseline: Goal status: Met 05/22/2024 for HEP to date  2.  No pain with car transfers or bed mobility  Baseline:  Goal status: MET 05/31/24  3.  Gait deviations to have normalized  Baseline:  Goal status: Ongoing   05/28/2024  4.  Will complete 5xSTS test in 17 seconds or less without off-shifting from surgical LE  Baseline:  Goal status: MET 05/31/24    LONG TERM GOALS: Target date: 07/05/2024  MMT to have improved by at least 1 grade in all weak groups  Baseline:  Goal status: Ongoing   05/28/2024  2.  Will complete TUG and 5XSTS in 12 seconds or less no device  Baseline:  Goal status: PARTIALLY MET 05/31/24 TUG 8.6 seconds, 5xSTS 13 seconds   3.  Will ambulate at least 557ft in 3WMT no device no gait deviations  Baseline:  Goal status:   Ongoing   05/28/2024  4.  Will be able to perform simulated work duties in clinic with good mechanics and no increased pain  Baseline:  Goal status: Ongoing  05/28/2024  5.  PSFS to have improved by at least 3 points  Baseline:  Goal status: Ongoing  05/28/2024     PLAN:  PT FREQUENCY: 2x/week  PT DURATION: 8 weeks  PLANNED INTERVENTIONS: 97750- Physical Performance Testing, 97110-Therapeutic exercises, 97530- Therapeutic activity, [id number]- Neuromuscular re-education, 97535- Self Care, [id number]- Manual therapy, [id number]- Gait training, [phone]- Aquatic Therapy, [id number]- Vasopneumatic device, and DME instructions  PLAN FOR NEXT SESSION:   Continue progression of strengthening activities for R hip. Activities such as walking with kettle bells or squats with weight in preparation for occupation demands. Continue to work towards STGs and LTGs   Bellarae Lizer Rodriguez, [place] 06/04/24 12:15 PM  This entire session of physical therapy was performed under the direct supervision of PT signing evaluation /treatment. PT reviewed note and agrees.   Robin Waldron, PT, DPT 06/04/2024, 1:05 PM

## 2024-06-04 ENCOUNTER — Ambulatory Visit (INDEPENDENT_AMBULATORY_CARE_PROVIDER_SITE_OTHER): Admitting: Physical Therapy

## 2024-06-04 ENCOUNTER — Encounter: Payer: Self-pay | Admitting: Physical Therapy

## 2024-06-04 DIAGNOSIS — M25551 Pain in right hip: Secondary | ICD-10-CM | POA: Diagnosis not present

## 2024-06-04 DIAGNOSIS — R262 Difficulty in walking, not elsewhere classified: Secondary | ICD-10-CM

## 2024-06-04 DIAGNOSIS — M6281 Muscle weakness (generalized): Secondary | ICD-10-CM

## 2024-06-04 DIAGNOSIS — R2681 Unsteadiness on feet: Secondary | ICD-10-CM

## 2024-06-06 ENCOUNTER — Encounter: Admitting: Physical Therapy

## 2024-06-06 NOTE — Therapy (Incomplete)
 OUTPATIENT PHYSICAL THERAPY TREATMENT   Patient Name: Robert Cruz MRN: [id number] DOB:[birth year], 48 y.o., male Today's Date: 06/06/2024  END OF SESSION:          Past Medical History:  Diagnosis Date   Arthritis    gout and osteoarthritis   Chicken pox    Chronic kidney disease    GERD (gastroesophageal reflux disease)    Gout 2022   Hypertension    Morbid obesity (HCC)    Pre-diabetes    Sleep apnea    uses CPAP   Past Surgical History:  Procedure Laterality Date   COLONOSCOPY W/ BIOPSIES     ESOPHAGOGASTRODUODENOSCOPY     x 3   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   Procedure: ARTHROPLASTY, HIP, TOTAL, ANTERIOR APPROACH;  Surgeon: Vernetta Lonni GRADE, MD;  Location: [facility];  Service: Orthopedics;  Laterality: Right;   Patient Active Problem List   Diagnosis Date Noted   Status post total replacement of right hip 04/25/2024   Unilateral primary osteoarthritis, right hip 04/01/2024   URI, acute 08/17/2023   Obesity (BMI 30-39.9) 08/04/2023   Type 2 diabetes mellitus with chronic kidney disease, without long-term current use of insulin (HCC) 08/04/2023   Traumatic complete tear of left rotator cuff 04/12/2023   Traumatic complete tear of right rotator cuff 04/12/2023   Lower respiratory infection 10/04/2022   Right Achilles tendinitis 06/03/2022   Chronic gout 03/30/2022   Diarrhea 12/16/2021   Stage 3a chronic kidney disease (HCC) 09/01/2021   Left elbow pain 10/18/2019   Acute cough 10/03/2019   Mixed hyperlipidemia 09/08/2017   Allergic rhinitis 10/11/2016   Chronic migraine without aura without status migrainosus, not intractable 01/14/2016   Resistant hypertension 01/04/2016   Generalized headache 12/16/2015   Colon cancer screening 09/06/2013   GERD (gastroesophageal reflux disease) 09/06/2013   Dysphagia 02/08/2011   OSA (obstructive sleep apnea) 12/28/2008   Severe obesity (BMI >= 40) (HCC) 10/07/2008    PCP: Lendia Nordmann NP-C    REFERRING PROVIDER: Vernetta Lonni GRADE, MD  REFERRING DIAG: Diagnosis [phone] (ICD-10-CM) - Status post right hip replacement  THERAPY DIAG:  No diagnosis found.  Rationale for Evaluation and Treatment: Rehabilitation  ONSET DATE: R anterior THA surgery 04/25/24  SUBJECTIVE:   SUBJECTIVE STATEMENT: ***   Patient reports feeling no soreness after previous PT session and feeling good with overall progress. Consistent with his HEP. He expresses he has no pain during sleep or any overall fatigue.    PERTINENT HISTORY: See above   PAIN:  NPRS scale: 0/10 Pain location: right surgical hip Pain description: general soreness  Aggravating factors: doing too much, some movements like stretches  Relieving factors: rest and ice   PRECAUTIONS: Anterior hip  RED FLAGS: None   WEIGHT BEARING RESTRICTIONS: No  FALLS:  Has patient fallen in last 6 months? 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 Occupation demands include stepping up and down from box truck with tires that weigh between10-20 pounds. PT educated patient on how to unload/load easier by putting tires at edge of truck, stepping out of truck, and then grabbing the tire.   PLOF: Independent, Independent with basic ADLs, Independent with gait, and Independent with transfers  PATIENT GOALS: get stronger, get back to PLOF   NEXT MD VISIT: Referring 05/23/24  OBJECTIVE:  Note: Objective measures  were completed at Evaluation unless otherwise noted.    PATIENT SURVEYS:   Patient-Specific Activity Scoring Scheme  0 represents "unable to perform." 10 represents "able to perform at prior level. 0 1 2 3 4 5 6 7 8 9  10 (Date and Score)   Activity Eval  05/10/2024 05/22/24   1. Tying shoes  1  7   2. Putting on socks  1   7  3.  Walking up and down steps  4 7  4. Work tasks  3  5.    Score 2 6   Total score = sum of the activity scores/number of activities Minimum detectable change (90%CI) for average score = 2 points Minimum detectable change (90%CI) for single activity score = 3 points  COGNITION: 05/10/2024 Overall cognitive status: Within functional limits for tasks assessed     SENSATION: 05/10/2024 Some numbness at incision and where swelling is   EDEMA:  05/10/2024 As expected post-op   LOWER EXTREMITY ROM:      Right 05/14/2024  Hip flexion 95 PROM in supine   Hip extension   Hip abduction   Hip adduction   Hip internal rotation   Hip external rotation 30 PROM in supine 70 deg hip flexion   Knee flexion 26 PROM in supine 70 deg hip flexion  Knee extension   Ankle dorsiflexion   Ankle plantarflexion   Ankle inversion   Ankle eversion    (Blank rows = not tested)   LOWER EXTREMITY MMT:  MMT Right Eval 05/10/2024 Left Eval 05/10/2024  Hip flexion 3 pain limited  5  Hip extension    Hip abduction 3 slight pain 4  Hip adduction    Hip internal rotation    Hip external rotation    Knee flexion 4+ 4+  Knee extension 4 5  Ankle dorsiflexion    Ankle plantarflexion    Ankle inversion    Ankle eversion     (Blank rows = not tested)    FUNCTIONAL TESTS:  05/10/2024 5 times sit to stand: 25.5 seconds offloads surgical LE; 05/31/24 13 seconds no UEs no offshifting   Timed up and go (TUG): 23.5 seconds hurrycane; 05/31/24 8.6 seconds no device   3 minute walk test: 318ft carried hurrycane but didn't use it   GAIT: 05/10/2024 Distance walked: 345ft Assistive device utilized: carried cane but did not use it  Level of assistance: Modified independence Comments: antalgic gait pattern, reduced gait speed                     TREATMENT         DATE:  06/06/2024 Therapeutic Exercise:  *** Recumbent bike seat 9 BLEs only, Level 4 for 8 minutes  Therapeutic Activities: Forward step ups and  down in // bars, no UE support on step up, L UE support with step downs, down with L LE leading and up with R LE leading, 2x12 with 8 inch box. Patient demonstrated control on ascent and descent. PT demo and verbal cues when necessary.  Lateral step ups in // bars with 8 inch box. R LE leading lateral step up with L LE crossing in front and behind to touch corner of box, 2x12. UE finger assist. Patient demonstrated control on ascent and crossing of L LE. Last 3 reps of first set, patient attempted to do exercise with no UE support, only minimal sway. PT demo and verbal cues when necessary.  TREATMENT         DATE:  06/04/2024 Therapeutic Exercise: Recumbent bike seat 9 BLEs only, Level 4 for 8 minutes  Therapeutic Activities: Forward step ups and down in // bars, no UE support on step up, L UE support with step downs, down with L LE leading and up with R LE leading, 2x12 with 8 inch box. Patient demonstrated control on ascent and descent. PT demo and verbal cues when necessary.  Lateral step ups in // bars with 8 inch box. R LE leading lateral step up with L LE crossing in front and behind to touch corner of box, 2x12. UE finger assist. Patient demonstrated control on ascent and crossing of L LE. Last 3 reps of first set, patient attempted to do exercise with no UE support, only minimal sway. PT demo and verbal cues when necessary.                                                                                                                              05/31/24   Scifit bike seat 13 full rotations x8 minutes at L4 (Nustep not available)  5xSTS, TUG   For functional strength and hip mobility:  Forward step ups 2x12 R (1st round with 6 inch box, 2nd round with 8 inch) Lateral step ups  2x12 R (1st round with 6 inch box, 2nd round with 8 inch) Forward step downs  2x12 R (1st round with 6 inch box, 2nd round with 8 inch) 3D hip excursions for hip mobility x20 each  direction Squats in // bars self selected depth x12  Shuttle BLE press 100# 2x15  Shuttle single LE press 62# 2x15 B Hip hikes + swing x12 B     TREATMENT         DATE:  05/28/2024 Therapeutic Exercise: NuStep seat 14 BLEs only Level 8 for 8 minutes Gastroc stretch step heel depression 30 sec hold 3 reps Hamstring stretch foot on 2nd step 30 sec hold 3 reps Quad stretch standing with towel to assist knee flexion 30 sec hold 3 reps  Therapeutic Activities: Standing in doorway with PT cues on positioning so that he does not have a lateral trunk lean. RLE abduction control with LLE hip hike. 10 reps hip hiking only, 2nd set of 10 reps to hip hiking and moving left lower extremity forward to height that he can still control right side pelvis from dropping. 3rd set of 10 reps to hip hiking and moving left lower extremity backward to height that he can still control right side pelvis from dropping. 4th set of 10 reps to hip hiking and moving left lower extremity abduction to height that he can still control right side pelvis from dropping. 5th set of 10 reps to hip hiking and moving left lower extremity adduction to height that he can still control right side pelvis from dropping.Patient verbalized understanding of this as an HEP progression. Stairs: step-to pattern 2  rails BUE support 11 steps PT demo & verbal cues on technique to use RLE to lower & raise body weight.  Pt performed with supervision / cues to correct but no instability noted. Pt appears safe to perform at home. Side step up with BUEs on rail using RLE including active to step RLE on/off step, weight shift over RLE for step-up & step-down for 10 reps.  Pt appears safe to perform at home.   Forward Plank at sink 30 sec hold 3 reps.   PT updated HEP with demo, verbal & HO cues.  Pt verbalized understanding after performing during session.    TREATMENT         DATE:  05/22/2024 Therapeutic Exercise: NuStep seat 14 BLEs only Level 6  for 8 minutes  Therapeutic Activities: Stepping over hurdle alternating LEs with stance press upright thru BLEs 5 reps 6 hurdle & 5 reps 9 hurdle without UE support. Anterior/posterior and laterally  Clam shell with PT correcting form to isolate Gluteus Medius better.  Shoulders, pelvis and feet against a wall (can do on couch at home) 3 reps without resistance, 10 reps with black T-band resistance. 1st set with feet together.  2nd set with right foot 6 higher on wall with no resistance for 10 reps. Pt verbalized better understanding.   Bridge with concentric / eccentric BLEs but stabilization in up motion by sliding single LE outward leg press.  Bridge with Black T-band resistance abduction 10 reps.  PT tactile & verbal cues on update to exercise.  Standing in doorway with PT cues on positioning so that he does not have a lateral trunk lean.  10 reps hip hiking only, then progressed second set of 10 reps to hip hiking and moving left lower extremity forward to height that he can still control right side pelvis from dropping.  Patient verbalized understanding of this as an HEP progression. PT updated HEP with HO advancing intensity of exercises.  Patient verbalized understanding after performing in clinic.    PATIENT EDUCATION:  05/10/2024 Education details:  HEP update Person educated: Patient Education method: Explanation, Demonstration, and Handouts Education comprehension: verbalized understanding, returned demonstration, and verbal cues required  HOME EXERCISE PROGRAM: Access Code: [email or web address] URL: [email or web address] Date: 05/28/2024 Prepared by: Grayce Spatz  Exercises - Supine Single Knee to Chest Stretch  - 2-3 x daily - 7 x weekly - 1 sets - 5 reps - 20 seconds hold - Supine Figure 4 Piriformis Stretch  - 2-3 x daily - 7 x weekly - 1 sets - 5 reps - 20 seconds hold - Supine Bridge  - 1-2 x daily - 7 x weekly - 2 sets - 10 reps - 5 seconds hold - Supine Bridge  with Resistance Band  - 1 x daily - 7 x weekly - 2 sets - 10 reps - 5 seconds hold - Supine Bridge with Leg Extension  - 1 x daily - 7 x weekly - 2 sets - 10 reps - 5 seconds hold - Clamshell (Mirrored)  - 1-2 x daily - 7 x weekly - 2 sets - 10-15 reps - 3 seconds hold - Seated Quad Set  - 3-5 x daily - 7 x weekly - 1 sets - 10 reps - 5 hold - Tandem Stance in Corner  - 1-2 x daily - 7 x weekly - 1 sets - 5 reps - 20-30 seconds hold - Standing Hip Hiking  - 2-3 x daily - 7 x weekly - 2 sets -  10 reps - 5-10 seconds hold - standing hip hike with leg kick 4 directions  - 1 x daily - 7 x weekly - 2 sets - 10 reps - 5 seconds hold - Gastroc Stretch on Step  - 1-2 x daily - 7 x weekly - 1 sets - 3 reps - 20-30 seconds hold - Standing Hamstring Stretch with Step  - 1-2 x daily - 7 x weekly - 1 sets - 3 reps - 20-30 seconds hold - Standing Quad Stretch with Towel and Arm Support  - 1-2 x daily - 7 x weekly - 1 sets - 3 reps - 20-30 seconds hold - Standing Plank on Wall  - 1 x daily - 7 x weekly - 1 sets - 3 reps - 30 seconds hold  ASSESSMENT:  CLINICAL IMPRESSION: Patient did well overall and was successful with step up progressions. Continues to progress strengthening exercises for R hip to achieve functional demands required for occupation. Patient will continue to benefit from skilled PT to reach his goals.   OBJECTIVE IMPAIRMENTS: Abnormal gait, decreased activity tolerance, decreased balance, decreased knowledge of use of DME, decreased mobility, difficulty walking, decreased ROM, decreased strength, impaired flexibility, impaired sensation, obesity, and pain.   ACTIVITY LIMITATIONS: carrying, lifting, sitting, standing, squatting, sleeping, stairs, transfers, and locomotion level  PARTICIPATION LIMITATIONS: driving, shopping, community activity, occupation, and yard work  PERSONAL FACTORS: Age, Fitness, Past/current experiences, and Time since onset of injury/illness/exacerbation are also  affecting patient's functional outcome.   REHAB POTENTIAL: Excellent  CLINICAL DECISION MAKING: Stable/uncomplicated  EVALUATION COMPLEXITY: Low   GOALS: Goals reviewed with patient? Yes  SHORT TERM GOALS: Target date: 06/07/2024   Will be compliant with appropriate progressive HEP  Baseline: Goal status: Met 05/22/2024 for HEP to date  2.  No pain with car transfers or bed mobility  Baseline:  Goal status: MET 05/31/24  3.  Gait deviations to have normalized  Baseline:  Goal status: Ongoing   06/06/2024  4.  Will complete 5xSTS test in 17 seconds or less without off-shifting from surgical LE  Baseline:  Goal status: MET 05/31/24    LONG TERM GOALS: Target date: 07/05/2024  MMT to have improved by at least 1 grade in all weak groups  Baseline:  Goal status: Ongoing   06/06/2024  2.  Will complete TUG and 5XSTS in 12 seconds or less no device  Baseline:  Goal status: PARTIALLY MET 05/31/24 TUG 8.6 seconds, 5xSTS 13 seconds   3.  Will ambulate at least 547ft in 3WMT no device no gait deviations  Baseline:  Goal status:   Ongoing  06/06/2024  4.  Will be able to perform simulated work duties in clinic with good mechanics and no increased pain  Baseline:  Goal status: Ongoing  06/06/2024  5.  PSFS to have improved by at least 3 points  Baseline:  Goal status: Ongoing  06/06/2024     PLAN:  PT FREQUENCY: 2x/week  PT DURATION: 8 weeks  PLANNED INTERVENTIONS: 97750- Physical Performance Testing, 97110-Therapeutic exercises, 97530- Therapeutic activity, [id number]- Neuromuscular re-education, 97535- Self Care, [id number]- Manual therapy, [id number]- Gait training, [phone]- Aquatic Therapy, [id number]- Vasopneumatic device, and DME instructions  PLAN FOR NEXT SESSION:   ***   Continue progression of strengthening activities for R hip. Activities such as walking with kettle bells or squats with weight in preparation for occupation demands. Continue to work towards STGs and Bradly Grayce Spatz, PT,  DPT 06/06/2024, 7:06 AM

## 2024-06-10 NOTE — Therapy (Incomplete)
 OUTPATIENT PHYSICAL THERAPY TREATMENT   Patient Name: Robert Cruz MRN: [id number] DOB:1976/10/01, 48 y.o., male Today's Date: 06/11/2024  END OF SESSION:  PT End of Session - 06/11/24 0850     Visit Number 8    Number of Visits 17    Date for PT Re-Evaluation 07/05/24    Authorization Type [employer]    Authorization Time Period 05/10/24 to 07/05/24    PT Start Time 0847    PT Stop Time 0927    PT Time Calculation (min) 40 min    Activity Tolerance Patient tolerated treatment well    Behavior During Therapy [facility] for tasks assessed/performed                 Past Medical History:  Diagnosis Date   Arthritis    gout and osteoarthritis   Chicken pox    Chronic kidney disease    GERD (gastroesophageal reflux disease)    Gout 2022   Hypertension    Morbid obesity (HCC)    Pre-diabetes    Sleep apnea    uses CPAP   Past Surgical History:  Procedure Laterality Date   COLONOSCOPY W/ BIOPSIES     ESOPHAGOGASTRODUODENOSCOPY     x 3   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   Procedure: ARTHROPLASTY, HIP, TOTAL, ANTERIOR APPROACH;  Surgeon: Vernetta Lonni GRADE, MD;  Location: [facility];  Service: Orthopedics;  Laterality: Right;   Patient Active Problem List   Diagnosis Date Noted   Status post total replacement of right hip 04/25/2024   Unilateral primary osteoarthritis, right hip 04/01/2024   URI, acute 08/17/2023   Obesity (BMI 30-39.9) 08/04/2023   Type 2 diabetes mellitus with chronic kidney disease, without long-term current use of insulin (HCC) 08/04/2023   Traumatic complete tear of left rotator cuff 04/12/2023   Traumatic complete tear of right rotator cuff 04/12/2023   Lower respiratory infection 10/04/2022   Right Achilles tendinitis 06/03/2022   Chronic gout 03/30/2022   Diarrhea 12/16/2021   Stage 3a chronic kidney disease (HCC) 09/01/2021   Left elbow pain 10/18/2019   Acute cough 10/03/2019   Mixed hyperlipidemia 09/08/2017   Allergic rhinitis  10/11/2016   Chronic migraine without aura without status migrainosus, not intractable 01/14/2016   Resistant hypertension 01/04/2016   Generalized headache 12/16/2015   Colon cancer screening 09/06/2013   GERD (gastroesophageal reflux disease) 09/06/2013   Dysphagia 02/08/2011   OSA (obstructive sleep apnea) 12/28/2008   Severe obesity (BMI >= 40) (HCC) 10/07/2008    PCP: Lendia Nordmann NP-C   REFERRING PROVIDER: Vernetta Lonni GRADE, MD  REFERRING DIAG: Diagnosis [phone] (ICD-10-CM) - Status post right hip replacement  THERAPY DIAG:  Pain in right hip  Muscle weakness (generalized)  Difficulty in walking, not elsewhere classified  Unsteadiness on feet  Rationale for Evaluation and Treatment: Rehabilitation  ONSET DATE: R anterior THA surgery 04/25/24  SUBJECTIVE:   SUBJECTIVE STATEMENT: Patient reports soreness after last PT session. He feels his progress has plateau, but is ready to continue doing PT and HEP. Overall no new changes.   PERTINENT HISTORY: See above   PAIN:  NPRS scale: 0/10 Pain location: right surgical hip Pain description: general soreness  Aggravating factors: doing too much, some movements like stretches  Relieving factors: rest and ice   PRECAUTIONS: Anterior hip  RED FLAGS: None   WEIGHT BEARING RESTRICTIONS: No  FALLS:  Has patient fallen in last 6 months? 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 Occupation demands include stepping up and down from box truck with tires that weigh between10-20 pounds. PT educated patient on how to unload/load easier by putting tires at edge of truck, stepping out of truck, and then grabbing the tire.   PLOF: Independent, Independent with basic ADLs, Independent with gait, and Independent with transfers  PATIENT  GOALS: get stronger, get back to PLOF   NEXT MD VISIT: Referring 05/23/24  OBJECTIVE:  Note: Objective measures were completed at Evaluation unless otherwise noted.    PATIENT SURVEYS:   Patient-Specific Activity Scoring Scheme  0 represents "unable to perform." 10 represents "able to perform at prior level. 0 1 2 3 4 5 6 7 8 9  10 (Date and Score)   Activity Eval  05/10/2024 05/22/24   1. Tying shoes  1  7   2. Putting on socks  1   7  3. Walking up and down steps  4 7  4. Work tasks  3  5.    Score 2 6   Total score = sum of the activity scores/number of activities Minimum detectable change (90%CI) for average score = 2 points Minimum detectable change (90%CI) for single activity score = 3 points  COGNITION: 05/10/2024 Overall cognitive status: Within functional limits for tasks assessed     SENSATION: 05/10/2024 Some numbness at incision and where swelling is   EDEMA:  05/10/2024 As expected post-op   LOWER EXTREMITY ROM:      Right 05/14/2024  Hip flexion 95 PROM in supine   Hip extension   Hip abduction   Hip adduction   Hip internal rotation   Hip external rotation 30 PROM in supine 70 deg hip flexion   Knee flexion 26 PROM in supine 70 deg hip flexion  Knee extension   Ankle dorsiflexion   Ankle plantarflexion   Ankle inversion   Ankle eversion    (Blank rows = not tested)   LOWER EXTREMITY MMT:  MMT Right Eval 05/10/2024 Left Eval 05/10/2024  Hip flexion 3 pain limited  5  Hip extension    Hip abduction 3 slight pain 4  Hip adduction    Hip internal rotation    Hip external rotation    Knee flexion 4+ 4+  Knee extension 4 5  Ankle dorsiflexion    Ankle plantarflexion    Ankle inversion    Ankle eversion     (Blank rows = not tested)    FUNCTIONAL TESTS:  05/10/2024 5 times sit to stand: 25.5 seconds offloads surgical LE; 05/31/24 13 seconds no UEs no offshifting   Timed up and go (TUG): 23.5 seconds hurrycane; 05/31/24 8.6 seconds no  device   3 minute walk test: 310ft carried hurrycane but didn't use it   GAIT: 05/10/2024 Distance walked: 348ft Assistive device utilized: carried cane but did not use it  Level of assistance: Modified independence Comments: antalgic gait pattern, reduced gait speed                     TREATMENT         DATE:  06/11/2024 Therapeutic Exercise:   Recumbent bike seat 9 BLEs only, Level 5 for 8 minutes  Therapeutic Activities: Forward step ups and down in // bars with 8 inch box and no UE support, L and R LE leading alternating, 2x12 with no weight  and 1x12 with two 10# dumbbells. Patient demonstrated control on ascent and descent. PT demo and verbal cues when necessary.  Modified lunge with chair support, L and R LE leading alternating, 1x12 Chair squats with two 10# dumbbells, 1x10 and 2x7 Flight of 10 stairs up and down: 2x (1st step-to using RLE & 2nd rep alternating) with no weights, 2x alternating with two 10# dumbbells RLE stepping to 2nd step for greater range (simulating stepping into back of truck) then step ups so LLE to 4th step with R LE leading: 6x with BUE assist, 6x with LUE assist, 6x with RUE assist. Patient required verbal cues to use UE only for balance support and not pulling up          PT educated pt to do 5-10 minutes of exercise 1x every hour to loosen the hip and mimic occupation demands. Patient verbalized understanding       TREATMENT         DATE:  06/04/2024 Therapeutic Exercise: Recumbent bike seat 9 BLEs only, Level 4 for 8 minutes  Therapeutic Activities: Forward step ups and down in // bars, no UE support on step up, L UE support with step downs, down with L LE leading and up with R LE leading, 2x12 with 8 inch box. Patient demonstrated control on ascent and descent. PT demo and verbal cues when necessary.  Lateral step ups in // bars with 8 inch box. R LE leading lateral step up with L LE crossing in front and behind to touch corner of box, 2x12. UE finger  assist. Patient demonstrated control on ascent and crossing of L LE. Last 3 reps of first set, patient attempted to do exercise with no UE support, only minimal sway. PT demo and verbal cues when necessary.                                                                                                                              05/31/24   Scifit bike seat 13 full rotations x8 minutes at L4 (Nustep not available)  5xSTS, TUG   For functional strength and hip mobility:  Forward step ups 2x12 R (1st round with 6 inch box, 2nd round with 8 inch) Lateral step ups  2x12 R (1st round with 6 inch box, 2nd round with 8 inch) Forward step downs  2x12 R (1st round with 6 inch box, 2nd round with 8 inch) 3D hip excursions for hip mobility x20 each direction Squats in // bars self selected depth x12  Shuttle BLE press 100# 2x15  Shuttle single LE press 62# 2x15 B Hip hikes + swing x12 B     TREATMENT         DATE:  05/28/2024 Therapeutic Exercise: NuStep seat 14 BLEs only Level 8 for 8 minutes Gastroc stretch step heel depression 30 sec hold 3 reps Hamstring stretch foot on 2nd step 30 sec hold 3 reps Quad stretch standing with towel  to assist knee flexion 30 sec hold 3 reps  Therapeutic Activities: Standing in doorway with PT cues on positioning so that he does not have a lateral trunk lean. RLE abduction control with LLE hip hike. 10 reps hip hiking only, 2nd set of 10 reps to hip hiking and moving left lower extremity forward to height that he can still control right side pelvis from dropping. 3rd set of 10 reps to hip hiking and moving left lower extremity backward to height that he can still control right side pelvis from dropping. 4th set of 10 reps to hip hiking and moving left lower extremity abduction to height that he can still control right side pelvis from dropping. 5th set of 10 reps to hip hiking and moving left lower extremity adduction to height that he can still control right  side pelvis from dropping.Patient verbalized understanding of this as an HEP progression. Stairs: step-to pattern 2 rails BUE support 11 steps PT demo & verbal cues on technique to use RLE to lower & raise body weight.  Pt performed with supervision / cues to correct but no instability noted. Pt appears safe to perform at home. Side step up with BUEs on rail using RLE including active to step RLE on/off step, weight shift over RLE for step-up & step-down for 10 reps.  Pt appears safe to perform at home.   Forward Plank at sink 30 sec hold 3 reps.   PT updated HEP with demo, verbal & HO cues.  Pt verbalized understanding after performing during session.    PATIENT EDUCATION:  05/10/2024 Education details:  HEP update Person educated: Patient Education method: Explanation, Demonstration, and Handouts Education comprehension: verbalized understanding, returned demonstration, and verbal cues required  HOME EXERCISE PROGRAM: Access Code: [email or web address] URL: [email or web address] Date: 05/28/2024 Prepared by: Grayce Spatz  Exercises - Supine Single Knee to Chest Stretch  - 2-3 x daily - 7 x weekly - 1 sets - 5 reps - 20 seconds hold - Supine Figure 4 Piriformis Stretch  - 2-3 x daily - 7 x weekly - 1 sets - 5 reps - 20 seconds hold - Supine Bridge  - 1-2 x daily - 7 x weekly - 2 sets - 10 reps - 5 seconds hold - Supine Bridge with Resistance Band  - 1 x daily - 7 x weekly - 2 sets - 10 reps - 5 seconds hold - Supine Bridge with Leg Extension  - 1 x daily - 7 x weekly - 2 sets - 10 reps - 5 seconds hold - Clamshell (Mirrored)  - 1-2 x daily - 7 x weekly - 2 sets - 10-15 reps - 3 seconds hold - Seated Quad Set  - 3-5 x daily - 7 x weekly - 1 sets - 10 reps - 5 hold - Tandem Stance in Corner  - 1-2 x daily - 7 x weekly - 1 sets - 5 reps - 20-30 seconds hold - Standing Hip Hiking  - 2-3 x daily - 7 x weekly - 2 sets - 10 reps - 5-10 seconds hold - standing hip hike with leg kick 4  directions  - 1 x daily - 7 x weekly - 2 sets - 10 reps - 5 seconds hold - Gastroc Stretch on Step  - 1-2 x daily - 7 x weekly - 1 sets - 3 reps - 20-30 seconds hold - Standing Hamstring Stretch with Step  - 1-2 x daily - 7 x weekly - 1 sets - 3  reps - 20-30 seconds hold - Standing Quad Stretch with Towel and Arm Support  - 1-2 x daily - 7 x weekly - 1 sets - 3 reps - 20-30 seconds hold - Standing Plank on Wall  - 1 x daily - 7 x weekly - 1 sets - 3 reps - 30 seconds hold  ASSESSMENT:  CLINICAL IMPRESSION: Patient did well overall and was successful with exercise progression. Patient was able to complete 8 box step up/down without UE support, indicating progression in stability and strength. He demonstrated control with ascent and descent of all step up/down activities. Patient commented these exercises were more challenging and feel like progressions that will help achieve functional demands required for occupation. His fatigue during the session indicates a lack of endurance needed for his 8 hour work days. Patient will continue to benefit from skilled PT to reach his goals.   OBJECTIVE IMPAIRMENTS: Abnormal gait, decreased activity tolerance, decreased balance, decreased knowledge of use of DME, decreased mobility, difficulty walking, decreased ROM, decreased strength, impaired flexibility, impaired sensation, obesity, and pain.   ACTIVITY LIMITATIONS: carrying, lifting, sitting, standing, squatting, sleeping, stairs, transfers, and locomotion level  PARTICIPATION LIMITATIONS: driving, shopping, community activity, occupation, and yard work  PERSONAL FACTORS: Age, Fitness, Past/current experiences, and Time since onset of injury/illness/exacerbation are also affecting patient's functional outcome.   REHAB POTENTIAL: Excellent  CLINICAL DECISION MAKING: Stable/uncomplicated  EVALUATION COMPLEXITY: Low   GOALS: Goals reviewed with patient? Yes  SHORT TERM GOALS: Target date: 06/07/2024    Will be compliant with appropriate progressive HEP  Baseline: Goal status: Met 05/22/2024 for HEP to date  2.  No pain with car transfers or bed mobility  Baseline:  Goal status: MET 05/31/24  3.  Gait deviations to have normalized  Baseline:  Goal status: Ongoing   06/06/2024  4.  Will complete 5xSTS test in 17 seconds or less without off-shifting from surgical LE  Baseline:  Goal status: MET 05/31/24    LONG TERM GOALS: Target date: 07/05/2024  MMT to have improved by at least 1 grade in all weak groups  Baseline:  Goal status: Ongoing   06/06/2024  2.  Will complete TUG and 5XSTS in 12 seconds or less no device  Baseline:  Goal status: PARTIALLY MET 05/31/24 TUG 8.6 seconds, 5xSTS 13 seconds   3.  Will ambulate at least 575ft in 3WMT no device no gait deviations  Baseline:  Goal status:   Ongoing  06/06/2024  4.  Will be able to perform simulated work duties in clinic with good mechanics and no increased pain  Baseline:  Goal status: Ongoing  06/06/2024  5.  PSFS to have improved by at least 3 points  Baseline:  Goal status: Ongoing  06/06/2024     PLAN:  PT FREQUENCY: 2x/week  PT DURATION: 8 weeks  PLANNED INTERVENTIONS: 97750- Physical Performance Testing, 97110-Therapeutic exercises, 97530- Therapeutic activity, [id number]- Neuromuscular re-education, 97535- Self Care, 02859- Manual therapy, [id number]- Gait training, [phone]- Aquatic Therapy, 97016- Vasopneumatic device, and DME instructions  PLAN FOR NEXT SESSION:  Continue progression of strengthening activities for R hip. Activities that mimic functional demands such as picking up objects, carrying them, and squatting to place them down. Continue to work towards STGs and LTGs    Ismael Nap, Student-PT, DPT 06/11/2024, 10:01 AM   This entire session of physical therapy was performed under the direct supervision of PT signing evaluation /treatment. PT reviewed note and agrees.   Grayce Spatz, PT, DPT 06/11/2024, 12:44  PM

## 2024-06-11 ENCOUNTER — Ambulatory Visit (INDEPENDENT_AMBULATORY_CARE_PROVIDER_SITE_OTHER): Admitting: Physical Therapy

## 2024-06-11 ENCOUNTER — Telehealth: Payer: Self-pay | Admitting: Orthopaedic Surgery

## 2024-06-11 ENCOUNTER — Encounter: Payer: Self-pay | Admitting: Physical Therapy

## 2024-06-11 DIAGNOSIS — R262 Difficulty in walking, not elsewhere classified: Secondary | ICD-10-CM | POA: Diagnosis not present

## 2024-06-11 DIAGNOSIS — M6281 Muscle weakness (generalized): Secondary | ICD-10-CM

## 2024-06-11 DIAGNOSIS — R2681 Unsteadiness on feet: Secondary | ICD-10-CM

## 2024-06-11 DIAGNOSIS — M25551 Pain in right hip: Secondary | ICD-10-CM | POA: Diagnosis not present

## 2024-06-11 NOTE — Telephone Encounter (Signed)
 [place]  [employer] form completed is through 07/15/2024. I spoke with patient advised of this and I refaxed form reflecting this and reiterated on cover letter his estimated return to work is 07/15/24 which is 12 weeks post op.

## 2024-06-11 NOTE — Telephone Encounter (Signed)
 Patient called and said that his paperwork said he is to return to work on the 15th when he doesn't even have his post op til 16th and plus he is still doing PT. He would like for you to call him. CB#[phone]

## 2024-06-12 ENCOUNTER — Ambulatory Visit: Admitting: Family Medicine

## 2024-06-14 ENCOUNTER — Other Ambulatory Visit: Payer: Self-pay | Admitting: Family Medicine

## 2024-06-14 ENCOUNTER — Ambulatory Visit (INDEPENDENT_AMBULATORY_CARE_PROVIDER_SITE_OTHER): Admitting: Physical Therapy

## 2024-06-14 ENCOUNTER — Encounter: Payer: Self-pay | Admitting: Physical Therapy

## 2024-06-14 DIAGNOSIS — M6281 Muscle weakness (generalized): Secondary | ICD-10-CM | POA: Diagnosis not present

## 2024-06-14 DIAGNOSIS — M25551 Pain in right hip: Secondary | ICD-10-CM | POA: Diagnosis not present

## 2024-06-14 DIAGNOSIS — R262 Difficulty in walking, not elsewhere classified: Secondary | ICD-10-CM

## 2024-06-14 DIAGNOSIS — R2681 Unsteadiness on feet: Secondary | ICD-10-CM

## 2024-06-14 NOTE — Telephone Encounter (Unsigned)
 Copied from CRM [id number]. Topic: Clinical - Medication Refill >> Jun 14, 2024  3:44 PM [name] wrote: Medication: [email or web address]   Has the patient contacted their pharmacy? Yes (Agent: If no, request that the patient contact the pharmacy for the refill. If patient does not wish to contact the pharmacy document the reason why and proceed with request.) (Agent: If yes, when and what did the pharmacy advise?)  This is the patient's preferred pharmacy:  [id number] [place], [place] - [address] [place] [address] Phone: [phone] Fax: [phone]  Is this the correct pharmacy for this prescription? Yes If no, delete pharmacy and type the correct one.   Has the prescription been filled recently? No  Is the patient out of the medication? Yes  Has the patient been seen for an appointment in the last year OR does the patient have an upcoming appointment? Yes  Can we respond through MyChart? Yes  Agent: Please be advised that Rx refills may take up to 3 business days. We ask that you follow-up with your pharmacy.  CB [phone]

## 2024-06-14 NOTE — Therapy (Signed)
 OUTPATIENT PHYSICAL THERAPY TREATMENT   Patient Name: WEBB WEED MRN: [id number] DOB:05-11-1976, 48 y.o., male Today's Date: 06/14/2024  END OF SESSION:  PT End of Session - 06/14/24 0900     Visit Number 9    Number of Visits 17    Date for PT Re-Evaluation 07/05/24    Authorization Type [employer]    Authorization Time Period 05/10/24 to 07/05/24    PT Start Time 0851    PT Stop Time 0930    PT Time Calculation (min) 39 min    Activity Tolerance Patient tolerated treatment well    Behavior During Therapy [facility] for tasks assessed/performed                  Past Medical History:  Diagnosis Date   Arthritis    gout and osteoarthritis   Chicken pox    Chronic kidney disease    GERD (gastroesophageal reflux disease)    Gout 2022   Hypertension    Morbid obesity (HCC)    Pre-diabetes    Sleep apnea    uses CPAP   Past Surgical History:  Procedure Laterality Date   COLONOSCOPY W/ BIOPSIES     ESOPHAGOGASTRODUODENOSCOPY     x 3   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   Procedure: ARTHROPLASTY, HIP, TOTAL, ANTERIOR APPROACH;  Surgeon: Vernetta Lonni GRADE, MD;  Location: [facility];  Service: Orthopedics;  Laterality: Right;   Patient Active Problem List   Diagnosis Date Noted   Status post total replacement of right hip 04/25/2024   Unilateral primary osteoarthritis, right hip 04/01/2024   URI, acute 08/17/2023   Obesity (BMI 30-39.9) 08/04/2023   Type 2 diabetes mellitus with chronic kidney disease, without long-term current use of insulin (HCC) 08/04/2023   Traumatic complete tear of left rotator cuff 04/12/2023   Traumatic complete tear of right rotator cuff 04/12/2023   Lower respiratory infection 10/04/2022   Right Achilles tendinitis 06/03/2022   Chronic gout 03/30/2022   Diarrhea 12/16/2021   Stage 3a chronic kidney disease (HCC) 09/01/2021   Left elbow pain 10/18/2019   Acute cough 10/03/2019   Mixed hyperlipidemia 09/08/2017   Allergic rhinitis  10/11/2016   Chronic migraine without aura without status migrainosus, not intractable 01/14/2016   Resistant hypertension 01/04/2016   Generalized headache 12/16/2015   Colon cancer screening 09/06/2013   GERD (gastroesophageal reflux disease) 09/06/2013   Dysphagia 02/08/2011   OSA (obstructive sleep apnea) 12/28/2008   Severe obesity (BMI >= 40) (HCC) 10/07/2008    PCP: Lendia Nordmann NP-C   REFERRING PROVIDER: Vernetta Lonni GRADE, MD  REFERRING DIAG: Diagnosis [phone] (ICD-10-CM) - Status post right hip replacement  THERAPY DIAG:  Pain in right hip  Muscle weakness (generalized)  Difficulty in walking, not elsewhere classified  Unsteadiness on feet  Rationale for Evaluation and Treatment: Rehabilitation  ONSET DATE: R anterior THA surgery 04/25/24  SUBJECTIVE:   SUBJECTIVE STATEMENT:   Getting a little better and stronger, I had really bad soreness after carrying the weights up the stairs last session. Getting more active doing stuff around the house like cutting grass. More strenuous things really cause a lot of difficulty  PERTINENT HISTORY: See above   PAIN:  NPRS scale: 0/10 today  Pain location:  Pain description:  Aggravating factors:   Relieving factors:   PRECAUTIONS: Anterior hip  RED FLAGS: None   WEIGHT BEARING RESTRICTIONS: No  FALLS:  Has patient fallen in last 6 months? 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 Occupation demands include stepping up and down from box truck with tires that weigh between10-20 pounds. PT educated patient on how to unload/load easier by putting tires at edge of truck, stepping out of truck, and then grabbing the tire.   PLOF: Independent, Independent with basic ADLs, Independent with gait, and Independent with  transfers  PATIENT GOALS: get stronger, get back to PLOF   NEXT MD VISIT: Referring 05/23/24  OBJECTIVE:  Note: Objective measures were completed at Evaluation unless otherwise noted.    PATIENT SURVEYS:   Patient-Specific Activity Scoring Scheme  0 represents "unable to perform." 10 represents "able to perform at prior level. 0 1 2 3 4 5 6 7 8 9  10 (Date and Score)   Activity Eval  05/10/2024 05/22/24   1. Tying shoes  1  7   2. Putting on socks  1   7  3. Walking up and down steps  4 7  4. Work tasks  3  5.    Score 2 6   Total score = sum of the activity scores/number of activities Minimum detectable change (90%CI) for average score = 2 points Minimum detectable change (90%CI) for single activity score = 3 points  COGNITION: 05/10/2024 Overall cognitive status: Within functional limits for tasks assessed     SENSATION: 05/10/2024 Some numbness at incision and where swelling is   EDEMA:  05/10/2024 As expected post-op   LOWER EXTREMITY ROM:      Right 05/14/2024  Hip flexion 95 PROM in supine   Hip extension   Hip abduction   Hip adduction   Hip internal rotation   Hip external rotation 30 PROM in supine 70 deg hip flexion   Knee flexion 26 PROM in supine 70 deg hip flexion  Knee extension   Ankle dorsiflexion   Ankle plantarflexion   Ankle inversion   Ankle eversion    (Blank rows = not tested)   LOWER EXTREMITY MMT:  MMT Right Eval 05/10/2024 Left Eval 05/10/2024  Hip flexion 3 pain limited  5  Hip extension    Hip abduction 3 slight pain 4  Hip adduction    Hip internal rotation    Hip external rotation    Knee flexion 4+ 4+  Knee extension 4 5  Ankle dorsiflexion    Ankle plantarflexion    Ankle inversion    Ankle eversion     (Blank rows = not tested)    FUNCTIONAL TESTS:  05/10/2024 5 times sit to stand: 25.5 seconds offloads surgical LE; 05/31/24 13 seconds no UEs no offshifting   Timed up and go (TUG): 23.5 seconds hurrycane;  05/31/24 8.6 seconds no device   3 minute walk test: 363ft carried hurrycane but didn't use it   GAIT: 05/10/2024 Distance walked: 365ft Assistive device utilized: carried cane but did not use it  Level of assistance: Modified independence Comments: antalgic gait pattern, reduced gait speed                     TREATMENT         DATE:   06/14/24  Scifit L6x8 minutes seat 9 BLEs only   STS 20# chambered to shoulders x12 and OH hold farmers carry to leg press and back 3 rounds 20# KB squats down to tap top of 8 inch  box 3x15  Kneeling down to 8 inch box with blue foam pad (working towards floor to stand) 2x10 B TM belt pushes 2x10 B for hip extension strength  Staggered STS (working towards single STS) from high mat table x10 B     06/11/2024 Therapeutic Exercise:   Recumbent bike seat 9 BLEs only, Level 5 for 8 minutes  Therapeutic Activities: Forward step ups and down in // bars with 8 inch box and no UE support, L and R LE leading alternating, 2x12 with no weight and 1x12 with two 10# dumbbells. Patient demonstrated control on ascent and descent. PT demo and verbal cues when necessary.  Modified lunge with chair support, L and R LE leading alternating, 1x12 Chair squats with two 10# dumbbells, 1x10 and 2x7 Flight of 10 stairs up and down: 2x (1st step-to using RLE & 2nd rep alternating) with no weights, 2x alternating with two 10# dumbbells RLE stepping to 2nd step for greater range (simulating stepping into back of truck) then step ups so LLE to 4th step with R LE leading: 6x with BUE assist, 6x with LUE assist, 6x with RUE assist. Patient required verbal cues to use UE only for balance support and not pulling up          PT educated pt to do 5-10 minutes of exercise 1x every hour to loosen the hip and mimic occupation demands. Patient verbalized understanding       TREATMENT         DATE:  06/04/2024 Therapeutic Exercise: Recumbent bike seat 9 BLEs only, Level 4 for 8  minutes  Therapeutic Activities: Forward step ups and down in // bars, no UE support on step up, L UE support with step downs, down with L LE leading and up with R LE leading, 2x12 with 8 inch box. Patient demonstrated control on ascent and descent. PT demo and verbal cues when necessary.  Lateral step ups in // bars with 8 inch box. R LE leading lateral step up with L LE crossing in front and behind to touch corner of box, 2x12. UE finger assist. Patient demonstrated control on ascent and crossing of L LE. Last 3 reps of first set, patient attempted to do exercise with no UE support, only minimal sway. PT demo and verbal cues when necessary.                                                                                                                              05/31/24   Scifit bike seat 13 full rotations x8 minutes at L4 (Nustep not available)  5xSTS, TUG   For functional strength and hip mobility:  Forward step ups 2x12 R (1st round with 6 inch box, 2nd round with 8 inch) Lateral step ups  2x12 R (1st round with 6 inch box, 2nd round with 8 inch) Forward step downs  2x12 R (1st round with 6 inch box, 2nd round with 8 inch)  3D hip excursions for hip mobility x20 each direction Squats in // bars self selected depth x12  Shuttle BLE press 100# 2x15  Shuttle single LE press 62# 2x15 B Hip hikes + swing x12 B     TREATMENT         DATE:  05/28/2024 Therapeutic Exercise: NuStep seat 14 BLEs only Level 8 for 8 minutes Gastroc stretch step heel depression 30 sec hold 3 reps Hamstring stretch foot on 2nd step 30 sec hold 3 reps Quad stretch standing with towel to assist knee flexion 30 sec hold 3 reps  Therapeutic Activities: Standing in doorway with PT cues on positioning so that he does not have a lateral trunk lean. RLE abduction control with LLE hip hike. 10 reps hip hiking only, 2nd set of 10 reps to hip hiking and moving left lower extremity forward to height that he can still  control right side pelvis from dropping. 3rd set of 10 reps to hip hiking and moving left lower extremity backward to height that he can still control right side pelvis from dropping. 4th set of 10 reps to hip hiking and moving left lower extremity abduction to height that he can still control right side pelvis from dropping. 5th set of 10 reps to hip hiking and moving left lower extremity adduction to height that he can still control right side pelvis from dropping.Patient verbalized understanding of this as an HEP progression. Stairs: step-to pattern 2 rails BUE support 11 steps PT demo & verbal cues on technique to use RLE to lower & raise body weight.  Pt performed with supervision / cues to correct but no instability noted. Pt appears safe to perform at home. Side step up with BUEs on rail using RLE including active to step RLE on/off step, weight shift over RLE for step-up & step-down for 10 reps.  Pt appears safe to perform at home.   Forward Plank at sink 30 sec hold 3 reps.   PT updated HEP with demo, verbal & HO cues.  Pt verbalized understanding after performing during session.    PATIENT EDUCATION:  05/10/2024 Education details:  HEP update Person educated: Patient Education method: Explanation, Demonstration, and Handouts Education comprehension: verbalized understanding, returned demonstration, and verbal cues required  HOME EXERCISE PROGRAM: Access Code: [email or web address] URL: [email or web address] Date: 05/28/2024 Prepared by: Grayce Spatz  Exercises - Supine Single Knee to Chest Stretch  - 2-3 x daily - 7 x weekly - 1 sets - 5 reps - 20 seconds hold - Supine Figure 4 Piriformis Stretch  - 2-3 x daily - 7 x weekly - 1 sets - 5 reps - 20 seconds hold - Supine Bridge  - 1-2 x daily - 7 x weekly - 2 sets - 10 reps - 5 seconds hold - Supine Bridge with Resistance Band  - 1 x daily - 7 x weekly - 2 sets - 10 reps - 5 seconds hold - Supine Bridge with Leg Extension  - 1 x  daily - 7 x weekly - 2 sets - 10 reps - 5 seconds hold - Clamshell (Mirrored)  - 1-2 x daily - 7 x weekly - 2 sets - 10-15 reps - 3 seconds hold - Seated Quad Set  - 3-5 x daily - 7 x weekly - 1 sets - 10 reps - 5 hold - Tandem Stance in Corner  - 1-2 x daily - 7 x weekly - 1 sets - 5 reps - 20-30 seconds hold - Standing Hip Hiking  -  2-3 x daily - 7 x weekly - 2 sets - 10 reps - 5-10 seconds hold - standing hip hike with leg kick 4 directions  - 1 x daily - 7 x weekly - 2 sets - 10 reps - 5 seconds hold - Gastroc Stretch on Step  - 1-2 x daily - 7 x weekly - 1 sets - 3 reps - 20-30 seconds hold - Standing Hamstring Stretch with Step  - 1-2 x daily - 7 x weekly - 1 sets - 3 reps - 20-30 seconds hold - Standing Quad Stretch with Towel and Arm Support  - 1-2 x daily - 7 x weekly - 1 sets - 3 reps - 20-30 seconds hold - Standing Plank on Wall  - 1 x daily - 7 x weekly - 1 sets - 3 reps - 30 seconds hold  ASSESSMENT:  CLINICAL IMPRESSION:   Arrives today doing well, still feels really challenged by more strenuous tasks related to work simulation. Continued to challenge him as appropriate today while not pushing into extreme mm soreness. Doing well overall.   OBJECTIVE IMPAIRMENTS: Abnormal gait, decreased activity tolerance, decreased balance, decreased knowledge of use of DME, decreased mobility, difficulty walking, decreased ROM, decreased strength, impaired flexibility, impaired sensation, obesity, and pain.   ACTIVITY LIMITATIONS: carrying, lifting, sitting, standing, squatting, sleeping, stairs, transfers, and locomotion level  PARTICIPATION LIMITATIONS: driving, shopping, community activity, occupation, and yard work  PERSONAL FACTORS: Age, Fitness, Past/current experiences, and Time since onset of injury/illness/exacerbation are also affecting patient's functional outcome.   REHAB POTENTIAL: Excellent  CLINICAL DECISION MAKING: Stable/uncomplicated  EVALUATION COMPLEXITY:  Low   GOALS: Goals reviewed with patient? Yes  SHORT TERM GOALS: Target date: 06/07/2024   Will be compliant with appropriate progressive HEP  Baseline: Goal status: Met 05/22/2024 for HEP to date  2.  No pain with car transfers or bed mobility  Baseline:  Goal status: MET 05/31/24  3.  Gait deviations to have normalized  Baseline:  Goal status: Ongoing   06/06/2024  4.  Will complete 5xSTS test in 17 seconds or less without off-shifting from surgical LE  Baseline:  Goal status: MET 05/31/24    LONG TERM GOALS: Target date: 07/05/2024  MMT to have improved by at least 1 grade in all weak groups  Baseline:  Goal status: Ongoing   06/06/2024  2.  Will complete TUG and 5XSTS in 12 seconds or less no device  Baseline:  Goal status: PARTIALLY MET 05/31/24 TUG 8.6 seconds, 5xSTS 13 seconds   3.  Will ambulate at least 583ft in 3WMT no device no gait deviations  Baseline:  Goal status:   Ongoing  06/06/2024  4.  Will be able to perform simulated work duties in clinic with good mechanics and no increased pain  Baseline:  Goal status: Ongoing  06/06/2024  5.  PSFS to have improved by at least 3 points  Baseline:  Goal status: Ongoing  06/06/2024     PLAN:  PT FREQUENCY: 2x/week  PT DURATION: 8 weeks  PLANNED INTERVENTIONS: 97750- Physical Performance Testing, 97110-Therapeutic exercises, 97530- Therapeutic activity, [id number]- Neuromuscular re-education, 97535- Self Care, [id number]- Manual therapy, [id number]- Gait training, [phone]- Aquatic Therapy, [id number]- Vasopneumatic device, and DME instructions  PLAN FOR NEXT SESSION:  Continue progression of strengthening activities for R hip. Activities that mimic functional demands such as picking up objects, carrying them, and squatting to place them down.  Careful with L shoulder with exercise  progressions- hx of rotator cuff tear that  may need surgery at some point    Josette Rough, PT, DPT 06/14/24 9:30 AM

## 2024-06-17 ENCOUNTER — Ambulatory Visit: Admitting: Family Medicine

## 2024-06-17 NOTE — Therapy (Addendum)
 OUTPATIENT PHYSICAL THERAPY TREATMENT   Patient Name: Robert Cruz MRN: [id number] DOB:[birth year], 48 y.o., male Today's Date: 06/18/2024  PHYSICAL THERAPY DISCHARGE SUMMARY  Visits from Start of Care: 10  Current functional level related to goals / functional outcomes: See below.  MD saw patient on 06/19/2024 and released him to return to work.    Remaining deficits: See below   Education / Equipment: Patient was instructed in HEP which he appears to understand.    Patient agrees to discharge. Patient goals were partially met. Patient is being discharged due to the physician's request.   Grayce Spatz, PT, DPT 08/21/2024, 8:53 AM   END OF SESSION:  PT End of Session - 06/18/24 0854     Visit Number 10    Number of Visits 17    Date for PT Re-Evaluation 07/05/24    Authorization Type [employer]    Authorization Time Period 05/10/24 to 07/05/24    PT Start Time 0853    PT Stop Time 0932    PT Time Calculation (min) 39 min    Activity Tolerance Patient tolerated treatment well    Behavior During Therapy [facility] for tasks assessed/performed          Past Medical History:  Diagnosis Date   Arthritis    gout and osteoarthritis   Chicken pox    Chronic kidney disease    GERD (gastroesophageal reflux disease)    Gout 2022   Hypertension    Morbid obesity (HCC)    Pre-diabetes    Sleep apnea    uses CPAP   Past Surgical History:  Procedure Laterality Date   COLONOSCOPY W/ BIOPSIES     ESOPHAGOGASTRODUODENOSCOPY     x 3   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   Procedure: ARTHROPLASTY, HIP, TOTAL, ANTERIOR APPROACH;  Surgeon: Vernetta Lonni GRADE, MD;  Location: [facility];  Service: Orthopedics;  Laterality: Right;   Patient Active Problem List   Diagnosis Date Noted   Status post total replacement of right hip 04/25/2024   Unilateral primary osteoarthritis, right hip 04/01/2024   URI, acute 08/17/2023   Obesity (BMI 30-39.9) 08/04/2023   Type 2 diabetes  mellitus with chronic kidney disease, without long-term current use of insulin (HCC) 08/04/2023   Traumatic complete tear of left rotator cuff 04/12/2023   Traumatic complete tear of right rotator cuff 04/12/2023   Lower respiratory infection 10/04/2022   Right Achilles tendinitis 06/03/2022   Chronic gout 03/30/2022   Diarrhea 12/16/2021   Stage 3a chronic kidney disease (HCC) 09/01/2021   Left elbow pain 10/18/2019   Acute cough 10/03/2019   Mixed hyperlipidemia 09/08/2017   Allergic rhinitis 10/11/2016   Chronic migraine without aura without status migrainosus, not intractable 01/14/2016   Resistant hypertension 01/04/2016   Generalized headache 12/16/2015   Colon cancer screening 09/06/2013   GERD (gastroesophageal reflux disease) 09/06/2013   Dysphagia 02/08/2011   OSA (obstructive sleep apnea) 12/28/2008   Severe obesity (BMI >= 40) (HCC) 10/07/2008    PCP: Lendia Nordmann NP-C   REFERRING PROVIDER: Vernetta Lonni GRADE, MD  REFERRING DIAG: Diagnosis [phone] (ICD-10-CM) - Status post right hip replacement  THERAPY DIAG:  Pain in right hip  Muscle weakness (generalized)  Difficulty in walking, not elsewhere classified  Unsteadiness on feet  Rationale for Evaluation and Treatment: Rehabilitation  ONSET DATE: R anterior THA surgery 04/25/24  SUBJECTIVE:   SUBJECTIVE STATEMENT: Patient reports soreness in quads and lower part of the legs. He has been challenging himself on stairs  at home and is doing well. He has not been doing one exercise every hour but has been trying to get up more often to do exercise.   PERTINENT HISTORY: See above   PAIN:  NPRS scale: 0/10 today  Pain location:  Pain description:  Aggravating factors:   Relieving factors:   PRECAUTIONS: Anterior hip  RED FLAGS: None   WEIGHT BEARING RESTRICTIONS: No  FALLS:  Has patient fallen in last 6 months? 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 Occupation demands include stepping up and down from box truck with tires that weigh between10-20 pounds. PT educated patient on how to unload/load easier by putting tires at edge of truck, stepping out of truck, and then grabbing the tire.   PLOF: Independent, Independent with basic ADLs, Independent with gait, and Independent with transfers  PATIENT GOALS: get stronger, get back to PLOF   NEXT MD VISIT: Referring 05/23/24  OBJECTIVE:  Note: Objective measures were completed at Evaluation unless otherwise noted.    PATIENT SURVEYS:   Patient-Specific Activity Scoring Scheme  0 represents "unable to perform." 10 represents "able to perform at prior level. 0 1 2 3 4 5 6 7 8 9  10 (Date and Score)   Activity Eval  05/10/2024 05/22/24   1. Tying shoes  1  7   2. Putting on socks  1   7  3. Walking up and down steps  4 7  4. Work tasks  3  5.    Score 2 6   Total score = sum of the activity scores/number of activities Minimum detectable change (90%CI) for average score = 2 points Minimum detectable change (90%CI) for single activity score = 3 points  COGNITION: 05/10/2024 Overall cognitive status: Within functional limits for tasks assessed     SENSATION: 05/10/2024 Some numbness at incision and where swelling is   EDEMA:  05/10/2024 As expected post-op   LOWER EXTREMITY ROM:      Right 05/14/2024  Hip flexion 95 PROM in supine   Hip extension   Hip abduction   Hip adduction   Hip internal rotation   Hip external rotation 30 PROM in supine 70 deg hip flexion   Knee flexion 26 PROM in supine 70 deg hip flexion  Knee extension   Ankle dorsiflexion   Ankle plantarflexion   Ankle inversion   Ankle eversion    (Blank rows = not tested)   LOWER EXTREMITY MMT:  MMT Right Eval 05/10/2024 Left Eval 05/10/2024  Hip  flexion 3 pain limited  5  Hip extension    Hip abduction 3 slight pain 4  Hip adduction    Hip internal rotation    Hip external rotation    Knee flexion 4+ 4+  Knee extension 4 5  Ankle dorsiflexion    Ankle plantarflexion    Ankle inversion    Ankle eversion     (Blank rows = not tested)    FUNCTIONAL TESTS:  05/10/2024 5 times sit to stand: 25.5 seconds offloads surgical LE; 05/31/24 13 seconds no UEs no offshifting   Timed up and go (TUG): 23.5 seconds hurrycane; 05/31/24 8.6 seconds no device   3 minute walk test: 370ft carried hurrycane but didn't use it   GAIT: 05/10/2024 Distance walked: 356ft Assistive device utilized: carried cane but did not  use it  Level of assistance: Modified independence Comments: antalgic gait pattern, reduced gait speed                      TREATMENT         DATE:  06/18/2024  TherEx Recumbent bike, seat 9 BLEs only for 12 minutes. Subjective and PT demo proper lifting while on bike.   TherAct Squat, pick up 20# box, carry 10', squat to set down, 1x10 with high handles, 1x10 with low handles. PT demo and verbal educated about proper squatting mechanics: surgical leg slightly in front opposite leg, feet close to box, and lifting with legs instead of back. Patient verbalized and demo understanding.  Flight of stairs: Up skipping steps w/ R LE leading 1x, Up skipping steps alternating legs 2x, Up/down laterally 4x, Up/down lateral w/ crossing 4x  Resistive gait with Blue banded walk outs to work on pushing for power in multiple directions: forward, back, and laterally right/left. 5x each direction.  PT demo how to use standing door for HEP.  Patient verbalized understanding. PT continues to recommend patient stand and perform functional activities for 10 minutes of each hour for 8 to 10 hours/day to build up tolerance for return to work.  Patient verbalized understanding including recommendation to use timer on phone to help remind him.   TREATMENT          DATE:  06/14/24  Scifit L6x8 minutes seat 9 BLEs only   STS 20# chambered to shoulders x12 and OH hold farmers carry to leg press and back 3 rounds 20# KB squats down to tap top of 8 inch box 3x15  Kneeling down to 8 inch box with blue foam pad (working towards floor to stand) 2x10 B TM belt pushes 2x10 B for hip extension strength  Staggered STS (working towards single STS) from high mat table x10 B     06/11/2024 Therapeutic Exercise:   Recumbent bike seat 9 BLEs only, Level 5 for 8 minutes  Therapeutic Activities: Forward step ups and down in // bars with 8 inch box and no UE support, L and R LE leading alternating, 2x12 with no weight and 1x12 with two 10# dumbbells. Patient demonstrated control on ascent and descent. PT demo and verbal cues when necessary.  Modified lunge with chair support, L and R LE leading alternating, 1x12 Chair squats with two 10# dumbbells, 1x10 and 2x7 Flight of 10 stairs up and down: 2x (1st step-to using RLE & 2nd rep alternating) with no weights, 2x alternating with two 10# dumbbells RLE stepping to 2nd step for greater range (simulating stepping into back of truck) then step ups so LLE to 4th step with R LE leading: 6x with BUE assist, 6x with LUE assist, 6x with RUE assist. Patient required verbal cues to use UE only for balance support and not pulling up          PT educated pt to do 5-10 minutes of exercise 1x every hour to loosen the hip and mimic occupation demands. Patient verbalized understanding  PATIENT EDUCATION:  05/10/2024 Education details:  HEP update Person educated: Patient Education method: Explanation, Demonstration, and Handouts Education comprehension: verbalized understanding, returned demonstration, and verbal cues required  HOME EXERCISE PROGRAM: Access Code: [email or web address] URL:  [email or web address] Date: 05/28/2024 Prepared by: Grayce Spatz  Exercises - Supine Single Knee to Chest Stretch  - 2-3 x daily - 7 x weekly - 1 sets - 5 reps - 20 seconds hold - Supine Figure 4 Piriformis Stretch  - 2-3 x daily - 7 x weekly - 1 sets - 5 reps - 20 seconds hold - Supine Bridge  - 1-2 x daily - 7 x weekly - 2 sets - 10 reps - 5 seconds hold - Supine Bridge with Resistance Band  - 1 x daily - 7 x weekly - 2 sets - 10 reps - 5 seconds hold - Supine Bridge with Leg Extension  - 1 x daily - 7 x weekly - 2 sets - 10 reps - 5 seconds hold - Clamshell (Mirrored)  - 1-2 x daily - 7 x weekly - 2 sets - 10-15 reps - 3 seconds hold - Seated Quad Set  - 3-5 x daily - 7 x weekly - 1 sets - 10 reps - 5 hold - Tandem Stance in Corner  - 1-2 x daily - 7 x weekly - 1 sets - 5 reps - 20-30 seconds hold - Standing Hip Hiking  - 2-3 x daily - 7 x weekly - 2 sets - 10 reps - 5-10 seconds hold - standing hip hike with leg kick 4 directions  - 1 x daily - 7 x weekly - 2 sets - 10 reps - 5 seconds hold - Gastroc Stretch on Step  - 1-2 x daily - 7 x weekly - 1 sets - 3 reps - 20-30 seconds hold - Standing Hamstring Stretch with Step  - 1-2 x daily - 7 x weekly - 1 sets - 3 reps - 20-30 seconds hold - Standing Quad Stretch with Towel and Arm Support  - 1-2 x daily - 7 x weekly - 1 sets - 3 reps - 20-30 seconds hold - Standing Plank on Wall  - 1 x daily - 7 x weekly - 1 sets - 3 reps - 30 seconds hold  ASSESSMENT:  CLINICAL IMPRESSION:  Patient is overall doing well. Activities today were done to meet occupational demands. He responded well to instructions given for proper technique while squatting and lifting. Patient felt challenged by that activity and needed little to no reminders to keep proper form. Stair exercises continue to challenge his endurance, strength, and balance, and he has responded well to progressions. He will continue to benefit from skilled PT to meet occupational and  functional demands.   OBJECTIVE IMPAIRMENTS: Abnormal gait, decreased activity tolerance, decreased balance, decreased knowledge of use of DME, decreased mobility, difficulty walking, decreased ROM, decreased strength, impaired flexibility, impaired sensation, obesity, and pain.   ACTIVITY LIMITATIONS: carrying, lifting, sitting, standing, squatting, sleeping, stairs, transfers, and locomotion level  PARTICIPATION LIMITATIONS: driving, shopping, community activity, occupation, and yard work  PERSONAL FACTORS: Age, Fitness, Past/current experiences, and Time since onset of injury/illness/exacerbation are also affecting patient's functional outcome.   REHAB POTENTIAL: Excellent  CLINICAL DECISION MAKING: Stable/uncomplicated  EVALUATION COMPLEXITY: Low   GOALS: Goals reviewed with patient? Yes  SHORT TERM GOALS: Target date: 06/07/2024   Will be compliant with appropriate progressive HEP  Baseline: Goal status: Met 05/22/2024 for HEP to date  2.  No pain with  car transfers or bed mobility  Baseline:  Goal status: MET 05/31/24  3.  Gait deviations to have normalized  Baseline:  Goal status: Ongoing   06/06/2024  4.  Will complete 5xSTS test in 17 seconds or less without off-shifting from surgical LE  Baseline:  Goal status: MET 05/31/24    LONG TERM GOALS: Target date: 07/05/2024  MMT to have improved by at least 1 grade in all weak groups  Baseline:  Goal status: Ongoing   06/06/2024  2.  Will complete TUG and 5XSTS in 12 seconds or less no device  Baseline:  Goal status: PARTIALLY MET 05/31/24 TUG 8.6 seconds, 5xSTS 13 seconds   3.  Will ambulate at least 557ft in 3WMT no device no gait deviations  Baseline:  Goal status:   Ongoing  06/06/2024  4.  Will be able to perform simulated work duties in clinic with good mechanics and no increased pain  Baseline:  Goal status: Ongoing  06/06/2024  5.  PSFS to have improved by at least 3 points  Baseline:  Goal status: Ongoing   06/06/2024     PLAN:  PT FREQUENCY: 2x/week  PT DURATION: 8 weeks  PLANNED INTERVENTIONS: 97750- Physical Performance Testing, 97110-Therapeutic exercises, 97530- Therapeutic activity, [id number]- Neuromuscular re-education, 97535- Self Care, [id number]- Manual therapy, [id number]- Gait training, [phone]- Aquatic Therapy, 97016- Vasopneumatic device, and DME instructions  PLAN FOR NEXT SESSION:   Continue to progress squatting, lifting, and stair exercises to build up endurance and strength. Careful with L shoulder with exercise  progressions- hx of rotator cuff tear that may need surgery at some point   Ismael Theophilus Stallion, SPT 06/18/2024,  1:10 pm  This entire session of physical therapy was performed under the direct supervision of PT signing evaluation /treatment. PT reviewed note and agrees.   Grayce Spatz, PT, DPT 06/18/2024, 2:51 PM

## 2024-06-18 ENCOUNTER — Ambulatory Visit (INDEPENDENT_AMBULATORY_CARE_PROVIDER_SITE_OTHER): Admitting: Physical Therapy

## 2024-06-18 ENCOUNTER — Encounter: Payer: Self-pay | Admitting: Physical Therapy

## 2024-06-18 DIAGNOSIS — R262 Difficulty in walking, not elsewhere classified: Secondary | ICD-10-CM

## 2024-06-18 DIAGNOSIS — M25551 Pain in right hip: Secondary | ICD-10-CM | POA: Diagnosis not present

## 2024-06-18 DIAGNOSIS — R2681 Unsteadiness on feet: Secondary | ICD-10-CM

## 2024-06-18 DIAGNOSIS — M6281 Muscle weakness (generalized): Secondary | ICD-10-CM | POA: Diagnosis not present

## 2024-06-19 ENCOUNTER — Ambulatory Visit (INDEPENDENT_AMBULATORY_CARE_PROVIDER_SITE_OTHER): Admitting: Orthopaedic Surgery

## 2024-06-19 ENCOUNTER — Encounter: Payer: Self-pay | Admitting: Orthopaedic Surgery

## 2024-06-19 DIAGNOSIS — Z96641 Presence of right artificial hip joint: Secondary | ICD-10-CM

## 2024-06-19 NOTE — Progress Notes (Signed)
 The patient is a 48 year old gentleman who is getting close to 8 weeks status post a right total hip arthroplasty to treat severe right hip arthritis.  He is still in physical therapy working on range of motion and strengthening of his right hip.  On exam he still walks with a slight limp when he first gets up from sitting position.  There is some stiffness with internal and external rotation of his right hip and some pain.  It was raining yesterday so he was coming out of the store and tried to run a little bit and that caused significant hip discomfort and pain.  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  He needs to continue physical therapy on the right hip and give this a little bit more time.  He is very active and is very pleased thus far with how he is doing.  With that being said, we will release him to full work duties without restrictions but starting Monday, July 15, 2024.  I will give him time to continue to heal the hip and participate in therapy for his return to work.  We will see him back in 3 months with a standing AP pelvis and lateral of his right operative hip.

## 2024-06-20 ENCOUNTER — Encounter: Payer: Self-pay | Admitting: Family Medicine

## 2024-06-20 ENCOUNTER — Ambulatory Visit: Admitting: Family Medicine

## 2024-06-20 VITALS — BP 120/84 | HR 84 | Temp 97.8°F | Ht 74.0 in | Wt 289.0 lb

## 2024-06-20 DIAGNOSIS — E669 Obesity, unspecified: Secondary | ICD-10-CM

## 2024-06-20 DIAGNOSIS — E782 Mixed hyperlipidemia: Secondary | ICD-10-CM | POA: Diagnosis not present

## 2024-06-20 DIAGNOSIS — G4733 Obstructive sleep apnea (adult) (pediatric): Secondary | ICD-10-CM

## 2024-06-20 DIAGNOSIS — N1831 Chronic kidney disease, stage 3a: Secondary | ICD-10-CM

## 2024-06-20 DIAGNOSIS — E1122 Type 2 diabetes mellitus with diabetic chronic kidney disease: Secondary | ICD-10-CM

## 2024-06-20 DIAGNOSIS — Z7985 Long-term (current) use of injectable non-insulin antidiabetic drugs: Secondary | ICD-10-CM

## 2024-06-20 DIAGNOSIS — Z1159 Encounter for screening for other viral diseases: Secondary | ICD-10-CM

## 2024-06-20 MED ORDER — TIRZEPATIDE 10 MG/0.5ML ~~LOC~~ SOAJ: 10.0000 mg | SUBCUTANEOUS | 1 refills | Status: DC

## 2024-06-20 NOTE — Progress Notes (Addendum)
 Subjective:     Patient ID: Robert Cruz, male    DOB: [birth year], 48 y.o.   MRN: [id number]  Chief Complaint  Patient presents with   Follow-up    States doesn't feel like 7.5 mg working anymore, dieting and working out as well    HPI   History of Present Illness          He is here for follow-up on chronic health conditions including diabetes, hyperlipidemia, OSA and obesity.  I have not seen him since August 2024.  He has been taking Zepbound  7.5 mg for several months. He was losing weight and as of the past 3 weeks, he has noticed the medication not being as effective with appetite suppression.   Eating once daily during the week and more on the weekends.   Lost total of 80 lbs since being on the GLP-1   Eye doctor - 4-5 months ago [facility]   Under the care of Dr. Alveta cardiologist for hypertension.  States he takes his statin 3-4 days per week.   Drives a truck  Married  2 grandchildren   Health Maintenance Due  Topic Date Due   COVID-19 Vaccine (1) Never done   FOOT EXAM  Never done   OPHTHALMOLOGY EXAM  Never done   HIV Screening  Never done   Diabetic kidney evaluation - Urine ACR  Never done   Hepatitis C Screening  Never done   Pneumococcal Vaccine 25-53 Years old (1 of 2 - PCV) Never done   Hepatitis B Vaccines (1 of 3 - 19+ 3-dose series) Never done   HEMOGLOBIN A1C  12/07/2023    Past Medical History:  Diagnosis Date   Arthritis    gout and osteoarthritis   Chicken pox    Chronic kidney disease    GERD (gastroesophageal reflux disease)    Gout 2022   Hypertension    Morbid obesity (HCC)    Pre-diabetes    Sleep apnea    uses CPAP    Past Surgical History:  Procedure Laterality Date   COLONOSCOPY W/ BIOPSIES     ESOPHAGOGASTRODUODENOSCOPY     x 3   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   Procedure: ARTHROPLASTY, HIP, TOTAL, ANTERIOR APPROACH;  Surgeon: Vernetta Lonni GRADE, MD;  Location: [facility];  Service:  Orthopedics;  Laterality: Right;    Family History  Problem Relation Age of Onset   Hypertension Father    Depression Father    Hypertension Mother    Kidney disease Maternal Grandmother        Dialysis   Hypertension Maternal Grandmother    Healthy Brother        x1   Healthy Sister        x3   Colon cancer Neg Hx    Esophageal cancer Neg Hx    Rectal cancer Neg Hx    Stomach cancer Neg Hx     Social History   Socioeconomic History   Marital status: Married    Spouse name: Not on file   Number of children: 5   Years of education: 13   Highest education level: 12th grade  Occupational History   Occupation: Information systems manager: [employer]  Tobacco Use   Smoking status: Some Days    Types: Cigars   Smokeless tobacco: Never  Vaping Use   Vaping status: Never Used  Substance and Sexual Activity   Alcohol use: Not Currently    Comment:  seldom   Drug use: No   Sexual activity: Yes  Other Topics Concern   Not on file  Social History Narrative   Right handed   Two Story Home    Lives with wife and family   Drinks Caffeine everyday   Social Drivers of Health   Financial Resource Strain: Low Risk  (06/17/2024)   Overall Financial Resource Strain (CARDIA)    Difficulty of Paying Living Expenses: Not very hard  Food Insecurity: No Food Insecurity (06/17/2024)   Hunger Vital Sign    Worried About Running Out of Food in the Last Year: Never true    Ran Out of Food in the Last Year: Never true  Transportation Needs: No Transportation Needs (06/17/2024)   PRAPARE - Administrator, Civil Service (Medical): No    Lack of Transportation (Non-Medical): No  Physical Activity: Insufficiently Active (06/17/2024)   Exercise Vital Sign    Days of Exercise per Week: 3 days    Minutes of Exercise per Session: 40 min  Stress: No Stress Concern Present (06/17/2024)   [employer] of Occupational Health - Occupational Stress Questionnaire    Feeling of Stress:  Not at all  Social Connections: Moderately Isolated (06/17/2024)   Social Connection and Isolation Panel    Frequency of Communication with Friends and Family: Twice a week    Frequency of Social Gatherings with Friends and Family: Once a week    Attends Religious Services: Never    Database administrator or Organizations: No    Attends Engineer, structural: Not on file    Marital Status: Married  Catering manager Violence: Not At Risk (04/25/2024)   Humiliation, Afraid, Rape, and Kick questionnaire    Fear of Current or Ex-Partner: No    Emotionally Abused: No    Physically Abused: No    Sexually Abused: No    Outpatient Medications Prior to Visit  Medication Sig Dispense Refill   allopurinol  (ZYLOPRIM ) 100 MG tablet Take 200 mg by mouth in the morning.     alum hydroxide-mag trisilicate (GAVISCON) 80-20 MG CHEW chewable tablet Chew 2 tablets by mouth 4 (four) times daily as needed for indigestion or heartburn.     atenolol  (TENORMIN ) 100 MG tablet Take 1 tablet (100 mg total) by mouth daily. Must keep upcoming appt for refills 90 tablet 3   atorvastatin  (LIPITOR) 40 MG tablet Take 1 tablet (40 mg total) by mouth daily. 90 tablet 3   hydrochlorothiazide  (HYDRODIURIL ) 25 MG tablet Take 1 tablet (25 mg total) by mouth daily. 90 tablet 3   lisinopril  (ZESTRIL ) 20 MG tablet Take 1 tablet (20 mg total) by mouth daily. Must keep upcoming appt for refills 30 tablet 0   omeprazole -sodium bicarbonate  (ZEGERID ) 40-1100 MG capsule TAKE 1 CAPSULE BY MOUTH ONCE DAILY BEFORE BREAKFAST 30 capsule 3   spironolactone  (ALDACTONE ) 100 MG tablet TAKE 1 TABLET BY MOUTH EVERY DAY 90 tablet 3   tiZANidine  (ZANAFLEX ) 4 MG tablet Take 1 tablet (4 mg total) by mouth every 6 (six) hours as needed for muscle spasms. 30 tablet 1   tirzepatide  (MOUNJARO ) 7.5 MG/0.5ML Pen Inject 7.5 mg into the skin once a week. 6 mL 0   No facility-administered medications prior to visit.    Allergies  Allergen Reactions    Amlodipine  Other (See Comments)    edema   Bystolic  [Nebivolol  Hcl] Other (See Comments)    Patient stated medication gave him major headaches    Review  of Systems  Constitutional:  Negative for chills, fever and malaise/fatigue.  Respiratory:  Negative for shortness of breath.   Cardiovascular:  Negative for chest pain, palpitations and leg swelling.  Gastrointestinal:  Negative for abdominal pain, constipation, diarrhea, nausea and vomiting.  Genitourinary:  Negative for dysuria, frequency and urgency.  Neurological:  Negative for dizziness, focal weakness and headaches.       Objective:    Physical Exam Constitutional:      General: He is not in acute distress.    Appearance: He is obese. He is not ill-appearing.  HENT:     Mouth/Throat:     Mouth: Mucous membranes are moist.     Pharynx: Oropharynx is clear.  Eyes:     Extraocular Movements: Extraocular movements intact.     Conjunctiva/sclera: Conjunctivae normal.  Cardiovascular:     Rate and Rhythm: Normal rate and regular rhythm.  Pulmonary:     Effort: Pulmonary effort is normal.     Breath sounds: Normal breath sounds.  Musculoskeletal:     Cervical back: Normal range of motion and neck supple.     Right lower leg: No edema.     Left lower leg: No edema.  Skin:    General: Skin is warm and dry.  Neurological:     General: No focal deficit present.     Mental Status: He is alert and oriented to person, place, and time.     Motor: No weakness.     Coordination: Coordination normal.     Gait: Gait normal.  Psychiatric:        Mood and Affect: Mood normal.        Behavior: Behavior normal.        Thought Content: Thought content normal.      BP 120/84   Pulse 84   Temp 97.8 F (36.6 C) (Temporal)   Ht 6' 2 (1.88 m)   Wt 289 lb (131.1 kg)   SpO2 98%   BMI 37.11 kg/m  Wt Readings from Last 3 Encounters:  06/20/24 289 lb (131.1 kg)  05/08/24 277 lb (125.6 kg)  04/25/24 282 lb (127.9 kg)        Assessment & Plan:   Problem List Items Addressed This Visit     Mixed hyperlipidemia   Continue statin therapy and low fat diet. Increase dose to daily.       Obesity (BMI 30-39.9) - Primary   Significant weight loss until the past few weeks due to Mounjaro  7.5 mg effectiveness wearing off. Increase to 10 mg weekly. Continue to cut back on carbohydrates, sugar and foods high in fat. Get at least 150 minutes of exercise each week.       Relevant Medications   tirzepatide  (MOUNJARO ) 10 MG/0.5ML Pen   Other Relevant Orders   Hemoglobin A1c   TSH   CBC with Differential/Platelet   Comprehensive metabolic panel with GFR   OSA (obstructive sleep apnea)   Stage 3a chronic kidney disease (HCC)   Monitor renal function. Advised good control of HTN, DM and HLD to keep from worsening. On lisinopril .       Relevant Orders   CBC with Differential/Platelet   Type 2 diabetes mellitus with chronic kidney disease, without long-term current use of insulin (HCC)   Controlled as of July 2024. Check A1c. Increase Mounjaro  to 10 mg weekly Recommend annual eye exam.  Check urine microalbumin       Relevant Medications   tirzepatide  (MOUNJARO ) 10 MG/0.5ML  Pen   Other Relevant Orders   Hemoglobin A1c   TSH   Microalbumin / creatinine urine ratio   CBC with Differential/Platelet   Comprehensive metabolic panel with GFR   Other Visit Diagnoses       Encounter for screening for other viral diseases       Relevant Orders   Hepatitis C antibody   HIV Antibody (routine testing w rflx)       I have discontinued Alm T. Baiza's tirzepatide . I am also having him start on tirzepatide . Additionally, I am having him maintain his alum hydroxide-mag trisilicate, lisinopril , atenolol , atorvastatin , hydrochlorothiazide , allopurinol , spironolactone , omeprazole -sodium bicarbonate , and tiZANidine .  Meds ordered this encounter  Medications   tirzepatide  (MOUNJARO ) 10 MG/0.5ML Pen    Sig: Inject 10  mg into the skin once a week.    Dispense:  6 mL    Refill:  1    Supervising Provider:   ROLLENE NORRIS A [[id number]]

## 2024-06-20 NOTE — Assessment & Plan Note (Signed)
 Monitor renal function. Advised good control of HTN, DM and HLD to keep from worsening. On lisinopril .

## 2024-06-20 NOTE — Assessment & Plan Note (Addendum)
 Controlled as of July 2024. Check A1c. Increase Mounjaro  to 10 mg weekly Recommend annual eye exam.  Check urine microalbumin

## 2024-06-20 NOTE — Assessment & Plan Note (Signed)
 Continue statin therapy and low fat diet. Increase dose to daily.

## 2024-06-20 NOTE — Assessment & Plan Note (Signed)
 Significant weight loss until the past few weeks due to Mounjaro  7.5 mg effectiveness wearing off. Increase to 10 mg weekly. Continue to cut back on carbohydrates, sugar and foods high in fat. Get at least 150 minutes of exercise each week.

## 2024-06-21 ENCOUNTER — Encounter: Admitting: Physical Therapy

## 2024-06-21 LAB — COMPREHENSIVE METABOLIC PANEL WITH GFR
ALT: 11 U/L (ref 0–53)
AST: 18 U/L (ref 0–37)
Albumin: 4.2 g/dL (ref 3.5–5.2)
Alkaline Phosphatase: 77 U/L (ref 39–117)
BUN: 9 mg/dL (ref 6–23)
CO2: 24 meq/L (ref 19–32)
Calcium: 9.2 mg/dL (ref 8.4–10.5)
Chloride: 103 meq/L (ref 96–112)
Creatinine, Ser: 1.21 mg/dL (ref 0.40–1.50)
GFR: 70.83 mL/min (ref >60.00)
Glucose, Bld: 79 mg/dL (ref 70–99)
Potassium: 3.9 meq/L (ref 3.5–5.1)
Sodium: 137 meq/L (ref 135–145)
Total Bilirubin: 0.4 mg/dL (ref 0.2–1.2)
Total Protein: 7.2 g/dL (ref 6.0–8.3)

## 2024-06-21 LAB — MICROALBUMIN / CREATININE URINE RATIO
Creatinine,U: 233.9 mg/dL
Microalb Creat Ratio: 21.6 mg/g (ref 0.0–30.0)
Microalb, Ur: 5 mg/dL — ABNORMAL HIGH (ref 0.0–1.9)

## 2024-06-21 LAB — CBC WITH DIFFERENTIAL/PLATELET
Basophils Absolute: 0.1 K/uL (ref 0.0–0.1)
Basophils Relative: 0.8 % (ref 0.0–3.0)
Eosinophils Absolute: 0.5 K/uL (ref 0.0–0.7)
Eosinophils Relative: 6.1 % — ABNORMAL HIGH (ref 0.0–5.0)
HCT: 36.3 % — ABNORMAL LOW (ref 39.0–52.0)
Hemoglobin: 11.9 g/dL — ABNORMAL LOW (ref 13.0–17.0)
Lymphocytes Relative: 38.5 % (ref 12.0–46.0)
Lymphs Abs: 3 K/uL (ref 0.7–4.0)
MCHC: 32.7 g/dL (ref 30.0–36.0)
MCV: 83.4 fl (ref 78.0–100.0)
Monocytes Absolute: 0.5 K/uL (ref 0.1–1.0)
Monocytes Relative: 6.2 % (ref 3.0–12.0)
Neutro Abs: 3.8 K/uL (ref 1.4–7.7)
Neutrophils Relative %: 48.4 % (ref 43.0–77.0)
Platelets: 353 K/uL (ref 150.0–400.0)
RBC: 4.35 Mil/uL (ref 4.22–5.81)
RDW: 15.4 % (ref 11.5–15.5)
WBC: 7.8 K/uL (ref 4.0–10.5)

## 2024-06-21 LAB — HIV ANTIBODY (ROUTINE TESTING W REFLEX): HIV 1&2 Ab, 4th Generation: NONREACTIVE

## 2024-06-21 LAB — HEMOGLOBIN A1C: Hgb A1c MFr Bld: 5.5 % (ref 4.6–6.5)

## 2024-06-21 LAB — TSH: TSH: 1.13 u[IU]/mL (ref 0.35–5.50)

## 2024-06-21 LAB — HEPATITIS C ANTIBODY: Hepatitis C Ab: NONREACTIVE

## 2024-06-24 ENCOUNTER — Ambulatory Visit: Admitting: Family Medicine

## 2024-06-24 ENCOUNTER — Telehealth: Payer: Self-pay | Admitting: Orthopaedic Surgery

## 2024-06-24 ENCOUNTER — Ambulatory Visit: Payer: Self-pay | Admitting: Family Medicine

## 2024-06-24 NOTE — Telephone Encounter (Signed)
 [place]  [employer] form re-faxed. Initially faxed on 06/19/24.

## 2024-06-24 NOTE — Progress Notes (Signed)
 His labs show that he is anemic. Please ask him to follow up with me in the next 4-8 weeks to discuss this. His labs are ok otherwise.

## 2024-08-28 ENCOUNTER — Ambulatory Visit: Admitting: Family Medicine

## 2024-08-28 ENCOUNTER — Ambulatory Visit (INDEPENDENT_AMBULATORY_CARE_PROVIDER_SITE_OTHER)

## 2024-08-28 DIAGNOSIS — Z23 Encounter for immunization: Secondary | ICD-10-CM | POA: Diagnosis not present

## 2024-08-28 NOTE — Progress Notes (Signed)
Flu shot given w/o complications

## 2024-08-30 ENCOUNTER — Other Ambulatory Visit: Payer: Self-pay | Admitting: Gastroenterology

## 2024-09-10 ENCOUNTER — Ambulatory Visit: Admitting: Family Medicine

## 2024-09-10 NOTE — Progress Notes (Deleted)
   LILLETTE Ileana Collet, PhD, LAT, ATC acting as a scribe for Artist Lloyd, MD.  Robert Cruz is a 48 y.o. male who presents to [facility] today for exacerbation of his L shoulder pain. Pt was last seen for his shoulder on 12/22/23 and was given a L GH steroid injection.  Today, pt reports ***  Dx imaging: 07/17/22 L shoulder MRI             04/29/22 L shoulder XR  Pertinent review of systems: ***  Relevant historical information: ***   Exam:  There were no vitals taken for this visit. General: Well Developed, well nourished, and in no acute distress.   MSK: ***    Lab and Radiology Results No results found for this or any previous visit (from the past 72 hours). No results found.     Assessment and Plan: 48 y.o. male with ***   PDMP not reviewed this encounter. No orders of the defined types were placed in this encounter.  No orders of the defined types were placed in this encounter.    Discussed warning signs or symptoms. Please see discharge instructions. Patient expresses understanding.   ***

## 2024-09-20 ENCOUNTER — Encounter: Payer: Self-pay | Admitting: Family Medicine

## 2024-09-20 ENCOUNTER — Other Ambulatory Visit: Payer: Self-pay | Admitting: Family Medicine

## 2024-09-20 NOTE — Telephone Encounter (Unsigned)
 Copied from CRM [id number]. Topic: Clinical - Medication Refill >> Sep 20, 2024 12:35 PM Drema MATSU wrote: Medication: tirzepatide  (MOUNJARO ) 10 MG/0.5ML Pen  Has the patient contacted their pharmacy? Yes (Agent: If no, request that the patient contact the pharmacy for the refill. If patient does not wish to contact the pharmacy document the reason why and proceed with request.) stating that it is denied (Agent: If yes, when and what did the pharmacy advise?)  This is the patient's preferred pharmacy:  [id number] [place], [place] - [address] [place] [address] Phone: [phone] Fax: [phone]  Is this the correct pharmacy for this prescription? Yes If no, delete pharmacy and type the correct one.   Has the prescription been filled recently? Yes  Is the patient out of the medication? Yes  Has the patient been seen for an appointment in the last year OR does the patient have an upcoming appointment? Yes  Can we respond through MyChart? Yes  Agent: Please be advised that Rx refills may take up to 3 business days. We ask that you follow-up with your pharmacy.

## 2024-09-23 ENCOUNTER — Telehealth: Payer: Self-pay

## 2024-09-23 ENCOUNTER — Other Ambulatory Visit (HOSPITAL_COMMUNITY): Payer: Self-pay

## 2024-09-23 MED ORDER — TIRZEPATIDE 10 MG/0.5ML ~~LOC~~ SOAJ: 10.0000 mg | SUBCUTANEOUS | 1 refills | Status: DC

## 2024-09-23 NOTE — Telephone Encounter (Signed)
 Copied from CRM #[id number]. Topic: Clinical - Prescription Issue >> Sep 23, 2024 10:58 AM Jasmin G wrote: Reason for CRM: Pt states that he was told by his preferred pharmacy, his tirzepatide  (MOUNJARO ) 10 MG/0.5ML Pen could not be refilled due to missing approval from [employer] and he wanted to know why, I could not find info to relay. Please call pt back at [phone] to discuss.

## 2024-09-23 NOTE — Telephone Encounter (Signed)
 Pharmacy Patient Advocate Encounter   Received notification from Pt Calls Messages that prior authorization for Mounjaro  10mg /0.64ml is required/requested.   Insurance verification completed.   The patient is insured through [employer].   Per test claim: PA required; PA submitted to above mentioned insurance via Latent Key/confirmation #/EOC [facility] Status is pending

## 2024-09-23 NOTE — Telephone Encounter (Signed)
PA needed for [facility] 10mg .

## 2024-09-24 ENCOUNTER — Other Ambulatory Visit (HOSPITAL_COMMUNITY): Payer: Self-pay

## 2024-09-27 ENCOUNTER — Other Ambulatory Visit (HOSPITAL_COMMUNITY): Payer: Self-pay

## 2024-09-27 NOTE — Telephone Encounter (Signed)
 Placed a call to [employer] to check the status of the prior authorization. Per the representative, the PA is in clinical review and will let us  know once they reach a determination.   Phone# [phone]

## 2024-09-29 ENCOUNTER — Other Ambulatory Visit: Payer: Self-pay | Admitting: Family Medicine

## 2024-09-29 DIAGNOSIS — M1A0720 Idiopathic chronic gout, left ankle and foot, without tophus (tophi): Secondary | ICD-10-CM

## 2024-09-30 ENCOUNTER — Other Ambulatory Visit (HOSPITAL_COMMUNITY): Payer: Self-pay

## 2024-09-30 NOTE — Telephone Encounter (Signed)
 Allopurinol  refilled but patient has not had uric acid checked in over a year.  Uric acid lab ordered and note to pharmacy provided in the prescription.

## 2024-09-30 NOTE — Telephone Encounter (Signed)
 Called and notified pt

## 2024-09-30 NOTE — Telephone Encounter (Signed)
 Called and notified pt of approval, [facility] and copay $0

## 2024-09-30 NOTE — Telephone Encounter (Signed)
 Pharmacy Patient Advocate Encounter  Received notification from [employer] that Prior Authorization for Mounjaro  10mg /0.2ml has been APPROVED from 09/23/24 to 09/28/25. Ran test claim, Copay is $0. This test claim was processed through [facility]- copay amounts may vary at other pharmacies due to pharmacy/plan contracts, or as the patient moves through the different stages of their insurance plan.   PA #/Case ID/Reference #: [id number]  [facility].

## 2024-10-02 ENCOUNTER — Encounter: Payer: Self-pay | Admitting: Orthopaedic Surgery

## 2024-10-02 ENCOUNTER — Ambulatory Visit (INDEPENDENT_AMBULATORY_CARE_PROVIDER_SITE_OTHER): Admitting: Orthopaedic Surgery

## 2024-10-02 ENCOUNTER — Other Ambulatory Visit (INDEPENDENT_AMBULATORY_CARE_PROVIDER_SITE_OTHER): Payer: Self-pay

## 2024-10-02 DIAGNOSIS — Z96641 Presence of right artificial hip joint: Secondary | ICD-10-CM

## 2024-10-02 NOTE — Progress Notes (Signed)
 The patient is a 48 year old gentleman who is now almost 6 months status post a right total hip replacement to treat significant right hip pain and arthritis.  He says the range of motion is good with that hip and he is doing very well overall and has good strength.  He denies any left hip pain.  His right operative hip moves smoothly and fluidly as does his left hip.  An AP pelvis and lateral the right hip shows a well-seated right total hip arthroplasty with no complicating features.  There is arthritic changes in the left hip with superior lateral joint space narrowing and flattening of the femoral head but not to the degree of his right hip.  Thus far he is still asymptomatic with the left hip.  From our standpoint he will continue to increase his activities as comfort allows with no restrictions.  Will see him back in 6 months with a final standing AP pelvis.

## 2024-10-07 ENCOUNTER — Encounter: Payer: Self-pay | Admitting: Radiology

## 2024-10-22 ENCOUNTER — Other Ambulatory Visit: Payer: Self-pay | Admitting: Family Medicine

## 2024-10-28 ENCOUNTER — Other Ambulatory Visit: Payer: Self-pay | Admitting: Gastroenterology

## 2024-11-04 ENCOUNTER — Telehealth: Payer: Self-pay | Admitting: Gastroenterology

## 2024-11-04 MED ORDER — OMEPRAZOLE-SODIUM BICARBONATE 40-1100 MG PO CAPS: 1.0000 | ORAL_CAPSULE | Freq: Every day | ORAL | 1 refills | Status: DC

## 2024-11-04 NOTE — Telephone Encounter (Signed)
 Refill sent to pharmacy.

## 2024-11-04 NOTE — Telephone Encounter (Signed)
 Inbound call from patient stating he has an appointment on 12/12/2024 for medication refill but is in need of a refill now for medication omeprazole   Please advise  Thank you

## 2024-11-05 ENCOUNTER — Telehealth: Payer: Self-pay

## 2024-11-05 ENCOUNTER — Other Ambulatory Visit (HOSPITAL_COMMUNITY): Payer: Self-pay

## 2024-11-05 NOTE — Telephone Encounter (Signed)
 Pharmacy Patient Advocate Encounter   Received notification from CoverMyMeds that prior authorization for Omeprazole -Sodium Bicarbonate  40-1100MG  capsules is required/requested.   Insurance verification completed.   The patient is insured through [employer].   Per test claim: PA required; PA submitted to above mentioned insurance via Latent Key/confirmation #/EOC [id number] Status is pending

## 2024-11-08 NOTE — Telephone Encounter (Signed)
 Patient aware medication needing prior authorization.

## 2024-11-08 NOTE — Telephone Encounter (Signed)
 Request is still pending.

## 2024-11-08 NOTE — Telephone Encounter (Signed)
 Any update on medication? Patient is requesting update.

## 2024-11-08 NOTE — Telephone Encounter (Signed)
 Inbound call from patient requesting for us  to refill his Omeprazole  due to him stating that feel the effects of his acid reflux. Patient is requesting a call back. Please advise.

## 2024-11-08 NOTE — Telephone Encounter (Signed)
 Any status update? Patient is calling.

## 2024-11-11 ENCOUNTER — Other Ambulatory Visit: Payer: Self-pay

## 2024-11-11 ENCOUNTER — Ambulatory Visit: Admitting: Family Medicine

## 2024-11-11 ENCOUNTER — Other Ambulatory Visit (HOSPITAL_COMMUNITY): Payer: Self-pay

## 2024-11-11 VITALS — BP 128/80 | HR 84 | Ht 74.0 in | Wt 280.0 lb

## 2024-11-11 DIAGNOSIS — G8929 Other chronic pain: Secondary | ICD-10-CM

## 2024-11-11 DIAGNOSIS — M25512 Pain in left shoulder: Secondary | ICD-10-CM

## 2024-11-11 NOTE — Telephone Encounter (Signed)
 Pharmacy Patient Advocate Encounter  Received notification from [employer] that Prior Authorization for Omeprazole -Sodium Bicarbonate  40-1100MG  capsules has been APPROVED from 11-05-2024 to 11-10-2025. Ran test claim, Copay is $0.00. This test claim was processed through [facility]- copay amounts may vary at other pharmacies due to pharmacy/plan contracts, or as the patient moves through the different stages of their insurance plan.   PA #/Case ID/Reference #: [id number]

## 2024-11-11 NOTE — Patient Instructions (Signed)
 Thank you for coming in today.   You received an injection today. Seek immediate medical attention if the joint becomes red, extremely painful, or is oozing fluid.

## 2024-11-11 NOTE — Telephone Encounter (Signed)
 Patient aware Zegrid has been approved.

## 2024-11-11 NOTE — Progress Notes (Signed)
   Robert Ileana Collet, PhD, LAT, ATC acting as a scribe for Robert Lloyd, MD.  Robert Cruz is a 48 y.o. male who presents to [facility] at [facility] today for exacerbation of his L shoulder pain. Pt was last seen for his shoulder on 12/22/23 and was given a L GH steroid injection.  Today, pt reports L shoulder pain returned around Sept. He described the pain as different as the pain is not severe.   Dx imaging: 07/17/22 L shoulder MRI             04/29/22 L shoulder XR  Pertinent review of systems: No fevers or chills  Relevant historical information: Right hip replacement earlier this year.  Diabetes.  Left shoulder pain.   Exam:  BP 128/80   Pulse 84   Ht 6' 2 (1.88 m)   Wt 280 lb (127 kg)   SpO2 99%   BMI 35.95 kg/m  General: Well Developed, well nourished, and in no acute distress.   MSK: Left shoulder normal.  Normal motion some pain with abduction.  Intact strength to external and internal rotation.  Mild weakness 4/5 to abduction with pain.    Lab and Radiology Results  Procedure: Real-time Ultrasound Guided Injection of left shoulder subacromial bursa Device: Philips Affiniti 50G/GE Logiq Images permanently stored and available for review in PACS Verbal informed consent obtained.  Discussed risks and benefits of procedure. Warned about infection, bleeding, hyperglycemia damage to structures among others. Patient expresses understanding and agreement Time-out conducted.   Noted no overlying erythema, induration, or other signs of local infection.   Skin prepped in a sterile fashion.   Local anesthesia: Topical Ethyl chloride.   With sterile technique and under real time ultrasound guidance: 40 mg of Kenalog  and 2 mL of Marcaine  injected into subacromial bursa. Fluid seen entering the bursa.   Completed without difficulty   Pain immediately resolved suggesting accurate placement of the medication.   Advised to call if fevers/chills, erythema,  induration, drainage, or persistent bleeding.   Images permanently stored and available for review in the ultrasound unit.  Impression: Technically successful ultrasound guided injection.         Assessment and Plan: 48 y.o. male with left shoulder pain.  Acute exacerbation of a chronic problem due to rotator cuff tendinitis and bursitis.  Plan for injection and continue home exercise program check back as needed.   PDMP not reviewed this encounter. Orders Placed This Encounter  Procedures   US  LIMITED JOINT SPACE STRUCTURES UP LEFT(NO LINKED CHARGES)    Reason for Exam (SYMPTOM  OR DIAGNOSIS REQUIRED):   left shoulder pain    Preferred imaging location?:   [facility]   No orders of the defined types were placed in this encounter.    Discussed warning signs or symptoms. Please see discharge instructions. Patient expresses understanding.   The above documentation has been reviewed and is accurate and complete Robert Cruz, M.D.

## 2024-12-05 ENCOUNTER — Other Ambulatory Visit: Payer: Self-pay | Admitting: Gastroenterology

## 2024-12-12 ENCOUNTER — Ambulatory Visit (INDEPENDENT_AMBULATORY_CARE_PROVIDER_SITE_OTHER): Admitting: Gastroenterology

## 2024-12-12 ENCOUNTER — Encounter: Payer: Self-pay | Admitting: Gastroenterology

## 2024-12-12 VITALS — BP 110/74 | HR 114 | Ht 74.0 in | Wt 273.1 lb

## 2024-12-12 DIAGNOSIS — K449 Diaphragmatic hernia without obstruction or gangrene: Secondary | ICD-10-CM | POA: Insufficient documentation

## 2024-12-12 DIAGNOSIS — K219 Gastro-esophageal reflux disease without esophagitis: Secondary | ICD-10-CM | POA: Diagnosis not present

## 2024-12-12 MED ORDER — OMEPRAZOLE-SODIUM BICARBONATE 40-1100 MG PO CAPS: 1.0000 | ORAL_CAPSULE | Freq: Every day | ORAL | 3 refills | Status: AC

## 2024-12-12 NOTE — Patient Instructions (Addendum)
 We have sent the following medications to your pharmacy for you to pick up at your convenience: Zegrid  _______________________________________________________  If your blood pressure at your visit was 140/90 or greater, please contact your primary care physician to follow up on this.  _______________________________________________________  If you are age 49 or older, your body mass index should be between 23-30. Your Body mass index is 35.07 kg/m. If this is out of the aforementioned range listed, please consider follow up with your Primary Care Provider.  If you are age 34 or younger, your body mass index should be between 19-25. Your Body mass index is 35.07 kg/m. If this is out of the aformentioned range listed, please consider follow up with your Primary Care Provider.   ________________________________________________________  The [facility] providers would like to encourage you to use MYCHART to communicate with providers for non-urgent requests or questions.  Due to long hold times on the telephone, sending your provider a message by [facility] may be a faster and more efficient way to get a response.  Please allow 48 business hours for a response.  Please remember that this is for non-urgent requests.  _______________________________________________________  [facility] is using a team-based approach to care.  Your team is made up of your doctor and two to three APPS. Our APPS (Nurse Practitioners and Physician Assistants) work with your physician to ensure care continuity for you. They are fully qualified to address your health concerns and develop a treatment plan. They communicate directly with your gastroenterologist to care for you. Seeing the Advanced Practice Practitioners on your physician's team can help you by facilitating care more promptly, often allowing for earlier appointments, access to diagnostic testing, procedures, and other specialty referrals.

## 2024-12-12 NOTE — Progress Notes (Signed)
 "    12/12/2024 Robert Cruz [id number] 12/19/1975  Discussed the use of AI scribe software for clinical note transcription with the patient, who gave verbal consent to proceed.  History of Present Illness Robert Cruz is a 49 year old male with gastroesophageal reflux disease and obesity who presents for follow-up of acid reflux symptoms.  He is a patient of Dr. Clayburn.  Requests Zegerid  refill after being unable to obtain the medication for 1.5 to 2 weeks, during which time acid reflux symptoms flared severely, requiring frequent use of Gaviscon pills and liquid. Describes the flare as 'like a vengeance.' Symptoms have improved since resuming zegerid  less than a month ago. Reports that when taking zegerid , he is able to eat whatever he wants and does not experience significant reflux symptoms.  Reports significant weight loss from approximately 360 lbs to 270 lbs over the past year and a half, attributed to Mounjaro  and intermittent fasting. Has eliminated sweet tea, sweet juices, and sodas in favor of unsweetened beverages.  Has been taking Mounjaro  for weight management, which initially caused increased nausea and acid reflux, particularly with dose increases. Managed nausea with over-the-counter nausea gum and acid reflux with Gaviscon. These symptoms have since resolved, even with higher doses of Mounjaro .  Denies current reflux symptoms except during the recent period off omeprazole .  EGD 04/2023: - Normal larynx. - A 1- 2 cm sliding hiatal hernia. - Normal stomach. - Normal examined duodenum. - No specimens collected.   Past Medical History:  Diagnosis Date   Arthritis    gout and osteoarthritis   Chicken pox    Chronic kidney disease    GERD (gastroesophageal reflux disease)    Gout 2022   Hypertension    Morbid obesity (HCC)    Pre-diabetes    Sleep apnea    uses CPAP   Past Surgical History:  Procedure Laterality Date   COLONOSCOPY W/ BIOPSIES      ESOPHAGOGASTRODUODENOSCOPY     x 3   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   Procedure: ARTHROPLASTY, HIP, TOTAL, ANTERIOR APPROACH;  Surgeon: Vernetta Lonni GRADE, MD;  Location: [facility];  Service: Orthopedics;  Laterality: Right;    reports that he has been smoking cigars. He has never used smokeless tobacco. He reports that he does not currently use alcohol. He reports that he does not use drugs. family history includes Depression in his father; Healthy in his brother and sister; Hypertension in his father, maternal grandmother, and mother; Kidney disease in his maternal grandmother. Allergies[1]    Outpatient Encounter Medications as of 12/12/2024  Medication Sig   alum hydroxide-mag trisilicate (GAVISCON) 80-20 MG CHEW chewable tablet Chew 2 tablets by mouth 4 (four) times daily as needed for indigestion or heartburn.   atenolol  (TENORMIN ) 100 MG tablet Take 1 tablet (100 mg total) by mouth daily. Must keep upcoming appt for refills   atorvastatin  (LIPITOR) 40 MG tablet Take 1 tablet (40 mg total) by mouth daily.   hydrochlorothiazide  (HYDRODIURIL ) 25 MG tablet Take 1 tablet (25 mg total) by mouth daily.   lisinopril  (ZESTRIL ) 20 MG tablet Take 1 tablet (20 mg total) by mouth daily. Must keep upcoming appt for refills   omeprazole -sodium bicarbonate  (ZEGERID ) 40-1100 MG capsule Take 1 capsule by mouth daily before breakfast.   spironolactone  (ALDACTONE ) 100 MG tablet TAKE 1 TABLET BY MOUTH EVERY DAY   tirzepatide  (MOUNJARO ) 10 MG/0.5ML Pen Inject 10 mg into the skin once a week.   [DISCONTINUED] tiZANidine  (ZANAFLEX ) 4  MG tablet Take 1 tablet (4 mg total) by mouth every 6 (six) hours as needed for muscle spasms.   allopurinol  (ZYLOPRIM ) 100 MG tablet TAKE 2 TABLETS (200 MG TOTAL) BY MOUTH DAILY. GET URIC ACID CHECKED AT DR COREY'S OFFICE. ORDER IN NO OFFICE VISIT NEEDED. (Patient not taking: Reported on 12/12/2024)   No facility-administered encounter medications on file as of 12/12/2024.     REVIEW OF SYSTEMS  : All other systems reviewed and negative except where noted in the History of Present Illness.   PHYSICAL EXAM: BP 110/74   Pulse (!) 114   Ht 6' 2 (1.88 m)   Wt 273 lb 2 oz (123.9 kg)   BMI 35.07 kg/m  General: Well developed AA male in no acute distress Head: Normocephalic and atraumatic Eyes:  Sclerae anicteric, conjunctiva pink. Ears: Normal auditory acuity Lungs: Clear throughout to auscultation; no W/R/R. Heart: Tachy but regular rhythm.  No murmurs. Musculoskeletal: Symmetrical with no gross deformities  Skin: No lesions on visible extremities Neurological: Alert oriented x 4, grossly non-focal Psychological:  Alert and cooperative. Normal mood and affect  Assessment & Plan Gastroesophageal reflux disease Chronic gastroesophageal reflux disease previously well controlled on Zegerid , with recent symptom exacerbation during a lapse in therapy. Symptoms have improved and are currently well controlled since resuming Zegerid . Significant weight loss likely contributes to improved long-term management. Recent endoscopy was unremarkable; no further endoscopic evaluation is indicated at this time. - Refilled Zegerid  as a 90-day supply and sent prescription to [facility]. - Follow up in 12-18 months for further refills or sooner if needed.   CC:  Henson, Vickie L, NP-C       [1]  Allergies Allergen Reactions   Amlodipine  Other (See Comments)    edema   Bystolic  [Nebivolol  Hcl] Other (See Comments)    Patient stated medication gave him major headaches   "

## 2024-12-13 NOTE — Progress Notes (Signed)
 ____________________________________________________________  Attending physician addendum:  Thank you for sending this case to me. I have reviewed the entire note and agree with the plan.   Victory Brand, MD  ____________________________________________________________

## 2024-12-14 ENCOUNTER — Encounter: Payer: Self-pay | Admitting: Family Medicine

## 2024-12-17 ENCOUNTER — Other Ambulatory Visit: Payer: Self-pay | Admitting: Family Medicine

## 2024-12-25 ENCOUNTER — Encounter: Payer: Self-pay | Admitting: Family Medicine

## 2024-12-25 ENCOUNTER — Ambulatory Visit (INDEPENDENT_AMBULATORY_CARE_PROVIDER_SITE_OTHER): Admitting: Family Medicine

## 2024-12-25 VITALS — BP 120/76 | HR 88 | Temp 97.6°F | Ht 74.0 in | Wt 272.0 lb

## 2024-12-25 DIAGNOSIS — Z7985 Long-term (current) use of injectable non-insulin antidiabetic drugs: Secondary | ICD-10-CM

## 2024-12-25 DIAGNOSIS — E1122 Type 2 diabetes mellitus with diabetic chronic kidney disease: Secondary | ICD-10-CM | POA: Diagnosis not present

## 2024-12-25 DIAGNOSIS — E782 Mixed hyperlipidemia: Secondary | ICD-10-CM

## 2024-12-25 DIAGNOSIS — D649 Anemia, unspecified: Secondary | ICD-10-CM | POA: Diagnosis not present

## 2024-12-25 DIAGNOSIS — M1A3720 Chronic gout due to renal impairment, left ankle and foot, without tophus (tophi): Secondary | ICD-10-CM | POA: Diagnosis not present

## 2024-12-25 DIAGNOSIS — Z6834 Body mass index (BMI) 34.0-34.9, adult: Secondary | ICD-10-CM | POA: Diagnosis not present

## 2024-12-25 DIAGNOSIS — E669 Obesity, unspecified: Secondary | ICD-10-CM | POA: Diagnosis not present

## 2024-12-25 DIAGNOSIS — G4733 Obstructive sleep apnea (adult) (pediatric): Secondary | ICD-10-CM

## 2024-12-25 MED ORDER — TIRZEPATIDE 12.5 MG/0.5ML ~~LOC~~ SOAJ: 12.5000 mg | SUBCUTANEOUS | 1 refills | Status: AC

## 2024-12-25 NOTE — Progress Notes (Signed)
 "  Subjective:     Patient ID: Robert Cruz, male    DOB: 02/18/1976, 49 y.o.   MRN: [id number]  Chief Complaint  Patient presents with   Medical Management of Chronic Issues    Would like to discuss increasing mounjaro      HPI  Discussed the use of AI scribe software for clinical note transcription with the patient, who gave verbal consent to proceed.  History of Present Illness Robert Cruz is a 49 year old male who presents for follow-up on chronic health conditions.  Obesity and weight management - Lost approximately 100 pounds with Mounjaro  therapy - Current BMI is 34.9 - Weight loss has plateaued - Currently fasting, which he feels helps with weight loss  Type 2 diabetes mellitus - A1c improved from 6.6% three years ago to 5.5% in July 2025 - No recent symptoms of hyperglycemia or hypoglycemia reported  Hyperlipidemia - Takes atorvastatin  for lipid management - No reported adverse effects from statin therapy  Hypertension - Blood pressures remain within goal - Managed by cardiology - Takes atenolol , hydrochlorothiazide , spironolactone , and lisinopril   Anemia - Mild anemia with hemoglobin 11.9 in July 2025 - Concern for low iron intake - No known blood loss - Rare NSAID use - No blood donation history  Colorectal and upper gastrointestinal findings - Colonoscopy in May 2024 showed two diminutive rectal and hemicolon polyps, both removed - Upper endoscopy for reflux showed no esophagitis, stricture, or Barrett's esophagus  Gout - No recent gout flares - Uses allopurinol  intermittently, often before eating shrimp  Obstructive sleep apnea - Uses CPAP and is satisfied with therapy  Gastroesophageal reflux disease (gerd) - Takes omeprazole  for reflux - Occasionally uses Gaviscon - No esophagitis, stricture, or Barrett's esophagus on recent endoscopy     Health Maintenance Due  Topic Date Due   FOOT EXAM  Never done   OPHTHALMOLOGY EXAM  Never  done   Pneumococcal Vaccine (1 of 2 - PCV) Never done   Hepatitis B Vaccines 19-59 Average Risk (1 of 3 - 19+ 3-dose series) Never done   Diabetic kidney evaluation - Urine ACR  12/21/2024   HEMOGLOBIN A1C  12/21/2024    Past Medical History:  Diagnosis Date   Abnormal results of kidney function studies 10/05/2020   Anemia 10/05/2020   Arthritis    gout and osteoarthritis   Chicken pox    Chronic kidney disease    GERD (gastroesophageal reflux disease)    Gout 2022   Hypertension    Left upper quadrant pain 10/05/2020   Leukocytosis 10/05/2020   Morbid obesity (HCC)    Pre-diabetes    Sleep apnea    uses CPAP    Past Surgical History:  Procedure Laterality Date   COLONOSCOPY W/ BIOPSIES     ESOPHAGOGASTRODUODENOSCOPY     x 3   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   Procedure: ARTHROPLASTY, HIP, TOTAL, ANTERIOR APPROACH;  Surgeon: Vernetta Lonni GRADE, MD;  Location: [facility];  Service: Orthopedics;  Laterality: Right;    Family History  Problem Relation Age of Onset   Hypertension Father    Depression Father    Hypertension Mother    Kidney disease Maternal Grandmother        Dialysis   Hypertension Maternal Grandmother    Healthy Brother        x1   Healthy Sister        x3   Colon cancer Neg Hx    Esophageal cancer Neg  Hx    Rectal cancer Neg Hx    Stomach cancer Neg Hx     Social History   Socioeconomic History   Marital status: Married    Spouse name: Not on file   Number of children: 5   Years of education: 13   Highest education level: 12th grade  Occupational History   Occupation: Information Systems Manager: [employer]  Tobacco Use   Smoking status: Some Days    Types: Cigars   Smokeless tobacco: Never  Vaping Use   Vaping status: Never Used  Substance and Sexual Activity   Alcohol use: Not Currently    Comment: seldom   Drug use: No   Sexual activity: Yes  Other Topics Concern   Not on file  Social History Narrative   Right handed    Two Story Home    Lives with wife and family   Drinks Caffeine everyday   Social Drivers of Health   Tobacco Use: High Risk (12/25/2024)   Patient History    Smoking Tobacco Use: Some Days    Smokeless Tobacco Use: Never    Passive Exposure: Not on file  Financial Resource Strain: Low Risk (06/17/2024)   Overall Financial Resource Strain (CARDIA)    Difficulty of Paying Living Expenses: Not very hard  Food Insecurity: No Food Insecurity (06/17/2024)   Epic    Worried About Programme Researcher, Broadcasting/film/video in the Last Year: Never true    Ran Out of Food in the Last Year: Never true  Transportation Needs: No Transportation Needs (06/17/2024)   Epic    Lack of Transportation (Medical): No    Lack of Transportation (Non-Medical): No  Physical Activity: Insufficiently Active (06/17/2024)   Exercise Vital Sign    Days of Exercise per Week: 3 days    Minutes of Exercise per Session: 40 min  Stress: No Stress Concern Present (06/17/2024)   [employer] of Occupational Health - Occupational Stress Questionnaire    Feeling of Stress: Not at all  Social Connections: Moderately Isolated (06/17/2024)   Social Connection and Isolation Panel    Frequency of Communication with Friends and Family: Twice a week    Frequency of Social Gatherings with Friends and Family: Once a week    Attends Religious Services: Never    Database Administrator or Organizations: No    Attends Engineer, Structural: Not on file    Marital Status: Married  Catering Manager Violence: Not At Risk (04/25/2024)   Humiliation, Afraid, Rape, and Kick questionnaire    Fear of Current or Ex-Partner: No    Emotionally Abused: No    Physically Abused: No    Sexually Abused: No  Depression (PHQ2-9): Low Risk (12/25/2024)   Depression (PHQ2-9)    PHQ-2 Score: 0  Alcohol Screen: Not on file  Housing: Low Risk (06/17/2024)   Epic    Unable to Pay for Housing in the Last Year: No    Number of Times Moved in the Last Year: 0     Homeless in the Last Year: No  Utilities: Not At Risk (04/25/2024)   [facility] Utilities    Threatened with loss of utilities: No  Health Literacy: Not on file    Outpatient Medications Prior to Visit  Medication Sig Dispense Refill   allopurinol  (ZYLOPRIM ) 100 MG tablet TAKE 2 TABLETS (200 MG TOTAL) BY MOUTH DAILY. GET URIC ACID CHECKED AT DR COREY'S OFFICE. ORDER IN NO OFFICE VISIT NEEDED. 180 tablet 0  alum hydroxide-mag trisilicate (GAVISCON) 80-20 MG CHEW chewable tablet Chew 2 tablets by mouth 4 (four) times daily as needed for indigestion or heartburn.     atenolol  (TENORMIN ) 100 MG tablet Take 1 tablet (100 mg total) by mouth daily. Must keep upcoming appt for refills 90 tablet 3   atorvastatin  (LIPITOR) 40 MG tablet Take 1 tablet (40 mg total) by mouth daily. 90 tablet 3   hydrochlorothiazide  (HYDRODIURIL ) 25 MG tablet Take 1 tablet (25 mg total) by mouth daily. 90 tablet 3   lisinopril  (ZESTRIL ) 20 MG tablet Take 1 tablet (20 mg total) by mouth daily. Must keep upcoming appt for refills 30 tablet 0   omeprazole -sodium bicarbonate  (ZEGERID ) 40-1100 MG capsule Take 1 capsule by mouth daily before breakfast. 90 capsule 3   spironolactone  (ALDACTONE ) 100 MG tablet TAKE 1 TABLET BY MOUTH EVERY DAY 90 tablet 3   tirzepatide  (MOUNJARO ) 10 MG/0.5ML Pen Inject 10 mg into the skin once a week. 6 mL 1   No facility-administered medications prior to visit.    Allergies[1]  Review of Systems  Constitutional:  Positive for weight loss. Negative for chills, fever and malaise/fatigue.  Respiratory:  Negative for shortness of breath.   Cardiovascular:  Negative for chest pain, palpitations and leg swelling.  Gastrointestinal:  Negative for abdominal pain, constipation, diarrhea, nausea and vomiting.  Genitourinary:  Negative for dysuria, frequency and urgency.  Neurological:  Negative for dizziness and headaches.  Psychiatric/Behavioral:  Negative for depression. The patient is not  nervous/anxious.        Objective:    Physical Exam Constitutional:      General: He is not in acute distress.    Appearance: He is not ill-appearing.  HENT:     Mouth/Throat:     Mouth: Mucous membranes are moist.     Pharynx: Oropharynx is clear.  Eyes:     Extraocular Movements: Extraocular movements intact.     Conjunctiva/sclera: Conjunctivae normal.  Cardiovascular:     Rate and Rhythm: Normal rate.  Pulmonary:     Effort: Pulmonary effort is normal.  Musculoskeletal:        General: Normal range of motion.     Cervical back: Normal range of motion and neck supple.     Right lower leg: No edema.     Left lower leg: No edema.  Skin:    General: Skin is warm and dry.  Neurological:     General: No focal deficit present.     Mental Status: He is alert and oriented to person, place, and time.     Motor: No weakness.     Coordination: Coordination normal.     Gait: Gait normal.  Psychiatric:        Mood and Affect: Mood normal.        Behavior: Behavior normal.        Thought Content: Thought content normal.      BP 120/76   Pulse 88   Temp 97.6 F (36.4 C) (Temporal)   Ht 6' 2 (1.88 m)   Wt 272 lb (123.4 kg)   SpO2 99%   BMI 34.92 kg/m  Wt Readings from Last 3 Encounters:  12/25/24 272 lb (123.4 kg)  12/12/24 273 lb 2 oz (123.9 kg)  11/11/24 280 lb (127 kg)       Assessment & Plan:   Problem List Items Addressed This Visit     Chronic gout   Relevant Orders   Uric acid   Mixed  hyperlipidemia   Relevant Orders   Lipid panel   Obesity (BMI 30-39.9)   Relevant Medications   tirzepatide  (MOUNJARO ) 12.5 MG/0.5ML Pen   Other Relevant Orders   TSH   OSA (obstructive sleep apnea)   Type 2 diabetes mellitus with chronic kidney disease, without long-term current use of insulin (HCC)   Relevant Medications   tirzepatide  (MOUNJARO ) 12.5 MG/0.5ML Pen   Other Relevant Orders   TSH   Hemoglobin A1c   Other Visit Diagnoses       Mild anemia    -   Primary   Relevant Orders   CBC with Differential/Platelet   Comprehensive metabolic panel with GFR   Ferritin   Folate   Vitamin B12       Assessment and Plan Assessment & Plan Type 2 diabetes mellitus with chronic kidney disease Type 2 diabetes is well-controlled with an A1c of 6.6%. Chronic kidney disease is being monitored. He has lost approximately 100 pounds, contributing to improved glycemic control. - Check A1c and renal function  - increase Mounjaro  dose  Obesity BMI is 34.9. He has lost approximately 100 pounds and is motivated to reach a target weight of 220-230 pounds. He is on GLP-1 therapy (Mounjaro ) and has hit a weight loss plateau. - Increased Mounjaro  dosage to overcome weight loss plateau  Hypertension Blood pressure is well-controlled at 120/76 mmHg. He is on multiple antihypertensive medications including atenolol , hydrochlorothiazide , spironolactone , and lisinopril . - Continue current antihypertensive regimen - Follow with cardiology   Mixed hyperlipidemia He is on atorvastatin  for cholesterol management. - Continue atorvastatin  therapy  Obstructive sleep apnea He uses a CPAP machine regularly and reports satisfaction with its use. - Continue CPAP therapy  Chronic gout due to renal impairment He has chronic gout and is on allopurinol , which he takes inconsistently. He is aware that allopurinol  should be taken daily to manage uric acid levels. Hydrochlorothiazide  may increase the risk of gout attacks. - Educated on the importance of daily allopurinol  use - Checked uric acid level off allopurinol   Anemia Mild anemia with hemoglobin at 11.9 g/dL. The cause is unclear, but possible causes include low iron intake or chronic blood loss. He has low iron levels and a history of mild anemia since 2020. He is asymptomatic with no dizziness, fatigue, or shortness of breath. Recent EGD and colonoscopy showed no concerning findings. - Ordered iron, folate, and B12  levels - Will consider referral to hematology   Gastroesophageal reflux disease He manages GERD with omeprazole  and occasional Gaviscon. Recent upper endoscopy showed no esophagitis, stricture, or Barrett's esophagus. - Continue omeprazole  therapy  General health maintenance He is up to date on colonoscopy and upper endoscopy. He is fasting for lab work and is aware of the need for regular follow-up with cardiology. - Follow up with cardiology      I have discontinued Alm T. Coole's tirzepatide . I am also having him start on tirzepatide . Additionally, I am having him maintain his alum hydroxide-mag trisilicate, lisinopril , atenolol , atorvastatin , hydrochlorothiazide , spironolactone , allopurinol , and omeprazole -sodium bicarbonate .  Meds ordered this encounter  Medications   tirzepatide  (MOUNJARO ) 12.5 MG/0.5ML Pen    Sig: Inject 12.5 mg into the skin once a week.    Dispense:  6 mL    Refill:  1    Supervising Provider:   ROLLENE NORRIS A [[id number]]       [1]  Allergies Allergen Reactions   Amlodipine  Other (See Comments)    edema   Bystolic  [Nebivolol  Hcl] Other (See  Comments)    Patient stated medication gave him major headaches   "

## 2024-12-26 LAB — FERRITIN: Ferritin: 264.3 ng/mL (ref 22.0–322.0)

## 2024-12-26 LAB — CBC WITH DIFFERENTIAL/PLATELET
Basophils Absolute: 0.1 K/uL (ref 0.0–0.1)
Basophils Relative: 1 % (ref 0.0–3.0)
Eosinophils Absolute: 0.4 K/uL (ref 0.0–0.7)
Eosinophils Relative: 4.4 % (ref 0.0–5.0)
HCT: 39.6 % (ref 39.0–52.0)
Hemoglobin: 13.1 g/dL (ref 13.0–17.0)
Lymphocytes Relative: 39 % (ref 12.0–46.0)
Lymphs Abs: 3.4 K/uL (ref 0.7–4.0)
MCHC: 33.1 g/dL (ref 30.0–36.0)
MCV: 83.9 fl (ref 78.0–100.0)
Monocytes Absolute: 0.6 K/uL (ref 0.1–1.0)
Monocytes Relative: 6.6 % (ref 3.0–12.0)
Neutro Abs: 4.3 K/uL (ref 1.4–7.7)
Neutrophils Relative %: 49 % (ref 43.0–77.0)
Platelets: 369 K/uL (ref 150.0–400.0)
RBC: 4.72 Mil/uL (ref 4.22–5.81)
RDW: 16 % — ABNORMAL HIGH (ref 11.5–15.5)
WBC: 8.7 K/uL (ref 4.0–10.5)

## 2024-12-26 LAB — COMPREHENSIVE METABOLIC PANEL WITH GFR
ALT: 12 U/L (ref 3–53)
AST: 17 U/L (ref 5–37)
Albumin: 4.4 g/dL (ref 3.5–5.2)
Alkaline Phosphatase: 75 U/L (ref 39–117)
BUN: 17 mg/dL (ref 6–23)
CO2: 25 meq/L (ref 19–32)
Calcium: 9.5 mg/dL (ref 8.4–10.5)
Chloride: 103 meq/L (ref 96–112)
Creatinine, Ser: 1.26 mg/dL (ref 0.40–1.50)
GFR: 67.23 mL/min
Glucose, Bld: 81 mg/dL (ref 70–99)
Potassium: 3.8 meq/L (ref 3.5–5.1)
Sodium: 136 meq/L (ref 135–145)
Total Bilirubin: 0.4 mg/dL (ref 0.2–1.2)
Total Protein: 7.7 g/dL (ref 6.0–8.3)

## 2024-12-26 LAB — LIPID PANEL
Cholesterol: 129 mg/dL (ref 28–200)
HDL: 43.7 mg/dL
LDL Cholesterol: 70 mg/dL (ref 10–99)
NonHDL: 85.64
Total CHOL/HDL Ratio: 3
Triglycerides: 77 mg/dL (ref 10.0–149.0)
VLDL: 15.4 mg/dL (ref 0.0–40.0)

## 2024-12-26 LAB — TSH: TSH: 0.8 u[IU]/mL (ref 0.35–5.50)

## 2024-12-26 LAB — HEMOGLOBIN A1C: Hgb A1c MFr Bld: 5.6 % (ref 4.6–6.5)

## 2024-12-26 LAB — URIC ACID: Uric Acid, Serum: 7.3 mg/dL (ref 4.0–7.8)

## 2024-12-27 LAB — FOLATE: Folate: 18.5 ng/mL

## 2024-12-27 LAB — VITAMIN B12: Vitamin B-12: 751 pg/mL (ref 211–911)

## 2024-12-30 ENCOUNTER — Ambulatory Visit: Payer: Self-pay | Admitting: Family Medicine

## 2025-03-28 ENCOUNTER — Ambulatory Visit: Admitting: Internal Medicine

## 2025-04-02 ENCOUNTER — Ambulatory Visit: Admitting: Orthopaedic Surgery
